# Patient Record
Sex: Male | Born: 1935
Health system: Southern US, Community
[De-identification: ages and names within clinical notes are randomized; demographics above are authoritative.]

## PROBLEM LIST (undated history)

## (undated) DIAGNOSIS — R011 Cardiac murmur, unspecified: Secondary | ICD-10-CM

## (undated) DIAGNOSIS — N189 Chronic kidney disease, unspecified: Secondary | ICD-10-CM

## (undated) DIAGNOSIS — I10 Essential (primary) hypertension: Secondary | ICD-10-CM

## (undated) DIAGNOSIS — D649 Anemia, unspecified: Secondary | ICD-10-CM

## (undated) DIAGNOSIS — I509 Heart failure, unspecified: Secondary | ICD-10-CM

## (undated) DIAGNOSIS — K648 Other hemorrhoids: Secondary | ICD-10-CM

## (undated) DIAGNOSIS — H409 Unspecified glaucoma: Secondary | ICD-10-CM

## (undated) DIAGNOSIS — R351 Nocturia: Secondary | ICD-10-CM

## (undated) DIAGNOSIS — I4819 Other persistent atrial fibrillation: Secondary | ICD-10-CM

## (undated) DIAGNOSIS — Z8719 Personal history of other diseases of the digestive system: Secondary | ICD-10-CM

## (undated) DIAGNOSIS — D539 Nutritional anemia, unspecified: Secondary | ICD-10-CM

## (undated) DIAGNOSIS — S83209A Unspecified tear of unspecified meniscus, current injury, unspecified knee, initial encounter: Secondary | ICD-10-CM

## (undated) DIAGNOSIS — E119 Type 2 diabetes mellitus without complications: Secondary | ICD-10-CM

## (undated) DIAGNOSIS — N400 Enlarged prostate without lower urinary tract symptoms: Secondary | ICD-10-CM

## (undated) DIAGNOSIS — K219 Gastro-esophageal reflux disease without esophagitis: Secondary | ICD-10-CM

## (undated) DIAGNOSIS — M25561 Pain in right knee: Secondary | ICD-10-CM

## (undated) DIAGNOSIS — R269 Unspecified abnormalities of gait and mobility: Principal | ICD-10-CM

## (undated) DIAGNOSIS — E785 Hyperlipidemia, unspecified: Secondary | ICD-10-CM

## (undated) DIAGNOSIS — D472 Monoclonal gammopathy: Secondary | ICD-10-CM

## (undated) DIAGNOSIS — M1990 Unspecified osteoarthritis, unspecified site: Secondary | ICD-10-CM

## (undated) DIAGNOSIS — K5790 Diverticulosis of intestine, part unspecified, without perforation or abscess without bleeding: Secondary | ICD-10-CM

## (undated) DIAGNOSIS — K635 Polyp of colon: Secondary | ICD-10-CM

## (undated) HISTORY — DX: Unspecified glaucoma: H40.9

## (undated) HISTORY — PX: HEMORRHOID SURGERY: SHX153

## (undated) HISTORY — DX: Unspecified abnormalities of gait and mobility: R26.9

## (undated) HISTORY — DX: Polyp of colon: K63.5

## (undated) HISTORY — DX: Cardiac murmur, unspecified: R01.1

## (undated) HISTORY — PX: HERNIA REPAIR: SHX51

## (undated) HISTORY — DX: Hyperlipidemia, unspecified: E78.5

---

## 2003-04-08 ENCOUNTER — Encounter: Admission: RE | Admit: 2003-04-08 | Discharge: 2003-04-08 | Payer: Self-pay | Admitting: Internal Medicine

## 2006-01-01 ENCOUNTER — Ambulatory Visit: Payer: Self-pay | Admitting: Internal Medicine

## 2006-01-24 ENCOUNTER — Ambulatory Visit: Payer: Self-pay | Admitting: Internal Medicine

## 2006-01-24 ENCOUNTER — Encounter (INDEPENDENT_AMBULATORY_CARE_PROVIDER_SITE_OTHER): Payer: Self-pay | Admitting: Specialist

## 2006-02-27 ENCOUNTER — Ambulatory Visit: Payer: Self-pay | Admitting: Internal Medicine

## 2006-12-27 ENCOUNTER — Ambulatory Visit (HOSPITAL_COMMUNITY): Admission: RE | Admit: 2006-12-27 | Discharge: 2006-12-27 | Payer: Self-pay | Admitting: Internal Medicine

## 2009-12-25 ENCOUNTER — Emergency Department (HOSPITAL_COMMUNITY)
Admission: EM | Admit: 2009-12-25 | Discharge: 2009-12-25 | Payer: Self-pay | Source: Home / Self Care | Admitting: Emergency Medicine

## 2010-04-02 HISTORY — PX: CATARACT EXTRACTION W/ INTRAOCULAR LENS  IMPLANT, BILATERAL: SHX1307

## 2010-08-18 NOTE — Assessment & Plan Note (Signed)
[place] OFFICE NOTE   Adam [name], Adam [name]                        MRN:          [id number]  DATE:02/27/2006                            DOB:          04-Feb-1931    HISTORY:  Adam [name] presents today for a followup.  He was evaluated in  the office January 01, 2006 for dysphagia and a personal history of colon  polyps.  See that dictation for details.  On January 24, 2006, he  underwent colonoscopy and upper endoscopy.  Colonoscopy revealed left  sided diverticulosis, small internal hemorrhoids, and a diminutive left  sided colon polyp which was removed and found to be hyperplastic.  Upper  endoscopy revealed a dense stricture of the distal esophagus secondary  to reflux disease, sliding hiatal hernia, and duodenal bulb erythema.  Biopsies for Helicobacter pylori testing were negative.  The esophageal  stricture was dilated to a maximal diameter of 15 mm.  He was prescribed  Prilosec 20 mg daily.  He took the medication for 2 weeks.  Discontinued  thereafter due to label recommendations.  He presents now for followup.  Since his procedure, he reports doing well.  Absolutely no dysphagia.  We did recommend repeat endoscopy to dilate his esophagus to a larger  diameter in order to improve his opportunity for a dysphagia-free  interval.  During today's office visit we discussed in great detail his  procedure results.  As well, treatment plans and recommendations.  He  has specific questions regarding his hemorrhoids, need for acid  suppressive medication, type of acid suppression medication and options,  and the need for repeat endoscopy.   ALLERGIES:  He has no known drug allergies.   MEDICATIONS:  Remain lisinopril, hydrochlorothiazide, Zocor, Mobic,  Atenolol, Simvastatin, vitamin C, Ocuvite, glucosamine, flax seed oil,  and fish oil.   PHYSICAL EXAMINATION:  Well-appearing male in no acute distress.   Blood  pressure is 110/62, heart rate 60 and regular, weight is 161 pounds.  His abdomen was not reexamined.   IMPRESSION:  1. Gastroesophageal reflux disease complicated by peptic stricture.      Currently asymptomatic post dilation.  2. Dimunitive hyperplastic colon polyp.  3. Incidental diverticulosis.  4. Internal hemorrhoids.   RECOMMENDATIONS:  1. Resume daily proton pump inhibitor therapy.  I have given him a      prescription for Nexium with multiple refills.  As well, a list of      multiple alternative proton pump inhibitors should he find one that      is more cost effective based on his insurance plan.  2. The patient has elected to forego esophageal dilation at this time.      Thus, repeat endoscopy with esophageal dilation as needed for      recurrent dysphagia.  3. Reflux precautions.  4. Daily fiber supplement for hemorrhoids.  5. Repeat colonoscopy for problems only, as his polyp was      hyperplastic.  6. Resume general medical care with Dr. Virgina Jock.     Docia Chuck. Henrene Pastor, MD  Electronically Signed    JNP/MedQ  DD: 02/27/2006  DT: 02/27/2006  Job #: [id number]   cc:   Adam Reel, MD

## 2010-08-18 NOTE — Assessment & Plan Note (Signed)
[place] OFFICE NOTE   Adam [name], SANCHES                        MRN:          [id number]  DATE:01/01/2006                            DOB:          06/13/1930    REASON FOR CONSULTATION:  1. Dysphagia.  2. Personal history of colon polyps, overdue for surveillance.   HISTORY OF PRESENT ILLNESS:  This is a pleasant 77 year old white male with  a history of hypertension, hyperlipidemia and diet controlled diabetes  mellitus.  He is referred through the courtesy of Dr. Virgina Jock regarding  dysphagia and a personal history of colon polyps.  First, the patient  reports a several year history of intermittent solid food dysphagia.  Occurrences are not predictable, though have recurred with stable frequency  of once every other week.  In years gone by, he did have significant  problems with heartburn and indigestion.  More recently, his symptoms are  controlled with an improved diet and occasional Zantac.  He has not had  prior upper endoscopy.  Next, her reports a personal history of colon polyps  when he underwent his initial screening exam in April of 1999 while living  in [place].  He reports to me that he had several polyps, and that it was  recommended that he undergo colonoscopy in a few years.  The patient readily  admits that he is overdue for the exam.  Outside records are not available  for review.  He had no active lower GI complaints such as abdominal pain,  change in his bowel habits or bleeding.  He does have hemorrhoids which he  notices though states they are not problematic.   PAST MEDICAL HISTORY:  1. Hypertension.  2. Hyperlipidemia.  3. Glucose intolerance.  4. Questionable remote history of sarcoidosis.  5. History of colon polyps.   PAST SURGICAL HISTORY:  1. Hernia repair.  2. Hemorrhoidectomy.   ALLERGIES:  No known drug allergies.   MEDICATIONS:  1. Lisinopril 40 mg daily.  2.  Hydrochlorothiazide 25 mg daily.  3. Zocor 40 mg daily.  4. Mobic 7.5 mg daily.  5. Atenolol 25 mg daily.  6. Simvastatin 40 mg daily.  7. Vitamin C 1 g daily.  8. Ocuvite daily.  9. Glucosamine daily.  10.Flax seed oil.  11.Fish oil.  12.Nystatin ointment p.r.n.   FAMILY HISTORY:  No family history of colon cancer or colon polyps.  Father  and brother with heart disease.  Brother with diabetes.   SOCIAL HISTORY:  The patient is married with two daughters and lives with  his wife. 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  He retired at a level of  06.  He does not smoke and has a couple of alcoholic beverages each evening.   REVIEW OF SYSTEMS:  Per diagnostic evaluation form.   PHYSICAL EXAMINATION:  GENERAL APPEARANCE:  Well-appearing male in no acute  distress.  VITAL SIGNS:  Blood pressure 120/70, heart rate 68 and regular, weight 162  pounds, height 5 feet 5 inches.  HEENT:  Sclerae anicteric.  Conjunctivae pink.  Oral  mucosa intact.  No  adenopathy.  LUNGS:  Clear.  HEART:  Regular.  ABDOMEN:  Soft without tenderness mass or hernia.  Good bowel sounds heard.  EXTREMITIES:  Without edema.   IMPRESSION:  1. Several year history of stable intermittent solid food dysphagia.  This      on a background of reflux symptoms, most likely peptic stricture.  2. Personal history of colon polyps in 1999.  Presumably overdue for      surveillance.  No active symptoms at present.   RECOMMENDATIONS:  1. Upper endoscopy with possible esophageal dilation.  Petra Kuba of the      procedure as well as risks, benefits, alternatives were reviewed.  He      understood and agreed to proceed.  2. Consider proton pump inhibitor therapy if reflux sound is a cause for      dysphagia.  3. Schedule colonoscopy with polypectomy if necessary.  Petra Kuba of the      procedure as well as      risks, benefits, alternatives were discussed in detail.  Again, he      understood and agreed to proceed.            ______________________________  Docia Chuck. Geri Seminole., MD    JNP/MedQ  DD:  01/01/2006 DT:  01/02/2006 Job #:  [id number]   cc:   Precious Reel, MD

## 2011-01-24 ENCOUNTER — Encounter: Payer: Self-pay | Admitting: Internal Medicine

## 2011-04-05 DIAGNOSIS — M109 Gout, unspecified: Secondary | ICD-10-CM | POA: Diagnosis not present

## 2011-06-06 DIAGNOSIS — I10 Essential (primary) hypertension: Secondary | ICD-10-CM | POA: Diagnosis not present

## 2011-06-06 DIAGNOSIS — E1129 Type 2 diabetes mellitus with other diabetic kidney complication: Secondary | ICD-10-CM | POA: Diagnosis not present

## 2011-06-06 DIAGNOSIS — M109 Gout, unspecified: Secondary | ICD-10-CM | POA: Diagnosis not present

## 2011-09-07 DIAGNOSIS — E785 Hyperlipidemia, unspecified: Secondary | ICD-10-CM | POA: Diagnosis not present

## 2011-09-07 DIAGNOSIS — M109 Gout, unspecified: Secondary | ICD-10-CM | POA: Diagnosis not present

## 2011-09-07 DIAGNOSIS — Z125 Encounter for screening for malignant neoplasm of prostate: Secondary | ICD-10-CM | POA: Diagnosis not present

## 2011-09-07 DIAGNOSIS — I10 Essential (primary) hypertension: Secondary | ICD-10-CM | POA: Diagnosis not present

## 2011-09-07 DIAGNOSIS — E1129 Type 2 diabetes mellitus with other diabetic kidney complication: Secondary | ICD-10-CM | POA: Diagnosis not present

## 2011-09-07 DIAGNOSIS — D649 Anemia, unspecified: Secondary | ICD-10-CM | POA: Diagnosis not present

## 2011-09-18 DIAGNOSIS — E119 Type 2 diabetes mellitus without complications: Secondary | ICD-10-CM | POA: Diagnosis not present

## 2011-09-18 DIAGNOSIS — H3530 Unspecified macular degeneration: Secondary | ICD-10-CM | POA: Diagnosis not present

## 2011-09-18 DIAGNOSIS — Z961 Presence of intraocular lens: Secondary | ICD-10-CM | POA: Diagnosis not present

## 2011-09-21 DIAGNOSIS — Z125 Encounter for screening for malignant neoplasm of prostate: Secondary | ICD-10-CM | POA: Diagnosis not present

## 2011-09-21 DIAGNOSIS — N183 Chronic kidney disease, stage 3 unspecified: Secondary | ICD-10-CM | POA: Diagnosis not present

## 2011-09-21 DIAGNOSIS — E785 Hyperlipidemia, unspecified: Secondary | ICD-10-CM | POA: Diagnosis not present

## 2011-09-21 DIAGNOSIS — E119 Type 2 diabetes mellitus without complications: Secondary | ICD-10-CM | POA: Diagnosis not present

## 2011-09-21 DIAGNOSIS — Z0000 Encounter for general adult medical examination without abnormal findings: Secondary | ICD-10-CM | POA: Diagnosis not present

## 2011-09-21 DIAGNOSIS — R7989 Other specified abnormal findings of blood chemistry: Secondary | ICD-10-CM | POA: Diagnosis not present

## 2011-09-26 DIAGNOSIS — I10 Essential (primary) hypertension: Secondary | ICD-10-CM | POA: Diagnosis not present

## 2011-09-26 DIAGNOSIS — E875 Hyperkalemia: Secondary | ICD-10-CM | POA: Diagnosis not present

## 2011-09-26 DIAGNOSIS — M549 Dorsalgia, unspecified: Secondary | ICD-10-CM | POA: Diagnosis not present

## 2011-09-26 DIAGNOSIS — N183 Chronic kidney disease, stage 3 unspecified: Secondary | ICD-10-CM | POA: Diagnosis not present

## 2011-10-02 ENCOUNTER — Telehealth: Payer: Self-pay | Admitting: Oncology

## 2011-10-02 NOTE — Telephone Encounter (Signed)
S/w pt wife re appt for 7/16 @ 1:30 pm.

## 2011-10-08 ENCOUNTER — Telehealth: Payer: Self-pay | Admitting: Oncology

## 2011-10-08 NOTE — Telephone Encounter (Signed)
Referred by Dr. Shon Baton Dx-Monoclonal Gammopathy

## 2011-10-12 ENCOUNTER — Other Ambulatory Visit: Payer: Self-pay | Admitting: Oncology

## 2011-10-12 DIAGNOSIS — D729 Disorder of white blood cells, unspecified: Secondary | ICD-10-CM

## 2011-10-12 DIAGNOSIS — D649 Anemia, unspecified: Secondary | ICD-10-CM

## 2011-10-16 ENCOUNTER — Ambulatory Visit (HOSPITAL_COMMUNITY)
Admission: RE | Admit: 2011-10-16 | Discharge: 2011-10-16 | Disposition: A | Discharge status: Home / Self Care | Payer: Medicare Other | Source: Ambulatory Visit | Attending: Oncology | Admitting: Oncology

## 2011-10-16 ENCOUNTER — Telehealth: Payer: Self-pay | Admitting: Oncology

## 2011-10-16 ENCOUNTER — Other Ambulatory Visit (HOSPITAL_BASED_OUTPATIENT_CLINIC_OR_DEPARTMENT_OTHER): Payer: Medicare Other | Admitting: Lab

## 2011-10-16 ENCOUNTER — Encounter: Payer: Self-pay | Admitting: Oncology

## 2011-10-16 ENCOUNTER — Ambulatory Visit: Payer: Medicare Other

## 2011-10-16 ENCOUNTER — Ambulatory Visit (HOSPITAL_BASED_OUTPATIENT_CLINIC_OR_DEPARTMENT_OTHER): Payer: Medicare Other | Admitting: Oncology

## 2011-10-16 VITALS — BP 143/60 | HR 69 | Temp 97.7°F | Ht 64.5 in | Wt 152.1 lb

## 2011-10-16 DIAGNOSIS — D649 Anemia, unspecified: Secondary | ICD-10-CM | POA: Diagnosis not present

## 2011-10-16 DIAGNOSIS — M47812 Spondylosis without myelopathy or radiculopathy, cervical region: Secondary | ICD-10-CM | POA: Diagnosis not present

## 2011-10-16 DIAGNOSIS — C9000 Multiple myeloma not having achieved remission: Secondary | ICD-10-CM | POA: Diagnosis not present

## 2011-10-16 DIAGNOSIS — D472 Monoclonal gammopathy: Secondary | ICD-10-CM

## 2011-10-16 DIAGNOSIS — Z8739 Personal history of other diseases of the musculoskeletal system and connective tissue: Secondary | ICD-10-CM | POA: Diagnosis not present

## 2011-10-16 DIAGNOSIS — D729 Disorder of white blood cells, unspecified: Secondary | ICD-10-CM

## 2011-10-16 LAB — CBC WITH DIFFERENTIAL/PLATELET
BASO%: 0.1 % (ref 0.0–2.0)
Basophils Absolute: 0 10*3/uL (ref 0.0–0.1)
EOS%: 3.7 % (ref 0.0–7.0)
Eosinophils Absolute: 0.1 10*3/uL (ref 0.0–0.5)
HCT: 30 % — ABNORMAL LOW (ref 38.4–49.9)
HGB: 10.1 g/dL — ABNORMAL LOW (ref 13.0–17.1)
LYMPH%: 24.7 % (ref 14.0–49.0)
MCH: 34.9 pg — ABNORMAL HIGH (ref 27.2–33.4)
MCHC: 33.6 g/dL (ref 32.0–36.0)
MCV: 103.9 fL — ABNORMAL HIGH (ref 79.3–98.0)
MONO#: 0.5 10*3/uL (ref 0.1–0.9)
MONO%: 13 % (ref 0.0–14.0)
NEUT#: 2.3 10*3/uL (ref 1.5–6.5)
NEUT%: 58.5 % (ref 39.0–75.0)
Platelets: 178 10*3/uL (ref 140–400)
RBC: 2.88 10*6/uL — ABNORMAL LOW (ref 4.20–5.82)
RDW: 13.3 % (ref 11.0–14.6)
WBC: 3.9 10*3/uL — ABNORMAL LOW (ref 4.0–10.3)
lymph#: 1 10*3/uL (ref 0.9–3.3)

## 2011-10-16 LAB — CHCC SMEAR

## 2011-10-16 NOTE — Progress Notes (Signed)
CC:   Adam Reel, MD  REASON FOR CONSULTATION:  Evaluate for plasma cell disorder.  HISTORY OF PRESENT ILLNESS:  A 78 year old gentleman, native of [place], lived in multiple locations in the past as part of his career in the [employer].  He relocated to [place] 9 years ago from [place] to live close to his daughter.  He is a gentleman with a past medical history significant for severe gout.  He has also A history of chronic renal insufficiency and mild anemia.  At 1 point, he was followed by Hematology/Oncology by Adam [name] at the [facility], phone number [phone], for what appears to be a plasma cell disorder.  At that time, he was followed by laboratory blood work.  He does not recall having a bone marrow biopsy and basically he was told that he did not have a multiple myeloma but needs to be followed up.  The patient has not really followed with them or with anybody, from that matter, since his relocation to [place].  He has been monitored closely by Adam [name], who recently repeated his protein studies among other laboratory data.  On 06/21 his hemoglobin was 9.2, his platelet count was 223, and his white cell count of 4.3.  So electrolytes are normal.  His potassium is 5.5.  His creatinine was 1.8.  His protein studies at that time showed that he did not really have an M spike detected; however, immunofixation showed an IgM kappa light chain.  His IgG, IgA and IgM quantitative immunoglobulins are all within normal range.  Iron level was normal at 112.  His [id number] and folic acid within normal range as well. For that reason, the patient was referred to me for evaluation for possible plasma cell disorder and recent worsening anemia.  Clinically Adam [name] is asymptomatic.  He does not report any chest pain.  He does not report any difficulty breathing.  He had reported some fatigue at times.  He had not reported any back pain.  Did not report any shoulder pain.   He had not had any thrombotic events.  He had not had any major changes in his performance status or activity level.  He is still quite active, performing again most activities of daily living without any hindrance or decline.  REVIEW OF SYSTEMS:  Does not report any headaches, blurry vision, double vision.  Does not report motor or sensory neuropathy.  Did not report any alteration in mental status.  Did not report any psychiatric issues, depression.  Did not report any fever, chills, sweats.  Did not report any cough, hemoptysis, hematemesis.  No nausea, vomiting.  Did not report any abdominal pain.  No hematochezia, melena, genitourinary complaints.  Rest of the review of systems unremarkable.  PAST MEDICAL HISTORY:  Significant for gout.  He has history of questionable plasma cell disorder, history of hyperlipidemia, hypertension, macular degeneration, arthritis and also status post cataract surgery.  He has also diabetes but diet control at this time.  MEDICATION:  He is on lisinopril, labetalol, Benicar, spironolactone, MiraLAX, Colcrys, simvastatin, Nexium, Avodart.  He takes also over-the- counter fish oil, vitamin C, Ocuvite and glucosamine/chondroitin.  ALLERGIES:  None.  FAMILY HISTORY:  His 1 sister had multiple myeloma and died 24 years ago.  Mother lived to the 14s.  Father had heart problems.  SOCIAL HISTORY:  He is married.  He has 2 daughters.  Quit smoking in the 1960s.  Drinks about 2 drinks a night, done  so for the last 25 years.  He had a career in [employer], also was a Network engineer.  PHYSICAL EXAMINATION:  An alert, awake gentleman, appeared in no active distress.  His blood pressure is 143/60, pulse 69, respirations 20, and temperature is 97.7.  Head:  Normocephalic, atraumatic.  Pupils equal and round, reactive to light.  Oral mucosa moist and pink.  Neck: Supple.  No lymphadenopathy.  Heart:  Regular rate and rhythm.  S1 and S2.  Lungs:  Clear  to auscultation.  No rhonchi, wheeze or dullness to percussion.  Abdomen:  Soft, nontender.  No hepatosplenomegaly. Extremities:  No clubbing, cyanosis, or edema.  Neurologic:  Intact motor sensory and deep tendon reflexes.  LABORATORY DATA:  Hemoglobin of 10.1, white cell count is 3.9, platelet count of 178, MCV is 103.9.  Peripheral smear was personally reviewed today.  His red cells appeared normal.  Could not appreciate any schistocytosis or red cell fragmentation.  White cells appeared normal. No evidence of any hypogranulation or dysplasia.  ASSESSMENT AND PLAN:  A 78 year old gentleman with the following issues: 1. Plasma cell disorder.  He does have an element of a monoclonal     protein, although his M spike is really very small until only     detected by immunofixation.  His quantitative immunoglobulin is low     normal but definitely has an IgM monoclonal protein with a kappa     light chain restriction.  Differential diagnosis was discussed     today with Adam [name].  That would include monoclonal gammopathy of     undetermined significance, IgM multiple myeloma is extremely rare     but certainly a possibility, and lastly a lymphoproliferative     disorder.  A number of conditions such as non-Hodgkin lymphoma,     like a Waldenstrom macroglobulinemia could have an isolated IgM.     Overall, I do agree with Adam [name] that I think it is less likely a     plasma cell disorder or lymphoproliferative disorder.  At this     time, I think his M spike and the fact that he has completely     normal IgM makes me think less of a neoplasm causing that IgM     monoclonal protein.  It could also be an element of a reactive     monoclonal gammopathy. 2. Anemia.  His hemoglobin is 10, lower limit of normal is 13, with     macrocytosis.  Differential diagnosis associated with that, could     have anemia of renal disease due to his chronic renal     insufficiency.  Could also have an  element of myelodysplastic     syndrome.  Also this could be a function of a lymphoproliferative     disorder that is causing his IgM monoclonal protein.  The workup at     this point:  We will obtain a skeletal survey to rule out a     multiple myeloma and we will obtain some light chains as well.  I     have also discussed with him the possible need for a bone marrow     biopsy if we cannot really determine exactly why he has microcytic     anemia.  The odds are he could have an element of myelodysplastic     syndrome that might require growth factor support in the near     future.  I will ask Mr.  Goodpasture to return back to me after we have     done these studies.    ______________________________ Wyatt Portela, M.D. FNS/MEDQ  D:  10/16/2011  T:  10/16/2011  Job:  [id number]

## 2011-10-16 NOTE — Progress Notes (Signed)
Note dictated

## 2011-10-16 NOTE — Telephone Encounter (Signed)
Gave pt appt for August 1st MD visit only, sent pt for Bone survey today

## 2011-10-16 NOTE — Progress Notes (Signed)
Patient came in today as a new patient and he has two insurance,he said that he was [place] as far as financial assistance.

## 2011-10-18 LAB — COMPREHENSIVE METABOLIC PANEL
ALT: 40 U/L (ref 0–53)
AST: 39 U/L — ABNORMAL HIGH (ref 0–37)
Albumin: 4.4 g/dL (ref 3.5–5.2)
Alkaline Phosphatase: 61 U/L (ref 39–117)
BUN: 50 mg/dL — ABNORMAL HIGH (ref 6–23)
CO2: 20 mEq/L (ref 19–32)
Calcium: 9.3 mg/dL (ref 8.4–10.5)
Chloride: 108 mEq/L (ref 96–112)
Creatinine, Ser: 1.95 mg/dL — ABNORMAL HIGH (ref 0.50–1.35)
Glucose, Bld: 116 mg/dL — ABNORMAL HIGH (ref 70–99)
Potassium: 6.2 mEq/L — ABNORMAL HIGH (ref 3.5–5.3)
Sodium: 136 mEq/L (ref 135–145)
Total Bilirubin: 0.4 mg/dL (ref 0.3–1.2)
Total Protein: 6.5 g/dL (ref 6.0–8.3)

## 2011-10-18 LAB — SPEP & IFE WITH QIG
Albumin ELP: 62.6 % (ref 55.8–66.1)
Alpha-1-Globulin: 4 % (ref 2.9–4.9)
Alpha-2-Globulin: 10.2 % (ref 7.1–11.8)
Beta 2: 4.6 % (ref 3.2–6.5)
Beta Globulin: 6.6 % (ref 4.7–7.2)
Gamma Globulin: 12 % (ref 11.1–18.8)
IgA: 272 mg/dL (ref 68–379)
IgG (Immunoglobin G), Serum: 858 mg/dL (ref 650–1600)
IgM, Serum: 184 mg/dL (ref 41–251)
Total Protein, Serum Electrophoresis: 6.5 g/dL (ref 6.0–8.3)

## 2011-10-18 LAB — KAPPA/LAMBDA LIGHT CHAINS
Kappa free light chain: 6.01 mg/dL — ABNORMAL HIGH (ref 0.33–1.94)
Kappa:Lambda Ratio: 1.88 — ABNORMAL HIGH (ref 0.26–1.65)
Lambda Free Lght Chn: 3.2 mg/dL — ABNORMAL HIGH (ref 0.57–2.63)

## 2011-10-18 LAB — ERYTHROPOIETIN: Erythropoietin: 4.1 m[IU]/mL (ref 2.6–34.0)

## 2011-11-01 ENCOUNTER — Ambulatory Visit (HOSPITAL_BASED_OUTPATIENT_CLINIC_OR_DEPARTMENT_OTHER): Payer: Medicare Other | Admitting: Oncology

## 2011-11-01 ENCOUNTER — Telehealth: Payer: Self-pay | Admitting: Oncology

## 2011-11-01 VITALS — BP 144/64 | HR 61 | Temp 97.0°F | Ht 65.4 in | Wt 152.6 lb

## 2011-11-01 DIAGNOSIS — D539 Nutritional anemia, unspecified: Secondary | ICD-10-CM | POA: Diagnosis not present

## 2011-11-01 DIAGNOSIS — D472 Monoclonal gammopathy: Secondary | ICD-10-CM

## 2011-11-01 DIAGNOSIS — D729 Disorder of white blood cells, unspecified: Secondary | ICD-10-CM

## 2011-11-01 NOTE — Progress Notes (Signed)
Hematology and Oncology Follow Up Visit  Adam [name] [id number] [birth year] 78 y.o. 11/01/2011 4:46 PM    Principle Diagnosis: 78 year old with anemia and IgM monoclonal gammopathy. Likely due to MDS vs possible lymphoproliferative disorder.   Current therapy: observation and surveillance.   Interim History:  Adam [name] presents today for a follow up visit. His a pleasant man I saw on 7/16 for anemia and IgM M spike. He is asymptomatic at this point. He does not report any chest pain. He does not report any difficulty breathing. He had reported some fatigue at times. He had not reported any back pain. Did not report any shoulder pain. He had not had any thrombotic events. He had not had any major changes in his performance status or activity level. He is still quite active, performing again most activities of daily living without any hindrance or decline.   Medications: I have reviewed the patient's current medications. Current outpatient prescriptions:Ascorbic Acid (VITAMIN C) 1000 MG tablet, Take 1,000 mg by mouth daily., Disp: , Rfl: ;  beta carotene w/minerals (OCUVITE) tablet, Take 1 tablet by mouth daily., Disp: , Rfl: ;  colchicine (COLCRYS) 0.6 MG tablet, Take 0.6 mg by mouth daily., Disp: , Rfl: ;  dutasteride (AVODART) 0.5 MG capsule, Take 0.5 mg by mouth daily., Disp: , Rfl:  esomeprazole (NEXIUM) 40 MG capsule, Take 40 mg by mouth daily before breakfast., Disp: , Rfl: ;  Febuxostat (ULORIC) 80 MG TABS, Take 80 mg by mouth daily., Disp: , Rfl: ;  Flaxseed, Linseed, (FLAXSEED OIL) 1000 MG CAPS, Take 2,000 mg by mouth daily., Disp: , Rfl: ;  glucosamine-chondroitin 500-400 MG tablet, Take 1 tablet by mouth daily., Disp: , Rfl: ;  labetalol (NORMODYNE) 100 MG tablet, Take 100 mg by mouth 2 (two) times daily., Disp: , Rfl:  lisinopril (PRINIVIL,ZESTRIL) 40 MG tablet, Take 40 mg by mouth daily., Disp: , Rfl: ;  olmesartan (BENICAR) 40 MG tablet, Take 40 mg by mouth daily., Disp: , Rfl: ;  Omega-3  Fatty Acids (FISH OIL) 1000 MG CAPS, Take 1,000 mg by mouth 3 (three) times daily., Disp: , Rfl: ;  simvastatin (ZOCOR) 40 MG tablet, Take 40 mg by mouth every evening., Disp: , Rfl: ;  spironolactone (ALDACTONE) 25 MG tablet, Take 25 mg by mouth daily., Disp: , Rfl:   Allergies: No Known Allergies  Past Medical History, Surgical history, Social history, and Family History were reviewed and updated.  Review of Systems: Constitutional:  Negative for fever, chills, night sweats, anorexia, weight loss, pain. Cardiovascular: no chest pain or dyspnea on exertion Respiratory: negative Neurological: negative Dermatological: negative ENT: negative Skin: Negative. Gastrointestinal: negative Genito-Urinary: negative Hematological and Lymphatic: negative Breast: negative Musculoskeletal: negative Remaining ROS negative. Physical Exam: Blood pressure 144/64, pulse 61, temperature 97 F (36.1 C), temperature source Oral, height 5' 5.4" (1.661 m), weight 152 lb 9.6 oz (69.219 kg). ECOG: 1 General appearance: alert Head: Normocephalic, without obvious abnormality, atraumatic Neck: no adenopathy, no carotid bruit, no JVD, supple, symmetrical, trachea midline and thyroid not enlarged, symmetric, no tenderness/mass/nodules Lymph nodes: Cervical, supraclavicular, and axillary nodes normal. Heart:regular rate and rhythm, S1, S2 normal, no murmur, click, rub or gallop Lung:chest clear, no wheezing, rales, normal symmetric air entry Abdomin: soft, non-tender, without masses or organomegaly EXT:no erythema, induration, or nodules   Lab Results: Lab Results  Component Value Date   WBC 3.9* 10/16/2011   HGB 10.1* 10/16/2011   HCT 30.0* 10/16/2011   MCV 103.9* 10/16/2011  PLT 178 10/16/2011     Chemistry      Component Value Date/Time   NA 136 10/16/2011 1336   K 6.2* 10/16/2011 1336   CL 108 10/16/2011 1336   CO2 20 10/16/2011 1336   BUN 50* 10/16/2011 1336   CREATININE 1.95* 10/16/2011 1336        Component Value Date/Time   CALCIUM 9.3 10/16/2011 1336   ALKPHOS 61 10/16/2011 1336   AST 39* 10/16/2011 1336   ALT 40 10/16/2011 1336   BILITOT 0.4 10/16/2011 1336       Radiological Studies: METASTATIC BONE SURVEY  Comparison: None.  Findings: Lateral view of the skull demonstrates no focal lytic  lesion. AP and lateral views of the cervical spine demonstrate C4-  C6 to be borderline decreased in height. Favored to be within  normal variation. No lytic lesions. Expected for age spondylosis,  including at C5-C6 and C6-C7.  Swimmer's view demonstrates no findings at the cervical thoracic  junction or upper thoracic spine. AP and lateral views of the  thoracic spine demonstrate minimal S-shaped curvature.  Approximately the bottom of T3 through the bottom of L2 are imaged  on the lateral. This demonstrates spondylosis. Borderline mid  thoracic vertebral body height, including T8-T9. Also favored to  be within normal variation. No focal lesions. Spondylosis which  is most marked at T10-T12.  AP and lateral views of the lumbar spine demonstrate five lumbar-  type vertebral bodies. Vascular calcifications. Surgical clips  projecting over the left hip. Possible right renal calculus.  Maintenance of lumbar vertebral body height, without focal lytic  lesion.  AP view pelvis demonstrates no focal lytic lesion.  Vascular calcifications in the femoral distribution bilaterally.  AP views of the bilateral femurs demonstrate no focal lytic lesion.  AP views of the bilateral shoulders demonstrate bilateral  acromioclavicular joint osteoarthritis. No focal lytic lesion.  AP views of the bilateral humeri demonstrate no focal lytic lesion.  Minimal enthesopathic change at the medial epicondyle of the right  humerus.  AP views of the distal femurs demonstrate no focal lytic lesion.  Chondrocalcinosis identified within the menisci. Minimal.  IMPRESSION:  1. No focal lesions to suggest multiple  myeloma.  2. Subtle chondrocalcinosis within the menisci. This suggests  calcium pyrophosphate deposition disease.    Impression and Plan: 78 year old gentleman with the following issues:  1. Possible Plasma cell disorder. He does have an element of a monoclonal protein, although his M spike is really very small until only  detected by immunofixation. His quantitative immunoglobulins are normal.  Differential diagnosis  monoclonal gammopathy of undetermined significance, IgM multiple myeloma is extremely rare with a negative skeletal survey and lymphoproliferative like  Waldenstrom macroglobulinemia. For the time being, given his lack of symptoms, I will continue to observe.   2. Anemia. Differential diagnosis anemia of renal disease due to his chronic renal  insufficiency. Could also have an element of myelodysplastic syndrome. Also this could be a function of a lymphoproliferative  disorder that is causing his IgM monoclonal protein.  I have also discussed with him the possible need for a bone marrow  biopsy if we cannot really determine exactly why he has macrocytic anemia. The odds are he could have an element of myelodysplastic  syndrome that might require growth factor support in the near  future.  Mr. Rodrick elected to defer that to a future date. He is agreeable to proceed if his counts drop further or he becomes symptomatic.  I will repeat  his counts in 3 months.      Zola Button, MD 8/1/20134:46 PM

## 2011-11-01 NOTE — Telephone Encounter (Signed)
Gave pt appt for November 2013 lab and MD \

## 2011-11-26 DIAGNOSIS — M25569 Pain in unspecified knee: Secondary | ICD-10-CM | POA: Diagnosis not present

## 2011-11-26 DIAGNOSIS — M1A00X0 Idiopathic chronic gout, unspecified site, without tophus (tophi): Secondary | ICD-10-CM | POA: Diagnosis not present

## 2011-11-26 DIAGNOSIS — M1710 Unilateral primary osteoarthritis, unspecified knee: Secondary | ICD-10-CM | POA: Diagnosis not present

## 2011-11-29 DIAGNOSIS — M1710 Unilateral primary osteoarthritis, unspecified knee: Secondary | ICD-10-CM | POA: Diagnosis not present

## 2011-11-29 DIAGNOSIS — M25569 Pain in unspecified knee: Secondary | ICD-10-CM | POA: Diagnosis not present

## 2011-11-29 DIAGNOSIS — M1A00X0 Idiopathic chronic gout, unspecified site, without tophus (tophi): Secondary | ICD-10-CM | POA: Diagnosis not present

## 2011-12-05 DIAGNOSIS — M1710 Unilateral primary osteoarthritis, unspecified knee: Secondary | ICD-10-CM | POA: Diagnosis not present

## 2011-12-05 DIAGNOSIS — M25569 Pain in unspecified knee: Secondary | ICD-10-CM | POA: Diagnosis not present

## 2011-12-05 DIAGNOSIS — M1A00X0 Idiopathic chronic gout, unspecified site, without tophus (tophi): Secondary | ICD-10-CM | POA: Diagnosis not present

## 2011-12-08 DIAGNOSIS — M1710 Unilateral primary osteoarthritis, unspecified knee: Secondary | ICD-10-CM | POA: Diagnosis not present

## 2011-12-11 DIAGNOSIS — Z23 Encounter for immunization: Secondary | ICD-10-CM | POA: Diagnosis not present

## 2011-12-13 DIAGNOSIS — M1710 Unilateral primary osteoarthritis, unspecified knee: Secondary | ICD-10-CM | POA: Diagnosis not present

## 2011-12-18 DIAGNOSIS — M1710 Unilateral primary osteoarthritis, unspecified knee: Secondary | ICD-10-CM | POA: Diagnosis not present

## 2011-12-20 ENCOUNTER — Encounter (HOSPITAL_BASED_OUTPATIENT_CLINIC_OR_DEPARTMENT_OTHER): Payer: Self-pay | Admitting: *Deleted

## 2011-12-20 NOTE — Progress Notes (Signed)
NPO AFTER MN. ARRIVES AT 1030. NEEDS EKG. PT TO GET LAB DONE Friday 12-21-2011 OR Monday 12-24-2011. WILL TAKE LABETOLOL AM OF SURG W/ SIP OF WATER.

## 2011-12-21 DIAGNOSIS — IMO0002 Reserved for concepts with insufficient information to code with codable children: Secondary | ICD-10-CM | POA: Diagnosis not present

## 2011-12-21 DIAGNOSIS — M1120 Other chondrocalcinosis, unspecified site: Secondary | ICD-10-CM | POA: Diagnosis not present

## 2011-12-21 DIAGNOSIS — I10 Essential (primary) hypertension: Secondary | ICD-10-CM | POA: Diagnosis not present

## 2011-12-21 DIAGNOSIS — S83289A Other tear of lateral meniscus, current injury, unspecified knee, initial encounter: Secondary | ICD-10-CM | POA: Diagnosis not present

## 2011-12-21 DIAGNOSIS — Z79899 Other long term (current) drug therapy: Secondary | ICD-10-CM | POA: Diagnosis not present

## 2011-12-21 DIAGNOSIS — E119 Type 2 diabetes mellitus without complications: Secondary | ICD-10-CM | POA: Diagnosis not present

## 2011-12-21 DIAGNOSIS — E7800 Pure hypercholesterolemia, unspecified: Secondary | ICD-10-CM | POA: Diagnosis not present

## 2011-12-21 LAB — CBC
HCT: 30 % — ABNORMAL LOW (ref 39.0–52.0)
Hemoglobin: 10.5 g/dL — ABNORMAL LOW (ref 13.0–17.0)
MCH: 33.5 pg (ref 26.0–34.0)
MCHC: 35 g/dL (ref 30.0–36.0)
MCV: 95.8 fL (ref 78.0–100.0)
Platelets: 169 10*3/uL (ref 150–400)
RBC: 3.13 MIL/uL — ABNORMAL LOW (ref 4.22–5.81)
RDW: 12.8 % (ref 11.5–15.5)
WBC: 3.7 10*3/uL — ABNORMAL LOW (ref 4.0–10.5)

## 2011-12-21 LAB — COMPREHENSIVE METABOLIC PANEL
ALT: 56 U/L — ABNORMAL HIGH (ref 0–53)
AST: 40 U/L — ABNORMAL HIGH (ref 0–37)
Albumin: 3.7 g/dL (ref 3.5–5.2)
Alkaline Phosphatase: 63 U/L (ref 39–117)
BUN: 31 mg/dL — ABNORMAL HIGH (ref 6–23)
CO2: 20 mEq/L (ref 19–32)
Calcium: 9.3 mg/dL (ref 8.4–10.5)
Chloride: 104 mEq/L (ref 96–112)
Creatinine, Ser: 1.67 mg/dL — ABNORMAL HIGH (ref 0.50–1.35)
GFR calc Af Amer: 43 mL/min — ABNORMAL LOW (ref >90)
GFR calc non Af Amer: 37 mL/min — ABNORMAL LOW (ref >90)
Glucose, Bld: 124 mg/dL — ABNORMAL HIGH (ref 70–99)
Potassium: 6.2 mEq/L — ABNORMAL HIGH (ref 3.5–5.1)
Sodium: 132 mEq/L — ABNORMAL LOW (ref 135–145)
Total Bilirubin: 0.3 mg/dL (ref 0.3–1.2)
Total Protein: 6.5 g/dL (ref 6.0–8.3)

## 2011-12-21 LAB — PROTIME-INR
INR: 1.03 (ref 0.00–1.49)
Prothrombin Time: 13.4 seconds (ref 11.6–15.2)

## 2011-12-21 LAB — APTT: aPTT: 32 seconds (ref 24–37)

## 2011-12-25 ENCOUNTER — Other Ambulatory Visit: Payer: Self-pay

## 2011-12-25 ENCOUNTER — Ambulatory Visit (HOSPITAL_BASED_OUTPATIENT_CLINIC_OR_DEPARTMENT_OTHER)
Admission: RE | Admit: 2011-12-25 | Discharge: 2011-12-25 | Disposition: A | Discharge status: Home / Self Care | Payer: Medicare Other | Source: Ambulatory Visit | Attending: Orthopedic Surgery | Admitting: Orthopedic Surgery

## 2011-12-25 ENCOUNTER — Encounter (HOSPITAL_BASED_OUTPATIENT_CLINIC_OR_DEPARTMENT_OTHER)
Admission: RE | Disposition: A | Discharge status: Home / Self Care | Payer: Self-pay | Source: Ambulatory Visit | Attending: Orthopedic Surgery

## 2011-12-25 ENCOUNTER — Encounter (HOSPITAL_BASED_OUTPATIENT_CLINIC_OR_DEPARTMENT_OTHER): Payer: Self-pay

## 2011-12-25 ENCOUNTER — Encounter (HOSPITAL_BASED_OUTPATIENT_CLINIC_OR_DEPARTMENT_OTHER): Payer: Self-pay | Admitting: Anesthesiology

## 2011-12-25 ENCOUNTER — Ambulatory Visit (HOSPITAL_BASED_OUTPATIENT_CLINIC_OR_DEPARTMENT_OTHER): Payer: Medicare Other | Admitting: Anesthesiology

## 2011-12-25 DIAGNOSIS — M2240 Chondromalacia patellae, unspecified knee: Secondary | ICD-10-CM | POA: Diagnosis not present

## 2011-12-25 DIAGNOSIS — M23349 Other meniscus derangements, anterior horn of lateral meniscus, unspecified knee: Secondary | ICD-10-CM

## 2011-12-25 DIAGNOSIS — M1120 Other chondrocalcinosis, unspecified site: Secondary | ICD-10-CM | POA: Diagnosis not present

## 2011-12-25 DIAGNOSIS — IMO0002 Reserved for concepts with insufficient information to code with codable children: Secondary | ICD-10-CM | POA: Insufficient documentation

## 2011-12-25 DIAGNOSIS — M1711 Unilateral primary osteoarthritis, right knee: Secondary | ICD-10-CM | POA: Diagnosis present

## 2011-12-25 DIAGNOSIS — I10 Essential (primary) hypertension: Secondary | ICD-10-CM | POA: Insufficient documentation

## 2011-12-25 DIAGNOSIS — S83289A Other tear of lateral meniscus, current injury, unspecified knee, initial encounter: Secondary | ICD-10-CM | POA: Insufficient documentation

## 2011-12-25 DIAGNOSIS — M11869 Other specified crystal arthropathies, unspecified knee: Secondary | ICD-10-CM | POA: Diagnosis not present

## 2011-12-25 DIAGNOSIS — Z79899 Other long term (current) drug therapy: Secondary | ICD-10-CM | POA: Insufficient documentation

## 2011-12-25 DIAGNOSIS — X58XXXA Exposure to other specified factors, initial encounter: Secondary | ICD-10-CM | POA: Insufficient documentation

## 2011-12-25 DIAGNOSIS — E119 Type 2 diabetes mellitus without complications: Secondary | ICD-10-CM | POA: Insufficient documentation

## 2011-12-25 DIAGNOSIS — E7800 Pure hypercholesterolemia, unspecified: Secondary | ICD-10-CM | POA: Insufficient documentation

## 2011-12-25 DIAGNOSIS — M23359 Other meniscus derangements, posterior horn of lateral meniscus, unspecified knee: Secondary | ICD-10-CM | POA: Diagnosis not present

## 2011-12-25 DIAGNOSIS — M23205 Derangement of unspecified medial meniscus due to old tear or injury, unspecified knee: Secondary | ICD-10-CM | POA: Diagnosis present

## 2011-12-25 DIAGNOSIS — M23329 Other meniscus derangements, posterior horn of medial meniscus, unspecified knee: Secondary | ICD-10-CM | POA: Diagnosis not present

## 2011-12-25 HISTORY — DX: Personal history of other diseases of the digestive system: Z87.19

## 2011-12-25 HISTORY — DX: Nutritional anemia, unspecified: D53.9

## 2011-12-25 HISTORY — DX: Diverticulosis of intestine, part unspecified, without perforation or abscess without bleeding: K57.90

## 2011-12-25 HISTORY — DX: Essential (primary) hypertension: I10

## 2011-12-25 HISTORY — DX: Nocturia: R35.1

## 2011-12-25 HISTORY — PX: KNEE ARTHROSCOPY: SHX127

## 2011-12-25 HISTORY — DX: Gastro-esophageal reflux disease without esophagitis: K21.9

## 2011-12-25 HISTORY — DX: Monoclonal gammopathy: D47.2

## 2011-12-25 HISTORY — DX: Other hemorrhoids: K64.8

## 2011-12-25 HISTORY — DX: Type 2 diabetes mellitus without complications: E11.9

## 2011-12-25 HISTORY — DX: Unspecified osteoarthritis, unspecified site: M19.90

## 2011-12-25 HISTORY — DX: Unspecified tear of unspecified meniscus, current injury, unspecified knee, initial encounter: S83.209A

## 2011-12-25 HISTORY — DX: Benign prostatic hyperplasia without lower urinary tract symptoms: N40.0

## 2011-12-25 HISTORY — DX: Pain in right knee: M25.561

## 2011-12-25 LAB — POCT I-STAT, CHEM 8
BUN: 38 mg/dL — ABNORMAL HIGH (ref 6–23)
Calcium, Ion: 1.28 mmol/L (ref 1.13–1.30)
Chloride: 113 mEq/L — ABNORMAL HIGH (ref 96–112)
Creatinine, Ser: 1.7 mg/dL — ABNORMAL HIGH (ref 0.50–1.35)
Glucose, Bld: 108 mg/dL — ABNORMAL HIGH (ref 70–99)
HCT: 32 % — ABNORMAL LOW (ref 39.0–52.0)
Hemoglobin: 10.9 g/dL — ABNORMAL LOW (ref 13.0–17.0)
Potassium: 5.7 mEq/L — ABNORMAL HIGH (ref 3.5–5.1)
Sodium: 139 mEq/L (ref 135–145)
TCO2: 16 mmol/L (ref 0–100)

## 2011-12-25 SURGERY — ARTHROSCOPY, KNEE
Anesthesia: General | Site: Knee | Laterality: Right | Wound class: Clean

## 2011-12-25 MED ORDER — LIDOCAINE HCL (CARDIAC) 20 MG/ML IV SOLN
INTRAVENOUS | Status: DC | PRN
  Administered 2011-12-25: 75 mg via INTRAVENOUS

## 2011-12-25 MED ORDER — ACETAMINOPHEN 10 MG/ML IV SOLN
INTRAVENOUS | Status: DC | PRN
  Administered 2011-12-25: 1000 mg via INTRAVENOUS

## 2011-12-25 MED ORDER — OXYCODONE HCL 5 MG PO TABS
5.0000 mg | ORAL_TABLET | ORAL | Status: AC | PRN
  Administered 2011-12-25: 5 mg via ORAL

## 2011-12-25 MED ORDER — CEFAZOLIN SODIUM-DEXTROSE 2-3 GM-% IV SOLR
2.0000 g | INTRAVENOUS | Status: AC
  Administered 2011-12-25: 2 g via INTRAVENOUS

## 2011-12-25 MED ORDER — KETOROLAC TROMETHAMINE 30 MG/ML IJ SOLN: 15.0000 mg | Freq: Once | INTRAMUSCULAR | Status: DC | PRN

## 2011-12-25 MED ORDER — BACITRACIN-NEOMYCIN-POLYMYXIN 400-5-5000 EX OINT
TOPICAL_OINTMENT | CUTANEOUS | Status: DC | PRN
  Administered 2011-12-25: 1 via TOPICAL

## 2011-12-25 MED ORDER — LACTATED RINGERS IV SOLN
INTRAVENOUS | Status: DC
  Administered 2011-12-25 (×2): via INTRAVENOUS

## 2011-12-25 MED ORDER — EPHEDRINE SULFATE 50 MG/ML IJ SOLN
INTRAMUSCULAR | Status: DC | PRN
  Administered 2011-12-25: 15 mg via INTRAVENOUS

## 2011-12-25 MED ORDER — FENTANYL CITRATE 0.05 MG/ML IJ SOLN
INTRAMUSCULAR | Status: DC | PRN
  Administered 2011-12-25: 50 ug via INTRAVENOUS
  Administered 2011-12-25 (×3): 25 ug via INTRAVENOUS

## 2011-12-25 MED ORDER — PROMETHAZINE HCL 25 MG/ML IJ SOLN: 6.2500 mg | INTRAMUSCULAR | Status: DC | PRN

## 2011-12-25 MED ORDER — CHLORHEXIDINE GLUCONATE 4 % EX LIQD: 60.0000 mL | Freq: Once | CUTANEOUS | Status: DC

## 2011-12-25 MED ORDER — ONDANSETRON HCL 4 MG/2ML IJ SOLN
INTRAMUSCULAR | Status: DC | PRN
  Administered 2011-12-25: 4 mg via INTRAVENOUS

## 2011-12-25 MED ORDER — LACTATED RINGERS IV SOLN: INTRAVENOUS | Status: DC

## 2011-12-25 MED ORDER — SODIUM CHLORIDE 0.9 % IR SOLN
Status: DC | PRN
  Administered 2011-12-25: 6000 mL

## 2011-12-25 MED ORDER — BUPIVACAINE-EPINEPHRINE 0.25% -1:200000 IJ SOLN
INTRAMUSCULAR | Status: DC | PRN
  Administered 2011-12-25: 30 mL

## 2011-12-25 MED ORDER — FENTANYL CITRATE 0.05 MG/ML IJ SOLN: 25.0000 ug | INTRAMUSCULAR | Status: DC | PRN

## 2011-12-25 MED ORDER — PROPOFOL 10 MG/ML IV BOLUS
INTRAVENOUS | Status: DC | PRN
  Administered 2011-12-25: 200 mg via INTRAVENOUS

## 2011-12-25 SURGICAL SUPPLY — 34 items
BANDAGE ELASTIC 4 VELCRO ST LF (GAUZE/BANDAGES/DRESSINGS) ×2 IMPLANT
BANDAGE ELASTIC 6 VELCRO ST LF (GAUZE/BANDAGES/DRESSINGS) ×2 IMPLANT
BLADE GREAT WHITE 4.2 (BLADE) ×2 IMPLANT
BNDG COHESIVE 6X5 TAN NS LF (GAUZE/BANDAGES/DRESSINGS) ×2 IMPLANT
CANISTER SUCT LVC 12 LTR MEDI- (MISCELLANEOUS) ×2 IMPLANT
CANISTER SUCTION 2500CC (MISCELLANEOUS) ×2 IMPLANT
CLOTH BEACON ORANGE TIMEOUT ST (SAFETY) ×2 IMPLANT
DRAPE ARTHROSCOPY W/POUCH 114 (DRAPES) ×2 IMPLANT
DRAPE LG THREE QUARTER DISP (DRAPES) ×2 IMPLANT
DRSG EMULSION OIL 3X3 NADH (GAUZE/BANDAGES/DRESSINGS) ×2 IMPLANT
DRSG PAD ABDOMINAL 8X10 ST (GAUZE/BANDAGES/DRESSINGS) ×5 IMPLANT
DURAPREP 26ML APPLICATOR (WOUND CARE) ×2 IMPLANT
GAUZE SPONGE 4X4 12PLY STRL LF (GAUZE/BANDAGES/DRESSINGS) ×1 IMPLANT
GLOVE BIO SURGEON STRL SZ 6.5 (GLOVE) ×1 IMPLANT
GLOVE ECLIPSE 6.0 STRL STRAW (GLOVE) ×1 IMPLANT
GLOVE ECLIPSE 8.0 STRL XLNG CF (GLOVE) ×2 IMPLANT
GLOVE INDICATOR 6.5 STRL GRN (GLOVE) ×2 IMPLANT
GLOVE INDICATOR 8.5 STRL (GLOVE) ×4 IMPLANT
GLOVE SURG ORTHO 6.0 STRL STRW (GLOVE) ×2 IMPLANT
GOWN PREVENTION PLUS LG XLONG (DISPOSABLE) ×3 IMPLANT
GOWN STRL REIN XL XLG (GOWN DISPOSABLE) ×2 IMPLANT
IV NS IRRIG 3000ML ARTHROMATIC (IV SOLUTION) ×4 IMPLANT
KNEE WRAP E Z 3 GEL PACK (MISCELLANEOUS) ×2 IMPLANT
MINI VAC (SURGICAL WAND) ×1 IMPLANT
PACK ARTHROSCOPY DSU (CUSTOM PROCEDURE TRAY) ×2 IMPLANT
PACK BASIN DAY SURGERY FS (CUSTOM PROCEDURE TRAY) ×2 IMPLANT
PADDING CAST COTTON 6X4 STRL (CAST SUPPLIES) ×2 IMPLANT
SET ARTHROSCOPY TUBING (MISCELLANEOUS) ×2
SET ARTHROSCOPY TUBING LN (MISCELLANEOUS) ×1 IMPLANT
SPONGE GAUZE 4X4 12PLY (GAUZE/BANDAGES/DRESSINGS) ×2 IMPLANT
SUT ETHILON 3 0 PS 1 (SUTURE) ×2 IMPLANT
TOWEL OR 17X24 6PK STRL BLUE (TOWEL DISPOSABLE) ×2 IMPLANT
WAND 90 DEG TURBOVAC W/CORD (SURGICAL WAND) ×2 IMPLANT
WATER STERILE IRR 500ML POUR (IV SOLUTION) ×2 IMPLANT

## 2011-12-25 NOTE — Transfer of Care (Signed)
Immediate Anesthesia Transfer of Care Note  Patient: Adam [name]  Procedure(s) Performed: Procedure(s) (LRB): ARTHROSCOPY KNEE (Right)  Patient Location: Patient transported to PACU with oxygen via face mask at 4 Liters / Min  Anesthesia Type: General  Level of Consciousness: awake and alert   Airway & Oxygen Therapy: Patient Spontanous Breathing and Patient connected to face mask oxygen  Post-op Assessment: Report given to PACU RN and Post -op Vital signs reviewed and stable  Post vital signs: Reviewed and stable  Dentition: Teeth and oropharynx remain in pre-op condition  Complications: No apparent anesthesia complications

## 2011-12-25 NOTE — Anesthesia Preprocedure Evaluation (Addendum)
Anesthesia Evaluation  Patient identified by MRN, date of birth, ID band Patient awake  General Assessment Comment:Re check K, 6.2 on 9/20  Reviewed: Allergy & Precautions, H&P , NPO status , Patient's Chart, lab work & pertinent test results  Airway Mallampati: II TM Distance: <3 FB Neck ROM: Full    Dental No notable dental hx.    Pulmonary neg pulmonary ROS,  breath sounds clear to auscultation  Pulmonary exam normal       Cardiovascular hypertension, Pt. on medications Rhythm:Regular Rate:Normal     Neuro/Psych negative neurological ROS  negative psych ROS   GI/Hepatic Neg liver ROS, GERD-  Medicated,  Endo/Other  diabetes, Type 2  Renal/GU Renal InsufficiencyRenal disease  negative genitourinary   Musculoskeletal negative musculoskeletal ROS (+)   Abdominal   Peds negative pediatric ROS (+)  Hematology Monoclonal gammopathy   Anesthesia Other Findings   Reproductive/Obstetrics negative OB ROS                         Anesthesia Physical Anesthesia Plan  ASA: III  Anesthesia Plan: General   Post-op Pain Management:    Induction: Intravenous  Airway Management Planned: LMA  Additional Equipment:   Intra-op Plan:   Post-operative Plan:   Informed Consent: I have reviewed the patients History and Physical, chart, labs and discussed the procedure including the risks, benefits and alternatives for the proposed anesthesia with the patient or authorized representative who has indicated his/her understanding and acceptance.   Dental advisory given  Plan Discussed with: CRNA and Surgeon  Anesthesia Plan Comments:         Anesthesia Quick Evaluation

## 2011-12-25 NOTE — Interval H&P Note (Signed)
History and Physical Interval Note:  12/25/2011 12:48 PM  Adam [name]  has presented today for surgery, with the diagnosis of right medial meniscal tear  The various methods of treatment have been discussed with the patient and family. After consideration of risks, benefits and other options for treatment, the patient has consented to  Procedure(s) (LRB) with comments: ARTHROSCOPY KNEE (N/A) - right knee arthroscopy with medial menisectomy   type II diabetes   as a surgical intervention .  The patient's history has been reviewed, patient examined, no change in status, stable for surgery.  I have reviewed the patient's chart and labs.  Questions were answered to the patient's satisfaction.     [name]

## 2011-12-25 NOTE — Anesthesia Postprocedure Evaluation (Signed)
  Anesthesia Post-op Note  Patient: Adam [name]  Procedure(s) Performed: Procedure(s) (LRB): ARTHROSCOPY KNEE (Right)  Patient Location: PACU  Anesthesia Type: General  Level of Consciousness: awake and alert   Airway and Oxygen Therapy: Patient Spontanous Breathing  Post-op Pain: mild  Post-op Assessment: Post-op Vital signs reviewed, Patient's Cardiovascular Status Stable, Respiratory Function Stable, Patent Airway and No signs of Nausea or vomiting  Post-op Vital Signs: stable  Complications: No apparent anesthesia complications

## 2011-12-25 NOTE — H&P (Signed)
  Adam [name] DOB: Apr 04, 1930  Chief Complaint: right knee pain  History of Present Illness The patient is a 78 year old male who is scheduled for a right knee arthroscopy with medial menisectomy by Dr. Gladstone Lighter on Tuesday December 25, 2011. Adam [name] is having quite a bit of pain in his right knee. He had an injection by Dr. Ouida Sills that didn't really help that much.MRI revealed he has a tear of the posterior horn of the medial meniscus with a small flap tear fragment as well. He also has some obvious arthritic changes within the knee.  Allergies No Known Drug Allergies.   Past Medical History Hypercholesterolemia Osteoarthritis Hypertension Diabetes Mellitus, Type II Gout   Medication History Voltaren 1% Gel,  Mobic 15MG  Tablet Lisinopril (40MG  Tablet, Oral) Active. Labetalol HCl (100MG  Tablet, Oral) Active. Benicar (40MG  Tablet, Oral) Active. Spironolactone (25MG  Tablet, Oral) Active. Uloric (80MG  Tablet, Oral) Active. Colcrys (0.6MG  Tablet, Oral) Active. Simvastatin (40MG  Tablet, Oral) Active. NexIUM (40MG  Capsule DR, Oral) Active. Avodart (0.5MG  Capsule, Oral) Active. Oxycodone-Acetaminophen (5-325MG  Tablet, Oral) Active.  Surgical History No pertinent surgical history   Family History Cancer. sister Congestive Heart Failure. father Diabetes Mellitus. brother Heart Disease. father Heart disease in male family member before age 34 Hypertension. father   Social History Alcohol use. current drinker; drinks beer, wine and hard liquor; 5-7 per week Children. 2 Current work status. retired Exercise. Exercises daily; does running / walking Illicit drug use. no Living situation. live with spouse Marital status. married Tobacco / smoke exposure. yes outdoors only Tobacco use. former smoker; smoke(d) 1 pack(s) per day; uses less than half 1/2 can(s) smokeless per week  Review of Systems General:Not Present- Chills, Fever,  Night Sweats, Appetite Loss, Fatigue, Feeling sick, Weight Gain and Weight Loss. Skin:Not Present- Itching, Rash, Skin Color Changes, Ulcer, Psoriasis and Change in Hair or Nails. HEENT:Not Present- Sensitivity to light, Hearing problems, Nose Bleed and Ringing in the Ears. Neck:Not Present- Swollen Glands and Neck Mass. Respiratory:Not Present- Snoring, Chronic Cough, Bloody sputum and Dyspnea. Cardiovascular:Not Present- Shortness of Breath, Chest Pain, Swelling of Extremities, Leg Cramps and Palpitations. Gastrointestinal:Not Present- Bloody Stool, Heartburn, Abdominal Pain, Vomiting, Nausea and Incontinence of Stool. Male Genitourinary:Not Present- Blood in Urine, Frequency, Incontinence and Nocturia. Musculoskeletal:Present- Joint Stiffness and Joint Pain. Not Present- Muscle Weakness, Muscle Pain, Joint Swelling and Back Pain. Neurological:Not Present- Tingling, Numbness, Burning, Tremor, Headaches and Dizziness. Psychiatric:Not Present- Anxiety, Depression and Memory Loss. Endocrine:Not Present- Cold Intolerance, Heat Intolerance, Excessive hunger and Excessive Thirst. Hematology:Present- Anemia. Not Present- Abnormal Bleeding, Blood Clots and Easy Bruising.   Vitals Weight: 150 lb Height: 65 in Body Surface Area: 1.77 m Body Mass Index: 24.96 kg/m Pulse: 65 (Regular) BP: 132/61 (Sitting, Left Arm, Standard)  Physical Exam His physical exam shows his knee is still painful. His motion is intact except with acute flexion he has pain. He has a minimal effusion, no signs of any infection. Calf is soft and nontender. No phlebitis. Gait: He ambulates without support.  Lungs: Clear to auscultation Heart: Normal sinus rhythm, no murmurs. Oral Cavity: Negative. Neck: Supple, no bruit.    Assessment & Plan Right knee medial meniscus tear Osteoarthritis, right knee Right knee arthroscopy with medial menisectomy     Ardeen Jourdain, PA-C

## 2011-12-25 NOTE — Brief Op Note (Signed)
12/25/2011  1:40 PM  PATIENT:  Adam [name]  78 y.o. male  PRE-OPERATIVE DIAGNOSIS:  right medial and Lateral  meniscal tear and Osteoarthritis,right Knee.  POST-OPERATIVE DIAGNOSIS:  Same as Pre-Op  PROCEDURE:  Right medial and Lateral Meniscectomy and Synovectomy and Abraision Chondroplasty of Medial Femoral Condyle.  SURGEON:  Surgeon(s) and Role:    * Tobi Bastos, MD - Primary     ASSISTANTS OR Tech   ANESTHESIA:   general  EBL:  Total I/O In: 800 [I.V.:800] Out: -   BLOOD ADMINISTERED:none  DRAINS: none   LOCAL MEDICATIONS USED:  MARCAINE   30cc of 0.25% with Epinephrine.  SPECIMEN:  No Specimen  DISPOSITION OF SPECIMEN:  N/A  COUNTS:  YES  TOURNIQUET:  * No tourniquets in log *  DICTATION: .Other Dictation: Dictation Number [id number]  PLAN OF CARE: Discharge to home after PACU  PATIENT DISPOSITION:  PACU - hemodynamically stable.   Delay start of Pharmacological VTE agent (>24hrs) due to surgical blood loss or risk of bleeding: yes

## 2011-12-25 NOTE — H&P (View-Only) (Signed)
NPO AFTER MN. ARRIVES AT 1030. NEEDS EKG. PT TO GET LAB DONE Friday 12-21-2011 OR Monday 12-24-2011. WILL TAKE LABETOLOL AM OF SURG W/ SIP OF WATER.

## 2011-12-25 NOTE — Anesthesia Procedure Notes (Signed)
Procedure Name: LMA Insertion Date/Time: 12/25/2011 12:52 PM Performed by: Christiana Fuchs Pre-anesthesia Checklist: Patient identified, Emergency Drugs available, Suction available and Patient being monitored Patient Re-evaluated:Patient Re-evaluated prior to inductionOxygen Delivery Method: Circle System Utilized Preoxygenation: Pre-oxygenation with 100% oxygen Intubation Type: IV induction Ventilation: Mask ventilation without difficulty LMA: LMA inserted LMA Size: 4.0 Number of attempts: 1 Airway Equipment and Method: bite block Placement Confirmation: positive ETCO2 Tube secured with: Tape Dental Injury: Teeth and Oropharynx as per pre-operative assessment

## 2011-12-26 ENCOUNTER — Encounter (HOSPITAL_BASED_OUTPATIENT_CLINIC_OR_DEPARTMENT_OTHER): Payer: Self-pay | Admitting: Orthopedic Surgery

## 2011-12-26 NOTE — Op Note (Signed)
NAMELEN, PRIMAS NO.:  [id number]  MEDICAL RECORD NO.:  [id number]  LOCATION:                                 FACILITY:  PHYSICIAN:  Kipp Brood. Everett Ehrler, M.D.DATE OF BIRTH:  1930/11/20  DATE OF PROCEDURE:  12/25/2011 DATE OF DISCHARGE:                              OPERATIVE REPORT   SURGEON:  Kipp Brood. Gladstone Lighter, MD  ASSISTANT:  Nurse.  PREOPERATIVE DIAGNOSES: 1. Torn medial meniscus, right knee. 2. Torn lateral meniscus, right knee. 3. Severe chondrocalcinosis, right knee.  POSTOPERATIVE DIAGNOSES: 1. Torn medial meniscus, right knee. 2. Torn lateral meniscus, right knee. 3. Severe chondrocalcinosis, right knee.  OPERATION: 1. Diagnostic arthroscopy, right knee. 2. Medial meniscectomy, right knee. 3. Lateral meniscectomy, right knee. 4. Abrasion chondroplasty of medial femoral condyle, right knee. 5. Synovectomy, suprapatellar pouch, right knee.  PROCEDURE:  Under general anesthesia with the patient's right leg in a knee holder, appropriate time-out was carried out prior to surgery, also marked the appropriate leg in the holding area.  He was given 2 g of IV Ancef.  A small punctate incision made in suprapatellar pouch after we did the prep.  The inflow cannula was inserted.  Knee was distended with saline.  Another small punctate incision was made in the anterolateral joint.  The arthroscope was entered from lateral approach and a complete diagnostic arthroscopy was carried out.  I went up in the suprapatellar pouch.  He had severe synovitis.  I did a synovectomy utilizing the ArthroCare, went down in the lateral joint.  He had irregular tears with obvious chondrocalcinosis in the lateral joint.  He had tears of the lateral meniscus.  I introduced shaver suction device and did a partial lateral meniscectomy.  Cruciates were intact.  The medial meniscus was severely torn posteriorly with evidence of chondrocalcinosis.  Also, he had severe  arthritic changes on the distal end of the medial femoral condyle.  I did abrasion chondroplasty of the medial femoral condyle as well as a partial medial meniscectomy.  I thoroughly irrigated out the knee.  There were no other abnormalities noted.  All fluid was removed.  I closed all 3 punctate incisions with 3-0 nylon suture.  I injected 30 mL of 0.25% Marcaine with epinephrine in the joint.  Sterile Neosporin dressing was applied. He will be on aspirin postop twice a day as an anticoagulant, Percocet 10/650 for pain.  All the instructions postop were given to him.          ______________________________ Kipp Brood. Gladstone Lighter, M.D.     RAG/MEDQ  D:  12/25/2011  T:  12/26/2011  Job:  [id number]

## 2011-12-28 ENCOUNTER — Encounter: Payer: Self-pay | Admitting: Internal Medicine

## 2011-12-31 ENCOUNTER — Encounter (HOSPITAL_BASED_OUTPATIENT_CLINIC_OR_DEPARTMENT_OTHER): Payer: Self-pay

## 2012-02-01 ENCOUNTER — Telehealth: Payer: Self-pay | Admitting: Oncology

## 2012-02-01 ENCOUNTER — Ambulatory Visit (HOSPITAL_BASED_OUTPATIENT_CLINIC_OR_DEPARTMENT_OTHER): Payer: Medicare Other | Admitting: Oncology

## 2012-02-01 ENCOUNTER — Other Ambulatory Visit (HOSPITAL_BASED_OUTPATIENT_CLINIC_OR_DEPARTMENT_OTHER): Payer: Medicare Other | Admitting: Lab

## 2012-02-01 VITALS — BP 140/61 | HR 69 | Temp 98.1°F | Resp 18 | Ht 65.0 in | Wt 146.2 lb

## 2012-02-01 DIAGNOSIS — D649 Anemia, unspecified: Secondary | ICD-10-CM | POA: Diagnosis not present

## 2012-02-01 DIAGNOSIS — D729 Disorder of white blood cells, unspecified: Secondary | ICD-10-CM

## 2012-02-01 DIAGNOSIS — D7289 Other specified disorders of white blood cells: Secondary | ICD-10-CM

## 2012-02-01 LAB — COMPREHENSIVE METABOLIC PANEL (CC13)
ALT: 56 U/L — ABNORMAL HIGH (ref 0–55)
AST: 39 U/L — ABNORMAL HIGH (ref 5–34)
Albumin: 3.8 g/dL (ref 3.5–5.0)
Alkaline Phosphatase: 68 U/L (ref 40–150)
BUN: 28 mg/dL — ABNORMAL HIGH (ref 7.0–26.0)
CO2: 21 mEq/L — ABNORMAL LOW (ref 22–29)
Calcium: 9.5 mg/dL (ref 8.4–10.4)
Chloride: 113 mEq/L — ABNORMAL HIGH (ref 98–107)
Creatinine: 1.4 mg/dL — ABNORMAL HIGH (ref 0.7–1.3)
Glucose: 130 mg/dl — ABNORMAL HIGH (ref 70–99)
Potassium: 4.6 mEq/L (ref 3.5–5.1)
Sodium: 138 mEq/L (ref 136–145)
Total Bilirubin: 0.61 mg/dL (ref 0.20–1.20)
Total Protein: 6.4 g/dL (ref 6.4–8.3)

## 2012-02-01 LAB — CBC WITH DIFFERENTIAL/PLATELET
BASO%: 3.4 % — ABNORMAL HIGH (ref 0.0–2.0)
Basophils Absolute: 0.1 10*3/uL (ref 0.0–0.1)
EOS%: 5.5 % (ref 0.0–7.0)
Eosinophils Absolute: 0.2 10*3/uL (ref 0.0–0.5)
HCT: 28 % — ABNORMAL LOW (ref 38.4–49.9)
HGB: 9.8 g/dL — ABNORMAL LOW (ref 13.0–17.1)
LYMPH%: 25.2 % (ref 14.0–49.0)
MCH: 35.8 pg — ABNORMAL HIGH (ref 27.2–33.4)
MCHC: 35.1 g/dL (ref 32.0–36.0)
MCV: 102 fL — ABNORMAL HIGH (ref 79.3–98.0)
MONO#: 0.4 10*3/uL (ref 0.1–0.9)
MONO%: 11.6 % (ref 0.0–14.0)
NEUT#: 1.9 10*3/uL (ref 1.5–6.5)
NEUT%: 54.3 % (ref 39.0–75.0)
Platelets: 160 10*3/uL (ref 140–400)
RBC: 2.74 10*6/uL — ABNORMAL LOW (ref 4.20–5.82)
RDW: 14.4 % (ref 11.0–14.6)
WBC: 3.5 10*3/uL — ABNORMAL LOW (ref 4.0–10.3)
lymph#: 0.9 10*3/uL (ref 0.9–3.3)

## 2012-02-01 NOTE — Telephone Encounter (Signed)
gv and printed pt appt for May 2014

## 2012-02-01 NOTE — Progress Notes (Signed)
Hematology and Oncology Follow Up Visit  Adam [name]:[id number] Mar 01, 1931 78 y.o. 02/01/2012 10:08 AM    Principle Diagnosis: 78 year old with anemia and IgM monoclonal gammopathy. Likely due to MDS vs possible lymphoproliferative disorder.   Current therapy: observation and surveillance.   Interim History:  [name] presents today for a follow up visit. His a pleasant man I saw on 7/16 for anemia and IgM M spike detected only by immune fixation. He is asymptomatic at this point. He does not report any chest pain. He does not report any difficulty breathing. He had reported some fatigue at times. He had not reported any back pain. Did not report any shoulder pain. He had not had any thrombotic events. He had not had any major changes in his performance status or activity level. He is still quite active, performing again most activities of daily living without any hindrance or decline.   Medications: I have reviewed the patient's current medications. Current outpatient prescriptions:Ascorbic Acid (VITAMIN C) 1000 MG tablet, Take 1,000 mg by mouth daily., Disp: , Rfl: ;  beta carotene w/minerals (OCUVITE) tablet, Take 1 tablet by mouth daily., Disp: , Rfl: ;  colchicine (COLCRYS) 0.6 MG tablet, Take 0.6 mg by mouth daily., Disp: , Rfl: ;  dutasteride (AVODART) 0.5 MG capsule, Take 0.5 mg by mouth daily., Disp: , Rfl:  esomeprazole (NEXIUM) 40 MG capsule, Take 40 mg by mouth every evening. , Disp: , Rfl: ;  Febuxostat (ULORIC) 80 MG TABS, Take 80 mg by mouth daily., Disp: , Rfl: ;  Flaxseed, Linseed, (FLAXSEED OIL) 1000 MG CAPS, Take 2,000 mg by mouth daily., Disp: , Rfl: ;  glucosamine-chondroitin 500-400 MG tablet, Take 1 tablet by mouth daily., Disp: , Rfl: ;  labetalol (NORMODYNE) 100 MG tablet, Take 100 mg by mouth 2 (two) times daily., Disp: , Rfl:  lisinopril (PRINIVIL,ZESTRIL) 20 MG tablet, Take 20 mg by mouth daily., Disp: , Rfl: ;  olmesartan (BENICAR) 40 MG tablet, Take 40 mg by mouth  daily., Disp: , Rfl: ;  Omega-3 Fatty Acids (FISH OIL) 1000 MG CAPS, Take 1,000 mg by mouth 3 (three) times daily., Disp: , Rfl: ;  simvastatin (ZOCOR) 40 MG tablet, Take 40 mg by mouth every evening., Disp: , Rfl: ;  spironolactone (ALDACTONE) 25 MG tablet, Take 25 mg by mouth daily., Disp: , Rfl:  traMADol (ULTRAM) 50 MG tablet, Take 50 mg by mouth every 6 (six) hours as needed., Disp: , Rfl:   Allergies: No Known Allergies  Past Medical History, Surgical history, Social history, and Family History were reviewed and updated.  Review of Systems: Constitutional:  Negative for fever, chills, night sweats, anorexia, weight loss, pain. Cardiovascular: no chest pain or dyspnea on exertion Respiratory: negative Neurological: negative Dermatological: negative ENT: negative Skin: Negative. Gastrointestinal: negative Genito-Urinary: negative Hematological and Lymphatic: negative Breast: negative Musculoskeletal: negative Remaining ROS negative. Physical Exam: Blood pressure 140/61, pulse 69, temperature 98.1 F (36.7 C), temperature source Oral, resp. rate 18, height 5\' 5"  (1.651 m), weight 146 lb 3.2 oz (66.316 kg). ECOG: 1 General appearance: alert Head: Normocephalic, without obvious abnormality, atraumatic Neck: no adenopathy, no carotid bruit, no JVD, supple, symmetrical, trachea midline and thyroid not enlarged, symmetric, no tenderness/mass/nodules Lymph nodes: Cervical, supraclavicular, and axillary nodes normal. Heart:regular rate and rhythm, S1, S2 normal, no murmur, click, rub or gallop Lung:chest clear, no wheezing, rales, normal symmetric air entry Abdomin: soft, non-tender, without masses or organomegaly EXT:no erythema, induration, or nodules   Lab  Results: Lab Results  Component Value Date   WBC 3.5* 02/01/2012   HGB 9.8* 02/01/2012   HCT 28.0* 02/01/2012   MCV 102.0* 02/01/2012   PLT 160 02/01/2012     Chemistry      Component Value Date/Time   NA 139 12/25/2011 1103    K 5.7* 12/25/2011 1103   CL 113* 12/25/2011 1103   CO2 20 12/21/2011 1011   BUN 38* 12/25/2011 1103   CREATININE 1.70* 12/25/2011 1103      Component Value Date/Time   CALCIUM 9.3 12/21/2011 1011   ALKPHOS 63 12/21/2011 1011   AST 40* 12/21/2011 1011   ALT 56* 12/21/2011 1011   BILITOT 0.3 12/21/2011 1011       Impression and Plan: 78 year old gentleman with the following issues:  1. Possible Plasma cell disorder. He does have an element of a monoclonal protein, although his M spike is really very small until only detected by immunofixation. His quantitative immunoglobulins are normal.  Differential diagnosis  Includes monoclonal gammopathy of undetermined significance, IgM multiple myeloma is extremely rare with a negative skeletal survey and lymphoproliferative like  Waldenstrom macroglobulinemia. For the time being, given his lack of symptoms, I will continue to observe.   2. Anemia. Differential diagnosis anemia of renal disease due to his chronic renal insufficiency. Could also have an element of myelodysplastic syndrome. Also this could be a function of a lymphoproliferative disorder that is causing his IgM monoclonal protein.  I have discussed with him the need for a bone marrow to determine exactly why he has macrocytic anemia. The odds are he could have an element of myelodysplastic syndrome that might require growth factor support in the near  future.  Adam [name] elected to defer that to a future date. He is agreeable to proceed if his counts drop further or he becomes symptomatic. I will repeat his counts in 6 months.  I encouraged him to contact me sooner if he develops any symptoms.      [id number], MD 11/1/201310:08 AM

## 2012-03-24 DIAGNOSIS — L600 Ingrowing nail: Secondary | ICD-10-CM | POA: Diagnosis not present

## 2012-03-24 DIAGNOSIS — L03039 Cellulitis of unspecified toe: Secondary | ICD-10-CM | POA: Diagnosis not present

## 2012-05-06 ENCOUNTER — Telehealth: Payer: Self-pay | Admitting: Oncology

## 2012-05-06 NOTE — Telephone Encounter (Signed)
pt does not want to r/s is not happy about the possible biopsy wants to wait

## 2012-08-20 ENCOUNTER — Ambulatory Visit: Payer: Medicare Other | Admitting: Oncology

## 2012-08-20 ENCOUNTER — Other Ambulatory Visit: Payer: Medicare Other | Admitting: Lab

## 2012-09-22 DIAGNOSIS — E1129 Type 2 diabetes mellitus with other diabetic kidney complication: Secondary | ICD-10-CM | POA: Diagnosis not present

## 2012-09-22 DIAGNOSIS — Z125 Encounter for screening for malignant neoplasm of prostate: Secondary | ICD-10-CM | POA: Diagnosis not present

## 2012-09-22 DIAGNOSIS — M109 Gout, unspecified: Secondary | ICD-10-CM | POA: Diagnosis not present

## 2012-09-22 DIAGNOSIS — E785 Hyperlipidemia, unspecified: Secondary | ICD-10-CM | POA: Diagnosis not present

## 2012-09-23 DIAGNOSIS — E875 Hyperkalemia: Secondary | ICD-10-CM | POA: Diagnosis not present

## 2012-09-25 DIAGNOSIS — E1129 Type 2 diabetes mellitus with other diabetic kidney complication: Secondary | ICD-10-CM | POA: Diagnosis not present

## 2012-09-29 DIAGNOSIS — Z0000 Encounter for general adult medical examination without abnormal findings: Secondary | ICD-10-CM | POA: Diagnosis not present

## 2012-09-29 DIAGNOSIS — N183 Chronic kidney disease, stage 3 unspecified: Secondary | ICD-10-CM | POA: Diagnosis not present

## 2012-09-29 DIAGNOSIS — N401 Enlarged prostate with lower urinary tract symptoms: Secondary | ICD-10-CM | POA: Diagnosis not present

## 2012-09-29 DIAGNOSIS — E875 Hyperkalemia: Secondary | ICD-10-CM | POA: Diagnosis not present

## 2012-09-29 DIAGNOSIS — Z125 Encounter for screening for malignant neoplasm of prostate: Secondary | ICD-10-CM | POA: Diagnosis not present

## 2012-09-29 DIAGNOSIS — D472 Monoclonal gammopathy: Secondary | ICD-10-CM | POA: Diagnosis not present

## 2012-09-29 DIAGNOSIS — M549 Dorsalgia, unspecified: Secondary | ICD-10-CM | POA: Diagnosis not present

## 2012-09-29 DIAGNOSIS — D649 Anemia, unspecified: Secondary | ICD-10-CM | POA: Diagnosis not present

## 2012-09-29 DIAGNOSIS — E1129 Type 2 diabetes mellitus with other diabetic kidney complication: Secondary | ICD-10-CM | POA: Diagnosis not present

## 2012-09-29 DIAGNOSIS — I10 Essential (primary) hypertension: Secondary | ICD-10-CM | POA: Diagnosis not present

## 2012-10-02 DIAGNOSIS — H43819 Vitreous degeneration, unspecified eye: Secondary | ICD-10-CM | POA: Diagnosis not present

## 2012-10-02 DIAGNOSIS — Z961 Presence of intraocular lens: Secondary | ICD-10-CM | POA: Diagnosis not present

## 2012-10-02 DIAGNOSIS — H3530 Unspecified macular degeneration: Secondary | ICD-10-CM | POA: Diagnosis not present

## 2012-10-02 DIAGNOSIS — E119 Type 2 diabetes mellitus without complications: Secondary | ICD-10-CM | POA: Diagnosis not present

## 2012-10-30 ENCOUNTER — Inpatient Hospital Stay (HOSPITAL_COMMUNITY): Admission: AD | Admit: 2012-10-30 | Payer: Medicare Other | Source: Ambulatory Visit | Admitting: Internal Medicine

## 2012-10-30 DIAGNOSIS — I10 Essential (primary) hypertension: Secondary | ICD-10-CM | POA: Diagnosis not present

## 2012-10-30 DIAGNOSIS — N183 Chronic kidney disease, stage 3 unspecified: Secondary | ICD-10-CM | POA: Diagnosis not present

## 2012-10-30 DIAGNOSIS — E1129 Type 2 diabetes mellitus with other diabetic kidney complication: Secondary | ICD-10-CM | POA: Diagnosis not present

## 2012-10-30 DIAGNOSIS — E875 Hyperkalemia: Secondary | ICD-10-CM | POA: Diagnosis not present

## 2012-11-03 DIAGNOSIS — E875 Hyperkalemia: Secondary | ICD-10-CM | POA: Diagnosis not present

## 2012-11-03 DIAGNOSIS — N183 Chronic kidney disease, stage 3 unspecified: Secondary | ICD-10-CM | POA: Diagnosis not present

## 2012-12-02 DIAGNOSIS — E1129 Type 2 diabetes mellitus with other diabetic kidney complication: Secondary | ICD-10-CM | POA: Diagnosis not present

## 2012-12-02 DIAGNOSIS — N183 Chronic kidney disease, stage 3 unspecified: Secondary | ICD-10-CM | POA: Diagnosis not present

## 2012-12-02 DIAGNOSIS — Z6827 Body mass index (BMI) 27.0-27.9, adult: Secondary | ICD-10-CM | POA: Diagnosis not present

## 2012-12-02 DIAGNOSIS — I10 Essential (primary) hypertension: Secondary | ICD-10-CM | POA: Diagnosis not present

## 2012-12-02 DIAGNOSIS — E875 Hyperkalemia: Secondary | ICD-10-CM | POA: Diagnosis not present

## 2012-12-23 DIAGNOSIS — N183 Chronic kidney disease, stage 3 unspecified: Secondary | ICD-10-CM | POA: Diagnosis not present

## 2012-12-23 DIAGNOSIS — Z6827 Body mass index (BMI) 27.0-27.9, adult: Secondary | ICD-10-CM | POA: Diagnosis not present

## 2012-12-23 DIAGNOSIS — I10 Essential (primary) hypertension: Secondary | ICD-10-CM | POA: Diagnosis not present

## 2012-12-23 DIAGNOSIS — E1129 Type 2 diabetes mellitus with other diabetic kidney complication: Secondary | ICD-10-CM | POA: Diagnosis not present

## 2012-12-23 DIAGNOSIS — Z23 Encounter for immunization: Secondary | ICD-10-CM | POA: Diagnosis not present

## 2013-02-03 DIAGNOSIS — E1129 Type 2 diabetes mellitus with other diabetic kidney complication: Secondary | ICD-10-CM | POA: Diagnosis not present

## 2013-02-03 DIAGNOSIS — N183 Chronic kidney disease, stage 3 unspecified: Secondary | ICD-10-CM | POA: Diagnosis not present

## 2013-02-03 DIAGNOSIS — L299 Pruritus, unspecified: Secondary | ICD-10-CM | POA: Diagnosis not present

## 2013-02-03 DIAGNOSIS — I10 Essential (primary) hypertension: Secondary | ICD-10-CM | POA: Diagnosis not present

## 2013-02-03 DIAGNOSIS — R51 Headache: Secondary | ICD-10-CM | POA: Diagnosis not present

## 2013-02-12 DIAGNOSIS — L723 Sebaceous cyst: Secondary | ICD-10-CM | POA: Diagnosis not present

## 2013-02-12 DIAGNOSIS — D485 Neoplasm of uncertain behavior of skin: Secondary | ICD-10-CM | POA: Diagnosis not present

## 2013-02-12 DIAGNOSIS — C4441 Basal cell carcinoma of skin of scalp and neck: Secondary | ICD-10-CM | POA: Diagnosis not present

## 2013-02-12 DIAGNOSIS — L570 Actinic keratosis: Secondary | ICD-10-CM | POA: Diagnosis not present

## 2013-02-12 DIAGNOSIS — C44319 Basal cell carcinoma of skin of other parts of face: Secondary | ICD-10-CM | POA: Diagnosis not present

## 2013-02-12 DIAGNOSIS — D044 Carcinoma in situ of skin of scalp and neck: Secondary | ICD-10-CM | POA: Diagnosis not present

## 2013-02-24 DIAGNOSIS — E1129 Type 2 diabetes mellitus with other diabetic kidney complication: Secondary | ICD-10-CM | POA: Diagnosis not present

## 2013-02-24 DIAGNOSIS — Z6826 Body mass index (BMI) 26.0-26.9, adult: Secondary | ICD-10-CM | POA: Diagnosis not present

## 2013-02-24 DIAGNOSIS — I10 Essential (primary) hypertension: Secondary | ICD-10-CM | POA: Diagnosis not present

## 2013-02-24 DIAGNOSIS — N183 Chronic kidney disease, stage 3 unspecified: Secondary | ICD-10-CM | POA: Diagnosis not present

## 2013-02-24 DIAGNOSIS — M1990 Unspecified osteoarthritis, unspecified site: Secondary | ICD-10-CM | POA: Diagnosis not present

## 2013-02-24 DIAGNOSIS — R51 Headache: Secondary | ICD-10-CM | POA: Diagnosis not present

## 2013-03-03 DIAGNOSIS — I10 Essential (primary) hypertension: Secondary | ICD-10-CM | POA: Diagnosis not present

## 2013-03-03 DIAGNOSIS — E1129 Type 2 diabetes mellitus with other diabetic kidney complication: Secondary | ICD-10-CM | POA: Diagnosis not present

## 2013-03-03 DIAGNOSIS — Z6826 Body mass index (BMI) 26.0-26.9, adult: Secondary | ICD-10-CM | POA: Diagnosis not present

## 2013-03-03 DIAGNOSIS — N183 Chronic kidney disease, stage 3 unspecified: Secondary | ICD-10-CM | POA: Diagnosis not present

## 2013-03-03 DIAGNOSIS — R51 Headache: Secondary | ICD-10-CM | POA: Diagnosis not present

## 2013-03-30 DIAGNOSIS — C4492 Squamous cell carcinoma of skin, unspecified: Secondary | ICD-10-CM | POA: Diagnosis not present

## 2013-03-30 DIAGNOSIS — Z85828 Personal history of other malignant neoplasm of skin: Secondary | ICD-10-CM | POA: Diagnosis not present

## 2013-04-06 ENCOUNTER — Other Ambulatory Visit: Payer: Self-pay | Admitting: Internal Medicine

## 2013-04-06 ENCOUNTER — Ambulatory Visit
Admission: RE | Admit: 2013-04-06 | Discharge: 2013-04-06 | Disposition: A | Discharge status: Home / Self Care | Payer: Medicare Other | Source: Ambulatory Visit | Attending: Internal Medicine | Admitting: Internal Medicine

## 2013-04-06 DIAGNOSIS — R519 Headache, unspecified: Secondary | ICD-10-CM

## 2013-04-06 DIAGNOSIS — E875 Hyperkalemia: Secondary | ICD-10-CM | POA: Diagnosis not present

## 2013-04-06 DIAGNOSIS — D472 Monoclonal gammopathy: Secondary | ICD-10-CM | POA: Diagnosis not present

## 2013-04-06 DIAGNOSIS — E1129 Type 2 diabetes mellitus with other diabetic kidney complication: Secondary | ICD-10-CM | POA: Diagnosis not present

## 2013-04-06 DIAGNOSIS — E785 Hyperlipidemia, unspecified: Secondary | ICD-10-CM | POA: Diagnosis not present

## 2013-04-06 DIAGNOSIS — I10 Essential (primary) hypertension: Secondary | ICD-10-CM | POA: Diagnosis not present

## 2013-04-06 DIAGNOSIS — N183 Chronic kidney disease, stage 3 unspecified: Secondary | ICD-10-CM | POA: Diagnosis not present

## 2013-04-06 DIAGNOSIS — R51 Headache: Principal | ICD-10-CM

## 2013-04-06 DIAGNOSIS — C439 Malignant melanoma of skin, unspecified: Secondary | ICD-10-CM | POA: Diagnosis not present

## 2013-04-07 ENCOUNTER — Other Ambulatory Visit (HOSPITAL_COMMUNITY): Payer: Self-pay | Admitting: Internal Medicine

## 2013-04-07 DIAGNOSIS — N183 Chronic kidney disease, stage 3 unspecified: Secondary | ICD-10-CM | POA: Diagnosis not present

## 2013-04-07 DIAGNOSIS — G939 Disorder of brain, unspecified: Secondary | ICD-10-CM

## 2013-04-15 ENCOUNTER — Encounter (HOSPITAL_COMMUNITY): Payer: Medicare Other

## 2013-04-24 ENCOUNTER — Ambulatory Visit (HOSPITAL_COMMUNITY)
Admission: RE | Admit: 2013-04-24 | Discharge: 2013-04-24 | Disposition: A | Discharge status: Home / Self Care | Payer: Medicare Other | Source: Ambulatory Visit | Attending: Internal Medicine | Admitting: Internal Medicine

## 2013-04-24 ENCOUNTER — Encounter (HOSPITAL_COMMUNITY)
Admission: RE | Admit: 2013-04-24 | Discharge: 2013-04-24 | Disposition: A | Discharge status: Home / Self Care | Payer: Medicare Other | Source: Ambulatory Visit | Attending: Internal Medicine | Admitting: Internal Medicine

## 2013-04-24 DIAGNOSIS — R948 Abnormal results of function studies of other organs and systems: Secondary | ICD-10-CM | POA: Diagnosis not present

## 2013-04-24 DIAGNOSIS — M259 Joint disorder, unspecified: Secondary | ICD-10-CM | POA: Insufficient documentation

## 2013-04-24 DIAGNOSIS — M76899 Other specified enthesopathies of unspecified lower limb, excluding foot: Secondary | ICD-10-CM | POA: Diagnosis not present

## 2013-04-24 DIAGNOSIS — G939 Disorder of brain, unspecified: Secondary | ICD-10-CM

## 2013-04-24 DIAGNOSIS — M949 Disorder of cartilage, unspecified: Secondary | ICD-10-CM | POA: Diagnosis not present

## 2013-04-24 DIAGNOSIS — M899 Disorder of bone, unspecified: Secondary | ICD-10-CM | POA: Diagnosis not present

## 2013-04-24 MED ORDER — TECHNETIUM TC 99M MEDRONATE IV KIT
26.9000 | PACK | Freq: Once | INTRAVENOUS | Status: AC | PRN
  Administered 2013-04-24: 26.9 via INTRAVENOUS

## 2013-05-14 ENCOUNTER — Ambulatory Visit
Admission: RE | Admit: 2013-05-14 | Discharge: 2013-05-14 | Disposition: A | Discharge status: Home / Self Care | Payer: Medicare Other | Source: Ambulatory Visit | Attending: Internal Medicine | Admitting: Internal Medicine

## 2013-05-14 ENCOUNTER — Other Ambulatory Visit: Payer: Self-pay | Admitting: Internal Medicine

## 2013-05-14 DIAGNOSIS — M1710 Unilateral primary osteoarthritis, unspecified knee: Secondary | ICD-10-CM | POA: Diagnosis not present

## 2013-05-14 DIAGNOSIS — M169 Osteoarthritis of hip, unspecified: Secondary | ICD-10-CM | POA: Diagnosis not present

## 2013-05-14 DIAGNOSIS — M1610 Unilateral primary osteoarthritis, unspecified hip: Secondary | ICD-10-CM | POA: Diagnosis not present

## 2013-05-14 DIAGNOSIS — IMO0002 Reserved for concepts with insufficient information to code with codable children: Secondary | ICD-10-CM | POA: Diagnosis not present

## 2013-05-14 DIAGNOSIS — D472 Monoclonal gammopathy: Secondary | ICD-10-CM

## 2013-09-24 DIAGNOSIS — Z125 Encounter for screening for malignant neoplasm of prostate: Secondary | ICD-10-CM | POA: Diagnosis not present

## 2013-09-24 DIAGNOSIS — E1129 Type 2 diabetes mellitus with other diabetic kidney complication: Secondary | ICD-10-CM | POA: Diagnosis not present

## 2013-09-24 DIAGNOSIS — M109 Gout, unspecified: Secondary | ICD-10-CM | POA: Diagnosis not present

## 2013-10-01 DIAGNOSIS — Z125 Encounter for screening for malignant neoplasm of prostate: Secondary | ICD-10-CM | POA: Diagnosis not present

## 2013-10-01 DIAGNOSIS — Z0000 Encounter for general adult medical examination without abnormal findings: Secondary | ICD-10-CM | POA: Diagnosis not present

## 2013-10-01 DIAGNOSIS — N183 Chronic kidney disease, stage 3 unspecified: Secondary | ICD-10-CM | POA: Diagnosis not present

## 2013-10-01 DIAGNOSIS — E1129 Type 2 diabetes mellitus with other diabetic kidney complication: Secondary | ICD-10-CM | POA: Diagnosis not present

## 2013-10-01 DIAGNOSIS — Z1331 Encounter for screening for depression: Secondary | ICD-10-CM | POA: Diagnosis not present

## 2013-10-01 DIAGNOSIS — R0609 Other forms of dyspnea: Secondary | ICD-10-CM | POA: Diagnosis not present

## 2013-10-01 DIAGNOSIS — N401 Enlarged prostate with lower urinary tract symptoms: Secondary | ICD-10-CM | POA: Diagnosis not present

## 2013-10-01 DIAGNOSIS — D649 Anemia, unspecified: Secondary | ICD-10-CM | POA: Diagnosis not present

## 2013-10-01 DIAGNOSIS — R0989 Other specified symptoms and signs involving the circulatory and respiratory systems: Secondary | ICD-10-CM | POA: Diagnosis not present

## 2013-10-01 DIAGNOSIS — R5381 Other malaise: Secondary | ICD-10-CM | POA: Diagnosis not present

## 2013-10-01 DIAGNOSIS — M109 Gout, unspecified: Secondary | ICD-10-CM | POA: Diagnosis not present

## 2013-10-01 DIAGNOSIS — R5383 Other fatigue: Secondary | ICD-10-CM | POA: Diagnosis not present

## 2013-10-27 DIAGNOSIS — E119 Type 2 diabetes mellitus without complications: Secondary | ICD-10-CM | POA: Diagnosis not present

## 2013-10-27 DIAGNOSIS — H43819 Vitreous degeneration, unspecified eye: Secondary | ICD-10-CM | POA: Diagnosis not present

## 2013-10-27 DIAGNOSIS — H353190 Nonexudative age-related macular degeneration, unspecified eye, stage unspecified: Secondary | ICD-10-CM | POA: Diagnosis not present

## 2013-10-27 DIAGNOSIS — Z961 Presence of intraocular lens: Secondary | ICD-10-CM | POA: Diagnosis not present

## 2013-10-27 DIAGNOSIS — H02839 Dermatochalasis of unspecified eye, unspecified eyelid: Secondary | ICD-10-CM | POA: Diagnosis not present

## 2013-10-27 DIAGNOSIS — H35379 Puckering of macula, unspecified eye: Secondary | ICD-10-CM | POA: Diagnosis not present

## 2013-11-27 DIAGNOSIS — I10 Essential (primary) hypertension: Secondary | ICD-10-CM | POA: Diagnosis not present

## 2013-11-27 DIAGNOSIS — Z6826 Body mass index (BMI) 26.0-26.9, adult: Secondary | ICD-10-CM | POA: Diagnosis not present

## 2013-11-27 DIAGNOSIS — L299 Pruritus, unspecified: Secondary | ICD-10-CM | POA: Diagnosis not present

## 2014-02-16 ENCOUNTER — Other Ambulatory Visit: Payer: Self-pay | Admitting: Dermatology

## 2014-02-16 DIAGNOSIS — Z85828 Personal history of other malignant neoplasm of skin: Secondary | ICD-10-CM | POA: Diagnosis not present

## 2014-02-16 DIAGNOSIS — L570 Actinic keratosis: Secondary | ICD-10-CM | POA: Diagnosis not present

## 2014-02-16 DIAGNOSIS — C4442 Squamous cell carcinoma of skin of scalp and neck: Secondary | ICD-10-CM | POA: Diagnosis not present

## 2014-02-16 DIAGNOSIS — L308 Other specified dermatitis: Secondary | ICD-10-CM | POA: Diagnosis not present

## 2014-02-16 DIAGNOSIS — D485 Neoplasm of uncertain behavior of skin: Secondary | ICD-10-CM | POA: Diagnosis not present

## 2014-04-06 DIAGNOSIS — C4442 Squamous cell carcinoma of skin of scalp and neck: Secondary | ICD-10-CM | POA: Diagnosis not present

## 2014-04-06 DIAGNOSIS — Z85828 Personal history of other malignant neoplasm of skin: Secondary | ICD-10-CM | POA: Diagnosis not present

## 2014-04-19 DIAGNOSIS — R809 Proteinuria, unspecified: Secondary | ICD-10-CM | POA: Diagnosis not present

## 2014-04-19 DIAGNOSIS — D649 Anemia, unspecified: Secondary | ICD-10-CM | POA: Diagnosis not present

## 2014-04-19 DIAGNOSIS — M109 Gout, unspecified: Secondary | ICD-10-CM | POA: Diagnosis not present

## 2014-04-19 DIAGNOSIS — N401 Enlarged prostate with lower urinary tract symptoms: Secondary | ICD-10-CM | POA: Diagnosis not present

## 2014-04-19 DIAGNOSIS — N183 Chronic kidney disease, stage 3 (moderate): Secondary | ICD-10-CM | POA: Diagnosis not present

## 2014-04-19 DIAGNOSIS — E1129 Type 2 diabetes mellitus with other diabetic kidney complication: Secondary | ICD-10-CM | POA: Diagnosis not present

## 2014-04-19 DIAGNOSIS — I10 Essential (primary) hypertension: Secondary | ICD-10-CM | POA: Diagnosis not present

## 2014-04-19 DIAGNOSIS — M1990 Unspecified osteoarthritis, unspecified site: Secondary | ICD-10-CM | POA: Diagnosis not present

## 2014-04-19 DIAGNOSIS — K219 Gastro-esophageal reflux disease without esophagitis: Secondary | ICD-10-CM | POA: Diagnosis not present

## 2014-04-19 DIAGNOSIS — E785 Hyperlipidemia, unspecified: Secondary | ICD-10-CM | POA: Diagnosis not present

## 2014-04-19 DIAGNOSIS — Z6825 Body mass index (BMI) 25.0-25.9, adult: Secondary | ICD-10-CM | POA: Diagnosis not present

## 2014-04-20 ENCOUNTER — Other Ambulatory Visit: Payer: Self-pay | Admitting: Dermatology

## 2014-04-20 DIAGNOSIS — C4442 Squamous cell carcinoma of skin of scalp and neck: Secondary | ICD-10-CM | POA: Diagnosis not present

## 2014-10-05 DIAGNOSIS — Z125 Encounter for screening for malignant neoplasm of prostate: Secondary | ICD-10-CM | POA: Diagnosis not present

## 2014-10-05 DIAGNOSIS — M109 Gout, unspecified: Secondary | ICD-10-CM | POA: Diagnosis not present

## 2014-10-05 DIAGNOSIS — E785 Hyperlipidemia, unspecified: Secondary | ICD-10-CM | POA: Diagnosis not present

## 2014-10-05 DIAGNOSIS — I10 Essential (primary) hypertension: Secondary | ICD-10-CM | POA: Diagnosis not present

## 2014-10-05 DIAGNOSIS — E1129 Type 2 diabetes mellitus with other diabetic kidney complication: Secondary | ICD-10-CM | POA: Diagnosis not present

## 2014-10-07 ENCOUNTER — Encounter: Payer: Self-pay | Admitting: Internal Medicine

## 2014-10-11 DIAGNOSIS — Z6825 Body mass index (BMI) 25.0-25.9, adult: Secondary | ICD-10-CM | POA: Diagnosis not present

## 2014-10-11 DIAGNOSIS — Z1389 Encounter for screening for other disorder: Secondary | ICD-10-CM | POA: Diagnosis not present

## 2014-10-11 DIAGNOSIS — R0609 Other forms of dyspnea: Secondary | ICD-10-CM | POA: Diagnosis not present

## 2014-10-11 DIAGNOSIS — I129 Hypertensive chronic kidney disease with stage 1 through stage 4 chronic kidney disease, or unspecified chronic kidney disease: Secondary | ICD-10-CM | POA: Diagnosis not present

## 2014-10-11 DIAGNOSIS — Z0000 Encounter for general adult medical examination without abnormal findings: Secondary | ICD-10-CM | POA: Diagnosis not present

## 2014-10-11 DIAGNOSIS — N401 Enlarged prostate with lower urinary tract symptoms: Secondary | ICD-10-CM | POA: Diagnosis not present

## 2014-10-11 DIAGNOSIS — E1129 Type 2 diabetes mellitus with other diabetic kidney complication: Secondary | ICD-10-CM | POA: Diagnosis not present

## 2014-10-11 DIAGNOSIS — R351 Nocturia: Secondary | ICD-10-CM | POA: Diagnosis not present

## 2014-10-11 DIAGNOSIS — C439 Malignant melanoma of skin, unspecified: Secondary | ICD-10-CM | POA: Diagnosis not present

## 2014-10-11 DIAGNOSIS — R809 Proteinuria, unspecified: Secondary | ICD-10-CM | POA: Diagnosis not present

## 2014-10-11 DIAGNOSIS — N183 Chronic kidney disease, stage 3 (moderate): Secondary | ICD-10-CM | POA: Diagnosis not present

## 2014-10-11 DIAGNOSIS — Z23 Encounter for immunization: Secondary | ICD-10-CM | POA: Diagnosis not present

## 2014-11-02 DIAGNOSIS — E119 Type 2 diabetes mellitus without complications: Secondary | ICD-10-CM | POA: Diagnosis not present

## 2014-11-02 DIAGNOSIS — H02834 Dermatochalasis of left upper eyelid: Secondary | ICD-10-CM | POA: Diagnosis not present

## 2014-11-02 DIAGNOSIS — Z961 Presence of intraocular lens: Secondary | ICD-10-CM | POA: Diagnosis not present

## 2014-11-02 DIAGNOSIS — H02831 Dermatochalasis of right upper eyelid: Secondary | ICD-10-CM | POA: Diagnosis not present

## 2014-11-02 DIAGNOSIS — H3532 Exudative age-related macular degeneration: Secondary | ICD-10-CM | POA: Diagnosis not present

## 2014-11-02 DIAGNOSIS — H35373 Puckering of macula, bilateral: Secondary | ICD-10-CM | POA: Diagnosis not present

## 2014-11-16 DIAGNOSIS — H3531 Nonexudative age-related macular degeneration: Secondary | ICD-10-CM | POA: Diagnosis not present

## 2014-11-16 DIAGNOSIS — H3532 Exudative age-related macular degeneration: Secondary | ICD-10-CM | POA: Diagnosis not present

## 2014-11-16 DIAGNOSIS — H35371 Puckering of macula, right eye: Secondary | ICD-10-CM | POA: Diagnosis not present

## 2014-11-19 DIAGNOSIS — H3532 Exudative age-related macular degeneration: Secondary | ICD-10-CM | POA: Diagnosis not present

## 2014-12-07 DIAGNOSIS — M9901 Segmental and somatic dysfunction of cervical region: Secondary | ICD-10-CM | POA: Diagnosis not present

## 2014-12-07 DIAGNOSIS — M9903 Segmental and somatic dysfunction of lumbar region: Secondary | ICD-10-CM | POA: Diagnosis not present

## 2014-12-07 DIAGNOSIS — M545 Low back pain: Secondary | ICD-10-CM | POA: Diagnosis not present

## 2014-12-07 DIAGNOSIS — M9904 Segmental and somatic dysfunction of sacral region: Secondary | ICD-10-CM | POA: Diagnosis not present

## 2014-12-10 DIAGNOSIS — M545 Low back pain: Secondary | ICD-10-CM | POA: Diagnosis not present

## 2014-12-10 DIAGNOSIS — M9904 Segmental and somatic dysfunction of sacral region: Secondary | ICD-10-CM | POA: Diagnosis not present

## 2014-12-10 DIAGNOSIS — M9901 Segmental and somatic dysfunction of cervical region: Secondary | ICD-10-CM | POA: Diagnosis not present

## 2014-12-10 DIAGNOSIS — M9903 Segmental and somatic dysfunction of lumbar region: Secondary | ICD-10-CM | POA: Diagnosis not present

## 2014-12-15 DIAGNOSIS — M545 Low back pain: Secondary | ICD-10-CM | POA: Diagnosis not present

## 2014-12-15 DIAGNOSIS — M9904 Segmental and somatic dysfunction of sacral region: Secondary | ICD-10-CM | POA: Diagnosis not present

## 2014-12-15 DIAGNOSIS — M9903 Segmental and somatic dysfunction of lumbar region: Secondary | ICD-10-CM | POA: Diagnosis not present

## 2014-12-15 DIAGNOSIS — M9901 Segmental and somatic dysfunction of cervical region: Secondary | ICD-10-CM | POA: Diagnosis not present

## 2014-12-17 DIAGNOSIS — M9903 Segmental and somatic dysfunction of lumbar region: Secondary | ICD-10-CM | POA: Diagnosis not present

## 2014-12-17 DIAGNOSIS — M9901 Segmental and somatic dysfunction of cervical region: Secondary | ICD-10-CM | POA: Diagnosis not present

## 2014-12-17 DIAGNOSIS — M545 Low back pain: Secondary | ICD-10-CM | POA: Diagnosis not present

## 2014-12-17 DIAGNOSIS — M9904 Segmental and somatic dysfunction of sacral region: Secondary | ICD-10-CM | POA: Diagnosis not present

## 2014-12-20 DIAGNOSIS — M9903 Segmental and somatic dysfunction of lumbar region: Secondary | ICD-10-CM | POA: Diagnosis not present

## 2014-12-20 DIAGNOSIS — M9901 Segmental and somatic dysfunction of cervical region: Secondary | ICD-10-CM | POA: Diagnosis not present

## 2014-12-20 DIAGNOSIS — M545 Low back pain: Secondary | ICD-10-CM | POA: Diagnosis not present

## 2014-12-20 DIAGNOSIS — M9904 Segmental and somatic dysfunction of sacral region: Secondary | ICD-10-CM | POA: Diagnosis not present

## 2014-12-21 DIAGNOSIS — H3532 Exudative age-related macular degeneration: Secondary | ICD-10-CM | POA: Diagnosis not present

## 2014-12-22 DIAGNOSIS — M9901 Segmental and somatic dysfunction of cervical region: Secondary | ICD-10-CM | POA: Diagnosis not present

## 2014-12-22 DIAGNOSIS — M9904 Segmental and somatic dysfunction of sacral region: Secondary | ICD-10-CM | POA: Diagnosis not present

## 2014-12-22 DIAGNOSIS — M9903 Segmental and somatic dysfunction of lumbar region: Secondary | ICD-10-CM | POA: Diagnosis not present

## 2014-12-22 DIAGNOSIS — M545 Low back pain: Secondary | ICD-10-CM | POA: Diagnosis not present

## 2014-12-24 DIAGNOSIS — H3532 Exudative age-related macular degeneration: Secondary | ICD-10-CM | POA: Diagnosis not present

## 2014-12-27 ENCOUNTER — Ambulatory Visit (INDEPENDENT_AMBULATORY_CARE_PROVIDER_SITE_OTHER): Payer: Medicare Other | Admitting: Internal Medicine

## 2014-12-27 ENCOUNTER — Encounter: Payer: Self-pay | Admitting: Internal Medicine

## 2014-12-27 VITALS — BP 108/48 | HR 60 | Ht 63.78 in | Wt 156.5 lb

## 2014-12-27 DIAGNOSIS — R1310 Dysphagia, unspecified: Secondary | ICD-10-CM

## 2014-12-27 DIAGNOSIS — K2100 Gastro-esophageal reflux disease with esophagitis, without bleeding: Secondary | ICD-10-CM

## 2014-12-27 DIAGNOSIS — K222 Esophageal obstruction: Secondary | ICD-10-CM

## 2014-12-27 NOTE — Patient Instructions (Signed)
You have been scheduled for an endoscopy. Please follow written instructions given to you at your visit today. If you use inhalers (even only as needed), please bring them with you on the day of your procedure.   

## 2014-12-27 NOTE — Progress Notes (Signed)
HISTORY OF PRESENT ILLNESS:  Adam [name] is a 81 y.o. male who is sent today by his primary care provider, Dr. Virgina Jock, regarding progressive intermittent solid food dysphagia. The patient was last evaluated in October 2007 when he underwent colonoscopy and upper endoscopy. Colonoscopy revealed diverticulosis, internal hemorrhoids, and hyperplastic polyp. Follow-up as needed recommended. Upper endoscopy revealed a 13 mm peptic stricture with 4 cm hiatal hernia. The stricture was dilated to a maximal diameter of 15 mm. He was placed on PPI. He was to undergo repeat esophageal dilation, but not clear that that occurred. He has been on PPI in the form of Nexium. Generally takes 40 mg every other day. He does have intermittent reflux symptoms. His dysphagia is severe at times requiring self-induced emesis for relief. He shows me a large pill which he cannot swallow without symptoms. GI review of systems is otherwise negative. From July 2016 shows unremarkable comprehensive metabolic panel and CBC. Hemoglobin 11.5.  REVIEW OF SYSTEMS:  All non-GI ROS negative except for arthritis, visual change, hearing problems   Past Medical History  Diagnosis Date  . IgM monoclonal gammopathy of uncertain significance ASYMPTOMATIC     FOLLOWED BY DR Alen Blew  . Macrocytic anemia DUE TO CHRONIC RENAL INSUFF.  Marland Kitchen Acute meniscal tear of knee RIGHT KNEE  . Hypertension   . BPH (benign prostatic hypertrophy)   . Nocturia   . Diverticulosis   . Internal hemorrhoids   . History of esophageal stricture S/P DILATION IN 2007  . Right knee pain   . Diabetes mellitus type 2, diet-controlled AND EXERCISE-- LAST A1C  5.6  . GERD (gastroesophageal reflux disease)   . H/O hiatal hernia   . Arthritis   . Colon polyps     hyperplastic    Past Surgical History  Procedure Laterality Date  . Cataract extraction w/ intraocular lens  implant, bilateral  2012  . Knee arthroscopy  12/25/2011    Procedure: ARTHROSCOPY KNEE;  Surgeon:  Tobi Bastos, MD;  Location: [facility];  Service: Orthopedics;  Laterality: Right;  right knee arthroscopy with medial menisectomy    . Hemorrhoid surgery    . Hernia repair      Social History Adam [name]  reports that he has quit smoking. His smoking use included Cigarettes. He has a 10 pack-year smoking history. He has never used smokeless tobacco. He reports that he drinks about 7.0 oz of alcohol per week. He reports that he does not use illicit drugs.  family history includes Diabetes in his brother.  No Known Allergies     PHYSICAL EXAMINATION: Vital signs: BP 108/48 mmHg  Pulse 60  Ht 5' 3.78" (1.62 m)  Wt 156 lb 8 oz (70.988 kg)  BMI 27.05 kg/m2  Constitutional:  pleasant, generally well-appearing, no acute distress Psychiatric: alert and oriented x3, cooperative Eyes: extraocular movements intact, anicteric, conjunctiva pink Mouth: oral pharynx moist, no lesions Neck: supple without thyromegaly Lymph:  no lymphadenopathy Cardiovascular: heart regular rate and rhythm, no murmur Lungs: clear to auscultation bilaterally Abdomen: soft, nontender, nondistended, no obvious ascites, no peritoneal signs, normal bowel sounds, no organomegaly Rectal: ommitted  Extremities: no clubbing, cyanosis or  lower extremity edema bilaterally Skin: no lesions on visible extremities Neuro: No focal deficits. No asterixis.   ASSESSMENT:  #1. Progressive intermittent solid food dysphagia likely secondary to recurrent peptic stricture #2. GERD with history of peptic stricture and hiatal hernia. Currently with intermittent breakthrough symptoms on every other day Nexium  PLAN:  #1. Reflux precautions. Advised  #2. Advised to take Nexium 40 mg daily as opposed every other day   #3. Schedule upper endoscopy with esophageal dilation. The nature of the procedure, as well as the risks, benefits, and alternatives were carefully and thoroughly reviewed with the patient.  Ample time for discussion and questions allowed. The patient understood, was satisfied, and agreed to proceed.  A copy of this consultation note has been sent to Dr. Virgina Jock

## 2014-12-29 DIAGNOSIS — M9903 Segmental and somatic dysfunction of lumbar region: Secondary | ICD-10-CM | POA: Diagnosis not present

## 2014-12-29 DIAGNOSIS — M545 Low back pain: Secondary | ICD-10-CM | POA: Diagnosis not present

## 2014-12-29 DIAGNOSIS — M9904 Segmental and somatic dysfunction of sacral region: Secondary | ICD-10-CM | POA: Diagnosis not present

## 2014-12-29 DIAGNOSIS — M9901 Segmental and somatic dysfunction of cervical region: Secondary | ICD-10-CM | POA: Diagnosis not present

## 2015-01-04 DIAGNOSIS — M545 Low back pain: Secondary | ICD-10-CM | POA: Diagnosis not present

## 2015-01-04 DIAGNOSIS — M9901 Segmental and somatic dysfunction of cervical region: Secondary | ICD-10-CM | POA: Diagnosis not present

## 2015-01-04 DIAGNOSIS — M9904 Segmental and somatic dysfunction of sacral region: Secondary | ICD-10-CM | POA: Diagnosis not present

## 2015-01-04 DIAGNOSIS — M9903 Segmental and somatic dysfunction of lumbar region: Secondary | ICD-10-CM | POA: Diagnosis not present

## 2015-01-06 DIAGNOSIS — L570 Actinic keratosis: Secondary | ICD-10-CM | POA: Diagnosis not present

## 2015-01-06 DIAGNOSIS — Z85828 Personal history of other malignant neoplasm of skin: Secondary | ICD-10-CM | POA: Diagnosis not present

## 2015-01-06 DIAGNOSIS — L821 Other seborrheic keratosis: Secondary | ICD-10-CM | POA: Diagnosis not present

## 2015-01-07 DIAGNOSIS — M9903 Segmental and somatic dysfunction of lumbar region: Secondary | ICD-10-CM | POA: Diagnosis not present

## 2015-01-07 DIAGNOSIS — M9904 Segmental and somatic dysfunction of sacral region: Secondary | ICD-10-CM | POA: Diagnosis not present

## 2015-01-07 DIAGNOSIS — M9901 Segmental and somatic dysfunction of cervical region: Secondary | ICD-10-CM | POA: Diagnosis not present

## 2015-01-07 DIAGNOSIS — M545 Low back pain: Secondary | ICD-10-CM | POA: Diagnosis not present

## 2015-01-11 DIAGNOSIS — M545 Low back pain: Secondary | ICD-10-CM | POA: Diagnosis not present

## 2015-01-11 DIAGNOSIS — M9903 Segmental and somatic dysfunction of lumbar region: Secondary | ICD-10-CM | POA: Diagnosis not present

## 2015-01-11 DIAGNOSIS — M9901 Segmental and somatic dysfunction of cervical region: Secondary | ICD-10-CM | POA: Diagnosis not present

## 2015-01-11 DIAGNOSIS — M9904 Segmental and somatic dysfunction of sacral region: Secondary | ICD-10-CM | POA: Diagnosis not present

## 2015-01-14 DIAGNOSIS — M545 Low back pain: Secondary | ICD-10-CM | POA: Diagnosis not present

## 2015-01-14 DIAGNOSIS — M9904 Segmental and somatic dysfunction of sacral region: Secondary | ICD-10-CM | POA: Diagnosis not present

## 2015-01-14 DIAGNOSIS — M9901 Segmental and somatic dysfunction of cervical region: Secondary | ICD-10-CM | POA: Diagnosis not present

## 2015-01-14 DIAGNOSIS — M9903 Segmental and somatic dysfunction of lumbar region: Secondary | ICD-10-CM | POA: Diagnosis not present

## 2015-01-17 DIAGNOSIS — M545 Low back pain: Secondary | ICD-10-CM | POA: Diagnosis not present

## 2015-01-17 DIAGNOSIS — M9904 Segmental and somatic dysfunction of sacral region: Secondary | ICD-10-CM | POA: Diagnosis not present

## 2015-01-17 DIAGNOSIS — M9903 Segmental and somatic dysfunction of lumbar region: Secondary | ICD-10-CM | POA: Diagnosis not present

## 2015-01-17 DIAGNOSIS — M9901 Segmental and somatic dysfunction of cervical region: Secondary | ICD-10-CM | POA: Diagnosis not present

## 2015-01-24 DIAGNOSIS — M9901 Segmental and somatic dysfunction of cervical region: Secondary | ICD-10-CM | POA: Diagnosis not present

## 2015-01-24 DIAGNOSIS — M9903 Segmental and somatic dysfunction of lumbar region: Secondary | ICD-10-CM | POA: Diagnosis not present

## 2015-01-24 DIAGNOSIS — M9904 Segmental and somatic dysfunction of sacral region: Secondary | ICD-10-CM | POA: Diagnosis not present

## 2015-01-24 DIAGNOSIS — M545 Low back pain: Secondary | ICD-10-CM | POA: Diagnosis not present

## 2015-01-25 DIAGNOSIS — H353211 Exudative age-related macular degeneration, right eye, with active choroidal neovascularization: Secondary | ICD-10-CM | POA: Diagnosis not present

## 2015-01-25 DIAGNOSIS — H353131 Nonexudative age-related macular degeneration, bilateral, early dry stage: Secondary | ICD-10-CM | POA: Diagnosis not present

## 2015-01-25 DIAGNOSIS — H35371 Puckering of macula, right eye: Secondary | ICD-10-CM | POA: Diagnosis not present

## 2015-01-31 DIAGNOSIS — H353221 Exudative age-related macular degeneration, left eye, with active choroidal neovascularization: Secondary | ICD-10-CM | POA: Diagnosis not present

## 2015-01-31 DIAGNOSIS — H353131 Nonexudative age-related macular degeneration, bilateral, early dry stage: Secondary | ICD-10-CM | POA: Diagnosis not present

## 2015-02-01 DIAGNOSIS — M9904 Segmental and somatic dysfunction of sacral region: Secondary | ICD-10-CM | POA: Diagnosis not present

## 2015-02-01 DIAGNOSIS — M9903 Segmental and somatic dysfunction of lumbar region: Secondary | ICD-10-CM | POA: Diagnosis not present

## 2015-02-01 DIAGNOSIS — M9901 Segmental and somatic dysfunction of cervical region: Secondary | ICD-10-CM | POA: Diagnosis not present

## 2015-02-01 DIAGNOSIS — M545 Low back pain: Secondary | ICD-10-CM | POA: Diagnosis not present

## 2015-02-07 DIAGNOSIS — M9903 Segmental and somatic dysfunction of lumbar region: Secondary | ICD-10-CM | POA: Diagnosis not present

## 2015-02-07 DIAGNOSIS — M9904 Segmental and somatic dysfunction of sacral region: Secondary | ICD-10-CM | POA: Diagnosis not present

## 2015-02-07 DIAGNOSIS — M9901 Segmental and somatic dysfunction of cervical region: Secondary | ICD-10-CM | POA: Diagnosis not present

## 2015-02-07 DIAGNOSIS — M545 Low back pain: Secondary | ICD-10-CM | POA: Diagnosis not present

## 2015-02-10 ENCOUNTER — Ambulatory Visit (AMBULATORY_SURGERY_CENTER): Payer: Medicare Other | Admitting: Internal Medicine

## 2015-02-10 ENCOUNTER — Encounter: Payer: Self-pay | Admitting: Internal Medicine

## 2015-02-10 VITALS — BP 174/76 | HR 56 | Temp 96.4°F | Resp 65 | Ht 63.0 in | Wt 156.0 lb

## 2015-02-10 DIAGNOSIS — I10 Essential (primary) hypertension: Secondary | ICD-10-CM | POA: Diagnosis not present

## 2015-02-10 DIAGNOSIS — K2100 Gastro-esophageal reflux disease with esophagitis, without bleeding: Secondary | ICD-10-CM

## 2015-02-10 DIAGNOSIS — K222 Esophageal obstruction: Secondary | ICD-10-CM

## 2015-02-10 DIAGNOSIS — R1310 Dysphagia, unspecified: Secondary | ICD-10-CM | POA: Diagnosis not present

## 2015-02-10 DIAGNOSIS — E119 Type 2 diabetes mellitus without complications: Secondary | ICD-10-CM | POA: Diagnosis not present

## 2015-02-10 MED ORDER — SODIUM CHLORIDE 0.9 % IV SOLN: 500.0000 mL | INTRAVENOUS | Status: DC

## 2015-02-10 NOTE — Op Note (Signed)
[place]  [address]. [place], [address]   ENDOSCOPY PROCEDURE REPORT  PATIENT: Adam [name], Adam [name]  MR#: [id number] BIRTHDATE: Feb 20, 1931 , 84  yrs. old GENDER: male ENDOSCOPIST: Eustace Quail, MD REFERRED BY:  .  Self / Office PROCEDURE DATE:  02/10/2015 PROCEDURE:  EGD w/ balloon dilation   - 16,17,25mm ASA CLASS:     Class II INDICATIONS:  dysphagia and therapeutic procedure. MEDICATIONS: Monitored anesthesia care and Propofol 100 mg IV TOPICAL ANESTHETIC: none  DESCRIPTION OF PROCEDURE: After the risks benefits and alternatives of the procedure were thoroughly explained, informed consent was obtained.  The [id number] endoscope was introduced through the mouth and advanced to the second portion of the duodenum , Without limitations.  The instrument was slowly withdrawn as the mucosa was fully examined.  EXAM:Esophagus revealed a dense ringlike stricture at the gastroesophageal junction (34 cm) measuring approximately 14 mm. Stomach and duodenum were normal.  Retroflexed views revealed a hiatal hernia. THERAPY: A sequential TTS balloon was used to dilate the stricture at diameters of 16, 17, and 18 mm. The stricture was disrupted with heme present. Tolerated well.   The scope was then withdrawn from the patient and the procedure completed.  COMPLICATIONS: There were no immediate complications.  ENDOSCOPIC IMPRESSION: 1. GERD complicated by peptic stricture status post dilation to 18 mm  RECOMMENDATIONS: 1.  Clear liquids until 12 noon today, then soft foods rest of day. Resume prior diet tomorrow. 2.  Continue Nexium daily (or every other day if no reflux symptoms) 3. Please contact Dr. Henrene Pastor if you have persistent or recurrent swallowing difficulties  REPEAT EXAM:  eSigned:  Eustace Quail, MD 02/10/2015 10:09 AM    [id number] Virgina Jock, MD and The Patient

## 2015-02-10 NOTE — Patient Instructions (Signed)
YOU HAD AN ENDOSCOPIC PROCEDURE TODAY AT [facility]:   Refer to the procedure report that was given to you for any specific questions about what was found during the examination.  If the procedure report does not answer your questions, please call your gastroenterologist to clarify.  If you requested that your care partner not be given the details of your procedure findings, then the procedure report has been included in a sealed envelope for you to review at your convenience later.  YOU SHOULD EXPECT: Some feelings of bloating in the abdomen. Passage of more gas than usual.  Walking can help get rid of the air that was put into your GI tract during the procedure and reduce the bloating. If you had a lower endoscopy (such as a colonoscopy or flexible sigmoidoscopy) you may notice spotting of blood in your stool or on the toilet paper. If you underwent a bowel prep for your procedure, you may not have a normal bowel movement for a few days.  Please Note:  You might notice some irritation and congestion in your nose or some drainage.  This is from the oxygen used during your procedure.  There is no need for concern and it should clear up in a day or so.  SYMPTOMS TO REPORT IMMEDIATELY:   Following upper endoscopy (EGD)  Vomiting of blood or coffee ground material  New chest pain or pain under the shoulder blades  Painful or persistently difficult swallowing  New shortness of breath  Fever of 100F or higher  Black, tarry-looking stools  For urgent or emergent issues, a gastroenterologist can be reached at any hour by calling [phone].   DIET: Follow Dilation diet  ACTIVITY:  You should plan to take it easy for the rest of today and you should NOT DRIVE or use heavy machinery until tomorrow (because of the sedation medicines used during the test).    FOLLOW UP: Our staff will call the number listed on your records the next business day following your procedure to check  on you and address any questions or concerns that you may have regarding the information given to you following your procedure. If we do not reach you, we will leave a message.  However, if you are feeling well and you are not experiencing any problems, there is no need to return our call.  We will assume that you have returned to your regular daily activities without incident.  If any biopsies were taken you will be contacted by phone or by letter within the next 1-3 weeks.  Please call us at [phone] if you have not heard about the biopsies in 3 weeks.    SIGNATURES/CONFIDENTIALITY: You and/or your care partner have signed paperwork which will be entered into your electronic medical record.  These signatures attest to the fact that that the information above on your After Visit Summary has been reviewed and is understood.  Full responsibility of the confidentiality of this discharge information lies with you and/or your care-partner.  Follow Dilation diet handout Continue Nexium daily

## 2015-02-10 NOTE — Progress Notes (Signed)
Patient awakening,vss,report to rn 

## 2015-02-11 ENCOUNTER — Telehealth: Payer: Self-pay | Admitting: Emergency Medicine

## 2015-02-11 NOTE — Telephone Encounter (Signed)
  Follow up Call-  Call back number 02/10/2015  Post procedure Call Back phone  # [phone]  Permission to leave phone message Yes     Patient questions:  Do you have a fever, pain , or abdominal swelling? No. Pain Score  0 *  Have you tolerated food without any problems? Yes.    Have you been able to return to your normal activities? Yes.    Do you have any questions about your discharge instructions: Diet   No. Medications  No. Follow up visit  No.  Do you have questions or concerns about your Care? No.  Actions: * If pain score is 4 or above: No action needed, pain <4.

## 2015-02-16 DIAGNOSIS — M545 Low back pain: Secondary | ICD-10-CM | POA: Diagnosis not present

## 2015-02-16 DIAGNOSIS — M9901 Segmental and somatic dysfunction of cervical region: Secondary | ICD-10-CM | POA: Diagnosis not present

## 2015-02-16 DIAGNOSIS — M9904 Segmental and somatic dysfunction of sacral region: Secondary | ICD-10-CM | POA: Diagnosis not present

## 2015-02-16 DIAGNOSIS — M9903 Segmental and somatic dysfunction of lumbar region: Secondary | ICD-10-CM | POA: Diagnosis not present

## 2015-03-01 DIAGNOSIS — H353211 Exudative age-related macular degeneration, right eye, with active choroidal neovascularization: Secondary | ICD-10-CM | POA: Diagnosis not present

## 2015-03-14 DIAGNOSIS — H353221 Exudative age-related macular degeneration, left eye, with active choroidal neovascularization: Secondary | ICD-10-CM | POA: Diagnosis not present

## 2015-04-05 DIAGNOSIS — H353221 Exudative age-related macular degeneration, left eye, with active choroidal neovascularization: Secondary | ICD-10-CM | POA: Diagnosis not present

## 2015-04-05 DIAGNOSIS — H353211 Exudative age-related macular degeneration, right eye, with active choroidal neovascularization: Secondary | ICD-10-CM | POA: Diagnosis not present

## 2015-04-05 DIAGNOSIS — H43821 Vitreomacular adhesion, right eye: Secondary | ICD-10-CM | POA: Diagnosis not present

## 2015-04-18 DIAGNOSIS — M109 Gout, unspecified: Secondary | ICD-10-CM | POA: Diagnosis not present

## 2015-04-18 DIAGNOSIS — K219 Gastro-esophageal reflux disease without esophagitis: Secondary | ICD-10-CM | POA: Diagnosis not present

## 2015-04-18 DIAGNOSIS — I10 Essential (primary) hypertension: Secondary | ICD-10-CM | POA: Diagnosis not present

## 2015-04-18 DIAGNOSIS — N183 Chronic kidney disease, stage 3 (moderate): Secondary | ICD-10-CM | POA: Diagnosis not present

## 2015-04-18 DIAGNOSIS — Z1389 Encounter for screening for other disorder: Secondary | ICD-10-CM | POA: Diagnosis not present

## 2015-04-18 DIAGNOSIS — E1129 Type 2 diabetes mellitus with other diabetic kidney complication: Secondary | ICD-10-CM | POA: Diagnosis not present

## 2015-04-18 DIAGNOSIS — E784 Other hyperlipidemia: Secondary | ICD-10-CM | POA: Diagnosis not present

## 2015-04-18 DIAGNOSIS — Z6826 Body mass index (BMI) 26.0-26.9, adult: Secondary | ICD-10-CM | POA: Diagnosis not present

## 2015-04-18 DIAGNOSIS — H3530 Unspecified macular degeneration: Secondary | ICD-10-CM | POA: Diagnosis not present

## 2015-04-18 DIAGNOSIS — I129 Hypertensive chronic kidney disease with stage 1 through stage 4 chronic kidney disease, or unspecified chronic kidney disease: Secondary | ICD-10-CM | POA: Diagnosis not present

## 2015-04-18 DIAGNOSIS — D692 Other nonthrombocytopenic purpura: Secondary | ICD-10-CM | POA: Diagnosis not present

## 2015-04-18 DIAGNOSIS — D472 Monoclonal gammopathy: Secondary | ICD-10-CM | POA: Diagnosis not present

## 2015-05-18 DIAGNOSIS — H353211 Exudative age-related macular degeneration, right eye, with active choroidal neovascularization: Secondary | ICD-10-CM | POA: Diagnosis not present

## 2015-06-22 DIAGNOSIS — H353211 Exudative age-related macular degeneration, right eye, with active choroidal neovascularization: Secondary | ICD-10-CM | POA: Diagnosis not present

## 2015-06-22 DIAGNOSIS — H353131 Nonexudative age-related macular degeneration, bilateral, early dry stage: Secondary | ICD-10-CM | POA: Diagnosis not present

## 2015-07-28 DIAGNOSIS — H353131 Nonexudative age-related macular degeneration, bilateral, early dry stage: Secondary | ICD-10-CM | POA: Diagnosis not present

## 2015-07-28 DIAGNOSIS — H353211 Exudative age-related macular degeneration, right eye, with active choroidal neovascularization: Secondary | ICD-10-CM | POA: Diagnosis not present

## 2015-09-19 DIAGNOSIS — H353211 Exudative age-related macular degeneration, right eye, with active choroidal neovascularization: Secondary | ICD-10-CM | POA: Diagnosis not present

## 2015-10-17 DIAGNOSIS — Z125 Encounter for screening for malignant neoplasm of prostate: Secondary | ICD-10-CM | POA: Diagnosis not present

## 2015-10-17 DIAGNOSIS — E784 Other hyperlipidemia: Secondary | ICD-10-CM | POA: Diagnosis not present

## 2015-10-17 DIAGNOSIS — E1129 Type 2 diabetes mellitus with other diabetic kidney complication: Secondary | ICD-10-CM | POA: Diagnosis not present

## 2015-10-17 DIAGNOSIS — M109 Gout, unspecified: Secondary | ICD-10-CM | POA: Diagnosis not present

## 2015-10-17 DIAGNOSIS — N183 Chronic kidney disease, stage 3 (moderate): Secondary | ICD-10-CM | POA: Diagnosis not present

## 2015-10-20 DIAGNOSIS — Z85828 Personal history of other malignant neoplasm of skin: Secondary | ICD-10-CM | POA: Diagnosis not present

## 2015-10-20 DIAGNOSIS — C44722 Squamous cell carcinoma of skin of right lower limb, including hip: Secondary | ICD-10-CM | POA: Diagnosis not present

## 2015-10-20 DIAGNOSIS — L570 Actinic keratosis: Secondary | ICD-10-CM | POA: Diagnosis not present

## 2015-10-20 DIAGNOSIS — D485 Neoplasm of uncertain behavior of skin: Secondary | ICD-10-CM | POA: Diagnosis not present

## 2015-10-24 DIAGNOSIS — I129 Hypertensive chronic kidney disease with stage 1 through stage 4 chronic kidney disease, or unspecified chronic kidney disease: Secondary | ICD-10-CM | POA: Diagnosis not present

## 2015-10-24 DIAGNOSIS — E1129 Type 2 diabetes mellitus with other diabetic kidney complication: Secondary | ICD-10-CM | POA: Diagnosis not present

## 2015-10-24 DIAGNOSIS — N183 Chronic kidney disease, stage 3 (moderate): Secondary | ICD-10-CM | POA: Diagnosis not present

## 2015-10-24 DIAGNOSIS — K222 Esophageal obstruction: Secondary | ICD-10-CM | POA: Diagnosis not present

## 2015-10-24 DIAGNOSIS — R351 Nocturia: Secondary | ICD-10-CM | POA: Diagnosis not present

## 2015-10-24 DIAGNOSIS — R809 Proteinuria, unspecified: Secondary | ICD-10-CM | POA: Diagnosis not present

## 2015-10-24 DIAGNOSIS — D472 Monoclonal gammopathy: Secondary | ICD-10-CM | POA: Diagnosis not present

## 2015-10-24 DIAGNOSIS — R2689 Other abnormalities of gait and mobility: Secondary | ICD-10-CM | POA: Diagnosis not present

## 2015-10-24 DIAGNOSIS — Z0000 Encounter for general adult medical examination without abnormal findings: Secondary | ICD-10-CM | POA: Diagnosis not present

## 2015-10-24 DIAGNOSIS — D692 Other nonthrombocytopenic purpura: Secondary | ICD-10-CM | POA: Diagnosis not present

## 2015-10-24 DIAGNOSIS — C439 Malignant melanoma of skin, unspecified: Secondary | ICD-10-CM | POA: Diagnosis not present

## 2015-10-24 DIAGNOSIS — H3530 Unspecified macular degeneration: Secondary | ICD-10-CM | POA: Diagnosis not present

## 2015-10-31 DIAGNOSIS — H353211 Exudative age-related macular degeneration, right eye, with active choroidal neovascularization: Secondary | ICD-10-CM | POA: Diagnosis not present

## 2015-12-15 ENCOUNTER — Ambulatory Visit (INDEPENDENT_AMBULATORY_CARE_PROVIDER_SITE_OTHER): Payer: Medicare Other | Admitting: Internal Medicine

## 2015-12-15 ENCOUNTER — Encounter (INDEPENDENT_AMBULATORY_CARE_PROVIDER_SITE_OTHER): Payer: Self-pay

## 2015-12-15 VITALS — Resp 16 | Wt 154.0 lb

## 2015-12-15 DIAGNOSIS — R0602 Shortness of breath: Secondary | ICD-10-CM | POA: Diagnosis not present

## 2015-12-15 DIAGNOSIS — R42 Dizziness and giddiness: Secondary | ICD-10-CM

## 2015-12-15 LAB — CBC
HCT: 35.5 % — ABNORMAL LOW (ref 38.5–50.0)
Hemoglobin: 12.1 g/dL — ABNORMAL LOW (ref 13.2–17.1)
MCH: 32.3 pg (ref 27.0–33.0)
MCHC: 34.1 g/dL (ref 32.0–36.0)
MCV: 94.7 fL (ref 80.0–100.0)
MPV: 8.4 fL (ref 7.5–12.5)
Platelets: 150 10*3/uL (ref 140–400)
RBC: 3.75 MIL/uL — ABNORMAL LOW (ref 4.20–5.80)
RDW: 13.3 % (ref 11.0–15.0)
WBC: 5.8 10*3/uL (ref 3.8–10.8)

## 2015-12-15 LAB — BASIC METABOLIC PANEL
BUN: 22 mg/dL (ref 7–25)
CO2: 26 mmol/L (ref 20–31)
Calcium: 8.8 mg/dL (ref 8.6–10.3)
Chloride: 106 mmol/L (ref 98–110)
Creat: 1.64 mg/dL — ABNORMAL HIGH (ref 0.70–1.11)
Glucose, Bld: 97 mg/dL (ref 65–99)
Potassium: 4.3 mmol/L (ref 3.5–5.3)
Sodium: 140 mmol/L (ref 135–146)

## 2015-12-15 NOTE — Patient Instructions (Addendum)
Your physician has recommended you make the following change in your medication:  1.) STOP LASIX 2.) DECREASE CLONIDINE TO 1/2 TABLET TWO TIMES A DAY  Your physician recommends that you have lab work today (CBC, BMET, [place], UA)  Your physician has requested that you have an echocardiogram. Echocardiography is a painless test that uses sound waves to create images of your heart. It provides your doctor with information about the size and shape of your heart and how well your heart's chambers and valves are working. This procedure takes approximately one hour. There are no restrictions for this procedure. Marland Kitchen

## 2015-12-15 NOTE — Progress Notes (Signed)
Cardiology Office Note   Date:  12/17/2015   ID:  Adam [name], DOB 17-Oct-1930, MRN [id number]  PCP:  Precious Reel, MD  Cardiologist:   Dorris Carnes, MD   Patient presents for eval of dizziness   History of Present Illness: Adam [name] is a 82 y.o. male with a history of unsteadiness and fatigue  Last time about 1 year ago   No palpitations   Some SOB   Exercises 3x per week  (Bends/bones and balance, some dizziness  NO SOB)  t Not with doing exercise  More with walking to car  Has to take breathg    Dizzy, tired for 6 to 12 months One month ago winding clock  Turned and fell  Cut arm (July) Fell against door jam  Did not pass out  In [place] spings graduation unsteady  Not dizzy    Went to [place]   Bicarbonate in elderly to be Part of study  (eval)  Didn't eat   Didn't take meds  BP was 220/  Ddnt take am meds  Only One other time happened.  Tading sodium bicarb and not nexium  BP for 30 t o40 years      Outpatient Medications Prior to Visit  Medication Sig Dispense Refill  . Ascorbic Acid (VITAMIN C) 1000 MG tablet Take 500 mg by mouth daily.     . cloNIDine (CATAPRES) 0.1 MG tablet Take 0.5 tablets by mouth 2 (two) times daily.    . finasteride (PROSCAR) 5 MG tablet     . labetalol (NORMODYNE) 100 MG tablet Take 100 mg by mouth 2 (two) times daily.    . Multiple Vitamins-Minerals (PRESERVISION AREDS PO) Take by mouth 1 day or 1 dose.    . nystatin ointment (MYCOSTATIN) Apply 1 application topically 2 (two) times daily.    . Omega-3 Fatty Acids (FISH OIL) 1000 MG CAPS Take 1,000 mg by mouth 3 (three) times daily.    . PSYLLIUM HUSK PO Take by mouth.    . Febuxostat (ULORIC) 80 MG TABS Take 80 mg by mouth daily.    . furosemide (LASIX) 20 MG tablet Take 10 mg by mouth.     . esomeprazole (NEXIUM) 40 MG capsule Take 40 mg by mouth every evening.     . simvastatin (ZOCOR) 40 MG tablet Take 20 mg by mouth every evening.      No facility-administered medications prior to  visit.      Allergies:   Review of patient's allergies indicates no known allergies.   Past Medical History:  Diagnosis Date  . Acute meniscal tear of knee RIGHT KNEE  . Arthritis   . BPH (benign prostatic hypertrophy)   . Colon polyps    hyperplastic  . Diabetes mellitus type 2, diet-controlled (HCC) AND EXERCISE-- LAST A1C  5.6  . Diverticulosis   . GERD (gastroesophageal reflux disease)   . H/O hiatal hernia   . History of esophageal stricture S/P DILATION IN 2007  . Hypertension   . IgM monoclonal gammopathy of uncertain significance ASYMPTOMATIC    FOLLOWED BY DR Alen Blew  . Internal hemorrhoids   . Macrocytic anemia DUE TO CHRONIC RENAL INSUFF.  . Nocturia   . Right knee pain     Past Surgical History:  Procedure Laterality Date  . CATARACT EXTRACTION W/ INTRAOCULAR LENS  IMPLANT, BILATERAL  2012  . HEMORRHOID SURGERY    . HERNIA REPAIR    . KNEE ARTHROSCOPY  12/25/2011   Procedure: ARTHROSCOPY KNEE;  Surgeon: Tobi Bastos, MD;  Location: [facility];  Service: Orthopedics;  Laterality: Right;  right knee arthroscopy with medial menisectomy       Social History:  The patient  reports that he has quit smoking. His smoking use included Cigarettes. He has a 10.00 pack-year smoking history. He has never used smokeless tobacco. He reports that he drinks about 7.0 oz of alcohol per week . He reports that he does not use drugs.   Family History:  The patient's family history includes Diabetes in his brother.    ROS:  Please see the history of present illness. All other systems are reviewed and  Negative to the above problem except as noted.    PHYSICAL EXAM: VS:  Resp 16   Wt 154 lb (69.9 kg)   BMI 27.28 kg/m    BP 148/80   P 56   GEN: Well nourished, well developed, in no acute distress  HEENT: normal  Neck: no JVD, carotid bruits, or masses Cardiac: RRR; no murmurs, rubs, or gallops,no edema  Respiratory:  clear to auscultation bilaterally,  normal work of breathing GI: soft, nontender, nondistended, + BS  No hepatomegaly  MS: no deformity Moving all extremities   Skin: warm and dry, no rash Neuro:  Strength and sensation are intact Psych: euthymic mood, full affect   EKG:  EKG is ordered today.  SB 58 bpm     Lipid Panel No results found for: CHOL, TRIG, HDL, CHOLHDL, VLDL, LDLCALC, LDLDIRECT    Wt Readings from Last 3 Encounters:  12/15/15 154 lb (69.9 kg)  02/10/15 156 lb (70.8 kg)  12/27/14 156 lb 8 oz (71 kg)      ASSESSMENT AND PLAN:  1  Dizziness  Pt does show drop in BP today with standing  Initially BP 148/70  P 56  With sitting BP 128/70  P 65  Standing 108/70  P 65  Pt symptomatic  On walking in hal  HR increased to 71  Pt was unsteady  I am concerned about 1.  Orthostatic intolerance 2 some degree of vestibular problems,  3 some orthopedic problem with hip problems  In regards to orthostatic issue I recomm stop furosemide  Cut back on clonidine to 0.05 bid  Continue labetalol for now.  Will check CBC, BMET and UA  Encouraged pt to take change in position slower,  Recomm stay active   Support socks Increase fluid and salt intake  Discussed vestibular rehab or neuro eval  Pt has PT at [place]  Will attempt to contact therapist  He has not sought ortho eval for hip  2.  SOB   May reflect symptom from problem 1  Will schedule echo   3  CKD  Will get UA and BMET   Cut back on furosemide  Get records from Dr Virgina Jock  4  HTN  BP appears labile  Cut back on meds some and follow  Encouraged pt to take BP at home esp if symptomatic  5.  BPH  Currently on finesteride which has 10% incidence of dizziness  Proscar per his report lead to incontinence   Has not been seen by urology.    F/U based on test results     Labs/ tests ordered today include:  Orders Placed This Encounter  Procedures  . Basic metabolic panel  . CBC  . Urinalysis  . Cortisol  . ECHOCARDIOGRAM COMPLETE       Signed, Dorris Carnes, MD  12/17/2015 10:06 AM    [address], [place], [place]  [address] Phone: [phone]; Fax: [phone]

## 2015-12-16 LAB — URINALYSIS
Bilirubin Urine: NEGATIVE
Glucose, UA: NEGATIVE
Hgb urine dipstick: NEGATIVE
Ketones, ur: NEGATIVE
Leukocytes, UA: NEGATIVE
Nitrite: NEGATIVE
Specific Gravity, Urine: 1.014 (ref 1.001–1.035)
pH: 6.5 (ref 5.0–8.0)

## 2015-12-16 LAB — CORTISOL: Cortisol, Plasma: 12.2 ug/dL

## 2015-12-19 DIAGNOSIS — H353211 Exudative age-related macular degeneration, right eye, with active choroidal neovascularization: Secondary | ICD-10-CM | POA: Diagnosis not present

## 2015-12-21 DIAGNOSIS — H353221 Exudative age-related macular degeneration, left eye, with active choroidal neovascularization: Secondary | ICD-10-CM | POA: Diagnosis not present

## 2015-12-28 ENCOUNTER — Other Ambulatory Visit: Payer: Self-pay

## 2015-12-28 ENCOUNTER — Ambulatory Visit (HOSPITAL_COMMUNITY): Payer: Medicare Other | Attending: Cardiovascular Disease

## 2015-12-28 DIAGNOSIS — I352 Nonrheumatic aortic (valve) stenosis with insufficiency: Secondary | ICD-10-CM | POA: Insufficient documentation

## 2015-12-28 DIAGNOSIS — E119 Type 2 diabetes mellitus without complications: Secondary | ICD-10-CM | POA: Insufficient documentation

## 2015-12-28 DIAGNOSIS — I119 Hypertensive heart disease without heart failure: Secondary | ICD-10-CM | POA: Insufficient documentation

## 2015-12-28 DIAGNOSIS — Z87891 Personal history of nicotine dependence: Secondary | ICD-10-CM | POA: Diagnosis not present

## 2015-12-28 DIAGNOSIS — R42 Dizziness and giddiness: Secondary | ICD-10-CM

## 2015-12-28 DIAGNOSIS — R0602 Shortness of breath: Secondary | ICD-10-CM | POA: Diagnosis not present

## 2015-12-28 NOTE — Addendum Note (Signed)
Addended by: Rodman Key on: 12/28/2015 02:11 PM   Modules accepted: Orders

## 2016-01-01 ENCOUNTER — Telehealth: Payer: Self-pay | Admitting: Internal Medicine

## 2016-01-01 NOTE — Telephone Encounter (Signed)
Spoke to patient  BP Laying :   200/60   P 56 Sitting  BP 200/60  P 60 Standing BP 200/65  P 60  Pt asymptomatiic Not dizzy since off of lasix  I would reocmm starting amlodipine 2.5 mg   Needs to get new BP cuff  His a a little erratic  Instructedhim to email back in a couple weeks with BP and how he is feeling

## 2016-01-02 ENCOUNTER — Other Ambulatory Visit: Payer: Self-pay | Admitting: *Deleted

## 2016-01-02 MED ORDER — AMLODIPINE BESYLATE 2.5 MG PO TABS: 2.5000 mg | ORAL_TABLET | Freq: Every day | ORAL | 3 refills | Status: DC

## 2016-01-02 NOTE — Telephone Encounter (Signed)
Sent prescription for amlodipine 2.5 mg once a day to patient's pharmacy.

## 2016-01-02 NOTE — Telephone Encounter (Signed)
Called patient and left message that medication has been sent to pharmacy.

## 2016-01-12 ENCOUNTER — Other Ambulatory Visit: Payer: Self-pay | Admitting: Internal Medicine

## 2016-01-12 MED ORDER — AMLODIPINE BESYLATE 2.5 MG PO TABS: 2.5000 mg | ORAL_TABLET | Freq: Every day | ORAL | 3 refills | Status: DC

## 2016-01-12 NOTE — Telephone Encounter (Signed)
Rx resent to pt's requested pharmacy, [facility]. Confirmation received.

## 2016-01-26 DIAGNOSIS — H353131 Nonexudative age-related macular degeneration, bilateral, early dry stage: Secondary | ICD-10-CM | POA: Diagnosis not present

## 2016-01-26 DIAGNOSIS — H353211 Exudative age-related macular degeneration, right eye, with active choroidal neovascularization: Secondary | ICD-10-CM | POA: Diagnosis not present

## 2016-01-26 DIAGNOSIS — H35371 Puckering of macula, right eye: Secondary | ICD-10-CM | POA: Diagnosis not present

## 2016-01-26 DIAGNOSIS — H353221 Exudative age-related macular degeneration, left eye, with active choroidal neovascularization: Secondary | ICD-10-CM | POA: Diagnosis not present

## 2016-02-01 DIAGNOSIS — H35371 Puckering of macula, right eye: Secondary | ICD-10-CM | POA: Diagnosis not present

## 2016-02-01 DIAGNOSIS — H353131 Nonexudative age-related macular degeneration, bilateral, early dry stage: Secondary | ICD-10-CM | POA: Diagnosis not present

## 2016-02-01 DIAGNOSIS — H353221 Exudative age-related macular degeneration, left eye, with active choroidal neovascularization: Secondary | ICD-10-CM | POA: Diagnosis not present

## 2016-02-01 DIAGNOSIS — H353211 Exudative age-related macular degeneration, right eye, with active choroidal neovascularization: Secondary | ICD-10-CM | POA: Diagnosis not present

## 2016-02-29 DIAGNOSIS — H353221 Exudative age-related macular degeneration, left eye, with active choroidal neovascularization: Secondary | ICD-10-CM | POA: Diagnosis not present

## 2016-03-08 DIAGNOSIS — H353211 Exudative age-related macular degeneration, right eye, with active choroidal neovascularization: Secondary | ICD-10-CM | POA: Diagnosis not present

## 2016-03-08 DIAGNOSIS — H353131 Nonexudative age-related macular degeneration, bilateral, early dry stage: Secondary | ICD-10-CM | POA: Diagnosis not present

## 2016-03-08 DIAGNOSIS — H353221 Exudative age-related macular degeneration, left eye, with active choroidal neovascularization: Secondary | ICD-10-CM | POA: Diagnosis not present

## 2016-04-11 DIAGNOSIS — H353221 Exudative age-related macular degeneration, left eye, with active choroidal neovascularization: Secondary | ICD-10-CM | POA: Diagnosis not present

## 2016-04-23 DIAGNOSIS — C439 Malignant melanoma of skin, unspecified: Secondary | ICD-10-CM | POA: Diagnosis not present

## 2016-04-23 DIAGNOSIS — E784 Other hyperlipidemia: Secondary | ICD-10-CM | POA: Diagnosis not present

## 2016-04-23 DIAGNOSIS — L298 Other pruritus: Secondary | ICD-10-CM | POA: Diagnosis not present

## 2016-04-23 DIAGNOSIS — D698 Other specified hemorrhagic conditions: Secondary | ICD-10-CM | POA: Diagnosis not present

## 2016-04-23 DIAGNOSIS — I10 Essential (primary) hypertension: Secondary | ICD-10-CM | POA: Diagnosis not present

## 2016-04-23 DIAGNOSIS — R2689 Other abnormalities of gait and mobility: Secondary | ICD-10-CM | POA: Diagnosis not present

## 2016-04-23 DIAGNOSIS — I129 Hypertensive chronic kidney disease with stage 1 through stage 4 chronic kidney disease, or unspecified chronic kidney disease: Secondary | ICD-10-CM | POA: Diagnosis not present

## 2016-04-23 DIAGNOSIS — N183 Chronic kidney disease, stage 3 (moderate): Secondary | ICD-10-CM | POA: Diagnosis not present

## 2016-04-23 DIAGNOSIS — E1129 Type 2 diabetes mellitus with other diabetic kidney complication: Secondary | ICD-10-CM | POA: Diagnosis not present

## 2016-04-23 DIAGNOSIS — D472 Monoclonal gammopathy: Secondary | ICD-10-CM | POA: Diagnosis not present

## 2016-04-23 DIAGNOSIS — M109 Gout, unspecified: Secondary | ICD-10-CM | POA: Diagnosis not present

## 2016-04-23 DIAGNOSIS — Z6827 Body mass index (BMI) 27.0-27.9, adult: Secondary | ICD-10-CM | POA: Diagnosis not present

## 2016-04-24 DIAGNOSIS — D472 Monoclonal gammopathy: Secondary | ICD-10-CM | POA: Diagnosis not present

## 2016-04-26 DIAGNOSIS — H353211 Exudative age-related macular degeneration, right eye, with active choroidal neovascularization: Secondary | ICD-10-CM | POA: Diagnosis not present

## 2016-05-03 DIAGNOSIS — Z85828 Personal history of other malignant neoplasm of skin: Secondary | ICD-10-CM | POA: Diagnosis not present

## 2016-05-03 DIAGNOSIS — L821 Other seborrheic keratosis: Secondary | ICD-10-CM | POA: Diagnosis not present

## 2016-05-03 DIAGNOSIS — L308 Other specified dermatitis: Secondary | ICD-10-CM | POA: Diagnosis not present

## 2016-05-03 DIAGNOSIS — L570 Actinic keratosis: Secondary | ICD-10-CM | POA: Diagnosis not present

## 2016-05-07 DIAGNOSIS — I10 Essential (primary) hypertension: Secondary | ICD-10-CM | POA: Diagnosis not present

## 2016-05-07 DIAGNOSIS — Z6826 Body mass index (BMI) 26.0-26.9, adult: Secondary | ICD-10-CM | POA: Diagnosis not present

## 2016-05-07 DIAGNOSIS — R2689 Other abnormalities of gait and mobility: Secondary | ICD-10-CM | POA: Diagnosis not present

## 2016-05-07 DIAGNOSIS — R42 Dizziness and giddiness: Secondary | ICD-10-CM | POA: Diagnosis not present

## 2016-05-08 ENCOUNTER — Ambulatory Visit (HOSPITAL_BASED_OUTPATIENT_CLINIC_OR_DEPARTMENT_OTHER)
Admission: RE | Admit: 2016-05-08 | Discharge: 2016-05-08 | Disposition: A | Discharge status: Home / Self Care | Payer: Medicare Other | Source: Ambulatory Visit | Attending: Internal Medicine | Admitting: Internal Medicine

## 2016-05-08 ENCOUNTER — Other Ambulatory Visit (HOSPITAL_BASED_OUTPATIENT_CLINIC_OR_DEPARTMENT_OTHER): Payer: Self-pay | Admitting: Registered Nurse

## 2016-05-08 ENCOUNTER — Other Ambulatory Visit (HOSPITAL_BASED_OUTPATIENT_CLINIC_OR_DEPARTMENT_OTHER): Payer: Self-pay | Admitting: Internal Medicine

## 2016-05-08 DIAGNOSIS — G319 Degenerative disease of nervous system, unspecified: Secondary | ICD-10-CM | POA: Diagnosis not present

## 2016-05-08 DIAGNOSIS — R42 Dizziness and giddiness: Secondary | ICD-10-CM

## 2016-05-08 DIAGNOSIS — G9389 Other specified disorders of brain: Secondary | ICD-10-CM | POA: Diagnosis not present

## 2016-05-08 DIAGNOSIS — R269 Unspecified abnormalities of gait and mobility: Secondary | ICD-10-CM | POA: Diagnosis not present

## 2016-05-23 DIAGNOSIS — H353221 Exudative age-related macular degeneration, left eye, with active choroidal neovascularization: Secondary | ICD-10-CM | POA: Diagnosis not present

## 2016-06-18 DIAGNOSIS — H353211 Exudative age-related macular degeneration, right eye, with active choroidal neovascularization: Secondary | ICD-10-CM | POA: Diagnosis not present

## 2016-06-21 ENCOUNTER — Encounter: Payer: Self-pay | Admitting: Neurology

## 2016-06-21 ENCOUNTER — Encounter (INDEPENDENT_AMBULATORY_CARE_PROVIDER_SITE_OTHER): Payer: Self-pay

## 2016-06-21 ENCOUNTER — Ambulatory Visit (INDEPENDENT_AMBULATORY_CARE_PROVIDER_SITE_OTHER): Payer: Medicare Other | Admitting: Neurology

## 2016-06-21 VITALS — BP 144/60 | HR 55 | Ht 63.0 in | Wt 155.0 lb

## 2016-06-21 DIAGNOSIS — R42 Dizziness and giddiness: Secondary | ICD-10-CM

## 2016-06-21 DIAGNOSIS — E538 Deficiency of other specified B group vitamins: Secondary | ICD-10-CM

## 2016-06-21 DIAGNOSIS — R269 Unspecified abnormalities of gait and mobility: Secondary | ICD-10-CM

## 2016-06-21 NOTE — Patient Instructions (Signed)
   We will get blood work today and get MRI of the brain. 

## 2016-06-21 NOTE — Progress Notes (Signed)
Reason for visit: Gait disorder  Referring physician: Dr. Shon Baton [name] is a 83 y.o. male  History of present illness:  Adam [name] is an 83 year old right-handed white male with a history of onset of a gait disorder that occurred on 05/05/2016. The patient indicates that the onset was sudden in nature. The patient indicated that he walked down to the lunch room to have lunch, and when he got up to walk back to his room, he had problems with gait instability, and he was quite staggery. He has not had any falls, but the gait instability has been persistent since that time. He denied any headache, visual disturbances, loss of vision, or any numbness or weakness of the face, arms, or legs around the time of onset. The patient has not had any neck pain or low back pain or pain down arms or legs. He denies any dizziness or true vertigo. The patient is not using a cane or walker for ambulation, but he still feels that his balance has not completely improved. He denies any blackout episodes. He denies any change in control the bowels or the bladder. He has undergone a CT scan of the brain that was reviewed online. This shows generalized cortical atrophy with hydrocephalus ex vacuo. The radiologist questioned normal pressure hydrocephalus, I believe that the study is not fully consistent with this. The patient did have one episode of nausea and vomiting around the time of a period of exercise in March 2018.  Past Medical History:  Diagnosis Date  . Acute meniscal tear of knee RIGHT KNEE  . Arthritis   . BPH (benign prostatic hypertrophy)   . Colon polyps    hyperplastic  . Diabetes mellitus type 2, diet-controlled (HCC) AND EXERCISE-- LAST A1C  5.6  . Diverticulosis   . GERD (gastroesophageal reflux disease)   . H/O hiatal hernia   . History of esophageal stricture S/P DILATION IN 2007  . Hypertension   . IgM monoclonal gammopathy of uncertain significance ASYMPTOMATIC    FOLLOWED BY DR  Adam [name]  . Internal hemorrhoids   . Macrocytic anemia DUE TO CHRONIC RENAL INSUFF.  . Nocturia   . Right knee pain     Past Surgical History:  Procedure Laterality Date  . CATARACT EXTRACTION W/ INTRAOCULAR LENS  IMPLANT, BILATERAL  2012  . HEMORRHOID SURGERY    . HERNIA REPAIR    . KNEE ARTHROSCOPY  12/25/2011   Procedure: ARTHROSCOPY KNEE;  Surgeon: Adam Bastos, MD;  Location: [facility];  Service: Orthopedics;  Laterality: Right;  right knee arthroscopy with medial menisectomy      Family History  Problem Relation Age of Onset  . Diabetes Brother   . Heart attack Father     Social history:  reports that he has quit smoking. His smoking use included Cigarettes. He has a 10.00 pack-year smoking history. He has never used smokeless tobacco. He reports that he drinks about 7.0 oz of alcohol per week . He reports that he does not use drugs.  Medications:  Prior to Admission medications   Medication Sig Start Date End Date Taking? Authorizing Provider  Ascorbic Acid (VITAMIN C) 1000 MG tablet Take 500 mg by mouth daily.    Yes Historical Provider, MD  atorvastatin (LIPITOR) 40 MG tablet Take 40 mg by mouth daily. 11/09/15  Yes Historical Provider, MD  clobetasol cream (TEMOVATE) 0.30 % Apply 1 application topically every evening. 05/03/16  Yes Historical Provider, MD  cloNIDine (  CATAPRES) 0.1 MG tablet Take 0.5 tablets by mouth 2 (two) times daily. 10/19/14  Yes Historical Provider, MD  finasteride (PROSCAR) 5 MG tablet  10/11/14  Yes Historical Provider, MD  labetalol (NORMODYNE) 100 MG tablet Take 100 mg by mouth 2 (two) times daily.   Yes Historical Provider, MD  Multiple Vitamins-Minerals (PRESERVISION AREDS PO) Take by mouth 1 day or 1 dose.   Yes Historical Provider, MD  nystatin ointment (MYCOSTATIN) Apply 1 application topically 2 (two) times daily.   Yes Historical Provider, MD  Omega-3 Fatty Acids (FISH OIL) 1000 MG CAPS Take 1,000 mg by mouth 3 (three) times  daily.   Yes Historical Provider, MD  PSYLLIUM HUSK PO Take by mouth.   Yes Historical Provider, MD  sodium bicarbonate 325 MG tablet Take 325 mg by mouth 2 (two) times daily.   Yes Historical Provider, MD  ULORIC 40 MG tablet Take 40 mg by mouth 3 (three) times a week. Adam [name], sun 11/17/15  Yes Historical Provider, MD  amLODipine (NORVASC) 2.5 MG tablet Take 1 tablet (2.5 mg total) by mouth daily. 01/12/16 04/11/16  Fay Records, MD     No Known Allergies  ROS:  Out of a complete 14 system review of symptoms, the patient complains only of the following symptoms, and all other reviewed systems are negative.  Dizziness, gait instability  Blood pressure (!) 144/60, pulse (!) 55, height 5\' 3"  (1.6 m), weight 155 lb (70.3 kg).  Physical Exam  General: The patient is alert and cooperative at the time of the examination.  Eyes: Pupils are equal, round, and reactive to light. Discs are flat bilaterally.  Neck: The neck is supple, no carotid bruits are noted.  Respiratory: The respiratory examination is clear.  Cardiovascular: The cardiovascular examination reveals a regular rate and rhythm, no obvious murmurs or rubs are noted.  Neuromuscular: There is atrophy of the left lower leg as compared to the right.  Skin: Extremities are without significant edema.  Neurologic Exam  Mental status: The patient is alert and oriented x 3 at the time of the examination. The patient has apparent normal recent and remote memory, with an apparently normal attention span and concentration ability.  Cranial nerves: Facial symmetry is present. There is good sensation of the face to pinprick and soft touch bilaterally. The strength of the facial muscles and the muscles to head turning and shoulder shrug are normal bilaterally. Speech is well enunciated, no aphasia or dysarthria is noted. Extraocular movements are full. Visual fields are full. The tongue is midline, and the patient has symmetric elevation  of the soft palate. No obvious hearing deficits are noted.  Motor: The motor testing reveals 5 over 5 strength of all 4 extremities. Good symmetric motor tone is noted throughout.  Sensory: Sensory testing is intact to pinprick, soft touch, vibration sensation, and position sense on the upper extremities. With the lower extremities, there is decreased pinprick sensation on the left leg below the knee, there is a stocking pattern pinprick sensation deficit on the left to the knee. His is not present on the right. Vibration sensation is decreased on the left greater than right foot, position sensation is decreased on the right greater than left foot. No evidence of extinction is noted.  Coordination: Cerebellar testing reveals good finger-nose-finger and heel-to-shin bilaterally.  Gait and station: Gait is slightly wide-based, unsteady. Tandem gait is unsteady. Romberg is negative. No drift is seen.  Reflexes: Deep tendon reflexes are symmetric, but are  depressed absent bilaterally. Toes are downgoing bilaterally.   Assessment/Plan:  1. Gait instability, sudden onset  The patient reports a sudden onset event that is most consistent with a stroke event. The patient will be sent back for MRI of the brain. Clinical examination shows areflexia, the patient has a history of IgM monoclonal antibody and diabetes, the patient may have an underlying peripheral neuropathy that may be contributing to the gait disorder. We may consider EMG and nerve conduction study evaluation in the future. The patient does have impairment of position sensation in both feet, left greater than right. He will follow-up in 4 months.  Jill Alexanders MD 06/21/2016 11:10 AM  [facility] [address] [place], [place] [address]  Phone [phone] Fax [phone]

## 2016-06-23 LAB — SYPHILIS: RPR W/REFLEX TO RPR TITER AND TREPONEMAL ANTIBODIES, TRADITIONAL SCREENING AND DIAGNOSIS ALGORITHM: RPR Ser Ql: NONREACTIVE

## 2016-06-23 LAB — COPPER, SERUM: Copper: 91 ug/dL (ref 72–166)

## 2016-06-23 LAB — VITAMIN B12: Vitamin B-12: 631 pg/mL (ref 232–1245)

## 2016-06-25 ENCOUNTER — Telehealth: Payer: Self-pay | Admitting: *Deleted

## 2016-06-25 NOTE — Telephone Encounter (Signed)
Adam [name] did he say where he wanted to go?

## 2016-06-25 NOTE — Telephone Encounter (Signed)
[facility] stated [facility] [place]. He did not know specific name

## 2016-06-25 NOTE — Telephone Encounter (Signed)
-----   Message from Kathrynn Ducking, MD sent at 06/24/2016  8:33 AM EDT -----  The blood work results are unremarkable. Please call the patient.  ----- Message ----- From: Lavone Neri Lab Results In Sent: 06/22/2016   7:42 AM To: Kathrynn Ducking, MD

## 2016-06-25 NOTE — Telephone Encounter (Signed)
I called [facility] in [place] to see if I have to make the appointment or do they call the patient and scheduled I had to leave a voicemail if I do not hear anything back by tomorrow I will call again.Marland Kitchen

## 2016-06-25 NOTE — Telephone Encounter (Signed)
Called and spoke to pt about lab results per CW,MD note. Pt verbalized understanding.   Advised MRI order sent to [facility]. He prefers to go to [facility] in [place]. Advised I will send message to MRI coordinator to get this switched for him. May have to be approved via insurance again.

## 2016-06-26 NOTE — Telephone Encounter (Signed)
Patient is scheduled at [facility] in [place] for 06/30/16.Marland Kitchen

## 2016-06-28 ENCOUNTER — Emergency Department (HOSPITAL_BASED_OUTPATIENT_CLINIC_OR_DEPARTMENT_OTHER)
Admission: EM | Admit: 2016-06-28 | Discharge: 2016-06-28 | Disposition: A | Discharge status: Home / Self Care | Payer: Medicare Other | Attending: Emergency Medicine | Admitting: Emergency Medicine

## 2016-06-28 ENCOUNTER — Emergency Department (HOSPITAL_BASED_OUTPATIENT_CLINIC_OR_DEPARTMENT_OTHER): Payer: Medicare Other

## 2016-06-28 ENCOUNTER — Encounter (HOSPITAL_BASED_OUTPATIENT_CLINIC_OR_DEPARTMENT_OTHER): Payer: Self-pay | Admitting: *Deleted

## 2016-06-28 DIAGNOSIS — I499 Cardiac arrhythmia, unspecified: Secondary | ICD-10-CM | POA: Diagnosis not present

## 2016-06-28 DIAGNOSIS — E119 Type 2 diabetes mellitus without complications: Secondary | ICD-10-CM | POA: Insufficient documentation

## 2016-06-28 DIAGNOSIS — R001 Bradycardia, unspecified: Secondary | ICD-10-CM | POA: Diagnosis not present

## 2016-06-28 DIAGNOSIS — R531 Weakness: Secondary | ICD-10-CM | POA: Diagnosis not present

## 2016-06-28 DIAGNOSIS — I10 Essential (primary) hypertension: Secondary | ICD-10-CM | POA: Diagnosis not present

## 2016-06-28 DIAGNOSIS — Z87891 Personal history of nicotine dependence: Secondary | ICD-10-CM | POA: Diagnosis not present

## 2016-06-28 DIAGNOSIS — R55 Syncope and collapse: Secondary | ICD-10-CM | POA: Diagnosis not present

## 2016-06-28 DIAGNOSIS — R42 Dizziness and giddiness: Secondary | ICD-10-CM | POA: Diagnosis not present

## 2016-06-28 LAB — COMPREHENSIVE METABOLIC PANEL
ALT: 19 U/L (ref 17–63)
AST: 21 U/L (ref 15–41)
Albumin: 3.8 g/dL (ref 3.5–5.0)
Alkaline Phosphatase: 44 U/L (ref 38–126)
Anion gap: 8 (ref 5–15)
BUN: 33 mg/dL — ABNORMAL HIGH (ref 6–20)
CO2: 27 mmol/L (ref 22–32)
Calcium: 8.6 mg/dL — ABNORMAL LOW (ref 8.9–10.3)
Chloride: 105 mmol/L (ref 101–111)
Creatinine, Ser: 2.02 mg/dL — ABNORMAL HIGH (ref 0.61–1.24)
GFR calc Af Amer: 33 mL/min — ABNORMAL LOW (ref >60)
GFR calc non Af Amer: 29 mL/min — ABNORMAL LOW (ref >60)
Glucose, Bld: 124 mg/dL — ABNORMAL HIGH (ref 65–99)
Potassium: 4.1 mmol/L (ref 3.5–5.1)
Sodium: 140 mmol/L (ref 135–145)
Total Bilirubin: 0.9 mg/dL (ref 0.3–1.2)
Total Protein: 6.2 g/dL — ABNORMAL LOW (ref 6.5–8.1)

## 2016-06-28 LAB — URINALYSIS, ROUTINE W REFLEX MICROSCOPIC
Bilirubin Urine: NEGATIVE
Glucose, UA: NEGATIVE mg/dL
Hgb urine dipstick: NEGATIVE
Ketones, ur: NEGATIVE mg/dL
Leukocytes, UA: NEGATIVE
Nitrite: NEGATIVE
Protein, ur: 30 mg/dL — AB
Specific Gravity, Urine: 1.01 (ref 1.005–1.030)
pH: 7.5 (ref 5.0–8.0)

## 2016-06-28 LAB — CBC WITH DIFFERENTIAL/PLATELET
Basophils Absolute: 0.1 10*3/uL (ref 0.0–0.1)
Basophils Relative: 2 %
Eosinophils Absolute: 0.2 10*3/uL (ref 0.0–0.7)
Eosinophils Relative: 4 %
HCT: 31.6 % — ABNORMAL LOW (ref 39.0–52.0)
Hemoglobin: 11 g/dL — ABNORMAL LOW (ref 13.0–17.0)
Lymphocytes Relative: 25 %
Lymphs Abs: 1.3 10*3/uL (ref 0.7–4.0)
MCH: 33 pg (ref 26.0–34.0)
MCHC: 34.8 g/dL (ref 30.0–36.0)
MCV: 94.9 fL (ref 78.0–100.0)
Monocytes Absolute: 0.8 10*3/uL (ref 0.1–1.0)
Monocytes Relative: 15 %
Neutro Abs: 2.8 10*3/uL (ref 1.7–7.7)
Neutrophils Relative %: 54 %
Platelets: 166 10*3/uL (ref 150–400)
RBC: 3.33 MIL/uL — ABNORMAL LOW (ref 4.22–5.81)
RDW: 13.2 % (ref 11.5–15.5)
WBC: 5.2 10*3/uL (ref 4.0–10.5)

## 2016-06-28 LAB — URINALYSIS, MICROSCOPIC (REFLEX): WBC, UA: NONE SEEN WBC/hpf (ref 0–5)

## 2016-06-28 LAB — TROPONIN I
Troponin I: <0.03 ng/mL (ref <0.03)
Troponin I: <0.03 ng/mL (ref <0.03)

## 2016-06-28 MED ORDER — SODIUM CHLORIDE 0.9 % IV SOLN: INTRAVENOUS | Status: DC

## 2016-06-28 MED ORDER — SODIUM CHLORIDE 0.9 % IV BOLUS (SEPSIS)
1000.0000 mL | Freq: Once | INTRAVENOUS | Status: AC
  Administered 2016-06-28: 1000 mL via INTRAVENOUS

## 2016-06-28 NOTE — ED Provider Notes (Signed)
[facility] DEPT [facility] Provider Note   CSN: [id number] Arrival date & time: 06/28/16  1038     History   Chief Complaint Chief Complaint  Patient presents with  . Near Syncope    HPI Adam [name] is a 83 y.o. male.  Pt presents to the ED today with a near syncopal event.  The pt said that he was participating in an exercise class at his assisted living facility.  He said he got sweaty and nauseous and pale.  He had to sit down.  EMS was called to facility.  When they arrived, his bp was in the low 100s.  They gave him 250 cc of fluid and his bp is now 178.  He said he feels normal now.  He saw Dr. Jannifer Franklin (neurology) for a new gait disorder and is scheduled for an MRI here on 3/31.        Past Medical History:  Diagnosis Date  . Acute meniscal tear of knee RIGHT KNEE  . Arthritis   . BPH (benign prostatic hypertrophy)   . Colon polyps    hyperplastic  . Diabetes mellitus type 2, diet-controlled (HCC) AND EXERCISE-- LAST A1C  5.6  . Diverticulosis   . GERD (gastroesophageal reflux disease)   . H/O hiatal hernia   . History of esophageal stricture S/P DILATION IN 2007  . Hypertension   . IgM monoclonal gammopathy of uncertain significance ASYMPTOMATIC    FOLLOWED BY DR Alen Blew  . Internal hemorrhoids   . Macrocytic anemia DUE TO CHRONIC RENAL INSUFF.  . Nocturia   . Right knee pain     Patient Active Problem List   Diagnosis Date Noted  . Dizziness and giddiness 06/21/2016  . Meniscus, lateral, anterior horn derangement 12/25/2011  . Meniscus, medial, bucket handle tear, old 12/25/2011  . Osteoarthritis of right knee 12/25/2011    Past Surgical History:  Procedure Laterality Date  . CATARACT EXTRACTION W/ INTRAOCULAR LENS  IMPLANT, BILATERAL  2012  . HEMORRHOID SURGERY    . HERNIA REPAIR    . KNEE ARTHROSCOPY  12/25/2011   Procedure: ARTHROSCOPY KNEE;  Surgeon: Tobi Bastos, MD;  Location: [facility];  Service: Orthopedics;  Laterality:  Right;  right knee arthroscopy with medial menisectomy         Home Medications    Prior to Admission medications   Medication Sig Start Date End Date Taking? Authorizing Provider  amLODipine (NORVASC) 2.5 MG tablet Take 1 tablet (2.5 mg total) by mouth daily. 01/12/16 04/11/16  Fay Records, MD  Ascorbic Acid (VITAMIN C) 1000 MG tablet Take 500 mg by mouth daily.     Historical Provider, MD  atorvastatin (LIPITOR) 40 MG tablet Take 40 mg by mouth daily. 11/09/15   Historical Provider, MD  clobetasol cream (TEMOVATE) 2.09 % Apply 1 application topically every evening. 05/03/16   Historical Provider, MD  cloNIDine (CATAPRES) 0.1 MG tablet Take 0.5 tablets by mouth 2 (two) times daily. 10/19/14   Historical Provider, MD  finasteride (PROSCAR) 5 MG tablet  10/11/14   Historical Provider, MD  labetalol (NORMODYNE) 100 MG tablet Take 100 mg by mouth 2 (two) times daily.    Historical Provider, MD  Multiple Vitamins-Minerals (PRESERVISION AREDS PO) Take by mouth 1 day or 1 dose.    Historical Provider, MD  nystatin ointment (MYCOSTATIN) Apply 1 application topically 2 (two) times daily.    Historical Provider, MD  Omega-3 Fatty Acids (FISH OIL) 1000 MG CAPS Take 1,000 mg  by mouth 3 (three) times daily.    Historical Provider, MD  PSYLLIUM HUSK PO Take by mouth.    Historical Provider, MD  sodium bicarbonate 325 MG tablet Take 325 mg by mouth 2 (two) times daily.    Historical Provider, MD  ULORIC 40 MG tablet Take 40 mg by mouth 3 (three) times a week. Theora Master, sun 11/17/15   Historical Provider, MD    Family History Family History  Problem Relation Age of Onset  . Diabetes Brother   . Heart attack Father     Social History Social History  Substance Use Topics  . Smoking status: Former Smoker    Packs/day: 1.00    Years: 10.00    Types: Cigarettes  . Smokeless tobacco: Never Used  . Alcohol use 7.0 oz/week    14 Standard drinks or equivalent per week     Allergies   Patient has no  known allergies.   Review of Systems Review of Systems  Neurological: Positive for syncope and light-headedness.  All other systems reviewed and are negative.    Physical Exam Updated Vital Signs BP (!) 168/71   Pulse 70   Temp 97.8 F (36.6 C) (Oral)   Resp 18   SpO2 99%   Physical Exam  Constitutional: He is oriented to person, place, and time. He appears well-developed and well-nourished.  HENT:  Head: Normocephalic and atraumatic.  Right Ear: External ear normal.  Left Ear: External ear normal.  Nose: Nose normal.  Mouth/Throat: Oropharynx is clear and moist.  Eyes: Conjunctivae and EOM are normal. Pupils are equal, round, and reactive to light.  Neck: Normal range of motion. Neck supple.  Cardiovascular: Normal rate, regular rhythm, normal heart sounds and intact distal pulses.   Pulmonary/Chest: Effort normal and breath sounds normal.  Abdominal: Soft. Bowel sounds are normal.  Musculoskeletal: Normal range of motion.  Neurological: He is alert and oriented to person, place, and time.  Skin: Skin is warm.  Psychiatric: He has a normal mood and affect. His behavior is normal. Judgment and thought content normal.  Nursing note and vitals reviewed.    ED Treatments / Results  Labs (all labs ordered are listed, but only abnormal results are displayed) Labs Reviewed  CBC WITH DIFFERENTIAL/PLATELET - Abnormal; Notable for the following:       Result Value   RBC 3.33 (*)    Hemoglobin 11.0 (*)    HCT 31.6 (*)    All other components within normal limits  COMPREHENSIVE METABOLIC PANEL - Abnormal; Notable for the following:    Glucose, Bld 124 (*)    BUN 33 (*)    Creatinine, Ser 2.02 (*)    Calcium 8.6 (*)    Total Protein 6.2 (*)    GFR calc non Af Amer 29 (*)    GFR calc Af Amer 33 (*)    All other components within normal limits  URINALYSIS, ROUTINE W REFLEX MICROSCOPIC - Abnormal; Notable for the following:    Protein, ur 30 (*)    All other components  within normal limits  URINALYSIS, MICROSCOPIC (REFLEX) - Abnormal; Notable for the following:    Bacteria, UA RARE (*)    Squamous Epithelial / LPF 0-5 (*)    All other components within normal limits  TROPONIN I  TROPONIN I    EKG  EKG Interpretation  Date/Time:  Thursday June 28 2016 10:42:39 EDT Ventricular Rate:  60 PR Interval:    QRS Duration: 109 QT Interval:  483 QTC Calculation: 483 R Axis:   83 Text Interpretation:  Sinus rhythm Atrial premature complex Borderline right axis deviation Abnormal R-wave progression, early transition Borderline prolonged QT interval No significant change was found Confirmed by [facility] MD, Lunna Vogelgesang ([id number]) on 06/28/2016 10:54:33 AM       Radiology Dg Chest 2 View  Result Date: 06/28/2016 CLINICAL DATA:  Dizziness and lightheadedness today while working out. No syncope. Former smoker. EXAM: CHEST  2 VIEW COMPARISON:  None in PACs FINDINGS: The lungs are adequately inflated. The interstitial markings are coarse. There is no focal infiltrate. There is no pleural effusion. The heart and pulmonary vascularity are normal. The mediastinum is normal in width. There is mild loss of height of a mid thoracic vertebral body. This is unchanged since a metastatic bone survey of May 14, 2013. IMPRESSION: There is no acute cardiopulmonary abnormality. Mild prominence of the interstitial markings of both lungs likely reflects the patient's smoking history. Electronically Signed   By: David  Martinique M.D.   On: 06/28/2016 11:19   Ct Head Wo Contrast  Result Date: 06/28/2016 CLINICAL DATA:  Pt was exercising this morning and began to feel weak and nauseous, near syncope EXAM: CT HEAD WITHOUT CONTRAST TECHNIQUE: Contiguous axial images were obtained from the base of the skull through the vertex without intravenous contrast. COMPARISON:  Head CT 05/08/2016 FINDINGS: Brain: No acute intracranial hemorrhage. No focal mass lesion. No CT evidence of acute infarction. No  midline shift or mass effect. No hydrocephalus. Basilar cisterns are patent. Generalized cortical atrophy and mildly disproportional ventricular dilatation. Mild periventricular and subcortical white matter hypodensities. Vascular: No hyperdense vessel or unexpected calcification. Skull: Normal. Negative for fracture or focal lesion. Sinuses/Orbits: Paranasal sinuses and mastoid air cells are clear. Orbits are clear. Other: None. IMPRESSION: 1. No acute intracranial findings. 2. Atrophy and ventriculomegaly unchanged. Electronically Signed   By: Suzy Bouchard M.D.   On: 06/28/2016 11:25    Procedures Procedures (including critical care time)  Medications Ordered in ED Medications  sodium chloride 0.9 % bolus 1,000 mL (0 mLs Intravenous Stopped 06/28/16 1212)    And  0.9 %  sodium chloride infusion (not administered)     Initial Impression / Assessment and Plan / ED Course  I have reviewed the triage vital signs and the nursing notes.  Pertinent labs & imaging results that were available during my care of the patient were reviewed by me and considered in my medical decision making (see chart for details).    Pt is feeling better after IVFs.  He knows to return for worsening of sx.  He knows to f/u with his pcp.  He is encouraged to make sure he is well hydrated before he exercises.  Final Clinical Impressions(s) / ED Diagnoses   Final diagnoses:  Near syncope    New Prescriptions New Prescriptions   No medications on file     Isla Pence, MD 06/28/16 1413

## 2016-06-28 NOTE — ED Triage Notes (Signed)
Per EMS report: pt had near syncopal episode during exercise class. Pt initially hypotensive and HR 50s. 18g piv in place in right hand with 500cc NS infusing.  CAO x 4 at arrival to ED

## 2016-06-28 NOTE — ED Notes (Signed)
Pt ambulatory at bedside without need for assist. D/c home with family. Report called to Hilda Blades, RN at [facility] with permission of pt

## 2016-06-28 NOTE — ED Notes (Signed)
Patient transported to X-ray 

## 2016-06-30 ENCOUNTER — Ambulatory Visit (HOSPITAL_BASED_OUTPATIENT_CLINIC_OR_DEPARTMENT_OTHER)
Admission: RE | Admit: 2016-06-30 | Discharge: 2016-06-30 | Disposition: A | Discharge status: Home / Self Care | Payer: Medicare Other | Source: Ambulatory Visit | Attending: Neurology | Admitting: Neurology

## 2016-06-30 DIAGNOSIS — G9389 Other specified disorders of brain: Secondary | ICD-10-CM | POA: Diagnosis not present

## 2016-06-30 DIAGNOSIS — R269 Unspecified abnormalities of gait and mobility: Secondary | ICD-10-CM | POA: Insufficient documentation

## 2016-07-01 ENCOUNTER — Telehealth: Payer: Self-pay | Admitting: Neurology

## 2016-07-01 DIAGNOSIS — R269 Unspecified abnormalities of gait and mobility: Secondary | ICD-10-CM

## 2016-07-01 NOTE — Telephone Encounter (Signed)
I called the patient. The MRI of the brain show ventriculomegally, but there is evidence of global cortical atrophy, likely hydrocephalus ex-vacuo.  Cause of the sudden onset gait disturbance is not clear. The patient may have an underlying PN, will get EMG and NCV set up.   MRI brain 06/29/16:  IMPRESSION: Moderate ventricular enlargement. This is stable over prior studies. This could be due to normal pressure hydrocephalus versus central atrophy  Negative for acute infarct. Mild chronic microvascular ischemic change in the white matter.

## 2016-07-11 ENCOUNTER — Ambulatory Visit: Payer: TRICARE For Life (TFL) | Admitting: Neurology

## 2016-07-11 DIAGNOSIS — H353221 Exudative age-related macular degeneration, left eye, with active choroidal neovascularization: Secondary | ICD-10-CM | POA: Diagnosis not present

## 2016-07-30 ENCOUNTER — Encounter: Payer: Self-pay | Admitting: Neurology

## 2016-07-30 ENCOUNTER — Ambulatory Visit (INDEPENDENT_AMBULATORY_CARE_PROVIDER_SITE_OTHER): Payer: Self-pay | Admitting: Neurology

## 2016-07-30 ENCOUNTER — Ambulatory Visit (INDEPENDENT_AMBULATORY_CARE_PROVIDER_SITE_OTHER): Payer: Medicare Other | Admitting: Neurology

## 2016-07-30 DIAGNOSIS — R2689 Other abnormalities of gait and mobility: Secondary | ICD-10-CM | POA: Diagnosis not present

## 2016-07-30 DIAGNOSIS — R269 Unspecified abnormalities of gait and mobility: Secondary | ICD-10-CM

## 2016-07-30 DIAGNOSIS — R42 Dizziness and giddiness: Secondary | ICD-10-CM

## 2016-07-30 NOTE — Procedures (Signed)
     HISTORY:  Adam [name] is an 83 year old gentleman with onset of a gait disturbance within the last 6 months. The patient has a history of a monoclonal antibody, he is being evaluated for a possible peripheral neuropathy as a source of the gait disturbance.  NERVE CONDUCTION STUDIES:  Nerve conduction studies were performed on both lower extremities. The distal motor latencies for the peroneal nerves were within normal limits bilaterally with normal motor amplitudes for these nerves. The distal motor latencies for the posterior tibial nerves were normal on the left, prolonged on the right, with a low motor amplitude on the left, normal on the right. The nerve conduction velocities for the peroneal and posterior tibial nerves were normal bilaterally with exception of slight slowing for the left posterior tibial nerve. The peroneal sensory latencies were within normal limits bilaterally. The H reflex latencies were unobtainable bilaterally.  EMG STUDIES:  EMG study was performed on the left lower extremity:  The tibialis anterior muscle reveals 2 to 4K motor units with decreased recruitment. No fibrillations or positive waves were seen. The peroneus tertius muscle reveals 2 to 4K motor units with decreased recruitment. No fibrillations or positive waves were seen. The medial gastrocnemius muscle reveals 1 to 3K motor units with full recruitment. No fibrillations or positive waves were seen. The vastus lateralis muscle reveals 2 to 4K motor units with full recruitment. No fibrillations or positive waves were seen. The iliopsoas muscle reveals 2 to 4K motor units with full recruitment. No fibrillations or positive waves were seen. The biceps femoris muscle (long head) reveals 2 to 4K motor units with full recruitment. No fibrillations or positive waves were seen. The lumbosacral paraspinal muscles were tested at 3 levels, and revealed no abnormalities of insertional activity at all 3 levels  tested. There was good relaxation.   IMPRESSION:  Nerve conduction studies done on both lower extremities do not show clear evidence of a peripheral neuropathy. Appears to be some distal dysfunction of the report right posterior tibial nerve. EMG evaluation of the left lower extremity shows chronic stable signs of denervation in the peroneal nerve distribution, otherwise there is no clear evidence of an overlying lumbosacral radiculopathy.  Jill Alexanders MD 07/30/2016 1:52 PM  [facility] [address] [place], [place] [address]  Phone [phone] Fax [phone]

## 2016-07-30 NOTE — Progress Notes (Signed)
The EMG nerve conduction study does not show an etiology of the gait disturbance.  MRI does show ventriculomegally, I believe that this is related to diffuse cortical atrophy, but the radiologist read out as possible normal pressure hydrocephalus.  The patient has a timed walk test with 6 laps of 20 feet able to do this in 25 seconds.  Will follow-up in 4 months, we will follow the timed walk test, consider lumbar puncture if the walking worsens.

## 2016-07-30 NOTE — Progress Notes (Signed)
Please refer to EMG and nerve conduction study procedure note. 

## 2016-08-16 DIAGNOSIS — H353211 Exudative age-related macular degeneration, right eye, with active choroidal neovascularization: Secondary | ICD-10-CM | POA: Diagnosis not present

## 2016-08-16 DIAGNOSIS — H43821 Vitreomacular adhesion, right eye: Secondary | ICD-10-CM | POA: Diagnosis not present

## 2016-08-16 DIAGNOSIS — H353221 Exudative age-related macular degeneration, left eye, with active choroidal neovascularization: Secondary | ICD-10-CM | POA: Diagnosis not present

## 2016-08-16 DIAGNOSIS — H35371 Puckering of macula, right eye: Secondary | ICD-10-CM | POA: Diagnosis not present

## 2016-08-16 DIAGNOSIS — E113211 Type 2 diabetes mellitus with mild nonproliferative diabetic retinopathy with macular edema, right eye: Secondary | ICD-10-CM | POA: Diagnosis not present

## 2016-08-16 DIAGNOSIS — H353131 Nonexudative age-related macular degeneration, bilateral, early dry stage: Secondary | ICD-10-CM | POA: Diagnosis not present

## 2016-08-22 ENCOUNTER — Encounter: Payer: Self-pay | Admitting: Neurology

## 2016-08-30 DIAGNOSIS — H353131 Nonexudative age-related macular degeneration, bilateral, early dry stage: Secondary | ICD-10-CM | POA: Diagnosis not present

## 2016-08-30 DIAGNOSIS — H353211 Exudative age-related macular degeneration, right eye, with active choroidal neovascularization: Secondary | ICD-10-CM | POA: Diagnosis not present

## 2016-08-30 DIAGNOSIS — H35372 Puckering of macula, left eye: Secondary | ICD-10-CM | POA: Diagnosis not present

## 2016-08-30 DIAGNOSIS — H353221 Exudative age-related macular degeneration, left eye, with active choroidal neovascularization: Secondary | ICD-10-CM | POA: Diagnosis not present

## 2016-09-27 DIAGNOSIS — H353131 Nonexudative age-related macular degeneration, bilateral, early dry stage: Secondary | ICD-10-CM | POA: Diagnosis not present

## 2016-09-27 DIAGNOSIS — H353212 Exudative age-related macular degeneration, right eye, with inactive choroidal neovascularization: Secondary | ICD-10-CM | POA: Diagnosis not present

## 2016-09-27 DIAGNOSIS — H353221 Exudative age-related macular degeneration, left eye, with active choroidal neovascularization: Secondary | ICD-10-CM | POA: Diagnosis not present

## 2016-09-27 DIAGNOSIS — H35371 Puckering of macula, right eye: Secondary | ICD-10-CM | POA: Diagnosis not present

## 2016-09-27 DIAGNOSIS — H43821 Vitreomacular adhesion, right eye: Secondary | ICD-10-CM | POA: Diagnosis not present

## 2016-10-10 ENCOUNTER — Telehealth: Payer: Self-pay | Admitting: Neurology

## 2016-10-10 DIAGNOSIS — R269 Unspecified abnormalities of gait and mobility: Secondary | ICD-10-CM

## 2016-10-10 NOTE — Addendum Note (Signed)
Addended by: Kathrynn Ducking on: 10/10/2016 04:40 PM   Modules accepted: Orders

## 2016-10-10 NOTE — Telephone Encounter (Signed)
Called and spoke with patient. Advised him to call our office back to let me know once he schedules spinal tap so we can add him to Dr Jannifer Franklin' schedule for the day after spinal tap for him to evaluate his walking. He verbalized understanding.

## 2016-10-10 NOTE — Telephone Encounter (Signed)
Patient called the office in reference to wanting to set up a spinal tap.  Please call

## 2016-10-11 NOTE — Telephone Encounter (Signed)
[facility] could not do LP. Rance Muir sent order to [facility] imaging instead.

## 2016-10-16 NOTE — Telephone Encounter (Signed)
Order has been sent to [facility] [phone] . Because [facility] does not perform LP's . I called Patient and relayed to him.  Order faxed to [facility] - telephone [phone]. Order faxed 10/10/2016.

## 2016-10-16 NOTE — Telephone Encounter (Signed)
Hinton Dyer- can you check to make sure they received his order? I do not see anything in EPIC to where they have tried to schedule. Thank you!

## 2016-10-17 NOTE — Telephone Encounter (Signed)
Dr Jannifer Franklin- can pt cx appt on 10/31/16?

## 2016-10-17 NOTE — Telephone Encounter (Signed)
Called and spoke with pt. Verified he is scheduled on 11/07/16 for LP. Scheduled f/u per CW,MD for next day at end of day.   Pt wants to cx appt on 10/31/16. Advised I will check with CW,MD to make sure ok first and will cx if ok. If not, I will call and let him know. He verbalized understanding.

## 2016-10-18 DIAGNOSIS — H353113 Nonexudative age-related macular degeneration, right eye, advanced atrophic without subfoveal involvement: Secondary | ICD-10-CM | POA: Diagnosis not present

## 2016-10-18 DIAGNOSIS — H353221 Exudative age-related macular degeneration, left eye, with active choroidal neovascularization: Secondary | ICD-10-CM | POA: Diagnosis not present

## 2016-10-18 DIAGNOSIS — H43821 Vitreomacular adhesion, right eye: Secondary | ICD-10-CM | POA: Diagnosis not present

## 2016-10-18 DIAGNOSIS — H353211 Exudative age-related macular degeneration, right eye, with active choroidal neovascularization: Secondary | ICD-10-CM | POA: Diagnosis not present

## 2016-10-18 NOTE — Telephone Encounter (Signed)
Cancelled appt on 10/31/16.

## 2016-10-24 ENCOUNTER — Other Ambulatory Visit: Payer: Self-pay | Admitting: Internal Medicine

## 2016-10-26 DIAGNOSIS — E784 Other hyperlipidemia: Secondary | ICD-10-CM | POA: Diagnosis not present

## 2016-10-26 DIAGNOSIS — I10 Essential (primary) hypertension: Secondary | ICD-10-CM | POA: Diagnosis not present

## 2016-10-26 DIAGNOSIS — Z125 Encounter for screening for malignant neoplasm of prostate: Secondary | ICD-10-CM | POA: Diagnosis not present

## 2016-10-26 DIAGNOSIS — E1129 Type 2 diabetes mellitus with other diabetic kidney complication: Secondary | ICD-10-CM | POA: Diagnosis not present

## 2016-10-26 DIAGNOSIS — M109 Gout, unspecified: Secondary | ICD-10-CM | POA: Diagnosis not present

## 2016-10-31 ENCOUNTER — Ambulatory Visit: Payer: Medicare Other | Admitting: Neurology

## 2016-11-02 DIAGNOSIS — C439 Malignant melanoma of skin, unspecified: Secondary | ICD-10-CM | POA: Diagnosis not present

## 2016-11-02 DIAGNOSIS — Z1389 Encounter for screening for other disorder: Secondary | ICD-10-CM | POA: Diagnosis not present

## 2016-11-02 DIAGNOSIS — Z0000 Encounter for general adult medical examination without abnormal findings: Secondary | ICD-10-CM | POA: Diagnosis not present

## 2016-11-02 DIAGNOSIS — I129 Hypertensive chronic kidney disease with stage 1 through stage 4 chronic kidney disease, or unspecified chronic kidney disease: Secondary | ICD-10-CM | POA: Diagnosis not present

## 2016-11-02 DIAGNOSIS — R2689 Other abnormalities of gait and mobility: Secondary | ICD-10-CM | POA: Diagnosis not present

## 2016-11-02 DIAGNOSIS — N183 Chronic kidney disease, stage 3 (moderate): Secondary | ICD-10-CM | POA: Diagnosis not present

## 2016-11-02 DIAGNOSIS — R42 Dizziness and giddiness: Secondary | ICD-10-CM | POA: Diagnosis not present

## 2016-11-02 DIAGNOSIS — Z6826 Body mass index (BMI) 26.0-26.9, adult: Secondary | ICD-10-CM | POA: Diagnosis not present

## 2016-11-02 DIAGNOSIS — I517 Cardiomegaly: Secondary | ICD-10-CM | POA: Diagnosis not present

## 2016-11-02 DIAGNOSIS — H3530 Unspecified macular degeneration: Secondary | ICD-10-CM | POA: Diagnosis not present

## 2016-11-02 DIAGNOSIS — D472 Monoclonal gammopathy: Secondary | ICD-10-CM | POA: Diagnosis not present

## 2016-11-02 DIAGNOSIS — E1129 Type 2 diabetes mellitus with other diabetic kidney complication: Secondary | ICD-10-CM | POA: Diagnosis not present

## 2016-11-06 NOTE — Discharge Instructions (Signed)

## 2016-11-07 ENCOUNTER — Telehealth: Payer: Self-pay | Admitting: Neurology

## 2016-11-07 ENCOUNTER — Ambulatory Visit
Admission: RE | Admit: 2016-11-07 | Discharge: 2016-11-07 | Disposition: A | Discharge status: Home / Self Care | Payer: Medicare Other | Source: Ambulatory Visit | Attending: Neurology | Admitting: Neurology

## 2016-11-07 VITALS — BP 177/74 | HR 58

## 2016-11-07 DIAGNOSIS — R42 Dizziness and giddiness: Secondary | ICD-10-CM | POA: Diagnosis not present

## 2016-11-07 DIAGNOSIS — R269 Unspecified abnormalities of gait and mobility: Secondary | ICD-10-CM

## 2016-11-07 LAB — CSF CELL COUNT WITH DIFFERENTIAL
RBC Count, CSF: 1 cells/uL (ref 0–10)
WBC, CSF: 0 cells/uL (ref 0–5)

## 2016-11-07 LAB — PROTEIN, CSF: Total Protein, CSF: 74 mg/dL — ABNORMAL HIGH (ref 15–60)

## 2016-11-07 LAB — GLUCOSE, CSF: Glucose, CSF: 61 mg/dL (ref 43–76)

## 2016-11-07 NOTE — Telephone Encounter (Signed)
Patients CSF labels are completed and ready to review in Epic.  FYI

## 2016-11-08 ENCOUNTER — Ambulatory Visit (INDEPENDENT_AMBULATORY_CARE_PROVIDER_SITE_OTHER): Payer: Medicare Other | Admitting: Neurology

## 2016-11-08 ENCOUNTER — Encounter: Payer: Self-pay | Admitting: Neurology

## 2016-11-08 DIAGNOSIS — R269 Unspecified abnormalities of gait and mobility: Secondary | ICD-10-CM | POA: Diagnosis not present

## 2016-11-08 HISTORY — DX: Unspecified abnormalities of gait and mobility: R26.9

## 2016-11-08 NOTE — Progress Notes (Signed)
Reason for visit: Gait disorder  Adam [name] is an 83 y.o. male  History of present illness:  Adam [name] is an 83 year old right-handed white male with a history of a gait disorder that began suddenly on 05/05/2016. The patient has improved slightly since the onset, but he is not back to baseline. MRI of the brain did not identify a stroke event, but did show some cortical atrophy with ventriculomegaly. The possibility of normal pressure hydrocephalus was entertained. The patient was checked for a peripheral neuropathy which was not present. He has undergone lumbar puncture yesterday, 35 mL of spinal fluid was removed. He returns today for an evaluation of his gait.  Past Medical History:  Diagnosis Date  . Acute meniscal tear of knee RIGHT KNEE  . Arthritis   . BPH (benign prostatic hypertrophy)   . Colon polyps    hyperplastic  . Diabetes mellitus type 2, diet-controlled (HCC) AND EXERCISE-- LAST A1C  5.6  . Diverticulosis   . GERD (gastroesophageal reflux disease)   . H/O hiatal hernia   . History of esophageal stricture S/P DILATION IN 2007  . Hypertension   . IgM monoclonal gammopathy of uncertain significance ASYMPTOMATIC    FOLLOWED BY DR Alen Blew  . Internal hemorrhoids   . Macrocytic anemia DUE TO CHRONIC RENAL INSUFF.  . Nocturia   . Right knee pain     Past Surgical History:  Procedure Laterality Date  . CATARACT EXTRACTION W/ INTRAOCULAR LENS  IMPLANT, BILATERAL  2012  . HEMORRHOID SURGERY    . HERNIA REPAIR    . KNEE ARTHROSCOPY  12/25/2011   Procedure: ARTHROSCOPY KNEE;  Surgeon: Tobi Bastos, MD;  Location: [facility];  Service: Orthopedics;  Laterality: Right;  right knee arthroscopy with medial menisectomy      Family History  Problem Relation Age of Onset  . Diabetes Brother   . Heart attack Father     Social history:  reports that he has quit smoking. His smoking use included Cigarettes. He has a 10.00 pack-year smoking history.  He has never used smokeless tobacco. He reports that he drinks about 7.0 oz of alcohol per week . He reports that he does not use drugs.   No Known Allergies  Medications:  Prior to Admission medications   Medication Sig Start Date End Date Taking? Authorizing Provider  amLODipine (NORVASC) 2.5 MG tablet TAKE 1 TABLET DAILY 10/24/16  Yes Fay Records, MD  Ascorbic Acid (VITAMIN C) 1000 MG tablet Take 500 mg by mouth daily.    Yes [provider]  atorvastatin (LIPITOR) 40 MG tablet Take 40 mg by mouth daily. 11/09/15  Yes [provider]  clobetasol cream (TEMOVATE) 7.61 % Apply 1 application topically every evening. 05/03/16  Yes [provider]  cloNIDine (CATAPRES) 0.1 MG tablet Take 0.5 tablets by mouth 2 (two) times daily. 10/19/14  Yes [provider]  finasteride (PROSCAR) 5 MG tablet  10/11/14  Yes [provider]  labetalol (NORMODYNE) 100 MG tablet Take 100 mg by mouth 2 (two) times daily.   Yes [provider]  Multiple Vitamins-Minerals (PRESERVISION AREDS PO) Take by mouth 1 day or 1 dose.   Yes [provider]  nystatin ointment (MYCOSTATIN) Apply 1 application topically 2 (two) times daily.   Yes [provider]  sodium bicarbonate 325 MG tablet Take 325 mg by mouth 2 (two) times daily.   Yes [provider]  ULORIC 40 MG tablet Take 40  mg by mouth 3 (three) times a week. Theora Master, sun 11/17/15  Yes [provider]    ROS:  Out of a complete 14 system review of symptoms, the patient complains only of the following symptoms, and all other reviewed systems are negative.  Gait disturbance  Blood pressure (!) 152/73, pulse (!) 57, height 5\' 3"  (1.6 m), weight 154 lb (69.9 kg).  Physical Exam  General: The patient is alert and cooperative at the time of the examination.  Skin: No significant peripheral edema is noted.   Neurologic Exam  Mental status: The patient is alert and oriented x 3  at the time of the examination. The patient has apparent normal recent and remote memory, with an apparently normal attention span and concentration ability.   Cranial nerves: Facial symmetry is present. Speech is normal, no aphasia or dysarthria is noted. Extraocular movements are full. Visual fields are full.  Motor: The patient has good strength in all 4 extremities.  Gait and station: The patient has a slightly wide-based gait. The patient is able to perform tandem gait, minimal instability is noted. Romberg is negative, no drift is seen.  A timed walk test of 6 laps of 20 feet was done, the patient was able to perform this in 25 seconds.  Reflexes: Deep tendon reflexes are symmetric.   Assessment/Plan:  1. Gait disturbance  The patient believes that there has been good stability with his ability to ambulate. He was able to ambulate 6 laps of 20 feet in 25 seconds today, he also did the same thing prior to the spinal tap in April 2018. The lumbar puncture did not result in any benefit with walking. We will follow the gait disorder over time, he will check back in about 6 months. He will call if he believes that there has been a decline in balance.  Jill Alexanders MD 11/08/2016 4:48 PM  [facility] [address] [place],  [address]  Phone [phone] Fax [phone]

## 2016-11-14 DIAGNOSIS — H353221 Exudative age-related macular degeneration, left eye, with active choroidal neovascularization: Secondary | ICD-10-CM | POA: Diagnosis not present

## 2016-11-14 DIAGNOSIS — H353211 Exudative age-related macular degeneration, right eye, with active choroidal neovascularization: Secondary | ICD-10-CM | POA: Diagnosis not present

## 2016-11-14 DIAGNOSIS — H353113 Nonexudative age-related macular degeneration, right eye, advanced atrophic without subfoveal involvement: Secondary | ICD-10-CM | POA: Diagnosis not present

## 2016-11-14 DIAGNOSIS — H43821 Vitreomacular adhesion, right eye: Secondary | ICD-10-CM | POA: Diagnosis not present

## 2016-11-16 DIAGNOSIS — H353221 Exudative age-related macular degeneration, left eye, with active choroidal neovascularization: Secondary | ICD-10-CM | POA: Diagnosis not present

## 2016-11-26 DIAGNOSIS — H353211 Exudative age-related macular degeneration, right eye, with active choroidal neovascularization: Secondary | ICD-10-CM | POA: Diagnosis not present

## 2016-12-07 DIAGNOSIS — C4442 Squamous cell carcinoma of skin of scalp and neck: Secondary | ICD-10-CM | POA: Diagnosis not present

## 2016-12-07 DIAGNOSIS — Z85828 Personal history of other malignant neoplasm of skin: Secondary | ICD-10-CM | POA: Diagnosis not present

## 2016-12-07 DIAGNOSIS — L570 Actinic keratosis: Secondary | ICD-10-CM | POA: Diagnosis not present

## 2016-12-07 DIAGNOSIS — D485 Neoplasm of uncertain behavior of skin: Secondary | ICD-10-CM | POA: Diagnosis not present

## 2016-12-24 DIAGNOSIS — H35371 Puckering of macula, right eye: Secondary | ICD-10-CM | POA: Diagnosis not present

## 2016-12-24 DIAGNOSIS — H353221 Exudative age-related macular degeneration, left eye, with active choroidal neovascularization: Secondary | ICD-10-CM | POA: Diagnosis not present

## 2016-12-24 DIAGNOSIS — H353211 Exudative age-related macular degeneration, right eye, with active choroidal neovascularization: Secondary | ICD-10-CM | POA: Diagnosis not present

## 2016-12-24 DIAGNOSIS — H43821 Vitreomacular adhesion, right eye: Secondary | ICD-10-CM | POA: Diagnosis not present

## 2016-12-24 DIAGNOSIS — H353113 Nonexudative age-related macular degeneration, right eye, advanced atrophic without subfoveal involvement: Secondary | ICD-10-CM | POA: Diagnosis not present

## 2017-01-02 DIAGNOSIS — H353211 Exudative age-related macular degeneration, right eye, with active choroidal neovascularization: Secondary | ICD-10-CM | POA: Diagnosis not present

## 2017-01-31 DIAGNOSIS — H353211 Exudative age-related macular degeneration, right eye, with active choroidal neovascularization: Secondary | ICD-10-CM | POA: Diagnosis not present

## 2017-01-31 DIAGNOSIS — H353113 Nonexudative age-related macular degeneration, right eye, advanced atrophic without subfoveal involvement: Secondary | ICD-10-CM | POA: Diagnosis not present

## 2017-01-31 DIAGNOSIS — H43821 Vitreomacular adhesion, right eye: Secondary | ICD-10-CM | POA: Diagnosis not present

## 2017-01-31 DIAGNOSIS — H353221 Exudative age-related macular degeneration, left eye, with active choroidal neovascularization: Secondary | ICD-10-CM | POA: Diagnosis not present

## 2017-01-31 DIAGNOSIS — H35371 Puckering of macula, right eye: Secondary | ICD-10-CM | POA: Diagnosis not present

## 2017-02-06 DIAGNOSIS — H353221 Exudative age-related macular degeneration, left eye, with active choroidal neovascularization: Secondary | ICD-10-CM | POA: Diagnosis not present

## 2017-02-06 DIAGNOSIS — H353113 Nonexudative age-related macular degeneration, right eye, advanced atrophic without subfoveal involvement: Secondary | ICD-10-CM | POA: Diagnosis not present

## 2017-02-06 DIAGNOSIS — H43821 Vitreomacular adhesion, right eye: Secondary | ICD-10-CM | POA: Diagnosis not present

## 2017-02-06 DIAGNOSIS — H353211 Exudative age-related macular degeneration, right eye, with active choroidal neovascularization: Secondary | ICD-10-CM | POA: Diagnosis not present

## 2017-03-18 DIAGNOSIS — H43821 Vitreomacular adhesion, right eye: Secondary | ICD-10-CM | POA: Diagnosis not present

## 2017-03-18 DIAGNOSIS — H353113 Nonexudative age-related macular degeneration, right eye, advanced atrophic without subfoveal involvement: Secondary | ICD-10-CM | POA: Diagnosis not present

## 2017-03-18 DIAGNOSIS — H353211 Exudative age-related macular degeneration, right eye, with active choroidal neovascularization: Secondary | ICD-10-CM | POA: Diagnosis not present

## 2017-03-18 DIAGNOSIS — H353123 Nonexudative age-related macular degeneration, left eye, advanced atrophic without subfoveal involvement: Secondary | ICD-10-CM | POA: Diagnosis not present

## 2017-03-21 DIAGNOSIS — H35372 Puckering of macula, left eye: Secondary | ICD-10-CM | POA: Diagnosis not present

## 2017-03-21 DIAGNOSIS — H353123 Nonexudative age-related macular degeneration, left eye, advanced atrophic without subfoveal involvement: Secondary | ICD-10-CM | POA: Diagnosis not present

## 2017-03-21 DIAGNOSIS — H353221 Exudative age-related macular degeneration, left eye, with active choroidal neovascularization: Secondary | ICD-10-CM | POA: Diagnosis not present

## 2017-04-24 DIAGNOSIS — H353211 Exudative age-related macular degeneration, right eye, with active choroidal neovascularization: Secondary | ICD-10-CM | POA: Diagnosis not present

## 2017-04-24 DIAGNOSIS — H353113 Nonexudative age-related macular degeneration, right eye, advanced atrophic without subfoveal involvement: Secondary | ICD-10-CM | POA: Diagnosis not present

## 2017-04-24 DIAGNOSIS — H43821 Vitreomacular adhesion, right eye: Secondary | ICD-10-CM | POA: Diagnosis not present

## 2017-04-24 DIAGNOSIS — H353221 Exudative age-related macular degeneration, left eye, with active choroidal neovascularization: Secondary | ICD-10-CM | POA: Diagnosis not present

## 2017-05-03 DIAGNOSIS — I517 Cardiomegaly: Secondary | ICD-10-CM | POA: Diagnosis not present

## 2017-05-03 DIAGNOSIS — E1129 Type 2 diabetes mellitus with other diabetic kidney complication: Secondary | ICD-10-CM | POA: Diagnosis not present

## 2017-05-03 DIAGNOSIS — C439 Malignant melanoma of skin, unspecified: Secondary | ICD-10-CM | POA: Diagnosis not present

## 2017-05-03 DIAGNOSIS — D472 Monoclonal gammopathy: Secondary | ICD-10-CM | POA: Diagnosis not present

## 2017-05-03 DIAGNOSIS — D692 Other nonthrombocytopenic purpura: Secondary | ICD-10-CM | POA: Diagnosis not present

## 2017-05-03 DIAGNOSIS — Z6826 Body mass index (BMI) 26.0-26.9, adult: Secondary | ICD-10-CM | POA: Diagnosis not present

## 2017-05-03 DIAGNOSIS — M109 Gout, unspecified: Secondary | ICD-10-CM | POA: Diagnosis not present

## 2017-05-03 DIAGNOSIS — I129 Hypertensive chronic kidney disease with stage 1 through stage 4 chronic kidney disease, or unspecified chronic kidney disease: Secondary | ICD-10-CM | POA: Diagnosis not present

## 2017-05-03 DIAGNOSIS — I350 Nonrheumatic aortic (valve) stenosis: Secondary | ICD-10-CM | POA: Diagnosis not present

## 2017-05-03 DIAGNOSIS — N183 Chronic kidney disease, stage 3 (moderate): Secondary | ICD-10-CM | POA: Diagnosis not present

## 2017-05-03 DIAGNOSIS — R2689 Other abnormalities of gait and mobility: Secondary | ICD-10-CM | POA: Diagnosis not present

## 2017-05-06 DIAGNOSIS — H353221 Exudative age-related macular degeneration, left eye, with active choroidal neovascularization: Secondary | ICD-10-CM | POA: Diagnosis not present

## 2017-05-06 DIAGNOSIS — H35372 Puckering of macula, left eye: Secondary | ICD-10-CM | POA: Diagnosis not present

## 2017-05-06 DIAGNOSIS — H43821 Vitreomacular adhesion, right eye: Secondary | ICD-10-CM | POA: Diagnosis not present

## 2017-05-06 DIAGNOSIS — H353113 Nonexudative age-related macular degeneration, right eye, advanced atrophic without subfoveal involvement: Secondary | ICD-10-CM | POA: Diagnosis not present

## 2017-05-06 DIAGNOSIS — H353211 Exudative age-related macular degeneration, right eye, with active choroidal neovascularization: Secondary | ICD-10-CM | POA: Diagnosis not present

## 2017-05-10 ENCOUNTER — Ambulatory Visit: Payer: Medicare Other | Admitting: Neurology

## 2017-06-05 DIAGNOSIS — H353211 Exudative age-related macular degeneration, right eye, with active choroidal neovascularization: Secondary | ICD-10-CM | POA: Diagnosis not present

## 2017-06-05 DIAGNOSIS — H353123 Nonexudative age-related macular degeneration, left eye, advanced atrophic without subfoveal involvement: Secondary | ICD-10-CM | POA: Diagnosis not present

## 2017-06-05 DIAGNOSIS — H43821 Vitreomacular adhesion, right eye: Secondary | ICD-10-CM | POA: Diagnosis not present

## 2017-06-05 DIAGNOSIS — H353113 Nonexudative age-related macular degeneration, right eye, advanced atrophic without subfoveal involvement: Secondary | ICD-10-CM | POA: Diagnosis not present

## 2017-06-05 DIAGNOSIS — H353221 Exudative age-related macular degeneration, left eye, with active choroidal neovascularization: Secondary | ICD-10-CM | POA: Diagnosis not present

## 2017-06-17 DIAGNOSIS — H43821 Vitreomacular adhesion, right eye: Secondary | ICD-10-CM | POA: Diagnosis not present

## 2017-06-17 DIAGNOSIS — H353113 Nonexudative age-related macular degeneration, right eye, advanced atrophic without subfoveal involvement: Secondary | ICD-10-CM | POA: Diagnosis not present

## 2017-06-17 DIAGNOSIS — H35352 Cystoid macular degeneration, left eye: Secondary | ICD-10-CM | POA: Diagnosis not present

## 2017-06-17 DIAGNOSIS — H353123 Nonexudative age-related macular degeneration, left eye, advanced atrophic without subfoveal involvement: Secondary | ICD-10-CM | POA: Diagnosis not present

## 2017-06-17 DIAGNOSIS — H35372 Puckering of macula, left eye: Secondary | ICD-10-CM | POA: Diagnosis not present

## 2017-06-17 DIAGNOSIS — H353211 Exudative age-related macular degeneration, right eye, with active choroidal neovascularization: Secondary | ICD-10-CM | POA: Diagnosis not present

## 2017-06-17 DIAGNOSIS — H353221 Exudative age-related macular degeneration, left eye, with active choroidal neovascularization: Secondary | ICD-10-CM | POA: Diagnosis not present

## 2017-07-11 DIAGNOSIS — H353211 Exudative age-related macular degeneration, right eye, with active choroidal neovascularization: Secondary | ICD-10-CM | POA: Diagnosis not present

## 2017-07-11 DIAGNOSIS — H43821 Vitreomacular adhesion, right eye: Secondary | ICD-10-CM | POA: Diagnosis not present

## 2017-07-11 DIAGNOSIS — H353221 Exudative age-related macular degeneration, left eye, with active choroidal neovascularization: Secondary | ICD-10-CM | POA: Diagnosis not present

## 2017-07-11 DIAGNOSIS — H353113 Nonexudative age-related macular degeneration, right eye, advanced atrophic without subfoveal involvement: Secondary | ICD-10-CM | POA: Diagnosis not present

## 2017-08-05 DIAGNOSIS — H353211 Exudative age-related macular degeneration, right eye, with active choroidal neovascularization: Secondary | ICD-10-CM | POA: Diagnosis not present

## 2017-08-05 DIAGNOSIS — H43821 Vitreomacular adhesion, right eye: Secondary | ICD-10-CM | POA: Diagnosis not present

## 2017-08-05 DIAGNOSIS — H353221 Exudative age-related macular degeneration, left eye, with active choroidal neovascularization: Secondary | ICD-10-CM | POA: Diagnosis not present

## 2017-08-05 DIAGNOSIS — H353113 Nonexudative age-related macular degeneration, right eye, advanced atrophic without subfoveal involvement: Secondary | ICD-10-CM | POA: Diagnosis not present

## 2017-08-05 DIAGNOSIS — H43811 Vitreous degeneration, right eye: Secondary | ICD-10-CM | POA: Diagnosis not present

## 2017-08-15 DIAGNOSIS — H353123 Nonexudative age-related macular degeneration, left eye, advanced atrophic without subfoveal involvement: Secondary | ICD-10-CM | POA: Diagnosis not present

## 2017-08-15 DIAGNOSIS — H353221 Exudative age-related macular degeneration, left eye, with active choroidal neovascularization: Secondary | ICD-10-CM | POA: Diagnosis not present

## 2017-08-15 DIAGNOSIS — H43821 Vitreomacular adhesion, right eye: Secondary | ICD-10-CM | POA: Diagnosis not present

## 2017-08-15 DIAGNOSIS — H35371 Puckering of macula, right eye: Secondary | ICD-10-CM | POA: Diagnosis not present

## 2017-08-15 DIAGNOSIS — H353211 Exudative age-related macular degeneration, right eye, with active choroidal neovascularization: Secondary | ICD-10-CM | POA: Diagnosis not present

## 2017-08-15 DIAGNOSIS — H353113 Nonexudative age-related macular degeneration, right eye, advanced atrophic without subfoveal involvement: Secondary | ICD-10-CM | POA: Diagnosis not present

## 2017-09-26 DIAGNOSIS — H43812 Vitreous degeneration, left eye: Secondary | ICD-10-CM | POA: Diagnosis not present

## 2017-09-26 DIAGNOSIS — H35371 Puckering of macula, right eye: Secondary | ICD-10-CM | POA: Diagnosis not present

## 2017-09-26 DIAGNOSIS — H353123 Nonexudative age-related macular degeneration, left eye, advanced atrophic without subfoveal involvement: Secondary | ICD-10-CM | POA: Diagnosis not present

## 2017-09-26 DIAGNOSIS — H353221 Exudative age-related macular degeneration, left eye, with active choroidal neovascularization: Secondary | ICD-10-CM | POA: Diagnosis not present

## 2017-09-26 DIAGNOSIS — H35372 Puckering of macula, left eye: Secondary | ICD-10-CM | POA: Diagnosis not present

## 2017-10-09 DIAGNOSIS — H353113 Nonexudative age-related macular degeneration, right eye, advanced atrophic without subfoveal involvement: Secondary | ICD-10-CM | POA: Diagnosis not present

## 2017-10-09 DIAGNOSIS — H353211 Exudative age-related macular degeneration, right eye, with active choroidal neovascularization: Secondary | ICD-10-CM | POA: Diagnosis not present

## 2017-10-09 DIAGNOSIS — H35371 Puckering of macula, right eye: Secondary | ICD-10-CM | POA: Diagnosis not present

## 2017-10-09 DIAGNOSIS — H43811 Vitreous degeneration, right eye: Secondary | ICD-10-CM | POA: Diagnosis not present

## 2017-10-09 DIAGNOSIS — H353221 Exudative age-related macular degeneration, left eye, with active choroidal neovascularization: Secondary | ICD-10-CM | POA: Diagnosis not present

## 2017-10-29 DIAGNOSIS — Z125 Encounter for screening for malignant neoplasm of prostate: Secondary | ICD-10-CM | POA: Diagnosis not present

## 2017-10-29 DIAGNOSIS — E7849 Other hyperlipidemia: Secondary | ICD-10-CM | POA: Diagnosis not present

## 2017-10-29 DIAGNOSIS — M109 Gout, unspecified: Secondary | ICD-10-CM | POA: Diagnosis not present

## 2017-10-29 DIAGNOSIS — N183 Chronic kidney disease, stage 3 (moderate): Secondary | ICD-10-CM | POA: Diagnosis not present

## 2017-10-29 DIAGNOSIS — E1129 Type 2 diabetes mellitus with other diabetic kidney complication: Secondary | ICD-10-CM | POA: Diagnosis not present

## 2017-10-31 DIAGNOSIS — R82998 Other abnormal findings in urine: Secondary | ICD-10-CM | POA: Diagnosis not present

## 2017-10-31 DIAGNOSIS — I10 Essential (primary) hypertension: Secondary | ICD-10-CM | POA: Diagnosis not present

## 2017-11-05 DIAGNOSIS — H409 Unspecified glaucoma: Secondary | ICD-10-CM | POA: Diagnosis not present

## 2017-11-05 DIAGNOSIS — C439 Malignant melanoma of skin, unspecified: Secondary | ICD-10-CM | POA: Diagnosis not present

## 2017-11-05 DIAGNOSIS — I517 Cardiomegaly: Secondary | ICD-10-CM | POA: Diagnosis not present

## 2017-11-05 DIAGNOSIS — Z23 Encounter for immunization: Secondary | ICD-10-CM | POA: Diagnosis not present

## 2017-11-05 DIAGNOSIS — I350 Nonrheumatic aortic (valve) stenosis: Secondary | ICD-10-CM | POA: Diagnosis not present

## 2017-11-05 DIAGNOSIS — N183 Chronic kidney disease, stage 3 (moderate): Secondary | ICD-10-CM | POA: Diagnosis not present

## 2017-11-05 DIAGNOSIS — Z1389 Encounter for screening for other disorder: Secondary | ICD-10-CM | POA: Diagnosis not present

## 2017-11-05 DIAGNOSIS — R2689 Other abnormalities of gait and mobility: Secondary | ICD-10-CM | POA: Diagnosis not present

## 2017-11-05 DIAGNOSIS — D472 Monoclonal gammopathy: Secondary | ICD-10-CM | POA: Diagnosis not present

## 2017-11-05 DIAGNOSIS — Z0000 Encounter for general adult medical examination without abnormal findings: Secondary | ICD-10-CM | POA: Diagnosis not present

## 2017-11-05 DIAGNOSIS — Z6827 Body mass index (BMI) 27.0-27.9, adult: Secondary | ICD-10-CM | POA: Diagnosis not present

## 2017-11-05 DIAGNOSIS — E1129 Type 2 diabetes mellitus with other diabetic kidney complication: Secondary | ICD-10-CM | POA: Diagnosis not present

## 2017-11-05 DIAGNOSIS — I129 Hypertensive chronic kidney disease with stage 1 through stage 4 chronic kidney disease, or unspecified chronic kidney disease: Secondary | ICD-10-CM | POA: Diagnosis not present

## 2017-11-20 DIAGNOSIS — H353221 Exudative age-related macular degeneration, left eye, with active choroidal neovascularization: Secondary | ICD-10-CM | POA: Diagnosis not present

## 2017-11-20 DIAGNOSIS — H40052 Ocular hypertension, left eye: Secondary | ICD-10-CM | POA: Diagnosis not present

## 2017-11-20 DIAGNOSIS — H353211 Exudative age-related macular degeneration, right eye, with active choroidal neovascularization: Secondary | ICD-10-CM | POA: Diagnosis not present

## 2017-11-20 DIAGNOSIS — H401121 Primary open-angle glaucoma, left eye, mild stage: Secondary | ICD-10-CM | POA: Diagnosis not present

## 2017-11-20 DIAGNOSIS — H43821 Vitreomacular adhesion, right eye: Secondary | ICD-10-CM | POA: Diagnosis not present

## 2017-11-20 DIAGNOSIS — H353113 Nonexudative age-related macular degeneration, right eye, advanced atrophic without subfoveal involvement: Secondary | ICD-10-CM | POA: Diagnosis not present

## 2017-11-21 DIAGNOSIS — H35373 Puckering of macula, bilateral: Secondary | ICD-10-CM | POA: Diagnosis not present

## 2017-11-21 DIAGNOSIS — H353230 Exudative age-related macular degeneration, bilateral, stage unspecified: Secondary | ICD-10-CM | POA: Diagnosis not present

## 2017-11-21 DIAGNOSIS — H02831 Dermatochalasis of right upper eyelid: Secondary | ICD-10-CM | POA: Diagnosis not present

## 2017-11-21 DIAGNOSIS — H02834 Dermatochalasis of left upper eyelid: Secondary | ICD-10-CM | POA: Diagnosis not present

## 2017-11-21 DIAGNOSIS — Z961 Presence of intraocular lens: Secondary | ICD-10-CM | POA: Diagnosis not present

## 2017-11-21 DIAGNOSIS — H353231 Exudative age-related macular degeneration, bilateral, with active choroidal neovascularization: Secondary | ICD-10-CM | POA: Diagnosis not present

## 2017-11-21 DIAGNOSIS — H40052 Ocular hypertension, left eye: Secondary | ICD-10-CM | POA: Diagnosis not present

## 2017-11-21 DIAGNOSIS — E119 Type 2 diabetes mellitus without complications: Secondary | ICD-10-CM | POA: Diagnosis not present

## 2017-11-21 DIAGNOSIS — H40013 Open angle with borderline findings, low risk, bilateral: Secondary | ICD-10-CM | POA: Diagnosis not present

## 2017-11-25 DIAGNOSIS — H40052 Ocular hypertension, left eye: Secondary | ICD-10-CM | POA: Diagnosis not present

## 2017-11-25 DIAGNOSIS — H02831 Dermatochalasis of right upper eyelid: Secondary | ICD-10-CM | POA: Diagnosis not present

## 2017-11-25 DIAGNOSIS — H02834 Dermatochalasis of left upper eyelid: Secondary | ICD-10-CM | POA: Diagnosis not present

## 2017-11-25 DIAGNOSIS — Z961 Presence of intraocular lens: Secondary | ICD-10-CM | POA: Diagnosis not present

## 2017-11-25 DIAGNOSIS — E119 Type 2 diabetes mellitus without complications: Secondary | ICD-10-CM | POA: Diagnosis not present

## 2017-11-25 DIAGNOSIS — H35373 Puckering of macula, bilateral: Secondary | ICD-10-CM | POA: Diagnosis not present

## 2017-11-25 DIAGNOSIS — H353230 Exudative age-related macular degeneration, bilateral, stage unspecified: Secondary | ICD-10-CM | POA: Diagnosis not present

## 2017-11-27 DIAGNOSIS — H353123 Nonexudative age-related macular degeneration, left eye, advanced atrophic without subfoveal involvement: Secondary | ICD-10-CM | POA: Diagnosis not present

## 2017-11-27 DIAGNOSIS — H401121 Primary open-angle glaucoma, left eye, mild stage: Secondary | ICD-10-CM | POA: Diagnosis not present

## 2017-11-27 DIAGNOSIS — H35372 Puckering of macula, left eye: Secondary | ICD-10-CM | POA: Diagnosis not present

## 2017-11-27 DIAGNOSIS — H35352 Cystoid macular degeneration, left eye: Secondary | ICD-10-CM | POA: Diagnosis not present

## 2017-11-27 DIAGNOSIS — H353221 Exudative age-related macular degeneration, left eye, with active choroidal neovascularization: Secondary | ICD-10-CM | POA: Diagnosis not present

## 2017-12-04 DIAGNOSIS — H353221 Exudative age-related macular degeneration, left eye, with active choroidal neovascularization: Secondary | ICD-10-CM | POA: Diagnosis not present

## 2017-12-04 DIAGNOSIS — H43821 Vitreomacular adhesion, right eye: Secondary | ICD-10-CM | POA: Diagnosis not present

## 2017-12-04 DIAGNOSIS — H353211 Exudative age-related macular degeneration, right eye, with active choroidal neovascularization: Secondary | ICD-10-CM | POA: Diagnosis not present

## 2017-12-04 DIAGNOSIS — H35371 Puckering of macula, right eye: Secondary | ICD-10-CM | POA: Diagnosis not present

## 2017-12-06 DIAGNOSIS — D485 Neoplasm of uncertain behavior of skin: Secondary | ICD-10-CM | POA: Diagnosis not present

## 2017-12-06 DIAGNOSIS — Z85828 Personal history of other malignant neoplasm of skin: Secondary | ICD-10-CM | POA: Diagnosis not present

## 2017-12-06 DIAGNOSIS — L309 Dermatitis, unspecified: Secondary | ICD-10-CM | POA: Diagnosis not present

## 2017-12-06 DIAGNOSIS — L570 Actinic keratosis: Secondary | ICD-10-CM | POA: Diagnosis not present

## 2017-12-06 DIAGNOSIS — L308 Other specified dermatitis: Secondary | ICD-10-CM | POA: Diagnosis not present

## 2017-12-06 DIAGNOSIS — C44622 Squamous cell carcinoma of skin of right upper limb, including shoulder: Secondary | ICD-10-CM | POA: Diagnosis not present

## 2017-12-18 DIAGNOSIS — R351 Nocturia: Secondary | ICD-10-CM | POA: Diagnosis not present

## 2017-12-18 DIAGNOSIS — N401 Enlarged prostate with lower urinary tract symptoms: Secondary | ICD-10-CM | POA: Diagnosis not present

## 2017-12-19 DIAGNOSIS — Z961 Presence of intraocular lens: Secondary | ICD-10-CM | POA: Diagnosis not present

## 2017-12-19 DIAGNOSIS — H02834 Dermatochalasis of left upper eyelid: Secondary | ICD-10-CM | POA: Diagnosis not present

## 2017-12-19 DIAGNOSIS — H02831 Dermatochalasis of right upper eyelid: Secondary | ICD-10-CM | POA: Diagnosis not present

## 2017-12-19 DIAGNOSIS — H40052 Ocular hypertension, left eye: Secondary | ICD-10-CM | POA: Diagnosis not present

## 2017-12-23 DIAGNOSIS — H40013 Open angle with borderline findings, low risk, bilateral: Secondary | ICD-10-CM | POA: Diagnosis not present

## 2017-12-23 DIAGNOSIS — H40052 Ocular hypertension, left eye: Secondary | ICD-10-CM | POA: Diagnosis not present

## 2017-12-23 DIAGNOSIS — Z961 Presence of intraocular lens: Secondary | ICD-10-CM | POA: Diagnosis not present

## 2017-12-26 DIAGNOSIS — M1711 Unilateral primary osteoarthritis, right knee: Secondary | ICD-10-CM | POA: Diagnosis not present

## 2017-12-26 DIAGNOSIS — M170 Bilateral primary osteoarthritis of knee: Secondary | ICD-10-CM | POA: Diagnosis not present

## 2017-12-26 DIAGNOSIS — M25561 Pain in right knee: Secondary | ICD-10-CM | POA: Diagnosis not present

## 2018-01-07 IMAGING — MR MR HEAD W/O CM
9 of 10 series · 42 of 48 positions shown · non-contrast
Comparison: CT head 06/28/2016, 04/06/2013

CLINICAL DATA: Gait disturbance

EXAM:
MRI HEAD WITHOUT CONTRAST
TECHNIQUE: Multiplanar, multiecho pulse sequences of the brain and surrounding
structures were obtained without intravenous contrast.

[Series 2: T1 · sagittal · 5.0mm · 0.45mm/px · 3 of 24 slices shown]
[im 1/24]
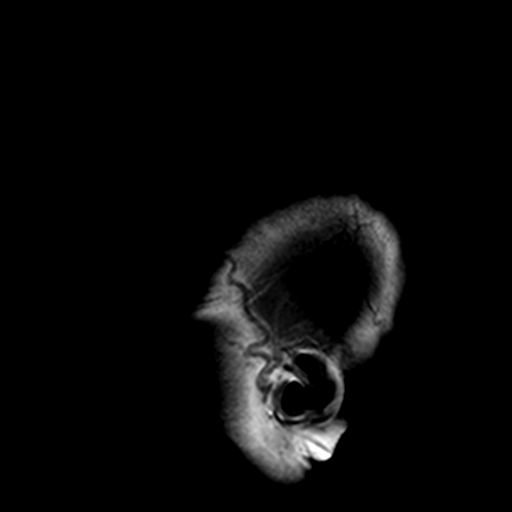
[im 12/24]
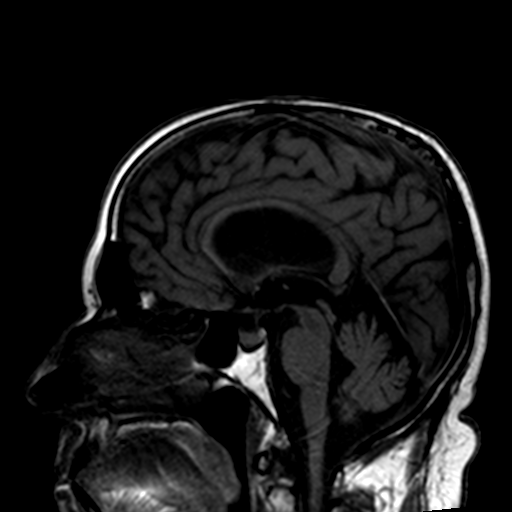
[im 24/24]
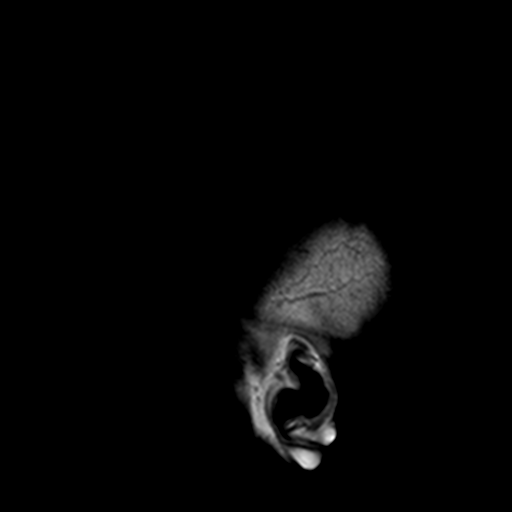

[Series 3: DWI · axial · 3.0mm · 2.19mm/px · z∈[-38,+116]mm · 10 of 97 slices shown (1 of 4)]
[im 1/97]
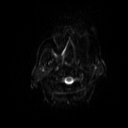
[im 11/97]
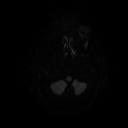
[im 22/97]
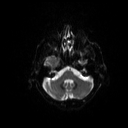
[im 33/97]
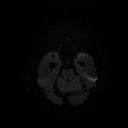
[im 43/97]
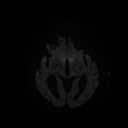
[im 54/97]
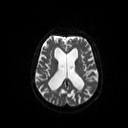
[im 65/97]
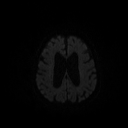
[im 75/97]
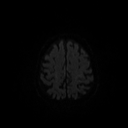
[im 86/97]
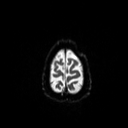
[im 97/97]
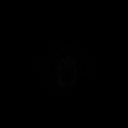

[Series 4: DWI · axial · 3.0mm · 2.19mm/px · z∈[-38,+116]mm · 5 of 49 slices shown (2 of 4)]
[im 1/49]
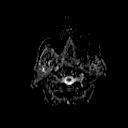
[im 13/49]
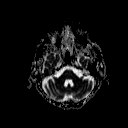
[im 25/49]
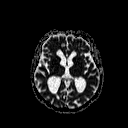
[im 37/49]
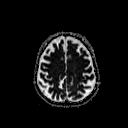
[im 49/49]
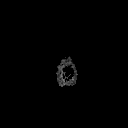

[Series 5: DWI · coronal · 3.0mm · 1.46mm/px · 9 of 90 slices shown (3 of 4)]
[im 1/90]
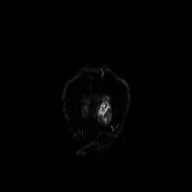
[im 12/90]
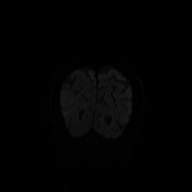
[im 23/90]
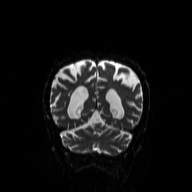
[im 34/90]
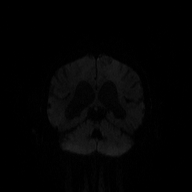
[im 45/90]
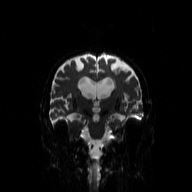
[im 56/90]
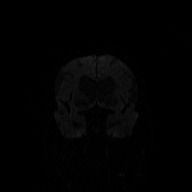
[im 67/90]
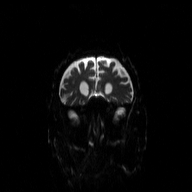
[im 78/90]
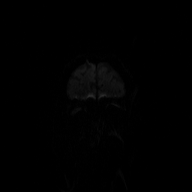
[im 90/90]
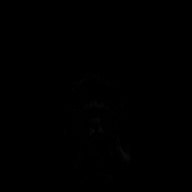

[Series 6: DWI · coronal · 3.0mm · 1.46mm/px · 5 of 45 slices shown (4 of 4)]
[im 1/45]
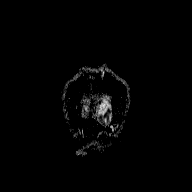
[im 12/45]
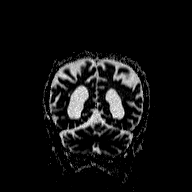
[im 23/45]
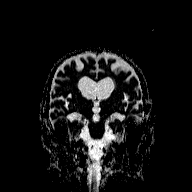
[im 34/45]
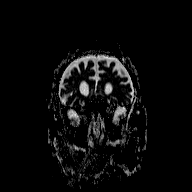
[im 45/45]
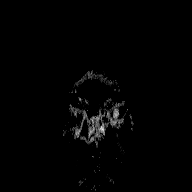

[Series 7: T2 · axial · 5.0mm · 0.45mm/px · z∈[-58,+101]mm · 2 of 24 slices shown (1 of 2)]
[im 1/24]
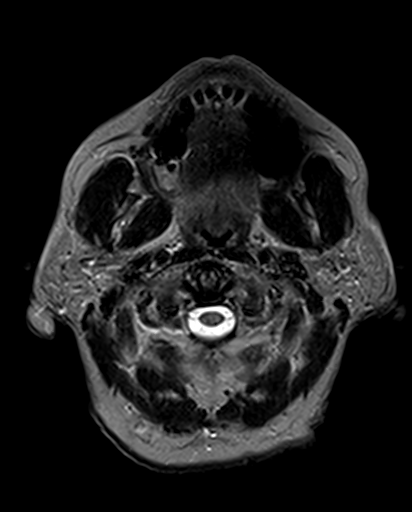
[im 24/24]
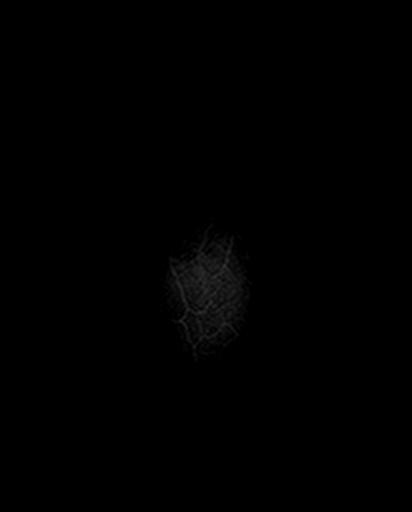

[Series 8: T2 · axial · 5.0mm · 0.45mm/px · z∈[-58,+101]mm · 2 of 24 slices shown (2 of 2)]
[im 1/24]
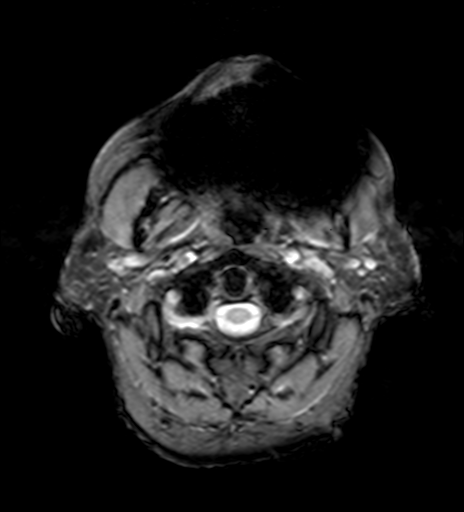
[im 24/24]
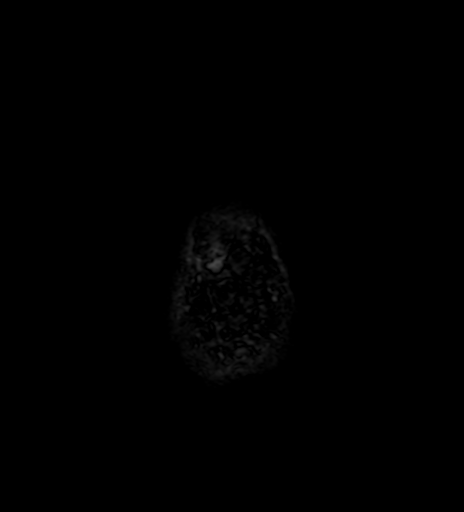

[Series 9: FLAIR · axial · 3.0mm · 0.45mm/px · z∈[-58,+101]mm · 3 of 28 slices shown]
[im 1/28]
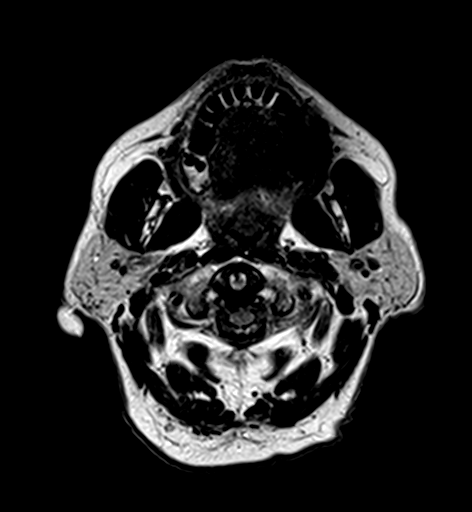
[im 14/28]
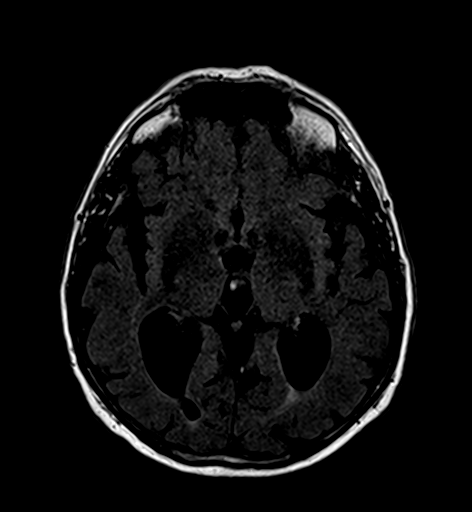
[im 28/28]
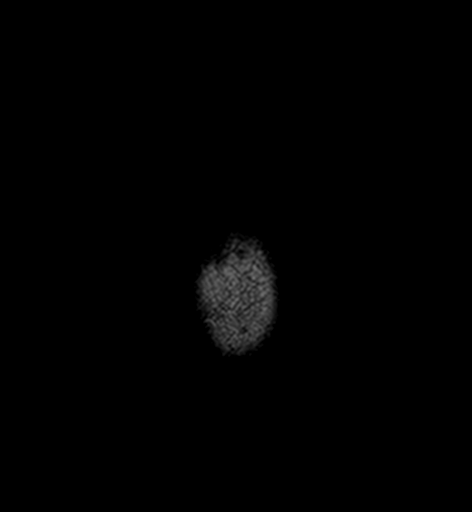

[Series 11: T2 post-contrast · coronal · 5.0mm · 0.45mm/px · 3 of 28 slices shown]
[im 1/28]
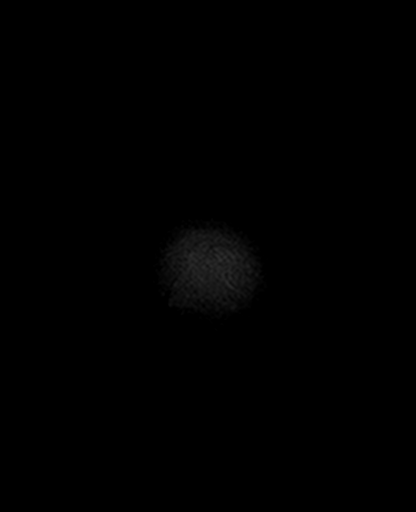
[im 14/28]
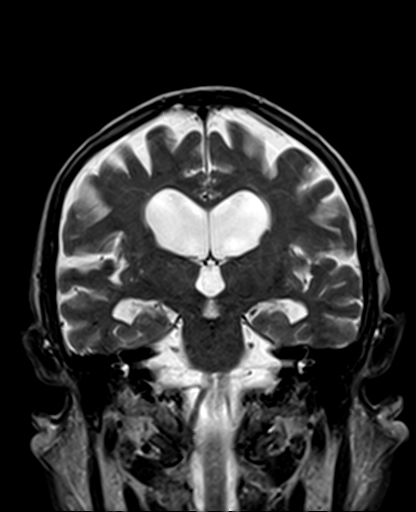
[im 28/28]
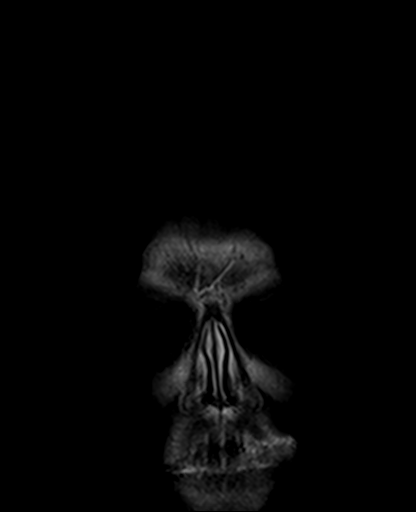

[42 of 48 positions shown; findings below may reference images not displayed]

FINDINGS: Brain: Moderate diffuse ventricular enlargement is unchanged from
prior studies including 6485. Prominent temporal horns. There is
diffuse atrophy. Ventricular enlargement is greater than expected
for atrophy and communicating hydrocephalus is possible.

Negative for acute infarct. Mild chronic ischemic changes in the
white matter. Brainstem intact. Negative for hemorrhage or fluid
collection. Negative for mass or edema.

Vascular: Normal arterial flow voids.

Skull and upper cervical spine: Negative

Sinuses/Orbits: Mild mucosal edema paranasal sinuses. Bilateral lens
replacement.

Other: None
IMPRESSION: Moderate ventricular enlargement. This is stable over prior studies.
This could be due to normal pressure hydrocephalus versus central
atrophy

Negative for acute infarct. Mild chronic microvascular ischemic
change in the white matter.

## 2018-01-10 ENCOUNTER — Other Ambulatory Visit: Payer: Self-pay | Admitting: Internal Medicine

## 2018-01-16 DIAGNOSIS — Z961 Presence of intraocular lens: Secondary | ICD-10-CM | POA: Diagnosis not present

## 2018-01-16 DIAGNOSIS — H401123 Primary open-angle glaucoma, left eye, severe stage: Secondary | ICD-10-CM | POA: Diagnosis not present

## 2018-01-16 DIAGNOSIS — H401111 Primary open-angle glaucoma, right eye, mild stage: Secondary | ICD-10-CM | POA: Diagnosis not present

## 2018-01-24 DIAGNOSIS — H401111 Primary open-angle glaucoma, right eye, mild stage: Secondary | ICD-10-CM | POA: Diagnosis not present

## 2018-01-24 DIAGNOSIS — Z961 Presence of intraocular lens: Secondary | ICD-10-CM | POA: Diagnosis not present

## 2018-01-24 DIAGNOSIS — H401123 Primary open-angle glaucoma, left eye, severe stage: Secondary | ICD-10-CM | POA: Diagnosis not present

## 2018-02-05 DIAGNOSIS — H353211 Exudative age-related macular degeneration, right eye, with active choroidal neovascularization: Secondary | ICD-10-CM | POA: Diagnosis not present

## 2018-02-05 DIAGNOSIS — H43821 Vitreomacular adhesion, right eye: Secondary | ICD-10-CM | POA: Diagnosis not present

## 2018-02-05 DIAGNOSIS — H353113 Nonexudative age-related macular degeneration, right eye, advanced atrophic without subfoveal involvement: Secondary | ICD-10-CM | POA: Diagnosis not present

## 2018-02-05 DIAGNOSIS — H353221 Exudative age-related macular degeneration, left eye, with active choroidal neovascularization: Secondary | ICD-10-CM | POA: Diagnosis not present

## 2018-02-12 DIAGNOSIS — H353221 Exudative age-related macular degeneration, left eye, with active choroidal neovascularization: Secondary | ICD-10-CM | POA: Diagnosis not present

## 2018-02-12 DIAGNOSIS — H35372 Puckering of macula, left eye: Secondary | ICD-10-CM | POA: Diagnosis not present

## 2018-02-12 DIAGNOSIS — H353123 Nonexudative age-related macular degeneration, left eye, advanced atrophic without subfoveal involvement: Secondary | ICD-10-CM | POA: Diagnosis not present

## 2018-03-14 DIAGNOSIS — Z961 Presence of intraocular lens: Secondary | ICD-10-CM | POA: Diagnosis not present

## 2018-03-14 DIAGNOSIS — H401123 Primary open-angle glaucoma, left eye, severe stage: Secondary | ICD-10-CM | POA: Diagnosis not present

## 2018-03-14 DIAGNOSIS — H401111 Primary open-angle glaucoma, right eye, mild stage: Secondary | ICD-10-CM | POA: Diagnosis not present

## 2018-03-18 DIAGNOSIS — H353113 Nonexudative age-related macular degeneration, right eye, advanced atrophic without subfoveal involvement: Secondary | ICD-10-CM | POA: Diagnosis not present

## 2018-03-18 DIAGNOSIS — H353221 Exudative age-related macular degeneration, left eye, with active choroidal neovascularization: Secondary | ICD-10-CM | POA: Diagnosis not present

## 2018-03-18 DIAGNOSIS — H353123 Nonexudative age-related macular degeneration, left eye, advanced atrophic without subfoveal involvement: Secondary | ICD-10-CM | POA: Diagnosis not present

## 2018-03-18 DIAGNOSIS — H353211 Exudative age-related macular degeneration, right eye, with active choroidal neovascularization: Secondary | ICD-10-CM | POA: Diagnosis not present

## 2018-03-18 DIAGNOSIS — H43821 Vitreomacular adhesion, right eye: Secondary | ICD-10-CM | POA: Diagnosis not present

## 2018-04-01 DIAGNOSIS — H353211 Exudative age-related macular degeneration, right eye, with active choroidal neovascularization: Secondary | ICD-10-CM | POA: Diagnosis not present

## 2018-04-01 DIAGNOSIS — H353113 Nonexudative age-related macular degeneration, right eye, advanced atrophic without subfoveal involvement: Secondary | ICD-10-CM | POA: Diagnosis not present

## 2018-04-01 DIAGNOSIS — H43821 Vitreomacular adhesion, right eye: Secondary | ICD-10-CM | POA: Diagnosis not present

## 2018-04-01 DIAGNOSIS — H353221 Exudative age-related macular degeneration, left eye, with active choroidal neovascularization: Secondary | ICD-10-CM | POA: Diagnosis not present

## 2018-04-01 DIAGNOSIS — H353123 Nonexudative age-related macular degeneration, left eye, advanced atrophic without subfoveal involvement: Secondary | ICD-10-CM | POA: Diagnosis not present

## 2018-04-21 ENCOUNTER — Ambulatory Visit (INDEPENDENT_AMBULATORY_CARE_PROVIDER_SITE_OTHER): Payer: Medicare Other | Admitting: Family Medicine

## 2018-04-21 ENCOUNTER — Encounter: Payer: Self-pay | Admitting: Family Medicine

## 2018-04-21 ENCOUNTER — Ambulatory Visit (INDEPENDENT_AMBULATORY_CARE_PROVIDER_SITE_OTHER)
Admission: RE | Admit: 2018-04-21 | Discharge: 2018-04-21 | Disposition: A | Discharge status: Home / Self Care | Payer: Medicare Other | Source: Ambulatory Visit | Attending: Family Medicine | Admitting: Family Medicine

## 2018-04-21 VITALS — BP 138/84 | HR 61 | Ht 63.0 in | Wt 151.0 lb

## 2018-04-21 DIAGNOSIS — M545 Low back pain, unspecified: Secondary | ICD-10-CM

## 2018-04-21 DIAGNOSIS — M216X9 Other acquired deformities of unspecified foot: Secondary | ICD-10-CM | POA: Diagnosis not present

## 2018-04-21 DIAGNOSIS — M5136 Other intervertebral disc degeneration, lumbar region: Secondary | ICD-10-CM | POA: Diagnosis not present

## 2018-04-21 MED ORDER — VITAMIN D (ERGOCALCIFEROL) 1.25 MG (50000 UNIT) PO CAPS: 50000.0000 [IU] | ORAL_CAPSULE | ORAL | 0 refills | Status: DC

## 2018-04-21 NOTE — Patient Instructions (Addendum)
Good to see you  Ice 20 minutes 2 times daily. Usually after activity and before bed. Exercises 3 times a week. Alternate the back and the feet Spenco orthotics "total support" online would be great  Good shoes with rigid bottom.  Adam [name], [place] or New balance greater then 700 or look at [place] as well. Finn comfort are wonderful but very expensive Once weekly vitamin D for 8 weeks Add tart cherry extract any dose at night Xray of back downstairs Stay active See me again in 4-6 weeks to make sure we are making progress

## 2018-04-21 NOTE — Assessment & Plan Note (Signed)
Breakdown of the longitudinal arch noted.  Discussed icing regimen and home exercise.  Discussed which activities to do which wants to avoid.  Patient is to increase activity slowly over the course of next several weeks.  Follow-up again in 4 to 8 weeks may need custom orthotics

## 2018-04-21 NOTE — Progress Notes (Signed)
[name] [place], [name] [address] Phone: [phone] Subjective:    Adam [name], am serving as a scribe for Dr. Hulan Saas.    CC: Bilateral hip pain  VHQ:[id number]  Adam [name] is a 85 y.o. male coming in with complaint of bilateral hip and foot pain. Chronic pain in glutes right greater than left. Walking increase his pain. Dull intermittent pain. Has not tried any modalities.   Pain over metatarsal heads in both feet. Pain is dull in character. Being on his feet for prolonged periods increase his pain. Pain increases in dress shoes but feels better in supportive shoes.       Past Medical History:  Diagnosis Date  . Acute meniscal tear of knee RIGHT KNEE  . Arthritis   . BPH (benign prostatic hypertrophy)   . Colon polyps    hyperplastic  . Diabetes mellitus type 2, diet-controlled (HCC) AND EXERCISE-- LAST A1C  5.6  . Diverticulosis   . Gait abnormality 11/08/2016  . GERD (gastroesophageal reflux disease)   . Glaucoma   . H/O hiatal hernia   . Heart murmur   . History of esophageal stricture S/P DILATION IN 2007  . Hyperlipidemia   . Hypertension   . IgM monoclonal gammopathy of uncertain significance ASYMPTOMATIC    FOLLOWED BY DR Alen Blew  . Internal hemorrhoids   . Macrocytic anemia DUE TO CHRONIC RENAL INSUFF.  . Nocturia   . Right knee pain    Past Surgical History:  Procedure Laterality Date  . CATARACT EXTRACTION W/ INTRAOCULAR LENS  IMPLANT, BILATERAL  2012  . HEMORRHOID SURGERY    . HERNIA REPAIR    . KNEE ARTHROSCOPY  12/25/2011   Procedure: ARTHROSCOPY KNEE;  Surgeon: Tobi Bastos, MD;  Location: [facility];  Service: Orthopedics;  Laterality: Right;  right knee arthroscopy with medial menisectomy     Social History   Socioeconomic History  . Marital status: Married    Spouse name: Mechele Claude  . Number of children: 2  . Years of education: 33  . Highest education level: Not on  file  Occupational History  . Not on file  Social Needs  . Financial resource strain: Not on file  . Food insecurity:    Worry: Not on file    Inability: Not on file  . Transportation needs:    Medical: Not on file    Non-medical: Not on file  Tobacco Use  . Smoking status: Former Smoker    Packs/day: 1.00    Years: 10.00    Pack years: 10.00    Types: Cigarettes  . Smokeless tobacco: Never Used  Substance and Sexual Activity  . Alcohol use: Yes    Alcohol/week: 14.0 standard drinks    Types: 14 Standard drinks or equivalent per week  . Drug use: [name]  . Sexual activity: Not on file  Lifestyle  . Physical activity:    Days per week: Not on file    Minutes per session: Not on file  . Stress: Not on file  Relationships  . Social connections:    Talks on phone: Not on file    Gets together: Not on file    Attends religious service: Not on file    Active member of club or organization: Not on file    Attends meetings of clubs or organizations: Not on file    Relationship status: Not on file  Other Topics Concern  . Not  on file  Social History Narrative   Lives with wife, Mechele Claude   Caffeine use: Diet coke daily   Right-handed   [name] Known Allergies Family History  Problem Relation Age of Onset  . Diabetes Brother   . Heart attack Father   . Early death Father   . Hearing loss Father   . Heart disease Father   . Alcohol abuse Sister   . Cancer Sister      Current Outpatient Medications (Cardiovascular):  .  amLODipine (NORVASC) 2.5 MG tablet, Take 1 tablet (2.5 mg total) by mouth daily. Please call and schedule an office visit for further refills. 1st attempt .  atorvastatin (LIPITOR) 40 MG tablet, Take 40 mg by mouth daily. .  cloNIDine (CATAPRES) 0.1 MG tablet, Take 0.5 tablets by mouth 2 (two) times daily. Marland Kitchen  labetalol (NORMODYNE) 100 MG tablet, Take 100 mg by mouth 2 (two) times daily.   Current Outpatient Medications (Analgesics):  .  acetaminophen (TYLENOL)  500 MG tablet, Take 500 mg by mouth every 6 (six) hours as needed. Marland Kitchen  ULORIC 40 MG tablet, Take 40 mg by mouth 3 (three) times a week. Tue, fri, sun   Current Outpatient Medications (Other):  .  brimonidine-timolol (COMBIGAN) 0.2-0.5 % ophthalmic solution, Place 1 drop into both eyes every 12 (twelve) hours. .  clobetasol cream (TEMOVATE) 5.62 %, Apply 1 application topically every evening. .  DORZOLAMIDE HCL-TIMOLOL MAL OP, Apply to eye. .  finasteride (PROSCAR) 5 MG tablet,  .  Multiple Vitamins-Minerals (PRESERVISION AREDS PO), Take by mouth 1 day or 1 dose. .  Netarsudil-Latanoprost (ROCKLATAN) 0.02-0.005 % SOLN, Apply to eye. .  nystatin ointment (MYCOSTATIN), Apply 1 application topically 2 (two) times daily. .  sodium bicarbonate 325 MG tablet, Take 325 mg by mouth 4 (four) times daily. .  vitamin C (ASCORBIC ACID) 500 MG tablet, Take 500 mg by mouth daily. .  Vitamin D, Ergocalciferol, (DRISDOL) 1.25 MG (50000 UT) CAPS capsule, Take 1 capsule (50,000 Units total) by mouth every 7 (seven) days.    Past medical history, social, surgical and family history all reviewed in electronic medical record.  [name] pertanent information unless stated regarding to the chief complaint.   Review of Systems:  [name] headache, visual changes, nausea, vomiting, diarrhea, constipation, dizziness, abdominal pain, skin rash, fevers, chills, night sweats, weight loss, swollen lymph nodes, body aches, joint swelling, chest pain, shortness of breath, mood changes. Positive muscle aches   Objective  Blood pressure 138/84, pulse 61, height 5\' 3"  (1.6 m), weight 151 lb (68.5 kg), SpO2 99 %.   General: [name] apparent distress alert and oriented x3 mood and affect normal, dressed appropriately.  HEENT: Pupils equal, extraocular movements intact  Respiratory: Patient's speak in full sentences and does not appear short of breath  Cardiovascular: Trace lower extremity edema, non tender, [name] erythema  Skin: Warm dry intact  with [name] signs of infection or rash on extremities or on axial skeleton.  Abdomen: Soft nontender  Neuro: Cranial nerves II through XII are intact, neurovascularly intact in all extremities with 2+ DTRs and 2+ pulses.  Lymph: [name] lymphadenopathy of posterior or anterior cervical chain or axillae bilaterally.  Gait antalgic   MSK:  tender with limited range of motion and good stability and symmetric strength and tone of shoulders, elbows, wrist, knee and ankles bilaterally.   Significant arthritic changes of multiple joints.  Back exam has loss of lordosis Patient does have some tenderness to palpation laterally in the back.  Mild atrophy of the lower extremities bilaterally.  Mild arthritic changes of the knees noted.  [name] significant instability.  Foot exam shows the patient does have significant breakdown of the longitudinal and transverse arches.  Patient does have callus formation on the ball of his foot left greater than right.  Neurovascular most seems to be intact.     Impression and Recommendations:     This case required medical decision making of moderate complexity. The above documentation has been reviewed and is accurate and complete Lyndal Pulley, DO       Note: This dictation was prepared with Dragon dictation along with smaller phrase technology. Any transcriptional errors that result from this process are unintentional.

## 2018-04-21 NOTE — Assessment & Plan Note (Signed)
Mild back pain likely causing some radicular symptoms in the legs.  Patient does have some atrophy.  I do believe that some of the numbness is secondary to this as well as the breakdown of the transverse and longitudinal arches of the feet.  With patient having some difficulty with confusion I would like to hold on anything such as gabapentin at the moment.  Try over-the-counter medications, home exercises and core strengthening.  Discussed icing regimen.  Patient will follow-up with me again 4 to 8 weeks

## 2018-04-28 DIAGNOSIS — H43821 Vitreomacular adhesion, right eye: Secondary | ICD-10-CM | POA: Diagnosis not present

## 2018-04-28 DIAGNOSIS — H353113 Nonexudative age-related macular degeneration, right eye, advanced atrophic without subfoveal involvement: Secondary | ICD-10-CM | POA: Diagnosis not present

## 2018-04-28 DIAGNOSIS — H353211 Exudative age-related macular degeneration, right eye, with active choroidal neovascularization: Secondary | ICD-10-CM | POA: Diagnosis not present

## 2018-04-28 DIAGNOSIS — H353221 Exudative age-related macular degeneration, left eye, with active choroidal neovascularization: Secondary | ICD-10-CM | POA: Diagnosis not present

## 2018-04-30 ENCOUNTER — Observation Stay (HOSPITAL_COMMUNITY): Payer: Medicare Other

## 2018-04-30 ENCOUNTER — Other Ambulatory Visit: Payer: Self-pay

## 2018-04-30 ENCOUNTER — Encounter (HOSPITAL_COMMUNITY): Payer: Self-pay

## 2018-04-30 ENCOUNTER — Observation Stay (HOSPITAL_COMMUNITY)
Admission: EM | Admit: 2018-04-30 | Discharge: 2018-05-02 | Disposition: A | Discharge status: Home / Self Care | Payer: Medicare Other | Attending: Internal Medicine | Admitting: Internal Medicine

## 2018-04-30 ENCOUNTER — Emergency Department (HOSPITAL_COMMUNITY): Payer: Medicare Other

## 2018-04-30 DIAGNOSIS — G459 Transient cerebral ischemic attack, unspecified: Secondary | ICD-10-CM | POA: Diagnosis not present

## 2018-04-30 DIAGNOSIS — I10 Essential (primary) hypertension: Secondary | ICD-10-CM | POA: Diagnosis not present

## 2018-04-30 DIAGNOSIS — N400 Enlarged prostate without lower urinary tract symptoms: Secondary | ICD-10-CM | POA: Insufficient documentation

## 2018-04-30 DIAGNOSIS — R2689 Other abnormalities of gait and mobility: Secondary | ICD-10-CM | POA: Diagnosis not present

## 2018-04-30 DIAGNOSIS — R41841 Cognitive communication deficit: Secondary | ICD-10-CM | POA: Insufficient documentation

## 2018-04-30 DIAGNOSIS — R410 Disorientation, unspecified: Secondary | ICD-10-CM

## 2018-04-30 DIAGNOSIS — I6622 Occlusion and stenosis of left posterior cerebral artery: Secondary | ICD-10-CM | POA: Diagnosis not present

## 2018-04-30 DIAGNOSIS — Z87891 Personal history of nicotine dependence: Secondary | ICD-10-CM | POA: Diagnosis not present

## 2018-04-30 DIAGNOSIS — E119 Type 2 diabetes mellitus without complications: Secondary | ICD-10-CM | POA: Diagnosis not present

## 2018-04-30 DIAGNOSIS — R531 Weakness: Secondary | ICD-10-CM | POA: Diagnosis not present

## 2018-04-30 DIAGNOSIS — E785 Hyperlipidemia, unspecified: Secondary | ICD-10-CM | POA: Diagnosis not present

## 2018-04-30 DIAGNOSIS — R482 Apraxia: Secondary | ICD-10-CM | POA: Diagnosis not present

## 2018-04-30 DIAGNOSIS — Z79899 Other long term (current) drug therapy: Secondary | ICD-10-CM | POA: Diagnosis not present

## 2018-04-30 DIAGNOSIS — R51 Headache: Secondary | ICD-10-CM | POA: Diagnosis not present

## 2018-04-30 DIAGNOSIS — I6602 Occlusion and stenosis of left middle cerebral artery: Secondary | ICD-10-CM | POA: Diagnosis not present

## 2018-04-30 LAB — CBC WITH DIFFERENTIAL/PLATELET
Abs Immature Granulocytes: 0.02 10*3/uL (ref 0.00–0.07)
Basophils Absolute: 0.1 10*3/uL (ref 0.0–0.1)
Basophils Relative: 2 %
Eosinophils Absolute: 0.3 10*3/uL (ref 0.0–0.5)
Eosinophils Relative: 5 %
HCT: 33 % — ABNORMAL LOW (ref 39.0–52.0)
Hemoglobin: 10.7 g/dL — ABNORMAL LOW (ref 13.0–17.0)
Immature Granulocytes: 0 %
Lymphocytes Relative: 26 %
Lymphs Abs: 1.8 10*3/uL (ref 0.7–4.0)
MCH: 30.8 pg (ref 26.0–34.0)
MCHC: 32.4 g/dL (ref 30.0–36.0)
MCV: 95.1 fL (ref 80.0–100.0)
Monocytes Absolute: 0.9 10*3/uL (ref 0.1–1.0)
Monocytes Relative: 12 %
Neutro Abs: 3.8 10*3/uL (ref 1.7–7.7)
Neutrophils Relative %: 55 %
Platelets: 156 10*3/uL (ref 150–400)
RBC: 3.47 MIL/uL — ABNORMAL LOW (ref 4.22–5.81)
RDW: 13.2 % (ref 11.5–15.5)
WBC: 6.9 10*3/uL (ref 4.0–10.5)
nRBC: 0 % (ref 0.0–0.2)

## 2018-04-30 LAB — URINALYSIS, ROUTINE W REFLEX MICROSCOPIC
Bacteria, UA: NONE SEEN
Bilirubin Urine: NEGATIVE
Glucose, UA: NEGATIVE mg/dL
Hgb urine dipstick: NEGATIVE
Ketones, ur: NEGATIVE mg/dL
Leukocytes, UA: NEGATIVE
Nitrite: NEGATIVE
Protein, ur: 30 mg/dL — AB
Specific Gravity, Urine: 1.006 (ref 1.005–1.030)
pH: 7 (ref 5.0–8.0)

## 2018-04-30 LAB — GLUCOSE, CAPILLARY: Glucose-Capillary: 113 mg/dL — ABNORMAL HIGH (ref 70–99)

## 2018-04-30 LAB — COMPREHENSIVE METABOLIC PANEL
ALT: 20 U/L (ref 0–44)
AST: 18 U/L (ref 15–41)
Albumin: 3.9 g/dL (ref 3.5–5.0)
Alkaline Phosphatase: 53 U/L (ref 38–126)
Anion gap: 10 (ref 5–15)
BUN: 35 mg/dL — ABNORMAL HIGH (ref 8–23)
CO2: 21 mmol/L — ABNORMAL LOW (ref 22–32)
Calcium: 8.9 mg/dL (ref 8.9–10.3)
Chloride: 104 mmol/L (ref 98–111)
Creatinine, Ser: 2.12 mg/dL — ABNORMAL HIGH (ref 0.61–1.24)
GFR calc Af Amer: 32 mL/min — ABNORMAL LOW (ref >60)
GFR calc non Af Amer: 28 mL/min — ABNORMAL LOW (ref >60)
Glucose, Bld: 107 mg/dL — ABNORMAL HIGH (ref 70–99)
Potassium: 4.6 mmol/L (ref 3.5–5.1)
Sodium: 135 mmol/L (ref 135–145)
Total Bilirubin: 0.5 mg/dL (ref 0.3–1.2)
Total Protein: 6.1 g/dL — ABNORMAL LOW (ref 6.5–8.1)

## 2018-04-30 LAB — PROTIME-INR
INR: 0.99
Prothrombin Time: 13 seconds (ref 11.4–15.2)

## 2018-04-30 LAB — ETHANOL: Alcohol, Ethyl (B): <10 mg/dL (ref <10)

## 2018-04-30 LAB — APTT: aPTT: 20 seconds — ABNORMAL LOW (ref 24–36)

## 2018-04-30 MED ORDER — BRIMONIDINE TARTRATE-TIMOLOL 0.2-0.5 % OP SOLN: 1.0000 [drp] | Freq: Two times a day (BID) | OPHTHALMIC | Status: DC

## 2018-04-30 MED ORDER — FINASTERIDE 5 MG PO TABS
5.0000 mg | ORAL_TABLET | Freq: Every day | ORAL | Status: DC
  Administered 2018-04-30 – 2018-05-02 (×3): 5 mg via ORAL
  Filled 2018-04-30 (×3): qty 1

## 2018-04-30 MED ORDER — STROKE: EARLY STAGES OF RECOVERY BOOK
Freq: Once | Status: AC
  Administered 2018-04-30: 21:00:00
  Filled 2018-04-30: qty 1

## 2018-04-30 MED ORDER — SENNOSIDES-DOCUSATE SODIUM 8.6-50 MG PO TABS: 1.0000 | ORAL_TABLET | Freq: Every evening | ORAL | Status: DC | PRN

## 2018-04-30 MED ORDER — DORZOLAMIDE HCL 2 % OP SOLN
1.0000 [drp] | Freq: Two times a day (BID) | OPHTHALMIC | Status: DC
  Administered 2018-05-01 – 2018-05-02 (×3): 1 [drp] via OPHTHALMIC
  Filled 2018-04-30: qty 10

## 2018-04-30 MED ORDER — ENOXAPARIN SODIUM 30 MG/0.3ML ~~LOC~~ SOLN
30.0000 mg | SUBCUTANEOUS | Status: DC
  Administered 2018-04-30 – 2018-05-01 (×2): 30 mg via SUBCUTANEOUS
  Filled 2018-04-30 (×2): qty 0.3

## 2018-04-30 MED ORDER — ASPIRIN 325 MG PO TABS
325.0000 mg | ORAL_TABLET | Freq: Every day | ORAL | Status: DC
  Administered 2018-04-30: 325 mg via ORAL

## 2018-04-30 MED ORDER — ACETAMINOPHEN 650 MG RE SUPP: 650.0000 mg | RECTAL | Status: DC | PRN

## 2018-04-30 MED ORDER — ATORVASTATIN CALCIUM 80 MG PO TABS: 80.0000 mg | ORAL_TABLET | Freq: Every day | ORAL | Status: DC

## 2018-04-30 MED ORDER — ACETAMINOPHEN 325 MG PO TABS
650.0000 mg | ORAL_TABLET | ORAL | Status: DC | PRN
  Administered 2018-04-30 – 2018-05-02 (×5): 650 mg via ORAL
  Filled 2018-04-30 (×4): qty 2

## 2018-04-30 MED ORDER — BRIMONIDINE TARTRATE 0.2 % OP SOLN
1.0000 [drp] | Freq: Two times a day (BID) | OPHTHALMIC | Status: DC
  Administered 2018-05-01 – 2018-05-02 (×4): 1 [drp] via OPHTHALMIC
  Filled 2018-04-30: qty 5

## 2018-04-30 MED ORDER — NETARSUDIL-LATANOPROST 0.02-0.005 % OP SOLN
1.0000 [drp] | Freq: Every day | OPHTHALMIC | Status: DC
  Administered 2018-05-01: 1 [drp] via OPHTHALMIC

## 2018-04-30 MED ORDER — TIMOLOL MALEATE 0.5 % OP SOLN
1.0000 [drp] | Freq: Two times a day (BID) | OPHTHALMIC | Status: DC
  Administered 2018-05-01 – 2018-05-02 (×4): 1 [drp] via OPHTHALMIC
  Filled 2018-04-30: qty 5

## 2018-04-30 MED ORDER — HYDRALAZINE HCL 20 MG/ML IJ SOLN
10.0000 mg | Freq: Once | INTRAMUSCULAR | Status: AC
  Administered 2018-04-30: 10 mg via INTRAVENOUS
  Filled 2018-04-30: qty 1

## 2018-04-30 MED ORDER — ASPIRIN 325 MG PO TABS
325.0000 mg | ORAL_TABLET | Freq: Every day | ORAL | Status: DC
  Administered 2018-05-01: 325 mg via ORAL
  Filled 2018-04-30 (×2): qty 1

## 2018-04-30 MED ORDER — DORZOLAMIDE HCL-TIMOLOL MAL 2-0.5 % OP SOLN
1.0000 [drp] | Freq: Two times a day (BID) | OPHTHALMIC | Status: DC
  Administered 2018-04-30: 1 [drp] via OPHTHALMIC
  Filled 2018-04-30: qty 10

## 2018-04-30 MED ORDER — ACETAMINOPHEN 160 MG/5ML PO SOLN: 650.0000 mg | ORAL | Status: DC | PRN

## 2018-04-30 MED ORDER — HYDRALAZINE HCL 20 MG/ML IJ SOLN: 10.0000 mg | Freq: Four times a day (QID) | INTRAMUSCULAR | Status: DC | PRN

## 2018-04-30 MED ORDER — CLEVIDIPINE BUTYRATE 0.5 MG/ML IV EMUL: 0.0000 mg/h | INTRAVENOUS | Status: DC

## 2018-04-30 NOTE — H&P (Signed)
History and Physical    Adam [name] JJK:[id number] DOB: 09-06-1930 DOA: 04/30/2018  PCP: Adam Baton, MD Patient coming from: Home  Chief Complaint: Difficulty walking  HPI: Adam [name] is a 85 y.o. male with medical history significant of, hyperlipidemia, GERD, type 2 diabetes, BPH presenting to the hospital for evaluation of difficulty walking.  Patient's daughter states that yesterday around noon patient forgot about a meeting.  At lunchtime, he grabbed a can opener but did not know what to do with it.  States by dinnertime he was back to baseline but was complaining of a headache.  Patient states this morning he went to exercise at 9 AM and after returning back to his apartment he noticed difficulty walking which caused him to fall against the wall even while using a walker.  States he caught himself and did not actually fall on the floor.  No injury to his head or any other injuries sustained from this incident.  States he took 2 regular aspirins at home today. Reports compliance with his home blood pressure medications.  Review of Systems: As per HPI otherwise 10 point review of systems negative.  Past Medical History:  Diagnosis Date  . Acute meniscal tear of knee RIGHT KNEE  . Arthritis   . BPH (benign prostatic hypertrophy)   . Colon polyps    hyperplastic  . Diabetes mellitus type 2, diet-controlled (HCC) AND EXERCISE-- LAST A1C  5.6  . Diverticulosis   . Gait abnormality 11/08/2016  . GERD (gastroesophageal reflux disease)   . Glaucoma   . H/O hiatal hernia   . Heart murmur   . History of esophageal stricture S/P DILATION IN 2007  . Hyperlipidemia   . Hypertension   . IgM monoclonal gammopathy of uncertain significance ASYMPTOMATIC    FOLLOWED BY DR Adam [name]  . Internal hemorrhoids   . Macrocytic anemia DUE TO CHRONIC RENAL INSUFF.  . Nocturia   . Right knee pain     Past Surgical History:  Procedure Laterality Date  . CATARACT EXTRACTION W/ INTRAOCULAR LENS  IMPLANT,  BILATERAL  2012  . HEMORRHOID SURGERY    . HERNIA REPAIR    . KNEE ARTHROSCOPY  12/25/2011   Procedure: ARTHROSCOPY KNEE;  Surgeon: Adam Bastos, MD;  Location: [facility];  Service: Orthopedics;  Laterality: Right;  right knee arthroscopy with medial menisectomy       reports that he has quit smoking. His smoking use included cigarettes. He has a 10.00 pack-year smoking history. He has never used smokeless tobacco. He reports current alcohol use of about 14.0 standard drinks of alcohol per week. He reports that he does not use drugs.  No Known Allergies  Family History  Problem Relation Age of Onset  . Diabetes Brother   . Heart attack Father   . Early death Father   . Hearing loss Father   . Heart disease Father   . Alcohol abuse Sister   . Cancer Sister     Prior to Admission medications   Medication Sig Start Date End Date Taking? Authorizing Provider  acetaminophen (TYLENOL) 500 MG tablet Take 500 mg by mouth every 6 (six) hours as needed for mild pain.    Yes [provider]  amLODipine (NORVASC) 2.5 MG tablet Take 1 tablet (2.5 mg total) by mouth daily. Please call and schedule an office visit for further refills. 1st attempt Patient taking differently: Take 2.5 mg by mouth daily.  01/10/18  Yes Adam Records, MD  atorvastatin (LIPITOR) 40 MG tablet Take 40 mg by mouth daily. 11/09/15  Yes [provider]  brimonidine-timolol (COMBIGAN) 0.2-0.5 % ophthalmic solution Place 1 drop into the left eye 2 (two) times daily.    Yes [provider]  clobetasol cream (TEMOVATE) 6.96 % Apply 1 application topically every evening. 05/03/16  Yes [provider]  cloNIDine (CATAPRES) 0.1 MG tablet Take 0.5 tablets by mouth 2 (two) times daily. 10/19/14  Yes [provider]  DORZOLAMIDE HCL-TIMOLOL MAL OP Place 1 drop into the left eye 2 (two) times daily.    Yes [provider]  finasteride (PROSCAR) 5 MG tablet Take 5 mg  by mouth daily.    Yes [provider]  labetalol (NORMODYNE) 100 MG tablet Take 100 mg by mouth 2 (two) times daily.   Yes [provider]  Multiple Vitamins-Minerals (PRESERVISION AREDS PO) Take 1 tablet by mouth 2 (two) times daily.    Yes [provider]  Netarsudil-Latanoprost (ROCKLATAN) 0.02-0.005 % SOLN Place 1 drop into the left eye at bedtime.    Yes [provider]  nystatin-triamcinolone (MYCOLOG II) cream Apply 1 application topically as needed (skin raah).   Yes [provider]  SODIUM BICARBONATE PO Take 15 g by mouth 2 (two) times daily.    Yes [provider]  ULORIC 40 MG tablet Take 40 mg by mouth 3 (three) times a week. Take 1 tablet by mouth on Tuesday, Friday, and Sunday   Yes [provider]  vitamin C (ASCORBIC ACID) 500 MG tablet Take 500 mg by mouth daily.   Yes [provider]  Vitamin D, Ergocalciferol, (DRISDOL) 1.25 MG (50000 UT) CAPS capsule Take 1 capsule (50,000 Units total) by mouth every 7 (seven) days. Patient taking differently: Take 50,000 Units by mouth every Tuesday.  04/21/18  Yes Adam Pulley, DO    Physical Exam: Vitals:   04/30/18 1800 04/30/18 1809 04/30/18 1830 04/30/18 1900  BP: (!) 232/76  (!) 180/76 (!) 200/76  Pulse: (!) 58  67 68  Resp: 14  13 11   Temp:      TempSrc:      SpO2: 98%  97% 96%  Weight:  68.5 kg    Height:  5\' 3"  (1.6 m)      Physical Exam  Constitutional: He is oriented to person, place, and time. He appears well-developed and well-nourished. No distress.  Eating a sandwich  HENT:  Head: Normocephalic.  Mouth/Throat: Oropharynx is clear and moist.  Eyes: Right eye exhibits no discharge. Left eye exhibits no discharge.  Neck: Neck supple.  Cardiovascular: Normal rate, regular rhythm and intact distal pulses.  Murmur heard. Pulmonary/Chest: Breath sounds normal. No respiratory distress. He has no wheezes. He has no rales.  Abdominal: Soft. Bowel  sounds are normal. He exhibits no distension. There is no abdominal tenderness. There is no guarding.  Musculoskeletal:        General: No edema.  Neurological: He is alert and oriented to person, place, and time. No cranial nerve deficit.  Speech fluent, tongue midline, no facial droop. Strength 5 out of 5 in bilateral upper and lower extremities.  Sensation to light touch intact throughout.  Skin: Skin is warm and dry. He is not diaphoretic.  Psychiatric: He has a normal mood and affect. His behavior is normal.     Labs on Admission: I have personally reviewed following labs and imaging studies  CBC: Recent Labs  Lab 04/30/18 1558  WBC 6.9  NEUTROABS 3.8  HGB 10.7*  HCT 33.0*  MCV 95.1  PLT 932   Basic Metabolic Panel: Recent Labs  Lab 04/30/18 1445  NA 135  K 4.6  CL 104  CO2 21*  GLUCOSE 107*  BUN 35*  CREATININE 2.12*  CALCIUM 8.9   GFR: Estimated Creatinine Clearance: 22.2 mL/min (A) (by C-G formula based on SCr of 2.12 mg/dL (H)). Liver Function Tests: Recent Labs  Lab 04/30/18 1445  AST 18  ALT 20  ALKPHOS 53  BILITOT 0.5  PROT 6.1*  ALBUMIN 3.9   No results for input(s): LIPASE, AMYLASE in the last 168 hours. No results for input(s): AMMONIA in the last 168 hours. Coagulation Profile: Recent Labs  Lab 04/30/18 1445  INR 0.99   Cardiac Enzymes: No results for input(s): CKTOTAL, CKMB, CKMBINDEX, TROPONINI in the last 168 hours. BNP (last 3 results) No results for input(s): PROBNP in the last 8760 hours. HbA1C: No results for input(s): HGBA1C in the last 72 hours. CBG: No results for input(s): GLUCAP in the last 168 hours. Lipid Profile: No results for input(s): CHOL, HDL, LDLCALC, TRIG, CHOLHDL, LDLDIRECT in the last 72 hours. Thyroid Function Tests: No results for input(s): TSH, T4TOTAL, FREET4, T3FREE, THYROIDAB in the last 72 hours. Anemia Panel: No results for input(s): VITAMINB12, FOLATE, FERRITIN, TIBC, IRON, RETICCTPCT in the last  72 hours. Urine analysis:    Component Value Date/Time   COLORURINE STRAW (A) 04/30/2018 1435   APPEARANCEUR CLEAR 04/30/2018 1435   LABSPEC 1.006 04/30/2018 1435   PHURINE 7.0 04/30/2018 1435   GLUCOSEU NEGATIVE 04/30/2018 1435   HGBUR NEGATIVE 04/30/2018 [place] 04/30/2018 [place] 04/30/2018 1435   PROTEINUR 30 (A) 04/30/2018 1435   NITRITE NEGATIVE 04/30/2018 1435   LEUKOCYTESUR NEGATIVE 04/30/2018 1435    Radiological Exams on Admission: Ct Head Wo Contrast  Result Date: 04/30/2018 CLINICAL DATA:  TIA. Transient speech disturbance and left leg weakness. EXAM: CT HEAD WITHOUT CONTRAST TECHNIQUE: Contiguous axial images were obtained from the base of the skull through the vertex without intravenous contrast. COMPARISON:  06/30/2016 brain MRI and 06/28/2016 CT FINDINGS: Brain: There is no evidence of acute infarct, intracranial hemorrhage, mass, midline shift, or extra-axial fluid collection. Moderate ventriculomegaly is unchanged and may represent central predominant cerebral atrophy or possibly normal pressure hydrocephalus in the appropriate clinical setting. A small chronic right cerebellar infarct is unchanged. Periventricular white matter hypodensities are nonspecific but compatible with mild chronic small vessel ischemic disease. Vascular: Calcified atherosclerosis at the skull base. No hyperdense vessel. Skull: No fracture or focal osseous lesion. Sinuses/Orbits: Paranasal sinuses and mastoid air cells are clear. Bilateral cataract extraction. Other: None. IMPRESSION: 1. No evidence of acute intracranial abnormality. 2. Mild chronic small vessel ischemic disease and unchanged ventriculomegaly. Electronically Signed   By: Logan Bores M.D.   On: 04/30/2018 17:45    EKG: Independently reviewed.  Sinus rhythm, RBBB, LAFB.  Assessment/Plan Principal Problem:   Apraxia Active Problems:   Confusion   HTN (hypertension)   HLD (hyperlipidemia)    Type 2 diabetes mellitus (HCC)   BPH (benign prostatic hyperplasia)   Confusion/apraxia -Blood pressure significantly elevated. CT head negative for acute finding.  No focal neuro deficit appreciated on exam.  Patient was seen by neurology and his presentation is thought to be secondary to hypertensive encephalopathy versus stroke versus TIA. -MRI of the brain without contrast -MRA Head and neck  -Transthoracic Echo  -ASA 325mg  daily -Atorvastatin 80 mg daily -  BP goal: permissive HTN upto220/120 mmHg -HBAIC and Lipid profile -Telemetry monitoring -Frequent neuro checks -PT/OT/SLP -Neurology following, appreciate recs  Hypertension Blood pressure significantly elevated in the ED, up to 238/84.  Patient received a dose of IV hydralazine 10 mg.  Most recent blood pressure 200/76. -Allow permissive hypertension up to 220/120 in the setting of suspected stroke -IV hydralazine PRN for SBP >220 or DBP >120  Type 2 diabetes Currently diet controlled and not on any medications.  Random blood glucose 107. -Check A1c -CBG monitoring  Hyperlipidemia -Increase Lipitor dose to 80 mg daily per neurology recommendation  BPH -Continue home finasteride  DVT prophylaxis: Lovenox (renally dosed) Code Status: Patient wishes to be DNR. Family Communication: Daughter and son-in-law at bedside. Disposition Plan: Anticipate discharge in 1 to 2 days. Consults called: Neurology Admission status: Observation, telemetry   Shela Leff MD [facility] Pager [phone]  If 7PM-7AM, please contact night-coverage [email or web address] Password [facility]  04/30/2018, 8:03 PM

## 2018-04-30 NOTE — ED Provider Notes (Signed)
[facility] EMERGENCY DEPARTMENT Provider Note   CSN: [id number] Arrival date & time: 04/30/18  1418     History   Chief Complaint Chief Complaint  Patient presents with  . Stroke Symptoms    HPI Nathanial Arrighi is a 85 y.o. male.  Patient with history of diabetes, hypertension -- presents the emergency department with possible TIA.  Patient had symptoms today around 1:30pm which lasted for a few minutes.  He states that he had speech difficulty and difficulty walking stating his left leg leg felt like it was weak.  He states that he was leaning to the side.  No arm symptoms.  No vertigo.  Symptoms then improved.  In addition, yesterday patient had an episode where he went to use a can opener and cannot remember how it worked.  Typically would have no difficulty with this activity.  Again, symptoms resolved after a brief time.  He has had an ongoing headache for several days.  No fever or neck pain.  Daughter notes that her mother, patient's wife, passed away last month.  No history of similar symptoms.  The onset of this condition was acute. The course is resolved. Aggravating factors: none. Alleviating factors: none.      Past Medical History:  Diagnosis Date  . Acute meniscal tear of knee RIGHT KNEE  . Arthritis   . BPH (benign prostatic hypertrophy)   . Colon polyps    hyperplastic  . Diabetes mellitus type 2, diet-controlled (HCC) AND EXERCISE-- LAST A1C  5.6  . Diverticulosis   . Gait abnormality 11/08/2016  . GERD (gastroesophageal reflux disease)   . Glaucoma   . H/O hiatal hernia   . Heart murmur   . History of esophageal stricture S/P DILATION IN 2007  . Hyperlipidemia   . Hypertension   . IgM monoclonal gammopathy of uncertain significance ASYMPTOMATIC    FOLLOWED BY DR Alen Blew  . Internal hemorrhoids   . Macrocytic anemia DUE TO CHRONIC RENAL INSUFF.  . Nocturia   . Right knee pain     Patient Active Problem List   Diagnosis Date Noted  .  Degenerative disc disease, lumbar 04/21/2018  . Pronation deformity of ankle, acquired 04/21/2018  . Gait abnormality 11/08/2016  . Dizziness and giddiness 06/21/2016  . Meniscus, lateral, anterior horn derangement 12/25/2011  . Meniscus, medial, bucket handle tear, old 12/25/2011  . Osteoarthritis of right knee 12/25/2011    Past Surgical History:  Procedure Laterality Date  . CATARACT EXTRACTION W/ INTRAOCULAR LENS  IMPLANT, BILATERAL  2012  . HEMORRHOID SURGERY    . HERNIA REPAIR    . KNEE ARTHROSCOPY  12/25/2011   Procedure: ARTHROSCOPY KNEE;  Surgeon: Tobi Bastos, MD;  Location: [facility];  Service: Orthopedics;  Laterality: Right;  right knee arthroscopy with medial menisectomy          Home Medications    Prior to Admission medications   Medication Sig Start Date End Date Taking? Authorizing Provider  acetaminophen (TYLENOL) 500 MG tablet Take 500 mg by mouth every 6 (six) hours as needed.    [provider]  amLODipine (NORVASC) 2.5 MG tablet Take 1 tablet (2.5 mg total) by mouth daily. Please call and schedule an office visit for further refills. 1st attempt 01/10/18   Fay Records, MD  atorvastatin (LIPITOR) 40 MG tablet Take 40 mg by mouth daily. 11/09/15   [provider]  brimonidine-timolol (COMBIGAN) 0.2-0.5 % ophthalmic solution Place 1 drop into  both eyes every 12 (twelve) hours.    [provider]  clobetasol cream (TEMOVATE) 2.59 % Apply 1 application topically every evening. 05/03/16   [provider]  cloNIDine (CATAPRES) 0.1 MG tablet Take 0.5 tablets by mouth 2 (two) times daily. 10/19/14   [provider]  DORZOLAMIDE HCL-TIMOLOL MAL OP Apply to eye.    [provider]  finasteride (PROSCAR) 5 MG tablet  10/11/14   [provider]  labetalol (NORMODYNE) 100 MG tablet Take 100 mg by mouth 2 (two) times daily.    [provider]  Multiple Vitamins-Minerals (PRESERVISION  AREDS PO) Take by mouth 1 day or 1 dose.    [provider]  Netarsudil-Latanoprost (ROCKLATAN) 0.02-0.005 % SOLN Apply to eye.    [provider]  nystatin ointment (MYCOSTATIN) Apply 1 application topically 2 (two) times daily.    [provider]  sodium bicarbonate 325 MG tablet Take 325 mg by mouth 4 (four) times daily.    [provider]  ULORIC 40 MG tablet Take 40 mg by mouth 3 (three) times a week. Theora Master, sun 11/17/15   [provider]  vitamin C (ASCORBIC ACID) 500 MG tablet Take 500 mg by mouth daily.    [provider]  Vitamin D, Ergocalciferol, (DRISDOL) 1.25 MG (50000 UT) CAPS capsule Take 1 capsule (50,000 Units total) by mouth every 7 (seven) days. 04/21/18   Lyndal Pulley, DO    Family History Family History  Problem Relation Age of Onset  . Diabetes Brother   . Heart attack Father   . Early death Father   . Hearing loss Father   . Heart disease Father   . Alcohol abuse Sister   . Cancer Sister     Social History Social History   Tobacco Use  . Smoking status: Former Smoker    Packs/day: 1.00    Years: 10.00    Pack years: 10.00    Types: Cigarettes  . Smokeless tobacco: Never Used  Substance Use Topics  . Alcohol use: Yes    Alcohol/week: 14.0 standard drinks    Types: 14 Standard drinks or equivalent per week  . Drug use: No     Allergies   Patient has no known allergies.   Review of Systems Review of Systems  Constitutional: Negative for fever.  HENT: Negative for congestion, dental problem, rhinorrhea and sinus pressure.   Eyes: Negative for photophobia, discharge, redness and visual disturbance.  Respiratory: Negative for shortness of breath.   Cardiovascular: Negative for chest pain.  Gastrointestinal: Negative for nausea and vomiting.  Musculoskeletal: Positive for gait problem. Negative for neck pain and neck stiffness.  Skin: Negative for rash.  Neurological: Positive for speech  difficulty, weakness and headaches. Negative for syncope, facial asymmetry, light-headedness and numbness.  Psychiatric/Behavioral: Positive for confusion.     Physical Exam Updated Vital Signs BP (!) 193/172 (BP Location: Right Arm)   Pulse 62   Temp 97.7 F (36.5 C) (Oral)   Resp 16   SpO2 99%   Physical Exam Vitals signs and nursing note reviewed.  Constitutional:      Appearance: He is well-developed.  HENT:     Head: Normocephalic and atraumatic.  Eyes:     General:        Right eye: No discharge.        Left eye: No discharge.     Conjunctiva/sclera: Conjunctivae normal.  Neck:     Musculoskeletal: Normal range of motion  and neck supple.  Cardiovascular:     Rate and Rhythm: Normal rate and regular rhythm.     Heart sounds: Normal heart sounds.  Pulmonary:     Effort: Pulmonary effort is normal.     Breath sounds: Normal breath sounds.  Abdominal:     Palpations: Abdomen is soft.     Tenderness: There is no abdominal tenderness.  Skin:    General: Skin is warm and dry.  Neurological:     Mental Status: He is alert.     GCS: GCS eye subscore is 4. GCS verbal subscore is 5. GCS motor subscore is 6.     Cranial Nerves: No dysarthria.     Sensory: No sensory deficit.     Motor: Motor function is intact. No weakness.     Coordination: Coordination is intact. Romberg sign negative. Coordination normal.      ED Treatments / Results  Labs (all labs ordered are listed, but only abnormal results are displayed) Labs Reviewed  APTT - Abnormal; Notable for the following components:      Result Value   aPTT 20 (*)    All other components within normal limits  COMPREHENSIVE METABOLIC PANEL - Abnormal; Notable for the following components:   CO2 21 (*)    Glucose, Bld 107 (*)    BUN 35 (*)    Creatinine, Ser 2.12 (*)    Total Protein 6.1 (*)    GFR calc non Af Amer 28 (*)    GFR calc Af Amer 32 (*)    All other components within normal limits  URINALYSIS,  ROUTINE W REFLEX MICROSCOPIC - Abnormal; Notable for the following components:   Color, Urine STRAW (*)    Protein, ur 30 (*)    All other components within normal limits  PROTIME-INR  ETHANOL  CBC WITH DIFFERENTIAL/PLATELET  I-STAT TROPONIN, ED    EKG None  Radiology No results found.  Procedures Procedures (including critical care time)  Medications Ordered in ED Medications - No data to display   Initial Impression / Assessment and Plan / ED Course  I have reviewed the triage vital signs and the nursing notes.  Pertinent labs & imaging results that were available during my care of the patient were reviewed by me and considered in my medical decision making (see chart for details).     Patient seen and examined. Work-up initiated. Medications ordered.   Vital signs reviewed and are as follows: BP (!) 193/172 (BP Location: Right Arm)   Pulse 62   Temp 97.7 F (36.5 C) (Oral)   Resp 16   SpO2 99%   4:24 PM patient discussed with and seen by Dr. Ellender Hose.  Elevated BP noted.  Dr. Rory Percy aware of patient -- will follow.   Handoff to [place] PA-C at shift change. Need CT imaging to ensure no bleed. Will need admit for TIA work-up.   Final Clinical Impressions(s) / ED Diagnoses   Final diagnoses:  TIA (transient ischemic attack)   Pending completion of work-up -- will need admit for TIA.   ED Discharge Orders    None       Carlisle Cater, Hershal Coria 04/30/18 1626    Duffy Bruce, MD 05/01/18 [phone]

## 2018-04-30 NOTE — Progress Notes (Signed)
Called daughter Sula Soda and asked her to bring eye drop from home (Netarsudil-Latanoprost) that we do not carry in our pharmacy, she stated she would bring it in the morning.

## 2018-04-30 NOTE — Consult Note (Addendum)
Neurology Consultation  Reason for Consult: TIA Referring Physician: Dr. Ellender Hose  CC: 2 episodes of confusion  History is obtained from: Daughter  HPI: Adam [name] is a 85 y.o. male with history of hypertension, hyperlipidemia, heart murmur, glaucoma, hiatal hernia, diverticulitis, diabetes.  Patient's daughter ran into her father yesterday proximally around 1330 p.m. and he had told her that he was having difficulty using a can opener that lasted for an unknown short period of time.  And then dissipated.  When she had seen him she did note that he was having difficulty walking and he seems slower in thought process.  He was also noted to be leaning to the left side.  No other symptoms were noted.  The symptoms improved.  Today he also noted again he was having the same symptoms of difficulty thinking and difficulty doing activities.  She is unsure of how long this happened however he states that it was only a brief time.  Per notes he has had an ongoing headache for several days.  Of note currently his blood pressure significantly high at 255/84.  He does take labetalol and clonidine however patient's daughter states that he has taken both of his medications this morning and has not missed clonidine.  LKW: No clear last seen normal however patient still feels as though he is slow in thought process tpa given?: no, no clear localizing or lateralizing symptoms Premorbid modified Rankin scale (mRS): 0 NIH stroke scale of 0   ROS: A 14 point ROS was performed and is negative except as noted in the HPI.  Past Medical History:  Diagnosis Date  . Acute meniscal tear of knee RIGHT KNEE  . Arthritis   . BPH (benign prostatic hypertrophy)   . Colon polyps    hyperplastic  . Diabetes mellitus type 2, diet-controlled (HCC) AND EXERCISE-- LAST A1C  5.6  . Diverticulosis   . Gait abnormality 11/08/2016  . GERD (gastroesophageal reflux disease)   . Glaucoma   . H/O hiatal hernia   . Heart murmur    . History of esophageal stricture S/P DILATION IN 2007  . Hyperlipidemia   . Hypertension   . IgM monoclonal gammopathy of uncertain significance ASYMPTOMATIC    FOLLOWED BY DR Alen Blew  . Internal hemorrhoids   . Macrocytic anemia DUE TO CHRONIC RENAL INSUFF.  . Nocturia   . Right knee pain      Family History  Problem Relation Age of Onset  . Diabetes Brother   . Heart attack Father   . Early death Father   . Hearing loss Father   . Heart disease Father   . Alcohol abuse Sister   . Cancer Sister    Social History:   reports that he has quit smoking. His smoking use included cigarettes. He has a 10.00 pack-year smoking history. He has never used smokeless tobacco. He reports current alcohol use of about 14.0 standard drinks of alcohol per week. He reports that he does not use drugs.  Medications No current facility-administered medications for this encounter.   Current Outpatient Medications:  .  acetaminophen (TYLENOL) 500 MG tablet, Take 500 mg by mouth every 6 (six) hours as needed for mild pain. , Disp: , Rfl:  .  amLODipine (NORVASC) 2.5 MG tablet, Take 1 tablet (2.5 mg total) by mouth daily. Please call and schedule an office visit for further refills. 1st attempt (Patient taking differently: Take 2.5 mg by mouth daily. ), Disp: 30 tablet, Rfl: 1 .  atorvastatin (LIPITOR) 40 MG tablet, Take 40 mg by mouth daily., Disp: , Rfl:  .  brimonidine-timolol (COMBIGAN) 0.2-0.5 % ophthalmic solution, Place 1 drop into the left eye 2 (two) times daily. , Disp: , Rfl:  .  clobetasol cream (TEMOVATE) 0.26 %, Apply 1 application topically every evening., Disp: , Rfl:  .  cloNIDine (CATAPRES) 0.1 MG tablet, Take 0.5 tablets by mouth 2 (two) times daily., Disp: , Rfl:  .  DORZOLAMIDE HCL-TIMOLOL MAL OP, Place 1 drop into the left eye 2 (two) times daily. , Disp: , Rfl:  .  finasteride (PROSCAR) 5 MG tablet, Take 5 mg by mouth daily. , Disp: , Rfl:  .  labetalol (NORMODYNE) 100 MG tablet,  Take 100 mg by mouth 2 (two) times daily., Disp: , Rfl:  .  Multiple Vitamins-Minerals (PRESERVISION AREDS PO), Take 1 tablet by mouth 2 (two) times daily. , Disp: , Rfl:  .  Netarsudil-Latanoprost (ROCKLATAN) 0.02-0.005 % SOLN, Place 1 drop into the left eye at bedtime. , Disp: , Rfl:  .  nystatin-triamcinolone (MYCOLOG II) cream, Apply 1 application topically as needed (skin raah)., Disp: , Rfl:  .  SODIUM BICARBONATE PO, Take 15 g by mouth 2 (two) times daily. , Disp: , Rfl:  .  ULORIC 40 MG tablet, Take 40 mg by mouth 3 (three) times a week. Take 1 tablet by mouth on Tuesday, Friday, and Sunday, Disp: , Rfl:  .  vitamin C (ASCORBIC ACID) 500 MG tablet, Take 500 mg by mouth daily., Disp: , Rfl:  .  Vitamin D, Ergocalciferol, (DRISDOL) 1.25 MG (50000 UT) CAPS capsule, Take 1 capsule (50,000 Units total) by mouth every 7 (seven) days. (Patient taking differently: Take 50,000 Units by mouth every Tuesday. ), Disp: 8 capsule, Rfl: 0   Exam: Current vital signs: BP (!) 238/84   Pulse 60   Temp 97.7 F (36.5 C) (Oral)   Resp 14   SpO2 99%  Vital signs in last 24 hours: Temp:  [97.7 F (36.5 C)] 97.7 F (36.5 C) (01/29 1427) Pulse Rate:  [58-62] 60 (01/29 1647) Resp:  [9-16] 14 (01/29 1647) BP: (193-238)/(79-172) 238/84 (01/29 1647) SpO2:  [99 %-100 %] 99 % (01/29 1647)  Physical Exam  Constitutional: Appears well-developed and well-nourished.  Psych: Affect appropriate to situation Eyes: No scleral injection HENT: No OP obstrucion Head: Normocephalic.  Cardiovascular: Normal rate and regular rhythm.  Respiratory: Effort normal, non-labored breathing GI: Soft.  No distension. There is no tenderness.  Skin: WDI  Neuro: Mental Status: Patient is awake, alert, oriented to person, place, month, year, and situation with bradyphrenia Patient is able to give a clear and coherent history. No signs of aphasia or neglect Cranial Nerves: II: Visual Fields are full. III,IV, VI: EOMI  without ptosis or diploplia.  Pupils are equal, round, and reactive to light.   V: Facial sensation is symmetric to temperature VII: Facial movement is symmetric.  VIII: hearing is intact to voice X: Uvula elevates symmetrically XI: Shoulder shrug is symmetric. XII: tongue is midline without atrophy or fasciculations.  Motor: Tone is normal. Bulk is normal. 5/5 strength was present in all four extremities.  Sensory: Sensation is symmetric to light touch and temperature in the arms and legs. Deep Tendon Reflexes: 2+ and symmetric in the biceps and patellae.  Plantars: Toes are downgoing bilaterally.  Cerebellar: FNF and HKS are intact bilaterally  Labs I have reviewed labs in epic and the results pertinent to this consultation are:  CBC    Component Value Date/Time   WBC 6.9 04/30/2018 1558   RBC 3.47 (L) 04/30/2018 1558   HGB 10.7 (L) 04/30/2018 1558   HGB 9.8 (L) 02/01/2012 0920   HCT 33.0 (L) 04/30/2018 1558   HCT 28.0 (L) 02/01/2012 0920   PLT 156 04/30/2018 1558   PLT 160 02/01/2012 0920   MCV 95.1 04/30/2018 1558   MCV 102.0 (H) 02/01/2012 0920   MCH 30.8 04/30/2018 1558   MCHC 32.4 04/30/2018 1558   RDW 13.2 04/30/2018 1558   RDW 14.4 02/01/2012 0920   LYMPHSABS 1.8 04/30/2018 1558   LYMPHSABS 0.9 02/01/2012 0920   MONOABS 0.9 04/30/2018 1558   MONOABS 0.4 02/01/2012 0920   EOSABS 0.3 04/30/2018 1558   EOSABS 0.2 02/01/2012 0920   BASOSABS 0.1 04/30/2018 1558   BASOSABS 0.1 02/01/2012 0920    CMP     Component Value Date/Time   NA 135 04/30/2018 1445   NA 138 02/01/2012 0920   K 4.6 04/30/2018 1445   K 4.6 02/01/2012 0920   CL 104 04/30/2018 1445   CL 113 (H) 02/01/2012 0920   CO2 21 (L) 04/30/2018 1445   CO2 21 (L) 02/01/2012 0920   GLUCOSE 107 (H) 04/30/2018 1445   GLUCOSE 130 (H) 02/01/2012 0920   BUN 35 (H) 04/30/2018 1445   BUN 28.0 (H) 02/01/2012 0920   CREATININE 2.12 (H) 04/30/2018 1445   CREATININE 1.64 (H) 12/15/2015 1025   CREATININE  1.4 (H) 02/01/2012 0920   CALCIUM 8.9 04/30/2018 1445   CALCIUM 9.5 02/01/2012 0920   PROT 6.1 (L) 04/30/2018 1445   PROT 6.4 02/01/2012 0920   ALBUMIN 3.9 04/30/2018 1445   ALBUMIN 3.8 02/01/2012 0920   AST 18 04/30/2018 1445   AST 39 (H) 02/01/2012 0920   ALT 20 04/30/2018 1445   ALT 56 (H) 02/01/2012 0920   ALKPHOS 53 04/30/2018 1445   ALKPHOS 68 02/01/2012 0920   BILITOT 0.5 04/30/2018 1445   BILITOT 0.61 02/01/2012 0920   GFRNONAA 28 (L) 04/30/2018 1445   GFRAA 32 (L) 04/30/2018 1445    Lipid Panel  No results found for: CHOL, TRIG, HDL, CHOLHDL, VLDL, LDLCALC, LDLDIRECT   Imaging I have reviewed the images obtained:  CT-scan of the brain--pending  MRI examination of the brain-pending  Etta Quill PA-C [facility] [phone]  M-F  (9:00 am- 5:00 PM)  04/30/2018, 5:11 PM   ATTENDING ADDENDUM Seen and examined. Agree with possible HTN urgency vs. Stroke/TIA work up Reviewed [facility] personally - no acute changes. +atrophy and +chronic WM disease  Assessment:  85 year old male with 2 episodes of either confusion/apraxia in the setting of hypertensive urgency.  At this point is unclear if it is hypertensive encephalopathy versus stroke versus TIA.   Impression: -HTN Urgency v. Stroke/TIA  Recommend # MRI of the brain without contrast #MRA Head and neck  #Transthoracic Echo,  # Start patient on ASA 325mg  daily, #Start or continue Atorvastatin 80 mg # BP goal: permissive HTN upto 220/120 mmHg # HBAIC and Lipid profile # Telemetry monitoring # Frequent neuro checks # NPO until passes stroke swallow screen # please page stroke NP  Or  PA  Or MD from 8am -4 pm  as this patient from this time will be  followed by the stroke.   You can look them up on [email or web address]  Password [email or web address]   -- Amie Portland, MD [facility] Pager: [phone] If 7pm to 7am, please call on call as listed on  AMION.

## 2018-04-30 NOTE — ED Triage Notes (Signed)
Pt reports around 130pm today he had an acute episode of aphasia and difficulty walking, lasting a couple minutes. Pt states he fell into the wall to catch himself, no injuries or pain reported. Pt ambulatory at this time with steady gait, alert and oriented. EDP at bedside

## 2018-04-30 NOTE — ED Notes (Signed)
ED TO INPATIENT HANDOFF REPORT  Name/Age/Gender Adam [name] 85 y.o. male  Code Status   Home/SNF/Other Home  Chief Complaint Possible TIA  Level of Care/Admitting Diagnosis ED Disposition    ED Disposition Condition [facility]: [place] [100100]  Level of Care: Medical Telemetry [104]  I expect the patient will be discharged within 24 hours: No (not a candidate for 5C-Observation unit)  Diagnosis: Stroke-like symptoms [280034]  Admitting Physician: Shela Leff [[id number]]  Attending Physician: Shela Leff [[id number]]  PT Class (Do Not Modify): Observation [104]  PT Acc Code (Do Not Modify): Observation [10022]       Medical History Past Medical History:  Diagnosis Date  . Acute meniscal tear of knee RIGHT KNEE  . Arthritis   . BPH (benign prostatic hypertrophy)   . Colon polyps    hyperplastic  . Diabetes mellitus type 2, diet-controlled (HCC) AND EXERCISE-- LAST A1C  5.6  . Diverticulosis   . Gait abnormality 11/08/2016  . GERD (gastroesophageal reflux disease)   . Glaucoma   . H/O hiatal hernia   . Heart murmur   . History of esophageal stricture S/P DILATION IN 2007  . Hyperlipidemia   . Hypertension   . IgM monoclonal gammopathy of uncertain significance ASYMPTOMATIC    FOLLOWED BY DR Alen Blew  . Internal hemorrhoids   . Macrocytic anemia DUE TO CHRONIC RENAL INSUFF.  . Nocturia   . Right knee pain     Allergies No Known Allergies  IV Location/Drains/Wounds Patient Lines/Drains/Airways Status   Active Line/Drains/Airways    Name:   Placement date:   Placement time:   Site:   Days:   Peripheral IV 04/30/18 Left Wrist   04/30/18    1757    Wrist   less than 1          Labs/Imaging Results for orders placed or performed during the hospital encounter of 04/30/18 (from the past 48 hour(s))  Urinalysis, Routine w reflex microscopic     Status: Abnormal   Collection Time: 04/30/18  2:35 PM  Result  Value Ref Range   Color, Urine STRAW (A) YELLOW   APPearance CLEAR CLEAR   Specific Gravity, Urine 1.006 1.005 - 1.030   pH 7.0 5.0 - 8.0   Glucose, UA NEGATIVE NEGATIVE mg/dL   Hgb urine dipstick NEGATIVE NEGATIVE   Bilirubin Urine NEGATIVE NEGATIVE   Ketones, ur NEGATIVE NEGATIVE mg/dL   Protein, ur 30 (A) NEGATIVE mg/dL   Nitrite NEGATIVE NEGATIVE   Leukocytes, UA NEGATIVE NEGATIVE   RBC / HPF 0-5 0 - 5 RBC/hpf   Bacteria, UA NONE SEEN NONE SEEN    Comment: Performed at [facility], [address], [place], [place] [address]  Protime-INR     Status: None   Collection Time: 04/30/18  2:45 PM  Result Value Ref Range   Prothrombin Time 13.0 11.4 - 15.2 seconds   INR 0.99     Comment: Performed at [facility] [address] [place], [place] [address]  APTT     Status: Abnormal   Collection Time: 04/30/18  2:45 PM  Result Value Ref Range   aPTT 20 (L) 24 - 36 seconds    Comment: Performed at [facility] [address], [place],  [address]  Comprehensive metabolic panel     Status: Abnormal   Collection Time: 04/30/18  2:45 PM  Result Value Ref Range   Sodium 135 135 -  145 mmol/L   Potassium 4.6 3.5 - 5.1 mmol/L   Chloride 104 98 - 111 mmol/L   CO2 21 (L) 22 - 32 mmol/L   Glucose, Bld 107 (H) 70 - 99 mg/dL   BUN 35 (H) 8 - 23 mg/dL   Creatinine, Ser 2.12 (H) 0.61 - 1.24 mg/dL   Calcium 8.9 8.9 - 10.3 mg/dL   Total Protein 6.1 (L) 6.5 - 8.1 g/dL   Albumin 3.9 3.5 - 5.0 g/dL   AST 18 15 - 41 U/L   ALT 20 0 - 44 U/L   Alkaline Phosphatase 53 38 - 126 U/L   Total Bilirubin 0.5 0.3 - 1.2 mg/dL   GFR calc non Af Amer 28 (L) >60 mL/min   GFR calc Af Amer 32 (L) >60 mL/min   Anion gap 10 5 - 15    Comment: Performed at [facility] [address] [place], [place] [address]  Ethanol     Status: None   Collection Time: 04/30/18  2:45 PM  Result Value Ref Range   Alcohol, Ethyl (B) <10 <10 mg/dL    Comment: (NOTE) Lowest detectable limit  for serum alcohol is 10 mg/dL. For medical purposes only. Performed at [facility], [place] [address] [place], [place] [address]   CBC with Differential/Platelet     Status: Abnormal   Collection Time: 04/30/18  3:58 PM  Result Value Ref Range   WBC 6.9 4.0 - 10.5 K/uL   RBC 3.47 (L) 4.22 - 5.81 MIL/uL   Hemoglobin 10.7 (L) 13.0 - 17.0 g/dL   HCT 33.0 (L) 39.0 - 52.0 %   MCV 95.1 80.0 - 100.0 fL   MCH 30.8 26.0 - 34.0 pg   MCHC 32.4 30.0 - 36.0 g/dL   RDW 13.2 11.5 - 15.5 %   Platelets 156 150 - 400 K/uL   nRBC 0.0 0.0 - 0.2 %   Neutrophils Relative % 55 %   Neutro Abs 3.8 1.7 - 7.7 K/uL   Lymphocytes Relative 26 %   Lymphs Abs 1.8 0.7 - 4.0 K/uL   Monocytes Relative 12 %   Monocytes Absolute 0.9 0.1 - 1.0 K/uL   Eosinophils Relative 5 %   Eosinophils Absolute 0.3 0.0 - 0.5 K/uL   Basophils Relative 2 %   Basophils Absolute 0.1 0.0 - 0.1 K/uL   Immature Granulocytes 0 %   Abs Immature Granulocytes 0.02 0.00 - 0.07 K/uL    Comment: Performed at [facility] [address] [place], [place] [address]   Ct Head Wo Contrast  Result Date: 04/30/2018 CLINICAL DATA:  TIA. Transient speech disturbance and left leg weakness. EXAM: CT HEAD WITHOUT CONTRAST TECHNIQUE: Contiguous axial images were obtained from the base of the skull through the vertex without intravenous contrast. COMPARISON:  06/30/2016 brain MRI and 06/28/2016 CT FINDINGS: Brain: There is no evidence of acute infarct, intracranial hemorrhage, mass, midline shift, or extra-axial fluid collection. Moderate ventriculomegaly is unchanged and may represent central predominant cerebral atrophy or possibly normal pressure hydrocephalus in the appropriate clinical setting. A small chronic right cerebellar infarct is unchanged. Periventricular white matter hypodensities are nonspecific but compatible with mild chronic small vessel ischemic disease. Vascular: Calcified atherosclerosis at the skull base. No hyperdense  vessel. Skull: No fracture or focal osseous lesion. Sinuses/Orbits: Paranasal sinuses and mastoid air cells are clear. Bilateral cataract extraction. Other: None. IMPRESSION: 1. No evidence of acute intracranial abnormality. 2. Mild chronic small vessel ischemic disease  and unchanged ventriculomegaly. Electronically Signed   By: Logan Bores M.D.   On: 04/30/2018 17:45   None  Pending Labs Unresulted Labs (From admission, onward)   None      Vitals/Pain Today's Vitals   04/30/18 1647 04/30/18 1800 04/30/18 1809 04/30/18 1830  BP: (!) 238/84 (!) 232/76  (!) 180/76  Pulse: 60 (!) 58  67  Resp: 14 14  13   Temp:      TempSrc:      SpO2: 99% 98%  97%  Weight:   68.5 kg   Height:   5\' 3"  (1.6 m)     Isolation Precautions No active isolations  Medications Medications  hydrALAZINE (APRESOLINE) injection 10 mg (10 mg Intravenous Given 04/30/18 1803)    Mobility walks

## 2018-04-30 NOTE — ED Notes (Signed)
Patient ambulated to restroom with steady gait. Neuro at bedside and aware of patients blood pressure.

## 2018-05-01 ENCOUNTER — Observation Stay (HOSPITAL_BASED_OUTPATIENT_CLINIC_OR_DEPARTMENT_OTHER): Payer: Medicare Other

## 2018-05-01 DIAGNOSIS — I351 Nonrheumatic aortic (valve) insufficiency: Secondary | ICD-10-CM | POA: Diagnosis not present

## 2018-05-01 DIAGNOSIS — G459 Transient cerebral ischemic attack, unspecified: Secondary | ICD-10-CM

## 2018-05-01 DIAGNOSIS — N400 Enlarged prostate without lower urinary tract symptoms: Secondary | ICD-10-CM

## 2018-05-01 DIAGNOSIS — I160 Hypertensive urgency: Secondary | ICD-10-CM

## 2018-05-01 DIAGNOSIS — R482 Apraxia: Secondary | ICD-10-CM | POA: Diagnosis not present

## 2018-05-01 DIAGNOSIS — E785 Hyperlipidemia, unspecified: Secondary | ICD-10-CM

## 2018-05-01 LAB — GLUCOSE, CAPILLARY
Glucose-Capillary: 114 mg/dL — ABNORMAL HIGH (ref 70–99)
Glucose-Capillary: 120 mg/dL — ABNORMAL HIGH (ref 70–99)
Glucose-Capillary: 104 mg/dL — ABNORMAL HIGH (ref 70–99)
Glucose-Capillary: 108 mg/dL — ABNORMAL HIGH (ref 70–99)

## 2018-05-01 LAB — LIPID PANEL
Cholesterol: 122 mg/dL (ref 0–200)
HDL: 24 mg/dL — ABNORMAL LOW (ref >40)
LDL Cholesterol: 30 mg/dL (ref 0–99)
Total CHOL/HDL Ratio: 5.1 RATIO
Triglycerides: 342 mg/dL — ABNORMAL HIGH (ref <150)
VLDL: 68 mg/dL — ABNORMAL HIGH (ref 0–40)

## 2018-05-01 LAB — HEMOGLOBIN A1C
Hgb A1c MFr Bld: 6 % — ABNORMAL HIGH (ref 4.8–5.6)
Mean Plasma Glucose: 125.5 mg/dL

## 2018-05-01 LAB — POCT I-STAT TROPONIN I: Troponin i, poc: 0.01 ng/mL (ref 0.00–0.08)

## 2018-05-01 LAB — ECHOCARDIOGRAM COMPLETE
Height: 63 in
Weight: 2384.5 [oz_av]

## 2018-05-01 MED ORDER — ASPIRIN EC 81 MG PO TBEC
81.0000 mg | DELAYED_RELEASE_TABLET | Freq: Every day | ORAL | Status: DC
  Administered 2018-05-02: 81 mg via ORAL
  Filled 2018-05-01: qty 1

## 2018-05-01 MED ORDER — ATORVASTATIN CALCIUM 10 MG PO TABS
20.0000 mg | ORAL_TABLET | ORAL | Status: DC
  Filled 2018-05-01: qty 2

## 2018-05-01 MED ORDER — AMLODIPINE BESYLATE 2.5 MG PO TABS
2.5000 mg | ORAL_TABLET | Freq: Every day | ORAL | Status: DC
  Administered 2018-05-01: 2.5 mg via ORAL
  Filled 2018-05-01: qty 1

## 2018-05-01 MED ORDER — HYDRALAZINE HCL 20 MG/ML IJ SOLN
10.0000 mg | INTRAMUSCULAR | Status: AC
  Administered 2018-05-01: 10 mg via INTRAVENOUS
  Filled 2018-05-01: qty 1

## 2018-05-01 MED ORDER — CLONIDINE HCL 0.1 MG PO TABS
0.0500 mg | ORAL_TABLET | Freq: Two times a day (BID) | ORAL | Status: DC
  Administered 2018-05-01 (×2): 0.05 mg via ORAL
  Filled 2018-05-01 (×2): qty 1

## 2018-05-01 MED ORDER — ATORVASTATIN CALCIUM 10 MG PO TABS
20.0000 mg | ORAL_TABLET | Freq: Every day | ORAL | Status: DC
  Administered 2018-05-02: 20 mg via ORAL
  Filled 2018-05-01: qty 2

## 2018-05-01 MED ORDER — ATORVASTATIN CALCIUM 10 MG PO TABS
20.0000 mg | ORAL_TABLET | Freq: Every day | ORAL | Status: DC
  Administered 2018-05-01: 20 mg via ORAL
  Filled 2018-05-01: qty 2

## 2018-05-01 MED ORDER — ATORVASTATIN CALCIUM 40 MG PO TABS: 40.0000 mg | ORAL_TABLET | Freq: Every day | ORAL | Status: DC

## 2018-05-01 MED ORDER — LABETALOL HCL 100 MG PO TABS
100.0000 mg | ORAL_TABLET | Freq: Two times a day (BID) | ORAL | Status: DC
  Administered 2018-05-01 – 2018-05-02 (×3): 100 mg via ORAL
  Filled 2018-05-01 (×3): qty 1

## 2018-05-01 NOTE — Progress Notes (Addendum)
STROKE TEAM PROGRESS NOTE   INTERVAL HISTORY His daughter Adam [name] is at the bedside.  He feels he is back to his baseline. They repeated his HPI for me.   Vitals:   04/30/18 2223 05/01/18 0020 05/01/18 0210 05/01/18 0356  BP:  (!) 135/59 (!) 167/63 (!) 132/56  Pulse: 73 72 73 71  Resp: 18 15 18 19   Temp: 98 F (36.7 C) 97.9 F (36.6 C) 97.9 F (36.6 C) 98 F (36.7 C)  TempSrc: Oral Oral Oral Oral  SpO2: 98% 97% 96% 98%  Weight:      Height:        CBC:  Recent Labs  Lab 04/30/18 1558  WBC 6.9  NEUTROABS 3.8  HGB 10.7*  HCT 33.0*  MCV 95.1  PLT 701    Basic Metabolic Panel:  Recent Labs  Lab 04/30/18 1445  NA 135  K 4.6  CL 104  CO2 21*  GLUCOSE 107*  BUN 35*  CREATININE 2.12*  CALCIUM 8.9   Lipid Panel:     Component Value Date/Time   CHOL 122 05/01/2018 0507   TRIG 342 (H) 05/01/2018 0507   HDL 24 (L) 05/01/2018 0507   CHOLHDL 5.1 05/01/2018 0507   VLDL 68 (H) 05/01/2018 0507   LDLCALC 30 05/01/2018 0507   HgbA1c:  Lab Results  Component Value Date   HGBA1C 6.0 (H) 05/01/2018   Urine Drug Screen: No results found for: LABOPIA, COCAINSCRNUR, LABBENZ, AMPHETMU, THCU, LABBARB  Alcohol Level     Component Value Date/Time   ETH <10 04/30/2018 1445    IMAGING Ct Head Wo Contrast  Result Date: 04/30/2018 CLINICAL DATA:  TIA. Transient speech disturbance and left leg weakness. EXAM: CT HEAD WITHOUT CONTRAST TECHNIQUE: Contiguous axial images were obtained from the base of the skull through the vertex without intravenous contrast. COMPARISON:  06/30/2016 brain MRI and 06/28/2016 CT FINDINGS: Brain: There is no evidence of acute infarct, intracranial hemorrhage, mass, midline shift, or extra-axial fluid collection. Moderate ventriculomegaly is unchanged and may represent central predominant cerebral atrophy or possibly normal pressure hydrocephalus in the appropriate clinical setting. A small chronic right cerebellar infarct is unchanged. Periventricular  white matter hypodensities are nonspecific but compatible with mild chronic small vessel ischemic disease. Vascular: Calcified atherosclerosis at the skull base. No hyperdense vessel. Skull: No fracture or focal osseous lesion. Sinuses/Orbits: Paranasal sinuses and mastoid air cells are clear. Bilateral cataract extraction. Other: None. IMPRESSION: 1. No evidence of acute intracranial abnormality. 2. Mild chronic small vessel ischemic disease and unchanged ventriculomegaly. Electronically Signed   By: Logan Bores M.D.   On: 04/30/2018 17:45   Mr Brain Wo Contrast  Result Date: 04/30/2018 CLINICAL DATA:  Initial evaluation for acute altered mental status, difficulty walking. EXAM: MRI HEAD WITHOUT CONTRAST MRA HEAD WITHOUT CONTRAST TECHNIQUE: Multiplanar, multiecho pulse sequences of the brain and surrounding structures were obtained without intravenous contrast. Angiographic images of the head were obtained using MRA technique without contrast. COMPARISON:  Prior CT from earlier the same day as well as previous MRI from 06/30/2016. FINDINGS: MRI HEAD FINDINGS Brain: Generalized age-related cerebral atrophy with mild chronic small vessel ischemic disease. Single small remote right cerebellar infarct noted. No abnormal foci of restricted diffusion to suggest acute or subacute ischemia. Gray-white matter differentiation otherwise maintained. No other is of chronic infarction. No evidence for acute or chronic intracranial hemorrhage. No mass lesion, midline shift or mass effect. Diffuse ventricular prominence related global parenchymal volume loss without hydrocephalus. No extra-axial fluid collection.  Pituitary gland within normal limits. Vascular: Major intracranial vascular flow voids are maintained. Skull and upper cervical spine: Craniocervical junction normal. Bone marrow signal intensity within normal limits. No scalp soft tissue abnormality. Sinuses/Orbits: Patient status post bilateral ocular lens  replacement. Paranasal sinuses are largely clear. Trace left mastoid effusion, of doubtful significance. Inner ear structures grossly normal. Other: None. MRA HEAD FINDINGS ANTERIOR CIRCULATION: Visualized distal cervical segments of the internal carotid arteries are patent with symmetric antegrade flow. Petrous segments widely patent bilaterally. Mild atheromatous irregularity throughout the carotid siphons without hemodynamically significant stenosis. A1 segments widely patent. Normal anterior communicating artery. Anterior cerebral arteries mildly irregular but patent to their distal aspects without high-grade stenosis. Mild atheromatous irregularity within the right M1 segment without significant stenosis. Normal right MCA bifurcation. Distal right MCA branches well perfused and patent. Focal moderate stenosis at the origin of the left M1 segment (series 7, image 94). Left M1 otherwise patent. Normal left MCA bifurcation. Focal moderate proximal left M2 stenosis, superior division noted (series 1034, image 10). Left MCA branches otherwise well perfused distally. POSTERIOR CIRCULATION: Visualized vertebral arteries patent to the vertebrobasilar junction without stenosis. Right vertebral artery dominant. Patent right PICA. Left PICA not seen. Dominant left AICA. Basilar patent to its distal aspect without stenosis. Superior cerebral arteries patent bilaterally. Predominant fetal type origin of the right PCA supplied via a robust right posterior communicating artery. Hypoplastic left P1 segment with prominent left posterior communicating artery. Focal moderate somewhat long segment proximal right P2 stenosis. Scattered atheromatous irregularity elsewhere within the PCAs without high-grade stenosis. No intracranial aneurysm. IMPRESSION: MRI HEAD IMPRESSION: 1. No acute intracranial abnormality. 2. Single small remote right cerebellar infarct. 3. Underlying age-related cerebral atrophy with mild chronic small vessel  ischemic disease. MRA HEAD IMPRESSION: 1. Patent intracranial arterial vasculature without large vessel occlusion. 2. Focal moderate proximal left M1 stenosis. 3. Somewhat long segment moderate proximal right P2 stenosis. 4. Mild atherosclerotic change elsewhere within the intracranial circulation. No other high-grade or correctable stenosis. Electronically Signed   By: Jeannine Boga M.D.   On: 04/30/2018 22:19   Mr Virgel Paling WG Contrast  Result Date: 04/30/2018 CLINICAL DATA:  Initial evaluation for acute altered mental status, difficulty walking. EXAM: MRI HEAD WITHOUT CONTRAST MRA HEAD WITHOUT CONTRAST TECHNIQUE: Multiplanar, multiecho pulse sequences of the brain and surrounding structures were obtained without intravenous contrast. Angiographic images of the head were obtained using MRA technique without contrast. COMPARISON:  Prior CT from earlier the same day as well as previous MRI from 06/30/2016. FINDINGS: MRI HEAD FINDINGS Brain: Generalized age-related cerebral atrophy with mild chronic small vessel ischemic disease. Single small remote right cerebellar infarct noted. No abnormal foci of restricted diffusion to suggest acute or subacute ischemia. Gray-white matter differentiation otherwise maintained. No other is of chronic infarction. No evidence for acute or chronic intracranial hemorrhage. No mass lesion, midline shift or mass effect. Diffuse ventricular prominence related global parenchymal volume loss without hydrocephalus. No extra-axial fluid collection. Pituitary gland within normal limits. Vascular: Major intracranial vascular flow voids are maintained. Skull and upper cervical spine: Craniocervical junction normal. Bone marrow signal intensity within normal limits. No scalp soft tissue abnormality. Sinuses/Orbits: Patient status post bilateral ocular lens replacement. Paranasal sinuses are largely clear. Trace left mastoid effusion, of doubtful significance. Inner ear structures  grossly normal. Other: None. MRA HEAD FINDINGS ANTERIOR CIRCULATION: Visualized distal cervical segments of the internal carotid arteries are patent with symmetric antegrade flow. Petrous segments widely patent bilaterally. Mild atheromatous irregularity throughout  the carotid siphons without hemodynamically significant stenosis. A1 segments widely patent. Normal anterior communicating artery. Anterior cerebral arteries mildly irregular but patent to their distal aspects without high-grade stenosis. Mild atheromatous irregularity within the right M1 segment without significant stenosis. Normal right MCA bifurcation. Distal right MCA branches well perfused and patent. Focal moderate stenosis at the origin of the left M1 segment (series 7, image 94). Left M1 otherwise patent. Normal left MCA bifurcation. Focal moderate proximal left M2 stenosis, superior division noted (series 1034, image 10). Left MCA branches otherwise well perfused distally. POSTERIOR CIRCULATION: Visualized vertebral arteries patent to the vertebrobasilar junction without stenosis. Right vertebral artery dominant. Patent right PICA. Left PICA not seen. Dominant left AICA. Basilar patent to its distal aspect without stenosis. Superior cerebral arteries patent bilaterally. Predominant fetal type origin of the right PCA supplied via a robust right posterior communicating artery. Hypoplastic left P1 segment with prominent left posterior communicating artery. Focal moderate somewhat long segment proximal right P2 stenosis. Scattered atheromatous irregularity elsewhere within the PCAs without high-grade stenosis. No intracranial aneurysm. IMPRESSION: MRI HEAD IMPRESSION: 1. No acute intracranial abnormality. 2. Single small remote right cerebellar infarct. 3. Underlying age-related cerebral atrophy with mild chronic small vessel ischemic disease. MRA HEAD IMPRESSION: 1. Patent intracranial arterial vasculature without large vessel occlusion. 2. Focal  moderate proximal left M1 stenosis. 3. Somewhat long segment moderate proximal right P2 stenosis. 4. Mild atherosclerotic change elsewhere within the intracranial circulation. No other high-grade or correctable stenosis. Electronically Signed   By: Jeannine Boga M.D.   On: 04/30/2018 22:19   Vas US Carotid  Result Date: 05/01/2018 Carotid Arterial Duplex Study Indications:       TIA. Comparison Study:  No prior study available Performing Technologist: Rudell Cobb  Examination Guidelines: A complete evaluation includes B-mode imaging, spectral Doppler, color Doppler, and power Doppler as needed of all accessible portions of each vessel. Bilateral testing is considered an integral part of a complete examination. Limited examinations for reoccurring indications may be performed as noted.  Right Carotid Findings: +----------+--------+--------+--------+--------------------+--------+           PSV cm/sEDV cm/sStenosisDescribe            Comments +----------+--------+--------+--------+--------------------+--------+ CCA Prox  66      9                                   tortuous +----------+--------+--------+--------+--------------------+--------+ CCA Mid   92      14              diffuse and calcific         +----------+--------+--------+--------+--------------------+--------+ CCA Distal118     22              diffuse and calcific         +----------+--------+--------+--------+--------------------+--------+ ICA Prox  101     26      1-39%   focal and calcific           +----------+--------+--------+--------+--------------------+--------+ ICA Mid   96      28                                           +----------+--------+--------+--------+--------------------+--------+ ICA Distal94      26                                           +----------+--------+--------+--------+--------------------+--------+  ECA       157     16                                            +----------+--------+--------+--------+--------------------+--------+ +----------+--------+-------+-------------+-------------------+           PSV cm/sEDV cmsDescribe     Arm Pressure (mmHG) +----------+--------+-------+-------------+-------------------+ Subclavian140            calcification                    +----------+--------+-------+-------------+-------------------+ +---------+--------+--+--------+--+---------+ VertebralPSV cm/s42EDV cm/s11Antegrade +---------+--------+--+--------+--+---------+  Left Carotid Findings: +----------+--------+--------+--------+--------------------+--------+           PSV cm/sEDV cm/sStenosisDescribe            Comments +----------+--------+--------+--------+--------------------+--------+ CCA Prox  90      19                                           +----------+--------+--------+--------+--------------------+--------+ CCA Mid   91      19              focal and calcific           +----------+--------+--------+--------+--------------------+--------+ CCA Distal93      14              diffuse and calcific         +----------+--------+--------+--------+--------------------+--------+ ICA Prox  120     24      1-39%   focal and calcific           +----------+--------+--------+--------+--------------------+--------+ ICA Mid   97      21                                           +----------+--------+--------+--------+--------------------+--------+ ICA Distal72      14                                           +----------+--------+--------+--------+--------------------+--------+ ECA       88                                                   +----------+--------+--------+--------+--------------------+--------+ +----------+--------+--------+--------+-------------------+ SubclavianPSV cm/sEDV cm/sDescribeArm Pressure (mmHG) +----------+--------+--------+--------+-------------------+           139                                          +----------+--------+--------+--------+-------------------+ +---------+--------+--+--------+----------------------------+ VertebralPSV cm/s26EDV cm/sHigh resistant and Antegrade +---------+--------+--+--------+----------------------------+  Summary: Right Carotid: Velocities in the right ICA are consistent with a 1-39% stenosis. Left Carotid: Velocities in the left ICA are consistent with a 1-39% stenosis. Vertebrals: Bilateral vertebral arteries demonstrate antegrade flow. Left             vertebral artery demonstrates high resistant flow. *See table(s) above for measurements and observations.  Electronically signed by  Servando Snare MD on 05/01/2018 at 2:41:59 PM.    Final    2D Echocardiogram  1. The left ventricle has normal systolic function of 57-32%. The cavity size is severely increased. There is no left ventricular wall thickness. Echo evidence of pseudonormal diastolic filling patterns. Elevated left ventricular end-diastolic pressure.  2. Grade 2 diastolic dysfunction.  3. Severely dilated left atrial size.  4. Normal right atrial size.  5. Normal tricuspid valve.  6. No atrial level shunt detected by color flow Doppler.   PHYSICAL EXAM Constitutional: Appears well-developed and well-nourished.  Psych: Affect appropriate to situation Eyes: No scleral injection HENT: No OP obstrucion Head: Normocephalic.  Cardiovascular: Normal rate and regular rhythm.  Respiratory: Effort normal, non-labored breathing Skin: WDI  Neuro: Mental Status: Patient is awake, alert, oriented to person, place, month, year, and situation Patient is able to give a clear and coherent history. No signs of aphasia or neglect Cranial Nerves: II: Visual Fields are full. III,IV, VI: EOMI without ptosis or diploplia.  Pupils are equal, round, and reactive to light.   V: Facial sensation is symmetric to temperature VII: Facial movement is symmetric.  VIII: hearing is  intact to voice X: Uvula elevates symmetrically XI: Shoulder shrug is symmetric. XII: tongue is midline without atrophy or fasciculations.  Motor: Tone is normal. Bulk is normal. 5/5 strength was present in all four extremities.  Sensory: Sensation is symmetric to light touch and temperature in the arms and legs. Plantars: Toes are downgoing bilaterally.  Cerebellar: FNF intact bilaterally   ASSESSMENT/PLAN Mr. Sajid Ruppert is a 85 y.o. male with history of hypertension, hyperlipidemia, heart murmur, glaucoma, hiatal hernia, diverticulitis, diabetes presenting with 2 episodes of confusion.   Hypertensive Encephalopathy, less likely TIA  CT head No acute stroke. Small vessel disease. Stable ventriculomegaly.   MRI  No stroke. Old R cerebellar infarct. Small vessel disease. Atrophy.  MRA  Mod L M1 stenosis. Mod prox R P2 stenosis. Global mild atherosclerosis   Carotid Doppler  B ICA 1-39% stenosis, VAs antegrade   2D Echo  EF 60-65%. No source of embolus   LDL 30  HgbA1c 6.0  Lovenox 30 mg sq daily for VTE prophylaxis  No antithrombotic prior to admission, now on aspirin 325 mg daily. Recommend aspirin 81 mg daily at d/c  Therapy recommendations:  HH OT, PT pending  - no anticipated needs  Disposition:  pending (lives at [place])  Follow up neuro in 4-6 weeks. Order placed  Hypertensive Urgency  BP > 250 on arrival, now 180s . Ok for BP goal normotensive as no stroke dx  Hyperlipidemia  Home meds:  lipitor 40  Ordered lipitor 20 for today, then 40 starting tomorrow  LDL 30, goal < 70  Recommend decreasing statin dose to 20 mg daily. Recheck in 6-8 weeks  Continue statin at discharge  Diabetes type II  HgbA1c 6.0, goal < 7.0  Controlled  Other Stroke Risk Factors  Advanced age  Former Cigarette smoker  ETOH use, advised to drink no more than 2 drink(s) a day  Other Active Problems  BPH on finasteride  Hospital day # 0  Burnetta Sabin, MSN,  APRN, ANVP-BC, AGPCNP-BC Advanced Practice Stroke Nurse [place] for Schedule & Pager information 05/01/2018 10:54 AM   ATTENDING NOTE: I reviewed above note and agree with the assessment and plan. Pt was seen and examined.   85 year old male with history of diabetes, hypertension, hyperlipidemia, BPH admitted for episode of apraxia  and resolved, headache with elevated blood pressure, episode of walking imbalance and resolved.  On admission BP 255/84, currently BP gradually under control, and rounding BP 160s.  CT no acute finding.  MRI no acute infarct, but old small right cerebellar infarct.  MRA showed left M1 and right P2 stenosis.  LDL 30 and A1c 6.0.  EF 60 to 65%.  Carotid Doppler unremarkable.  On examination, patient awake alert, AAO x3, neurologically intact.  Patient episode concerning for hypertensive encephalopathy with episodic apraxia, walking difficulty and headache.  Recommend gradually normalize BP in 2 to 3 days.  Continue aspirin 325 and decrease Lipitor from 40 to 20 for stroke prevention.  Stroke risk factor modification.  PT/OT recommend home health PT/OT.  Given severely dilated left atrial size on 2D echo, recommend 30-day cardio event monitor as outpatient to rule out A. Fib. Neurology will sign off. Please call with questions. Pt will follow up with stroke clinic NP at [facility] in about 4 weeks. Thanks for the consult.   Rosalin Hawking, MD PhD Stroke Neurology 05/01/2018 10:34 PM     To contact Stroke Continuity provider, please refer to [email or web address] After hours, contact General Neurology

## 2018-05-01 NOTE — Care Management Obs Status (Signed)
[place] NOTIFICATION   Patient Details  Name: Adam [name] MRN: [id number] Date of Birth: 08/07/1930   Medicare Observation Status Notification Given:  Yes    Pollie Friar, RN 05/01/2018, 4:27 PM

## 2018-05-01 NOTE — Progress Notes (Signed)
  Echocardiogram 2D Echocardiogram has been performed.  Jordany Russett G Glennis Borger 05/01/2018, 11:21 AM

## 2018-05-01 NOTE — Evaluation (Signed)
Speech Language Pathology Evaluation Patient Details Name: Adam [name] MRN: [id number] DOB: Aug 11, 1930 Today's Date: 05/01/2018 Time: 1533-1610 SLP Time Calculation (min) (ACUTE ONLY): 37 min  Problem List:  Patient Active Problem List   Diagnosis Date Noted  . Confusion 04/30/2018  . Apraxia 04/30/2018  . HTN (hypertension) 04/30/2018  . HLD (hyperlipidemia) 04/30/2018  . Type 2 diabetes mellitus ([place]) 04/30/2018  . BPH (benign prostatic hyperplasia) 04/30/2018  . Degenerative disc disease, lumbar 04/21/2018  . Pronation deformity of ankle, acquired 04/21/2018  . Gait abnormality 11/08/2016  . Dizziness and giddiness 06/21/2016  . Meniscus, lateral, anterior horn derangement 12/25/2011  . Meniscus, medial, bucket handle tear, old 12/25/2011  . Osteoarthritis of right knee 12/25/2011   Past Medical History:  Past Medical History:  Diagnosis Date  . Acute meniscal tear of knee RIGHT KNEE  . Arthritis   . BPH (benign prostatic hypertrophy)   . Colon polyps    hyperplastic  . Diabetes mellitus type 2, diet-controlled (HCC) AND EXERCISE-- LAST A1C  5.6  . Diverticulosis   . Gait abnormality 11/08/2016  . GERD (gastroesophageal reflux disease)   . Glaucoma   . H/O hiatal hernia   . Heart murmur   . History of esophageal stricture S/P DILATION IN 2007  . Hyperlipidemia   . Hypertension   . IgM monoclonal gammopathy of uncertain significance ASYMPTOMATIC    FOLLOWED BY DR Alen Blew  . Internal hemorrhoids   . Macrocytic anemia DUE TO CHRONIC RENAL INSUFF.  . Nocturia   . Right knee pain    Past Surgical History:  Past Surgical History:  Procedure Laterality Date  . CATARACT EXTRACTION W/ INTRAOCULAR LENS  IMPLANT, BILATERAL  2012  . HEMORRHOID SURGERY    . HERNIA REPAIR    . KNEE ARTHROSCOPY  12/25/2011   Procedure: ARTHROSCOPY KNEE;  Surgeon: Tobi Bastos, MD;  [facility];  Service: Orthopedics;  Laterality: Right;  right knee arthroscopy with  medial menisectomy     HPI:  t is an 85 y.o. male with medical history significant of, hyperlipidemia, GERD, and type 2 diabetes who presented to the hospital for evaluation of difficulty walking. The patient's daughter stated that on 04/29/18 the patient forgot about a meeting and at lunchtime he grabbed a can opener but did not know what to do with it. Per the daughter, by dinnertime he was back to baseline but was complaining of a headache. The MRI of 04/30/18 was negative for and acute infarct but revealed an old R cerebellar infarct, small vessel disease, and atrophy.   Assessment / Plan / Recommendation Clinical Impression  Pt was seen for evaluation with his daughter present. The pt's daughter reported confusion and difficulty with memory immediately prior to admission but stated that he has returned to baseline. [facility] Cognitive Assessment 8.1 was completed to evaluate the pt's cognitive-linguistic skills. He achieved 19/30 on this assessment which is below the normal limits of 26 or more out of 30 and is suggestive of a mild cognitive impairment. However, based on informal assessment and with consideration of his needs in his home environment, his cognition appears to be within functional limits at this time. Further skilled SLP services are not clinically indicated at this time.     SLP Assessment  SLP Recommendation/Assessment: Patient does not need any further Speech [place] [facility] SLP Visit Diagnosis: Cognitive communication deficit (R41.841)    Follow Up Recommendations  None    Frequency and Duration  SLP Evaluation Cognition  Overall Cognitive Status: Impaired/Different from baseline Orientation Level: Oriented X4 Attention: Focused;Sustained Focused Attention: Appears intact Sustained Attention: Impaired Sustained Attention Impairment: Verbal complex(Serial 7s: 1/3) Memory: Impaired Memory Impairment: Retrieval deficit(Immediate 5/5; Delayed  0/5; 3/5 with cues) Awareness: Appears intact Problem Solving: Appears intact Executive Function: Sequencing;Reasoning Reasoning: Appears intact(Abstraction: 2/2) Sequencing: Impaired(Clock drawing 2/3) Sequencing Impairment: Verbal complex       Comprehension  Auditory Comprehension Overall Auditory Comprehension: Appears within functional limits for tasks assessed Yes/No Questions: Within Functional Limits Commands: Impaired Complex Commands: (Difficulty following complex commands- trail: 0/1) Reading Comprehension Reading Status: Not tested    Expression Expression Primary Mode of Expression: Verbal Verbal Expression Overall Verbal Expression: Appears within functional limits for tasks assessed Initiation: No impairment Automatic Speech: Name Level of Generative/Spontaneous Verbalization: Phrase;Sentence Naming: Impairment Confrontation: Within functional limits Divergent: (9 items per category) Pragmatics: No impairment Interfering Components: Attention Written Expression Dominant Hand: Right   Oral / Motor  Oral Motor/Sensory Function Overall Oral Motor/Sensory Function: Within functional limits Motor Speech Overall Motor Speech: Appears within functional limits for tasks assessed Respiration: Within functional limits Phonation: Normal Resonance: Within functional limits Articulation: Within functional limitis Intelligibility: Intelligible Motor Planning: Witnin functional limits Motor Speech Errors: Not applicable   Ashanna Heinsohn I. Hardin Negus, [place], [place] Office number [phone] Pager [place] 05/01/2018, 4:27 PM

## 2018-05-01 NOTE — Evaluation (Addendum)
Physical Therapy Evaluation Patient Details Name: Adam [name] MRN: [id number] DOB: 1930-05-26 Today's Date: 05/01/2018   History of Present Illness  Pt is an 85 y.o. male with medical history significant of, hyperlipidemia, GERD, type 2 diabetes, BPH presenting to the hospital for evaluation of difficulty walking and confusion.    Clinical Impression  Pt presented supine in bed with HOB elevated, awake and willing to participate in therapy session. [name] present throughout session as well. Prior to admission, pt reported that he was independent with all functional mobility and ADLs. Pt lives at [place]. He is active and goes to exercise classes 3-5x/week. He currently requires supervision to min guard level with functional mobility. Limited secondary to increased BP; however, RN aware and approving of evaluation. Pt would continue to benefit from skilled physical therapy services at this time while admitted and after d/c to address the below listed limitations in order to improve overall safety and independence with functional mobility.  BP in supine at beginning of session = 206/67 mmHg BP in sitting prior to activity = 220/81 mmHg BP in sitting after ambulation = 208/77 mmHg    Follow Up Recommendations Home health PT    Equipment Recommendations  None recommended by PT    Recommendations for Other Services       Precautions / Restrictions Precautions Precautions: Fall Restrictions Weight Bearing Restrictions: No      Mobility  Bed Mobility Overal bed mobility: Needs Assistance Bed Mobility: Supine to Sit     Supine to sit: Supervision     General bed mobility comments: supervision for safety  Transfers Overall transfer level: Needs assistance Equipment used: None Transfers: Sit to/from Stand Sit to Stand: Min guard         General transfer comment: min guard for safety, mild instability but no physical assistance  needed  Ambulation/Gait Ambulation/Gait assistance: Min guard Gait Distance (Feet): 30 Feet Assistive device: None Gait Pattern/deviations: Step-through pattern;Decreased step length - right;Decreased step length - left;Decreased stride length;Shuffle Gait velocity: decreased   General Gait Details: pt with modest instability but no overt LOB or need for physical assistance, close min guard for safety - limited secondary to elevated BP  Stairs            Wheelchair Mobility    Modified Rankin (Stroke Patients Only)       Balance Overall balance assessment: Needs assistance Sitting-balance support: Feet supported Sitting balance-Leahy Scale: Good     Standing balance support: No upper extremity supported Standing balance-Leahy Scale: Fair                               Pertinent Vitals/Pain Pain Assessment: No/denies pain    Home Living Family/patient expects to be discharged to:: Private residence Living Arrangements: Alone Available Help at Discharge: Family(daughter lives in [place]) Type of Home: Independent living facility([place]) Home Access: Level entry     Home Layout: One [place]: Environmental consultant - 4 wheels;Walker - 2 wheels;Shower seat;Grab bars - tub/shower      Prior Function Level of Independence: Independent               Hand Dominance        Extremity/Trunk Assessment   Upper Extremity Assessment Upper Extremity Assessment: Defer to OT evaluation    Lower Extremity Assessment Lower Extremity Assessment: Generalized weakness       Communication   Communication: HOH  Cognition Arousal/Alertness: Awake/alert  Behavior During Therapy: WFL for tasks assessed/performed Overall Cognitive Status: Impaired/Different from baseline Area of Impairment: Memory;Problem solving                     Memory: Decreased short-term memory       Problem Solving: Slow processing        General Comments       Exercises     Assessment/Plan    PT Assessment Patient needs continued PT services  PT Problem List Decreased strength;Decreased activity tolerance;Decreased balance;Decreased mobility;Decreased coordination;Decreased safety awareness       PT Treatment Interventions DME instruction;Gait training;Stair training;Functional mobility training;Therapeutic activities;Therapeutic exercise;Balance training;Neuromuscular re-education;Patient/family education    PT Goals (Current goals can be found in the Care Plan section)  Acute Rehab PT Goals Patient Stated Goal: "to go home" PT Goal Formulation: With patient/family Time For Goal Achievement: 05/15/18 Potential to Achieve Goals: Good    Frequency Min 3X/week   Barriers to discharge        Co-evaluation               AM-PAC PT "6 Clicks" Mobility  Outcome Measure Help needed turning from your back to your side while in a flat bed without using bedrails?: None Help needed moving from lying on your back to sitting on the side of a flat bed without using bedrails?: None Help needed moving to and from a bed to a chair (including a wheelchair)?: None Help needed standing up from a chair using your arms (e.g., wheelchair or bedside chair)?: None Help needed to walk in hospital room?: A Little Help needed climbing 3-5 steps with a railing? : A Lot 6 Click Score: 21    End of Session Equipment Utilized During Treatment: Gait belt Activity Tolerance: Patient tolerated treatment well Patient left: in bed;with call bell/phone within reach;with family/visitor present;Other (comment)(sitting EOB, RN present) Nurse Communication: Mobility status PT Visit Diagnosis: Other abnormalities of gait and mobility (R26.89)    Time: 4696-2952 PT Time Calculation (min) (ACUTE ONLY): 25 min   Charges:   PT Evaluation $PT Eval Moderate Complexity: 1 Mod PT Treatments $Gait Training: 8-22 mins        Sherie Don, PT, DPT  Acute  Rehabilitation Services Pager [phone] Office [place] 05/01/2018, 4:19 PM

## 2018-05-01 NOTE — Progress Notes (Addendum)
Progress Note    Adam [name]  GGE:[id number] DOB: 1930/06/17  DOA: 04/30/2018 PCP: Shon Baton, MD    Brief Narrative:   Chief complaint: Difficulty walking  Medical records reviewed and are as summarized below:  Adam [name] is an 85 y.o. male pmh of HTN, HLD, diabetes mellitus type 2, and BPH; who presented after having difficulty doing things regular activities.  Assessment/Plan:   Principal Problem:   Apraxia Active Problems:   Confusion   HTN (hypertension)   HLD (hyperlipidemia)   Type 2 diabetes mellitus (HCC)   BPH (benign prostatic hyperplasia)  Apraxia/AMS: Resolved.  Patient was evaluated by neurology.  Initial CT scan of the brain showed no acute abnormality mild chronic small vessel disease.  Neurology recommended completion of stroke work-up.  He reports taking blood pressure medications as prescribed.  MRI/MRA of the brain showed no acute signs of a stroke, but did show some focal moderate proximal left M1 stenosis with moderate proximal right P2 stenosis.  Blood pressures noted to be significantly elevated, but no signs of PRES. Neurology concern for hypertensive encephalopathy and less likely TIA. -Neurochecks -Continue aspirin and statin -Echocardiogram impression:  1. The left ventricle has normal systolic function of 46-50%. The cavity size is severely increased. There is no left ventricular wall thickness. Echo evidence of pseudonormal diastolic filling patterns. Elevated left ventricular end-diastolic pressure.   2. Grade 2 diastolic dysfunction.   3. Severely dilated left atrial size.   4. Normal right atrial size.   5. Normal tricuspid valve.   6. No atrial level shunt detected by color flow Doppler. -Physical therapy recommending home PT -Appreciate neurology consultative services, will follow-up for further recommendations  -Will need to have set up for 30-day event monitor as there may be concern for atrial fibrillation given left atrial enlargement  per neurology  Essential hypertension/hypertensive urgency: On admission patient was allowed to have permissive hypertension up to 220/120 in the setting of suspected stroke.  Patient missed 2 doses of his regular home medications of labetalol, clonidine, and amlodipine.  Suspecting rebound hypertension. -Will restart blood pressure medications -Hydralazine IV as needed for systolic blood pressure greater than 180  Diabetes mellitus type 2: Patient not on any diabetic medications at this time.  Hemoglobin A1c noted to be 6.  Dyslipidemia: Total cholesterol 122, HDL 24, LDL 30, triglycerides 342, VLDL 68.  Patient taking atorvastatin 40 mg at home.  -Decreased dose of atorvastatin to 20 mg  BPH -Continue finasteride  Body mass index is 26.4 kg/m.   Family Communication/Anticipated D/C date and plan/Code Status   DVT prophylaxis: Lovenox ordered. Code Status: Full Code.  Family Communication: Discussed plan of care with the patient and daughter present at bedside Disposition Plan: Possible discharge home tomorrow   Medical Consultants:    Neurology   Anti-Infectives:    None  Subjective:   Patient reports no recurrence of symptoms.  Patient's daughter is concerned regarding blood pressure being in the 200s and wonders what they should do at home.  Objective:    Vitals:   05/01/18 2000 05/01/18 2100 05/01/18 2200 05/01/18 2351  BP:    (!) 185/59  Pulse:    63  Resp: 15 15 14 17   Temp:    98 F (36.7 C)  TempSrc:    Oral  SpO2:    96%  Weight:      Height:       No intake or output data in the 24 hours ending  05/02/18 0323 Filed Weights   04/30/18 1809 04/30/18 2026  Weight: 68.5 kg 67.6 kg    Exam: Constitutional: NAD, calm, comfortable Eyes: PERRL, lids and conjunctivae normal ENMT: Mucous membranes are moist. Posterior pharynx clear of any exudate or lesions.   Neck: normal, supple, no masses, no thyromegaly Respiratory: clear to auscultation  bilaterally, no wheezing, no crackles. Normal respiratory effort. No accessory muscle use.  Cardiovascular: Regular rate and rhythm, no murmurs / rubs / gallops. No extremity edema. 2+ pedal pulses. No carotid bruits.  Abdomen: no tenderness, no masses palpated. No hepatosplenomegaly. Bowel sounds positive.  Musculoskeletal: no clubbing / cyanosis. No joint deformity upper and lower extremities. Good ROM, no contractures. Normal muscle tone.  Skin: no rashes, lesions, ulcers. No induration Neurologic: CN 2-12 grossly intact. Sensation intact, DTR normal. Strength 5/5 in all 4.  Psychiatric: Normal judgment and insight. Alert and oriented x 3. Normal mood.    Data Reviewed:   I have personally reviewed following labs and imaging studies:  Labs: Labs show the following:   Basic Metabolic Panel: Recent Labs  Lab 04/30/18 1445  NA 135  K 4.6  CL 104  CO2 21*  GLUCOSE 107*  BUN 35*  CREATININE 2.12*  CALCIUM 8.9   GFR Estimated Creatinine Clearance: 20.5 mL/min (A) (by C-G formula based on SCr of 2.12 mg/dL (H)). Liver Function Tests: Recent Labs  Lab 04/30/18 1445  AST 18  ALT 20  ALKPHOS 53  BILITOT 0.5  PROT 6.1*  ALBUMIN 3.9   No results for input(s): LIPASE, AMYLASE in the last 168 hours. No results for input(s): AMMONIA in the last 168 hours. Coagulation profile Recent Labs  Lab 04/30/18 1445  INR 0.99    CBC: Recent Labs  Lab 04/30/18 1558  WBC 6.9  NEUTROABS 3.8  HGB 10.7*  HCT 33.0*  MCV 95.1  PLT 156   Cardiac Enzymes: No results for input(s): CKTOTAL, CKMB, CKMBINDEX, TROPONINI in the last 168 hours. BNP (last 3 results) No results for input(s): PROBNP in the last 8760 hours. CBG: Recent Labs  Lab 04/30/18 2220 05/01/18 0605 05/01/18 1117 05/01/18 1645 05/01/18 2120  GLUCAP 113* 114* 120* 104* 108*   D-Dimer: No results for input(s): DDIMER in the last 72 hours. Hgb A1c: Recent Labs    05/01/18 0507  HGBA1C 6.0*   Lipid  Profile: Recent Labs    05/01/18 0507  CHOL 122  HDL 24*  LDLCALC 30  TRIG 342*  CHOLHDL 5.1   Thyroid function studies: No results for input(s): TSH, T4TOTAL, T3FREE, THYROIDAB in the last 72 hours.  Invalid input(s): FREET3 Anemia work up: No results for input(s): VITAMINB12, FOLATE, FERRITIN, TIBC, IRON, RETICCTPCT in the last 72 hours. Sepsis Labs: Recent Labs  Lab 04/30/18 1558  WBC 6.9    Microbiology No results found for this or any previous visit (from the past 240 hour(s)).  Procedures and diagnostic studies:  Ct Head Wo Contrast  Result Date: 04/30/2018 CLINICAL DATA:  TIA. Transient speech disturbance and left leg weakness. EXAM: CT HEAD WITHOUT CONTRAST TECHNIQUE: Contiguous axial images were obtained from the base of the skull through the vertex without intravenous contrast. COMPARISON:  06/30/2016 brain MRI and 06/28/2016 CT FINDINGS: Brain: There is no evidence of acute infarct, intracranial hemorrhage, mass, midline shift, or extra-axial fluid collection. Moderate ventriculomegaly is unchanged and may represent central predominant cerebral atrophy or possibly normal pressure hydrocephalus in the appropriate clinical setting. A small chronic right cerebellar infarct is unchanged. Periventricular  white matter hypodensities are nonspecific but compatible with mild chronic small vessel ischemic disease. Vascular: Calcified atherosclerosis at the skull base. No hyperdense vessel. Skull: No fracture or focal osseous lesion. Sinuses/Orbits: Paranasal sinuses and mastoid air cells are clear. Bilateral cataract extraction. Other: None. IMPRESSION: 1. No evidence of acute intracranial abnormality. 2. Mild chronic small vessel ischemic disease and unchanged ventriculomegaly. Electronically Signed   By: Logan Bores M.D.   On: 04/30/2018 17:45   Mr Brain Wo Contrast  Result Date: 04/30/2018 CLINICAL DATA:  Initial evaluation for acute altered mental status, difficulty walking.  EXAM: MRI HEAD WITHOUT CONTRAST MRA HEAD WITHOUT CONTRAST TECHNIQUE: Multiplanar, multiecho pulse sequences of the brain and surrounding structures were obtained without intravenous contrast. Angiographic images of the head were obtained using MRA technique without contrast. COMPARISON:  Prior CT from earlier the same day as well as previous MRI from 06/30/2016. FINDINGS: MRI HEAD FINDINGS Brain: Generalized age-related cerebral atrophy with mild chronic small vessel ischemic disease. Single small remote right cerebellar infarct noted. No abnormal foci of restricted diffusion to suggest acute or subacute ischemia. Gray-white matter differentiation otherwise maintained. No other is of chronic infarction. No evidence for acute or chronic intracranial hemorrhage. No mass lesion, midline shift or mass effect. Diffuse ventricular prominence related global parenchymal volume loss without hydrocephalus. No extra-axial fluid collection. Pituitary gland within normal limits. Vascular: Major intracranial vascular flow voids are maintained. Skull and upper cervical spine: Craniocervical junction normal. Bone marrow signal intensity within normal limits. No scalp soft tissue abnormality. Sinuses/Orbits: Patient status post bilateral ocular lens replacement. Paranasal sinuses are largely clear. Trace left mastoid effusion, of doubtful significance. Inner ear structures grossly normal. Other: None. MRA HEAD FINDINGS ANTERIOR CIRCULATION: Visualized distal cervical segments of the internal carotid arteries are patent with symmetric antegrade flow. Petrous segments widely patent bilaterally. Mild atheromatous irregularity throughout the carotid siphons without hemodynamically significant stenosis. A1 segments widely patent. Normal anterior communicating artery. Anterior cerebral arteries mildly irregular but patent to their distal aspects without high-grade stenosis. Mild atheromatous irregularity within the right M1 segment  without significant stenosis. Normal right MCA bifurcation. Distal right MCA branches well perfused and patent. Focal moderate stenosis at the origin of the left M1 segment (series 7, image 94). Left M1 otherwise patent. Normal left MCA bifurcation. Focal moderate proximal left M2 stenosis, superior division noted (series 1034, image 10). Left MCA branches otherwise well perfused distally. POSTERIOR CIRCULATION: Visualized vertebral arteries patent to the vertebrobasilar junction without stenosis. Right vertebral artery dominant. Patent right PICA. Left PICA not seen. Dominant left AICA. Basilar patent to its distal aspect without stenosis. Superior cerebral arteries patent bilaterally. Predominant fetal type origin of the right PCA supplied via a robust right posterior communicating artery. Hypoplastic left P1 segment with prominent left posterior communicating artery. Focal moderate somewhat long segment proximal right P2 stenosis. Scattered atheromatous irregularity elsewhere within the PCAs without high-grade stenosis. No intracranial aneurysm. IMPRESSION: MRI HEAD IMPRESSION: 1. No acute intracranial abnormality. 2. Single small remote right cerebellar infarct. 3. Underlying age-related cerebral atrophy with mild chronic small vessel ischemic disease. MRA HEAD IMPRESSION: 1. Patent intracranial arterial vasculature without large vessel occlusion. 2. Focal moderate proximal left M1 stenosis. 3. Somewhat long segment moderate proximal right P2 stenosis. 4. Mild atherosclerotic change elsewhere within the intracranial circulation. No other high-grade or correctable stenosis. Electronically Signed   By: Jeannine Boga M.D.   On: 04/30/2018 22:19   Mr Jodene Nam Head Wo Contrast  Result Date: 04/30/2018 CLINICAL DATA:  Initial evaluation for acute altered mental status, difficulty walking. EXAM: MRI HEAD WITHOUT CONTRAST MRA HEAD WITHOUT CONTRAST TECHNIQUE: Multiplanar, multiecho pulse sequences of the brain and  surrounding structures were obtained without intravenous contrast. Angiographic images of the head were obtained using MRA technique without contrast. COMPARISON:  Prior CT from earlier the same day as well as previous MRI from 06/30/2016. FINDINGS: MRI HEAD FINDINGS Brain: Generalized age-related cerebral atrophy with mild chronic small vessel ischemic disease. Single small remote right cerebellar infarct noted. No abnormal foci of restricted diffusion to suggest acute or subacute ischemia. Gray-white matter differentiation otherwise maintained. No other is of chronic infarction. No evidence for acute or chronic intracranial hemorrhage. No mass lesion, midline shift or mass effect. Diffuse ventricular prominence related global parenchymal volume loss without hydrocephalus. No extra-axial fluid collection. Pituitary gland within normal limits. Vascular: Major intracranial vascular flow voids are maintained. Skull and upper cervical spine: Craniocervical junction normal. Bone marrow signal intensity within normal limits. No scalp soft tissue abnormality. Sinuses/Orbits: Patient status post bilateral ocular lens replacement. Paranasal sinuses are largely clear. Trace left mastoid effusion, of doubtful significance. Inner ear structures grossly normal. Other: None. MRA HEAD FINDINGS ANTERIOR CIRCULATION: Visualized distal cervical segments of the internal carotid arteries are patent with symmetric antegrade flow. Petrous segments widely patent bilaterally. Mild atheromatous irregularity throughout the carotid siphons without hemodynamically significant stenosis. A1 segments widely patent. Normal anterior communicating artery. Anterior cerebral arteries mildly irregular but patent to their distal aspects without high-grade stenosis. Mild atheromatous irregularity within the right M1 segment without significant stenosis. Normal right MCA bifurcation. Distal right MCA branches well perfused and patent. Focal moderate  stenosis at the origin of the left M1 segment (series 7, image 94). Left M1 otherwise patent. Normal left MCA bifurcation. Focal moderate proximal left M2 stenosis, superior division noted (series 1034, image 10). Left MCA branches otherwise well perfused distally. POSTERIOR CIRCULATION: Visualized vertebral arteries patent to the vertebrobasilar junction without stenosis. Right vertebral artery dominant. Patent right PICA. Left PICA not seen. Dominant left AICA. Basilar patent to its distal aspect without stenosis. Superior cerebral arteries patent bilaterally. Predominant fetal type origin of the right PCA supplied via a robust right posterior communicating artery. Hypoplastic left P1 segment with prominent left posterior communicating artery. Focal moderate somewhat long segment proximal right P2 stenosis. Scattered atheromatous irregularity elsewhere within the PCAs without high-grade stenosis. No intracranial aneurysm. IMPRESSION: MRI HEAD IMPRESSION: 1. No acute intracranial abnormality. 2. Single small remote right cerebellar infarct. 3. Underlying age-related cerebral atrophy with mild chronic small vessel ischemic disease. MRA HEAD IMPRESSION: 1. Patent intracranial arterial vasculature without large vessel occlusion. 2. Focal moderate proximal left M1 stenosis. 3. Somewhat long segment moderate proximal right P2 stenosis. 4. Mild atherosclerotic change elsewhere within the intracranial circulation. No other high-grade or correctable stenosis. Electronically Signed   By: Jeannine Boga M.D.   On: 04/30/2018 22:19   Vas US Carotid  Result Date: 05/01/2018 Carotid Arterial Duplex Study Indications:       TIA. Comparison Study:  No prior study available Performing Technologist: Rudell Cobb  Examination Guidelines: A complete evaluation includes B-mode imaging, spectral Doppler, color Doppler, and power Doppler as needed of all accessible portions of each vessel. Bilateral testing is considered an  integral part of a complete examination. Limited examinations for reoccurring indications may be performed as noted.  Right Carotid Findings: +----------+--------+--------+--------+--------------------+--------+           PSV cm/sEDV cm/sStenosisDescribe  Comments +----------+--------+--------+--------+--------------------+--------+ CCA Prox  66      9                                   tortuous +----------+--------+--------+--------+--------------------+--------+ CCA Mid   92      14              diffuse and calcific         +----------+--------+--------+--------+--------------------+--------+ CCA Distal118     22              diffuse and calcific         +----------+--------+--------+--------+--------------------+--------+ ICA Prox  101     26      1-39%   focal and calcific           +----------+--------+--------+--------+--------------------+--------+ ICA Mid   96      28                                           +----------+--------+--------+--------+--------------------+--------+ ICA Distal94      26                                           +----------+--------+--------+--------+--------------------+--------+ ECA       157     16                                           +----------+--------+--------+--------+--------------------+--------+ +----------+--------+-------+-------------+-------------------+           PSV cm/sEDV cmsDescribe     Arm Pressure (mmHG) +----------+--------+-------+-------------+-------------------+ Subclavian140            calcification                    +----------+--------+-------+-------------+-------------------+ +---------+--------+--+--------+--+---------+ VertebralPSV cm/s42EDV cm/s11Antegrade +---------+--------+--+--------+--+---------+  Left Carotid Findings: +----------+--------+--------+--------+--------------------+--------+           PSV cm/sEDV cm/sStenosisDescribe             Comments +----------+--------+--------+--------+--------------------+--------+ CCA Prox  90      19                                           +----------+--------+--------+--------+--------------------+--------+ CCA Mid   91      19              focal and calcific           +----------+--------+--------+--------+--------------------+--------+ CCA Distal93      14              diffuse and calcific         +----------+--------+--------+--------+--------------------+--------+ ICA Prox  120     24      1-39%   focal and calcific           +----------+--------+--------+--------+--------------------+--------+ ICA Mid   97      21                                           +----------+--------+--------+--------+--------------------+--------+  ICA Distal72      14                                           +----------+--------+--------+--------+--------------------+--------+ ECA       88                                                   +----------+--------+--------+--------+--------------------+--------+ +----------+--------+--------+--------+-------------------+ SubclavianPSV cm/sEDV cm/sDescribeArm Pressure (mmHG) +----------+--------+--------+--------+-------------------+           139                                         +----------+--------+--------+--------+-------------------+ +---------+--------+--+--------+----------------------------+ VertebralPSV cm/s26EDV cm/sHigh resistant and Antegrade +---------+--------+--+--------+----------------------------+  Summary: Right Carotid: Velocities in the right ICA are consistent with a 1-39% stenosis. Left Carotid: Velocities in the left ICA are consistent with a 1-39% stenosis. Vertebrals: Bilateral vertebral arteries demonstrate antegrade flow. Left             vertebral artery demonstrates high resistant flow. *See table(s) above for measurements and observations.  Electronically signed by Servando Snare  MD on 05/01/2018 at 2:41:59 PM.    Final     Medications:   . amLODipine  2.5 mg Oral Daily  . aspirin EC  81 mg Oral Daily  . atorvastatin  20 mg Oral Daily  . brimonidine  1 drop Left Eye BID   And  . timolol  1 drop Left Eye BID  . cloNIDine  0.05 mg Oral BID  . dorzolamide  1 drop Left Eye BID  . enoxaparin (LOVENOX) injection  30 mg Subcutaneous Q24H  . finasteride  5 mg Oral Daily  . labetalol  100 mg Oral BID  . Netarsudil-Latanoprost  1 drop Left Eye QHS   Continuous Infusions: None   LOS: 0 days   Symphoni Helbling A Oluwanifemi Petitti  [facility]   *Please refer to [email or web address], password [email or web address] to get updated schedule on who will round on this patient, as hospitalists switch teams weekly. If 7PM-7AM, please contact night-coverage at [email or web address], password [email or web address] for any overnight needs.

## 2018-05-01 NOTE — Evaluation (Signed)
Occupational Therapy Evaluation Patient Details Name: Adam [name] MRN: [id number] DOB: 1931-03-26 Today's Date: 05/01/2018    History of Present Illness 85 y.o. male with medical history significant of, hyperlipidemia, GERD, type 2 diabetes, BPH presenting to the hospital for evaluation of difficulty walking and confusion.   Clinical Impression   Pt admitted with the above diagnoses and presents with below problem list. Pt will benefit from continued acute OT to address the below listed deficits and maximize independence with basic ADLs prior to d/c to venue below. PTA pt was independent with ADLs. Pt presents with acute decrease in dynamic standing balance. Pt is currently min guard with LB ADLs and toilet/shower transfers. Daughter present throughout session. Of note, pt reporting and appearing fatigued at end of OT session. BP assessed and was 203/69, HR 63. Pt also reporting headache which he states has been going on for 2 days. Nursing notified.      Follow Up Recommendations  Home health OT    Equipment Recommendations  None recommended by OT    Recommendations for Other Services PT consult     Precautions / Restrictions Precautions Precautions: Fall Restrictions Weight Bearing Restrictions: No      Mobility Bed Mobility Overal bed mobility: Needs Assistance Bed Mobility: Supine to Sit;Sit to Supine     Supine to sit: Supervision Sit to supine: Supervision      Transfers Overall transfer level: Needs assistance Equipment used: Rolling walker (2 wheeled) Transfers: Sit to/from Stand Sit to Stand: Min guard         General transfer comment: steadying assist. using grab bar to powerup from toilet.    Balance Overall balance assessment: Needs assistance Sitting-balance support: No upper extremity supported;Feet supported Sitting balance-Leahy Scale: Fair     Standing balance support: During functional activity;Single extremity supported Standing  balance-Leahy Scale: Poor Standing balance comment: fair static, poor dynamic. seeks external support during dynamic tasks                           ADL either performed or assessed with clinical judgement   ADL Overall ADL's : Needs assistance/impaired Eating/Feeding: Set up;Sitting   Grooming: Min guard;Oral care;Standing;Supervision/safety   Upper Body Bathing: Set up;Sitting   Lower Body Bathing: Min guard;Sit to/from stand   Upper Body Dressing : Set up;Sitting   Lower Body Dressing: Min guard;Sit to/from stand   Toilet Transfer: Min guard;Ambulation;Regular Toilet;Grab bars   Toileting- Clothing Manipulation and Hygiene: Min guard;Sit to/from stand   Tub/ Shower Transfer: Walk-in shower;Min guard;Ambulation;Grab bars Tub/Shower Transfer Details (indicate cue type and reason): hesitant movements possibly due to baseline visual deficits? Functional mobility during ADLs: Min guard General ADL Comments: Pt completed in-room functional mobility, toilet transfer, walk-in shower transfer, and standing at sink for grooming tasks. Pt fatigued at end of session, bp 203/69, reporting headache. Pt observed self-adminstering eye drops with nurse providing setup and supervision.     Vision Baseline Vision/History: Glaucoma(left eye)       Perception     Praxis      Pertinent Vitals/Pain Pain Assessment: Faces Faces Pain Scale: Hurts little more Pain Location: reporting headache at end of session. stated this has been going on for 2 days Pain Descriptors / Indicators: Aching Pain Intervention(s): Monitored during session     Hand Dominance     Extremity/Trunk Assessment Upper Extremity Assessment Upper Extremity Assessment: Generalized weakness;Overall [facility] for tasks assessed   Lower Extremity Assessment Lower Extremity Assessment:  Defer to PT evaluation       Communication Communication Communication: HOH(possible word finding? called this male therapist  "Elta Guadeloupe" )   Cognition Arousal/Alertness: Awake/alert Behavior During Therapy: WFL for tasks assessed/performed Overall Cognitive Status: Within Functional Limits for tasks assessed                                 General Comments: some slow processing noted at times   General Comments  Daughter present throughout session.    Exercises     Shoulder Instructions      Home Living Family/patient expects to be discharged to:: Private residence Living Arrangements: Alone Available Help at Discharge: Family(daughter lives in [place]) Type of Home: Independent living facility Home Access: Level entry     [place]: One level     Bathroom Shower/Tub: Walk-in Corporate treasurer Toilet: Handicapped height     Home Equipment: Environmental consultant - 4 wheels;Walker - 2 wheels;Shower seat;Grab bars - tub/shower          Prior Functioning/Environment Level of Independence: Independent        Comments: baseline balance deficits, no h/o reported falls. active.        OT Problem List: Decreased activity tolerance;Impaired balance (sitting and/or standing);Decreased safety awareness;Decreased knowledge of use of DME or AE;Decreased knowledge of precautions;Cardiopulmonary status limiting activity;Pain      OT Treatment/Interventions: Self-care/ADL training;Energy conservation;DME and/or AE instruction;Therapeutic activities;Patient/family education;Balance training    OT Goals(Current goals can be found in the care plan section) Acute Rehab OT Goals Patient Stated Goal: walk better OT Goal Formulation: With patient/family Time For Goal Achievement: 05/15/18 Potential to Achieve Goals: Good ADL Goals Pt Will Perform Grooming: with modified independence;standing Pt Will Perform Lower Body Bathing: with modified independence;sit to/from stand Pt Will Perform Lower Body Dressing: with modified independence;sit to/from stand Pt Will Transfer to Toilet: with modified  independence;ambulating Pt Will Perform Toileting - Clothing Manipulation and hygiene: with modified independence;sit to/from stand Pt Will Perform Tub/Shower Transfer: Shower transfer;with modified independence;ambulating;shower seat  OT Frequency: Min 2X/week   Barriers to D/C:            Co-evaluation              AM-PAC OT "6 Clicks" Daily Activity     Outcome Measure Help from another person eating meals?: None Help from another person taking care of personal grooming?: None Help from another person toileting, which includes using toliet, bedpan, or urinal?: A Little Help from another person bathing (including washing, rinsing, drying)?: A Little Help from another person to put on and taking off regular upper body clothing?: None Help from another person to put on and taking off regular lower body clothing?: A Little 6 Click Score: 21   End of Session Nurse Communication: Other (comment)(BP 203/69 at end of session, reporting ongoing headache)  Activity Tolerance: Patient limited by fatigue(fatigued at end of session) Patient left: in bed;with call bell/phone within reach;with bed alarm set;with family/visitor present  OT Visit Diagnosis: Unsteadiness on feet (R26.81);Muscle weakness (generalized) (M62.81);Pain;Cognitive communication deficit (G29.528)                Time: 4132-4401 OT Time Calculation (min): 25 min Charges:  OT General Charges $OT Visit: 1 Visit OT Evaluation $OT Eval Low Complexity: 1 Low OT Treatments $Self Care/Home Management : 8-22 mins  Tyrone Schimke, OT Acute Rehabilitation Services Pager: [phone] Office: [phone]  Tyrone Schimke  H 05/01/2018, 10:04 AM

## 2018-05-01 NOTE — Progress Notes (Signed)
Bilateral carotid duplex exam completed. Please see preliminary notes on CV PROC under chart review. Joana Nolton H Ralyn Stlaurent(RDMS RVT) 05/01/18 2:09 PM

## 2018-05-02 ENCOUNTER — Other Ambulatory Visit: Payer: Self-pay | Admitting: Physician Assistant

## 2018-05-02 ENCOUNTER — Other Ambulatory Visit: Payer: Self-pay | Admitting: *Deleted

## 2018-05-02 DIAGNOSIS — E785 Hyperlipidemia, unspecified: Secondary | ICD-10-CM | POA: Diagnosis not present

## 2018-05-02 DIAGNOSIS — E119 Type 2 diabetes mellitus without complications: Secondary | ICD-10-CM

## 2018-05-02 DIAGNOSIS — N400 Enlarged prostate without lower urinary tract symptoms: Secondary | ICD-10-CM | POA: Diagnosis not present

## 2018-05-02 DIAGNOSIS — R482 Apraxia: Secondary | ICD-10-CM | POA: Diagnosis not present

## 2018-05-02 DIAGNOSIS — G459 Transient cerebral ischemic attack, unspecified: Secondary | ICD-10-CM | POA: Diagnosis not present

## 2018-05-02 DIAGNOSIS — I674 Hypertensive encephalopathy: Secondary | ICD-10-CM

## 2018-05-02 DIAGNOSIS — R55 Syncope and collapse: Secondary | ICD-10-CM

## 2018-05-02 DIAGNOSIS — I10 Essential (primary) hypertension: Secondary | ICD-10-CM

## 2018-05-02 LAB — BASIC METABOLIC PANEL
Anion gap: 10 (ref 5–15)
BUN: 30 mg/dL — ABNORMAL HIGH (ref 8–23)
CO2: 20 mmol/L — ABNORMAL LOW (ref 22–32)
Calcium: 9 mg/dL (ref 8.9–10.3)
Chloride: 109 mmol/L (ref 98–111)
Creatinine, Ser: 1.69 mg/dL — ABNORMAL HIGH (ref 0.61–1.24)
GFR calc Af Amer: 42 mL/min — ABNORMAL LOW (ref >60)
GFR calc non Af Amer: 36 mL/min — ABNORMAL LOW (ref >60)
Glucose, Bld: 113 mg/dL — ABNORMAL HIGH (ref 70–99)
Potassium: 4.1 mmol/L (ref 3.5–5.1)
Sodium: 139 mmol/L (ref 135–145)

## 2018-05-02 LAB — GLUCOSE, CAPILLARY
Glucose-Capillary: 107 mg/dL — ABNORMAL HIGH (ref 70–99)
Glucose-Capillary: 127 mg/dL — ABNORMAL HIGH (ref 70–99)

## 2018-05-02 MED ORDER — CLONIDINE HCL 0.1 MG PO TABS: 0.1000 mg | ORAL_TABLET | Freq: Two times a day (BID) | ORAL | 0 refills | Status: DC

## 2018-05-02 MED ORDER — ASPIRIN EC 325 MG PO TBEC: 325.0000 mg | DELAYED_RELEASE_TABLET | Freq: Every day | ORAL | Status: DC

## 2018-05-02 MED ORDER — AMLODIPINE BESYLATE 5 MG PO TABS: 5.0000 mg | ORAL_TABLET | Freq: Every day | ORAL | 0 refills | Status: DC

## 2018-05-02 MED ORDER — ATORVASTATIN CALCIUM 20 MG PO TABS: 20.0000 mg | ORAL_TABLET | Freq: Every day | ORAL | 0 refills | Status: DC

## 2018-05-02 MED ORDER — ASPIRIN 325 MG PO TBEC: 325.0000 mg | DELAYED_RELEASE_TABLET | Freq: Every day | ORAL | 0 refills | Status: DC

## 2018-05-02 MED ORDER — AMLODIPINE BESYLATE 5 MG PO TABS
5.0000 mg | ORAL_TABLET | Freq: Every day | ORAL | Status: DC
  Administered 2018-05-02: 5 mg via ORAL
  Filled 2018-05-02: qty 1

## 2018-05-02 MED ORDER — CLONIDINE HCL 0.1 MG PO TABS
0.1000 mg | ORAL_TABLET | Freq: Two times a day (BID) | ORAL | Status: DC
  Administered 2018-05-02: 0.1 mg via ORAL
  Filled 2018-05-02: qty 1

## 2018-05-02 MED ORDER — ATORVASTATIN CALCIUM 20 MG PO TABS: 20.0000 mg | ORAL_TABLET | Freq: Every day | ORAL | 0 refills | Status: AC

## 2018-05-02 NOTE — Discharge Summary (Signed)
Adam [name], is a 85 y.o. male  DOB Apr 07, 1930  MRN [id number].  Admission date:  04/30/2018  Admitting Physician  Shela Leff, MD  Discharge Date:  05/02/2018   Primary MD  Javier Glazier, MD  Recommendations for primary care physician for things to follow:    Discharge Diagnosis  TIA (transient ischemic attack) [G45.9] Gait apraxia [R48.2]    Principal Problem:   Apraxia Active Problems:   Confusion   HTN (hypertension)   HLD (hyperlipidemia)   Type 2 diabetes mellitus ([place])   BPH (benign prostatic hyperplasia)      Past Medical History:  Diagnosis Date  . Acute meniscal tear of knee RIGHT KNEE  . Arthritis   . BPH (benign prostatic hypertrophy)   . Colon polyps    hyperplastic  . Diabetes mellitus type 2, diet-controlled (HCC) AND EXERCISE-- LAST A1C  5.6  . Diverticulosis   . Gait abnormality 11/08/2016  . GERD (gastroesophageal reflux disease)   . Glaucoma   . H/O hiatal hernia   . Heart murmur   . History of esophageal stricture S/P DILATION IN 2007  . Hyperlipidemia   . Hypertension   . IgM monoclonal gammopathy of uncertain significance ASYMPTOMATIC    FOLLOWED BY DR Alen Blew  . Internal hemorrhoids   . Macrocytic anemia DUE TO CHRONIC RENAL INSUFF.  . Nocturia   . Right knee pain     Past Surgical History:  Procedure Laterality Date  . CATARACT EXTRACTION W/ INTRAOCULAR LENS  IMPLANT, BILATERAL  2012  . HEMORRHOID SURGERY    . HERNIA REPAIR    . KNEE ARTHROSCOPY  12/25/2011   Procedure: ARTHROSCOPY KNEE;  Surgeon: Tobi Bastos, MD;  Location: [facility];  Service: Orthopedics;  Laterality: Right;  right knee arthroscopy with medial menisectomy         HPI  from the history and physical done on the day of admission:     Adam [name] is a 85 y.o. male with medical  history significant of, hyperlipidemia, GERD, type 2 diabetes, BPH presenting to the hospital for evaluation of difficulty walking.  Patient's daughter states that yesterday around noon patient forgot about a meeting.  At lunchtime, he grabbed a can opener but did not know what to do with it.  States by dinnertime he was back to baseline but was complaining of a headache.  Patient states this morning he went to exercise at 9 AM and after returning back to his apartment he noticed difficulty walking which caused him to fall against the wall even while using a walker.  States he caught himself and did not actually fall on the floor.  No injury to his head or any other injuries sustained from this incident.  States he took 2 regular aspirins at home today. Reports compliance with his home blood pressure medications.   Hospital Course:   1. Apraxia/AMS: Resolved.  Patient was evaluated by neurology.  Initial CT scan of the brain showed no acute abnormality mild chronic small vessel  disease.  Neurology recommended completion of stroke work-up.  He reports taking blood pressure medications as prescribed.  MRI/MRA of the brain showed no acute signs of a stroke, but did show some focal moderate proximal left M1 stenosis with moderate proximal right P2 stenosis.  Blood pressures noted to be significantly elevated, but no signs of PRES. Neurology concern for hypertensive encephalopathy and less likely TIA.  Patient was continued on statin and aspirin. Physical therapy recommending home PT which was arranged prior to discharge. Message sent to Gay Filler to assist in setting patient up for a 30-day event monitor as there may be concern for atrial fibrillation given left atrial enlargement as seen on echocardiogram per neurology  2. Essential hypertension with hypertensive urgency: On admission patient was allowed to have permissive hypertension up to 220/120 in the setting of suspected stroke.  Patient missed 2  doses of his regular home medications of labetalol, clonidine, and amlodipine.  Suspected rebound hypertension initially.  However, patient noted to have persistent systolic blood pressures into the 190s despite restarting home medications.  Case was discussed with Dr. [name] of cardiology per family request.  Agreed on increasing clonidine from 0.05 mg twice daily to 0.1 mg twice daily along with increasing amlodipine from 2.5 mg daily to 5 mg daily.  Patient will follow-up with Dr. [name] in her office sometime next week.  Encourage patient to do blood pressure at least 1-2 daily and keep a log to bring to his appointment.  3. Acute kidney injury superimposed on chronic kidney disease stage III: Patient with a baseline creatinine appears to be somewhere around 1.6.  Admission creatinine of 2.12 improved to 1.69 prior to discharge with gentle IV fluids.  Patient advised of the need to keep himself adequately hydrated. Will follow-up monitoring of kidney function in outpatient setting  4.  HFpEf: Echocardiogram revealed EF of 60-65%  Diabetes mellitus type 2: Patient not on any diabetic medications at this time.  Hemoglobin A1c noted to be 6.  Dyslipidemia: Total cholesterol 122, HDL 24, LDL 30, triglycerides 342, VLDL 68.  Patient taking atorvastatin 40 mg at home. Decreased dose of atorvastatin to 20 mg due to low LDL levels.  BPH, without symptoms: Continue finasteride.     Follow UP  Follow-up Information    [facility] Follow up in 4 week(s).   Specialty:  Neurology Why:  stroke clinic. office will call wtih appt date and time Contact information: [place] [phone]           Consults obtained:  Dr. Erlinda Hong of neurology  Discussed patient's case with Dr. Dorris Carnes of cardiology who will set up appointment next week to manage blood pressure and agree with recent changes.  Discharge Condition: Stable Disposition:  [facility] assisted living facility  Diet and Activity recommendation: See Discharge Instructions below  Discharge Instructions    Ambulatory referral to Neurology   Complete by:  As directed    Follow up with stroke clinic NP (Jessica Vanschaick or Cecille Rubin, if both not available, consider Dr. Antony Contras, Dr. Bess Harvest, or Dr. Sarina Ill) at [facility] in about 4 weeks.   Discharge instructions   Complete by:  As directed    Follow with Primary MD Javier Glazier, MD in 7 days.  Please follow-up with Dr. [name] of cardiology next week.  Please check blood pressure once or twice daily and take log with you to your visit.  Get CBC and BMP-  checked  by Primary MD or SNF MD in 5-7 days ( we routinely change or add medications that can affect your baseline labs and fluid status, therefore we recommend that you get the mentioned basic workup next visit with your PCP, your PCP may decide not to get them or add new tests based on their clinical decision)  Activity: As tolerated with fall precautions use walker/cane & assistance as needed - Home health with physical therapy already arranged  Diet: Heart healthy carb modified diet        For Heart failure patients - Check your Weight same time everyday, if you gain over 2 pounds, or you develop in leg swelling, experience more shortness of breath or chest pain, call your Primary doctor immediately. Follow Cardiac Low Salt Diet and 1.5 lit/day fluid restriction.  Special Instructions: If you have smoked or chewed Tobacco  in the last 2 yrs please stop smoking, stop any regular Alcohol  and or any Recreational drug use.  On your next visit with your primary care physician please Get Medicines reviewed and adjusted.  Please request your Javier Glazier, MD to go over all Hospital Tests and Procedure/Radiological results at the follow up, please get all Hospital records sent to your Prim MD by signing hospital  release before you go home.  If you experience worsening of your admission symptoms, develop shortness of breath, life threatening emergency, suicidal or homicidal thoughts you must seek medical attention immediately by calling 911 or calling your MD immediately  if symptoms less severe.  You Must read complete instructions/literature along with all the possible adverse reactions/side effects for all the Medicines you take and that have been prescribed to you. Take any new Medicines after you have completely understood and accpet all the possible adverse reactions/side effects.   Do not drive, operate heavy machinery, perform activities at heights, swimming or participation in water activities or provide baby sitting services if your were admitted for syncope or siezures until you have seen by Primary MD or a Neurologist and advised to do so again.  Do not drive when taking Pain medications.  Do not take more than prescribed Pain, Sleep and Anxiety Medications  Wear Seat belts while driving.   Please note  You were cared for by a hospitalist during your hospital stay. If you have any questions about your discharge medications or the care you received while you were in the hospital after you are discharged, you can call the unit and asked to speak with the hospitalist on call if the hospitalist that took care of you is not available. Once you are discharged, your primary care physician will handle any further medical issues. Please note that NO REFILLS for any discharge medications will be authorized once you are discharged, as it is imperative that you return to your primary care physician (or establish a relationship with a primary care physician if you do not have one) for your aftercare needs so that they can reassess your need for medications and monitor your lab values.        Discharge Medications     Allergies as of 05/02/2018   No Known Allergies     Medication List    STOP taking  these medications   acetaminophen 500 MG tablet Commonly known as:  TYLENOL     TAKE these medications   amLODipine 5 MG tablet Commonly known as:  NORVASC Take 1 tablet (5 mg total) by mouth  daily. Start taking on:  May 03, 2018 What changed:    medication strength  how much to take  additional instructions   aspirin 325 MG EC tablet Take 1 tablet (325 mg total) by mouth daily. Start taking on:  May 03, 2018   atorvastatin 20 MG tablet Commonly known as:  LIPITOR Take 1 tablet (20 mg total) by mouth daily. Start taking on:  May 03, 2018 What changed:    medication strength  how much to take   clobetasol cream 0.05 % Commonly known as:  TEMOVATE Apply 1 application topically every evening.   cloNIDine 0.1 MG tablet Commonly known as:  CATAPRES Take 1 tablet (0.1 mg total) by mouth 2 (two) times daily. What changed:  how much to take   COMBIGAN 0.2-0.5 % ophthalmic solution Generic drug:  brimonidine-timolol Place 1 drop into the left eye 2 (two) times daily.   DORZOLAMIDE HCL-TIMOLOL MAL OP Place 1 drop into the left eye 2 (two) times daily.   finasteride 5 MG tablet Commonly known as:  PROSCAR Take 5 mg by mouth daily.   labetalol 100 MG tablet Commonly known as:  NORMODYNE Take 100 mg by mouth 2 (two) times daily.   nystatin-triamcinolone cream Commonly known as:  MYCOLOG II Apply 1 application topically as needed (skin raah).   PRESERVISION AREDS PO Take 1 tablet by mouth 2 (two) times daily.   ROCKLATAN 0.02-0.005 % Soln Generic drug:  Netarsudil-Latanoprost Place 1 drop into the left eye at bedtime.   SODIUM BICARBONATE PO Take 15 g by mouth 2 (two) times daily.   ULORIC 40 MG tablet Generic drug:  febuxostat Take 40 mg by mouth 3 (three) times a week. Take 1 tablet by mouth on Tuesday, Friday, and Sunday   vitamin C 500 MG tablet Commonly known as:  ASCORBIC ACID Take 500 mg by mouth daily.   Vitamin D (Ergocalciferol)  1.25 MG (50000 UT) Caps capsule Commonly known as:  DRISDOL Take 1 capsule (50,000 Units total) by mouth every 7 (seven) days. What changed:  when to take this       Major procedures and Radiology Reports - PLEASE review detailed and final reports for all details, in brief -   Echocardiogram impression 05/01/2018:             1. The left ventricle has normal systolic function of 40-97%. The cavity size is severely increased. There is no left ventricular wall thickness. Echo evidence of pseudonormal diastolic filling patterns. Elevated left ventricular end-diastolic pressure.             2. Grade 2 diastolic dysfunction.             3. Severely dilated left atrial size.             4. Normal right atrial size.             5. Normal tricuspid valve.             6. No atrial level shunt detected by color flow Doppler.    Dg Lumbar Spine Complete  Result Date: 04/21/2018 CLINICAL DATA:  Chronic intermittent low back pain for the past 2 years with some paresthesias radiating to the foot. No known injury. EXAM: LUMBAR SPINE - COMPLETE 4+ VIEW COMPARISON:  None. FINDINGS: There are 5 non rib-bearing lumbar type vertebral bodies. There is grade 1 anterolisthesis of L5 upon S1 measuring approximately 6 mm. No definite associated pars defects. Age-indeterminate mild (approximately 25%)  compression deformity involving the superior endplate of the Z61 and T11 vertebral bodies. Remaining lumbar vertebral body heights appear preserved. Mild to moderate multilevel lumbar spine DDD, worse at L4-L5 with disc space height loss, endplate irregularity and sclerosis. Atherosclerotic plaque within the abdominal aorta. Large colonic stool burden without evidence of enteric obstruction. IMPRESSION: 1. Age-indeterminate mild (approximately 25%) T10 and T11 vertebral body compression deformities - correlation for point tenderness at these locations is advised. 2. Mild-to-moderate multilevel lumbar spine DDD.  Electronically Signed   By: Sandi Mariscal M.D.   On: 04/21/2018 16:24   Ct Head Wo Contrast  Result Date: 04/30/2018 CLINICAL DATA:  TIA. Transient speech disturbance and left leg weakness. EXAM: CT HEAD WITHOUT CONTRAST TECHNIQUE: Contiguous axial images were obtained from the base of the skull through the vertex without intravenous contrast. COMPARISON:  06/30/2016 brain MRI and 06/28/2016 CT FINDINGS: Brain: There is no evidence of acute infarct, intracranial hemorrhage, mass, midline shift, or extra-axial fluid collection. Moderate ventriculomegaly is unchanged and may represent central predominant cerebral atrophy or possibly normal pressure hydrocephalus in the appropriate clinical setting. A small chronic right cerebellar infarct is unchanged. Periventricular white matter hypodensities are nonspecific but compatible with mild chronic small vessel ischemic disease. Vascular: Calcified atherosclerosis at the skull base. No hyperdense vessel. Skull: No fracture or focal osseous lesion. Sinuses/Orbits: Paranasal sinuses and mastoid air cells are clear. Bilateral cataract extraction. Other: None. IMPRESSION: 1. No evidence of acute intracranial abnormality. 2. Mild chronic small vessel ischemic disease and unchanged ventriculomegaly. Electronically Signed   By: Logan Bores M.D.   On: 04/30/2018 17:45   Mr Brain Wo Contrast  Result Date: 04/30/2018 CLINICAL DATA:  Initial evaluation for acute altered mental status, difficulty walking. EXAM: MRI HEAD WITHOUT CONTRAST MRA HEAD WITHOUT CONTRAST TECHNIQUE: Multiplanar, multiecho pulse sequences of the brain and surrounding structures were obtained without intravenous contrast. Angiographic images of the head were obtained using MRA technique without contrast. COMPARISON:  Prior CT from earlier the same day as well as previous MRI from 06/30/2016. FINDINGS: MRI HEAD FINDINGS Brain: Generalized age-related cerebral atrophy with mild chronic small vessel ischemic  disease. Single small remote right cerebellar infarct noted. No abnormal foci of restricted diffusion to suggest acute or subacute ischemia. Gray-white matter differentiation otherwise maintained. No other is of chronic infarction. No evidence for acute or chronic intracranial hemorrhage. No mass lesion, midline shift or mass effect. Diffuse ventricular prominence related global parenchymal volume loss without hydrocephalus. No extra-axial fluid collection. Pituitary gland within normal limits. Vascular: Major intracranial vascular flow voids are maintained. Skull and upper cervical spine: Craniocervical junction normal. Bone marrow signal intensity within normal limits. No scalp soft tissue abnormality. Sinuses/Orbits: Patient status post bilateral ocular lens replacement. Paranasal sinuses are largely clear. Trace left mastoid effusion, of doubtful significance. Inner ear structures grossly normal. Other: None. MRA HEAD FINDINGS ANTERIOR CIRCULATION: Visualized distal cervical segments of the internal carotid arteries are patent with symmetric antegrade flow. Petrous segments widely patent bilaterally. Mild atheromatous irregularity throughout the carotid siphons without hemodynamically significant stenosis. A1 segments widely patent. Normal anterior communicating artery. Anterior cerebral arteries mildly irregular but patent to their distal aspects without high-grade stenosis. Mild atheromatous irregularity within the right M1 segment without significant stenosis. Normal right MCA bifurcation. Distal right MCA branches well perfused and patent. Focal moderate stenosis at the origin of the left M1 segment (series 7, image 94). Left M1 otherwise patent. Normal left MCA bifurcation. Focal moderate proximal left M2 stenosis, superior division noted (  series 1034, image 10). Left MCA branches otherwise well perfused distally. POSTERIOR CIRCULATION: Visualized vertebral arteries patent to the vertebrobasilar junction  without stenosis. Right vertebral artery dominant. Patent right PICA. Left PICA not seen. Dominant left AICA. Basilar patent to its distal aspect without stenosis. Superior cerebral arteries patent bilaterally. Predominant fetal type origin of the right PCA supplied via a robust right posterior communicating artery. Hypoplastic left P1 segment with prominent left posterior communicating artery. Focal moderate somewhat long segment proximal right P2 stenosis. Scattered atheromatous irregularity elsewhere within the PCAs without high-grade stenosis. No intracranial aneurysm. IMPRESSION: MRI HEAD IMPRESSION: 1. No acute intracranial abnormality. 2. Single small remote right cerebellar infarct. 3. Underlying age-related cerebral atrophy with mild chronic small vessel ischemic disease. MRA HEAD IMPRESSION: 1. Patent intracranial arterial vasculature without large vessel occlusion. 2. Focal moderate proximal left M1 stenosis. 3. Somewhat long segment moderate proximal right P2 stenosis. 4. Mild atherosclerotic change elsewhere within the intracranial circulation. No other high-grade or correctable stenosis. Electronically Signed   By: Jeannine Boga M.D.   On: 04/30/2018 22:19   Mr Virgel Paling KZ Contrast  Result Date: 04/30/2018 CLINICAL DATA:  Initial evaluation for acute altered mental status, difficulty walking. EXAM: MRI HEAD WITHOUT CONTRAST MRA HEAD WITHOUT CONTRAST TECHNIQUE: Multiplanar, multiecho pulse sequences of the brain and surrounding structures were obtained without intravenous contrast. Angiographic images of the head were obtained using MRA technique without contrast. COMPARISON:  Prior CT from earlier the same day as well as previous MRI from 06/30/2016. FINDINGS: MRI HEAD FINDINGS Brain: Generalized age-related cerebral atrophy with mild chronic small vessel ischemic disease. Single small remote right cerebellar infarct noted. No abnormal foci of restricted diffusion to suggest acute or  subacute ischemia. Gray-white matter differentiation otherwise maintained. No other is of chronic infarction. No evidence for acute or chronic intracranial hemorrhage. No mass lesion, midline shift or mass effect. Diffuse ventricular prominence related global parenchymal volume loss without hydrocephalus. No extra-axial fluid collection. Pituitary gland within normal limits. Vascular: Major intracranial vascular flow voids are maintained. Skull and upper cervical spine: Craniocervical junction normal. Bone marrow signal intensity within normal limits. No scalp soft tissue abnormality. Sinuses/Orbits: Patient status post bilateral ocular lens replacement. Paranasal sinuses are largely clear. Trace left mastoid effusion, of doubtful significance. Inner ear structures grossly normal. Other: None. MRA HEAD FINDINGS ANTERIOR CIRCULATION: Visualized distal cervical segments of the internal carotid arteries are patent with symmetric antegrade flow. Petrous segments widely patent bilaterally. Mild atheromatous irregularity throughout the carotid siphons without hemodynamically significant stenosis. A1 segments widely patent. Normal anterior communicating artery. Anterior cerebral arteries mildly irregular but patent to their distal aspects without high-grade stenosis. Mild atheromatous irregularity within the right M1 segment without significant stenosis. Normal right MCA bifurcation. Distal right MCA branches well perfused and patent. Focal moderate stenosis at the origin of the left M1 segment (series 7, image 94). Left M1 otherwise patent. Normal left MCA bifurcation. Focal moderate proximal left M2 stenosis, superior division noted (series 1034, image 10). Left MCA branches otherwise well perfused distally. POSTERIOR CIRCULATION: Visualized vertebral arteries patent to the vertebrobasilar junction without stenosis. Right vertebral artery dominant. Patent right PICA. Left PICA not seen. Dominant left AICA. Basilar patent  to its distal aspect without stenosis. Superior cerebral arteries patent bilaterally. Predominant fetal type origin of the right PCA supplied via a robust right posterior communicating artery. Hypoplastic left P1 segment with prominent left posterior communicating artery. Focal moderate somewhat long segment proximal right P2 stenosis. Scattered atheromatous irregularity elsewhere  within the PCAs without high-grade stenosis. No intracranial aneurysm. IMPRESSION: MRI HEAD IMPRESSION: 1. No acute intracranial abnormality. 2. Single small remote right cerebellar infarct. 3. Underlying age-related cerebral atrophy with mild chronic small vessel ischemic disease. MRA HEAD IMPRESSION: 1. Patent intracranial arterial vasculature without large vessel occlusion. 2. Focal moderate proximal left M1 stenosis. 3. Somewhat long segment moderate proximal right P2 stenosis. 4. Mild atherosclerotic change elsewhere within the intracranial circulation. No other high-grade or correctable stenosis. Electronically Signed   By: Jeannine Boga M.D.   On: 04/30/2018 22:19   Vas US Carotid  Result Date: 05/01/2018 Carotid Arterial Duplex Study Indications:       TIA. Comparison Study:  No prior study available Performing Technologist: Rudell Cobb  Examination Guidelines: A complete evaluation includes B-mode imaging, spectral Doppler, color Doppler, and power Doppler as needed of all accessible portions of each vessel. Bilateral testing is considered an integral part of a complete examination. Limited examinations for reoccurring indications may be performed as noted.  Right Carotid Findings: +----------+--------+--------+--------+--------------------+--------+           PSV cm/sEDV cm/sStenosisDescribe            Comments +----------+--------+--------+--------+--------------------+--------+ CCA Prox  66      9                                   tortuous  +----------+--------+--------+--------+--------------------+--------+ CCA Mid   92      14              diffuse and calcific         +----------+--------+--------+--------+--------------------+--------+ CCA Distal118     22              diffuse and calcific         +----------+--------+--------+--------+--------------------+--------+ ICA Prox  101     26      1-39%   focal and calcific           +----------+--------+--------+--------+--------------------+--------+ ICA Mid   96      28                                           +----------+--------+--------+--------+--------------------+--------+ ICA Distal94      26                                           +----------+--------+--------+--------+--------------------+--------+ ECA       157     16                                           +----------+--------+--------+--------+--------------------+--------+ +----------+--------+-------+-------------+-------------------+           PSV cm/sEDV cmsDescribe     Arm Pressure (mmHG) +----------+--------+-------+-------------+-------------------+ Subclavian140            calcification                    +----------+--------+-------+-------------+-------------------+ +---------+--------+--+--------+--+---------+ VertebralPSV cm/s42EDV cm/s11Antegrade +---------+--------+--+--------+--+---------+  Left Carotid Findings: +----------+--------+--------+--------+--------------------+--------+           PSV cm/sEDV cm/sStenosisDescribe            Comments +----------+--------+--------+--------+--------------------+--------+  CCA Prox  90      19                                           +----------+--------+--------+--------+--------------------+--------+ CCA Mid   91      19              focal and calcific           +----------+--------+--------+--------+--------------------+--------+ CCA Distal93      14              diffuse and calcific          +----------+--------+--------+--------+--------------------+--------+ ICA Prox  120     24      1-39%   focal and calcific           +----------+--------+--------+--------+--------------------+--------+ ICA Mid   97      21                                           +----------+--------+--------+--------+--------------------+--------+ ICA Distal72      14                                           +----------+--------+--------+--------+--------------------+--------+ ECA       88                                                   +----------+--------+--------+--------+--------------------+--------+ +----------+--------+--------+--------+-------------------+ SubclavianPSV cm/sEDV cm/sDescribeArm Pressure (mmHG) +----------+--------+--------+--------+-------------------+           139                                         +----------+--------+--------+--------+-------------------+ +---------+--------+--+--------+----------------------------+ VertebralPSV cm/s26EDV cm/sHigh resistant and Antegrade +---------+--------+--+--------+----------------------------+  Summary: Right Carotid: Velocities in the right ICA are consistent with a 1-39% stenosis. Left Carotid: Velocities in the left ICA are consistent with a 1-39% stenosis. Vertebrals: Bilateral vertebral arteries demonstrate antegrade flow. Left             vertebral artery demonstrates high resistant flow. *See table(s) above for measurements and observations.  Electronically signed by Servando Snare MD on 05/01/2018 at 2:41:59 PM.    Final     Micro Results   No results found for this or any previous visit (from the past 240 hour(s)).     Today   Subjective    Adam [name] feels much improved and has had no recurrence of symptoms.   Objective   Blood pressure (!) 145/54, pulse 62, temperature 98.1 F (36.7 C), temperature source Oral, resp. rate 17, height 5\' 3"  (1.6 m), weight 67.6 kg, SpO2 99 %.  No  intake or output data in the 24 hours ending 05/02/18 1222  Exam  Constitutional: NAD, calm, comfortable Eyes: PERRL, lids and conjunctivae normal ENMT: Mucous membranes are moist. Posterior pharynx clear of any exudate or lesions.   Neck: normal, supple, no masses, no thyromegaly Respiratory: clear to auscultation bilaterally, no wheezing, no crackles.  Normal respiratory effort. No accessory muscle use.  Cardiovascular: Regular rate and rhythm, no rubs / gallops. No extremity edema. 2+ pedal pulses. No carotid bruits.  Abdomen: no tenderness, no masses palpated. No hepatosplenomegaly. Bowel sounds positive.  Musculoskeletal: no clubbing / cyanosis. No joint deformity upper and lower extremities. Good ROM, no contractures. Normal muscle tone.  Skin: no rashes, lesions, ulcers. No induration Neurologic: CN 2-12 grossly intact. Sensation intact. Strength 5/5 in all 4.  Psychiatric: Normal judgment and insight. Alert and oriented x 3. Normal mood.    Data Review   CBC w Diff:  Lab Results  Component Value Date   WBC 6.9 04/30/2018   HGB 10.7 (L) 04/30/2018   HGB 9.8 (L) 02/01/2012   HCT 33.0 (L) 04/30/2018   HCT 28.0 (L) 02/01/2012   PLT 156 04/30/2018   PLT 160 02/01/2012   LYMPHOPCT 26 04/30/2018   LYMPHOPCT 25.2 02/01/2012   MONOPCT 12 04/30/2018   MONOPCT 11.6 02/01/2012   EOSPCT 5 04/30/2018   EOSPCT 5.5 02/01/2012   BASOPCT 2 04/30/2018   BASOPCT 3.4 (H) 02/01/2012    CMP:  Lab Results  Component Value Date   NA 139 05/02/2018   NA 138 02/01/2012   K 4.1 05/02/2018   K 4.6 02/01/2012   CL 109 05/02/2018   CL 113 (H) 02/01/2012   CO2 20 (L) 05/02/2018   CO2 21 (L) 02/01/2012   BUN 30 (H) 05/02/2018   BUN 28.0 (H) 02/01/2012   CREATININE 1.69 (H) 05/02/2018   CREATININE 1.64 (H) 12/15/2015   CREATININE 1.4 (H) 02/01/2012   PROT 6.1 (L) 04/30/2018   PROT 6.4 02/01/2012   ALBUMIN 3.9 04/30/2018   ALBUMIN 3.8 02/01/2012   BILITOT 0.5 04/30/2018   BILITOT 0.61  02/01/2012   ALKPHOS 53 04/30/2018   ALKPHOS 68 02/01/2012   AST 18 04/30/2018   AST 39 (H) 02/01/2012   ALT 20 04/30/2018   ALT 56 (H) 02/01/2012  .   Total Time in preparing paper work, data evaluation and todays exam - 35 minutes  Norval Morton M.D on 05/02/2018 at 12:22 PM  [facility]   Office  [phone]

## 2018-05-02 NOTE — Plan of Care (Signed)
Patient stable, discussed POC with patient and daughter, agreeable with plan for D/C independent living facility. Tolerated BP medication well, denies question/concerns at this time.

## 2018-05-02 NOTE — Progress Notes (Signed)
D/C instructions provided to patient, denies questions/concerns at this time. Patient transported to front entrance via [place] per volunteer services, tol well.

## 2018-05-02 NOTE — Care Management Note (Signed)
Case Management Note  Patient Details  Name: Adam [name] MRN: [id number] Date of Birth: 10-Mar-1931  Subjective/Objective:    Pt in with apraxia. He is from [place].  DME: all needed but doesn't currently use any No issues obtaining his meds.  No issues with transportation.                Action/Plan: Pt discharging back to [facility]. Pt with orders for [facility] services. CM faxed the [facility] orders to [facility] and they will have their rehab services meet and work with him in his apartment. Daughter concerned about BP checks a the facility. Per [facility] they will check with therapy visits and he can go to the clinic imbetween therapy visits to have it checked.  Daughter to provide transport back to [place].  Expected Discharge Date:  05/02/18               Expected Discharge Plan:  [place]  In-House Referral:     Discharge planning Services  CM Consult  Post Acute Care Choice:  Home Health Choice offered to:     DME Arranged:    DME Agency:     HH Arranged:  PT, OT HH Agency:  [facility])  Status of Service:  Completed, signed off  If discussed at [place] of Stay Meetings, dates discussed:    Additional Comments:  Pollie Friar, RN 05/02/2018, 1:01 PM

## 2018-05-02 NOTE — Progress Notes (Signed)
30 day event monitor for syncope

## 2018-05-02 NOTE — Progress Notes (Addendum)
Physical Therapy Treatment Patient Details Name: Adam [name] MRN: [id number] DOB: 13-Aug-1930 Today's Date: 05/02/2018    History of Present Illness Pt is an 85 y.o. male with medical history significant of, hyperlipidemia, GERD, type 2 diabetes, BPH presenting to the hospital for evaluation of difficulty walking and confusion. MRI was negative for any acute findings.     PT Comments    Pt making steady progress with functional mobility. Pt would continue to benefit from skilled physical therapy services at this time while admitted and after d/c to address the below listed limitations in order to improve overall safety and independence with functional mobility.  BP at beginning of session (sitting): 160/58 mmHg BP at end of session (sitting): 166/65 mmHg    Follow Up Recommendations  Home health PT     Equipment Recommendations  None recommended by PT    Recommendations for Other Services       Precautions / Restrictions Precautions Precautions: Fall Restrictions Weight Bearing Restrictions: No    Mobility  Bed Mobility Overal bed mobility: Needs Assistance Bed Mobility: Supine to Sit     Supine to sit: Supervision     General bed mobility comments: supervision for safety  Transfers Overall transfer level: Needs assistance Equipment used: Rolling walker (2 wheeled) Transfers: Sit to/from Stand Sit to Stand: Supervision         General transfer comment: cueing for safe hand placement, supervision for safety  Ambulation/Gait Ambulation/Gait assistance: Min guard;Supervision Gait Distance (Feet): 100 Feet Assistive device: Rolling walker (2 wheeled) Gait Pattern/deviations: Step-through pattern;Decreased stride length Gait velocity: WFL   General Gait Details: pt with greatly improved stability, step length and gait pattern with use of RW, occasional cueing to maintain body within frame of RW   Stairs             Wheelchair Mobility    Modified  Rankin (Stroke Patients Only)       Balance Overall balance assessment: Needs assistance Sitting-balance support: Feet supported Sitting balance-Leahy Scale: Good     Standing balance support: No upper extremity supported Standing balance-Leahy Scale: Fair                              Cognition Arousal/Alertness: Awake/alert Behavior During Therapy: WFL for tasks assessed/performed Overall Cognitive Status: Impaired/Different from baseline Area of Impairment: Memory;Problem solving                     Memory: Decreased short-term memory       Problem Solving: Slow processing        Exercises General Exercises - Lower Extremity Ankle Circles/Pumps: AROM;Both;10 reps;Seated Long Arc Quad: AROM;Strengthening;Both;10 reps;Seated Hip Flexion/Marching: AROM;Strengthening;Both;10 reps;Seated Mini-Sqauts: AROM;Strengthening;10 reps    General Comments        Pertinent Vitals/Pain Pain Assessment: No/denies pain    Home Living                      Prior Function            PT Goals (current goals can now be found in the care plan section) Acute Rehab PT Goals PT Goal Formulation: With patient/family Time For Goal Achievement: 05/15/18 Potential to Achieve Goals: Good Progress towards PT goals: Progressing toward goals    Frequency    Min 3X/week      PT Plan Current plan remains appropriate    Co-evaluation  AM-PAC PT "6 Clicks" Mobility   Outcome Measure  Help needed turning from your back to your side while in a flat bed without using bedrails?: None Help needed moving from lying on your back to sitting on the side of a flat bed without using bedrails?: None Help needed moving to and from a bed to a chair (including a wheelchair)?: None Help needed standing up from a chair using your arms (e.g., wheelchair or bedside chair)?: None Help needed to walk in hospital room?: None Help needed climbing 3-5  steps with a railing? : A Little 6 Click Score: 23    End of Session Equipment Utilized During Treatment: Gait belt Activity Tolerance: Patient tolerated treatment well Patient left: in bed;with call bell/phone within reach;with family/visitor present;Other (comment)(sitting EOB) Nurse Communication: Mobility status PT Visit Diagnosis: Other abnormalities of gait and mobility (R26.89)     Time: 1518-3437 PT Time Calculation (min) (ACUTE ONLY): 24 min  Charges:  $Gait Training: 23-37 mins                     Sherie Don, [place], DPT  Acute Rehabilitation Services Pager [phone] Office [place] 05/02/2018, 11:38 AM

## 2018-05-05 ENCOUNTER — Telehealth: Payer: Self-pay | Admitting: Internal Medicine

## 2018-05-05 NOTE — Telephone Encounter (Signed)
Patient was hosp last week for Hypertensive urgency Sent home on amlodippnne bid,clonidine 0.1 bid and labetalol  BP on Sunday 146/82 Today the nurse at [facility] called to say BP 136/  ? If he missd some of meds and that is why his BP went so high   (250)     Cancel renal artery USN

## 2018-05-06 ENCOUNTER — Ambulatory Visit (HOSPITAL_COMMUNITY): Payer: Medicare Other

## 2018-05-08 DIAGNOSIS — G458 Other transient cerebral ischemic attacks and related syndromes: Secondary | ICD-10-CM | POA: Diagnosis not present

## 2018-05-08 DIAGNOSIS — R278 Other lack of coordination: Secondary | ICD-10-CM | POA: Diagnosis not present

## 2018-05-08 DIAGNOSIS — M6281 Muscle weakness (generalized): Secondary | ICD-10-CM | POA: Diagnosis not present

## 2018-05-08 DIAGNOSIS — R2689 Other abnormalities of gait and mobility: Secondary | ICD-10-CM | POA: Diagnosis not present

## 2018-05-08 DIAGNOSIS — R482 Apraxia: Secondary | ICD-10-CM | POA: Diagnosis not present

## 2018-05-12 DIAGNOSIS — M6281 Muscle weakness (generalized): Secondary | ICD-10-CM | POA: Diagnosis not present

## 2018-05-12 DIAGNOSIS — R2689 Other abnormalities of gait and mobility: Secondary | ICD-10-CM | POA: Diagnosis not present

## 2018-05-12 DIAGNOSIS — R482 Apraxia: Secondary | ICD-10-CM | POA: Diagnosis not present

## 2018-05-12 DIAGNOSIS — R278 Other lack of coordination: Secondary | ICD-10-CM | POA: Diagnosis not present

## 2018-05-12 DIAGNOSIS — G458 Other transient cerebral ischemic attacks and related syndromes: Secondary | ICD-10-CM | POA: Diagnosis not present

## 2018-05-14 DIAGNOSIS — H43821 Vitreomacular adhesion, right eye: Secondary | ICD-10-CM | POA: Diagnosis not present

## 2018-05-14 DIAGNOSIS — H43811 Vitreous degeneration, right eye: Secondary | ICD-10-CM | POA: Diagnosis not present

## 2018-05-14 DIAGNOSIS — H353211 Exudative age-related macular degeneration, right eye, with active choroidal neovascularization: Secondary | ICD-10-CM | POA: Diagnosis not present

## 2018-05-15 ENCOUNTER — Ambulatory Visit (INDEPENDENT_AMBULATORY_CARE_PROVIDER_SITE_OTHER): Payer: Medicare Other

## 2018-05-15 DIAGNOSIS — G458 Other transient cerebral ischemic attacks and related syndromes: Secondary | ICD-10-CM | POA: Diagnosis not present

## 2018-05-15 DIAGNOSIS — R482 Apraxia: Secondary | ICD-10-CM | POA: Diagnosis not present

## 2018-05-15 DIAGNOSIS — R55 Syncope and collapse: Secondary | ICD-10-CM

## 2018-05-15 DIAGNOSIS — R278 Other lack of coordination: Secondary | ICD-10-CM | POA: Diagnosis not present

## 2018-05-15 DIAGNOSIS — R2689 Other abnormalities of gait and mobility: Secondary | ICD-10-CM | POA: Diagnosis not present

## 2018-05-15 DIAGNOSIS — M6281 Muscle weakness (generalized): Secondary | ICD-10-CM | POA: Diagnosis not present

## 2018-05-17 IMAGING — XA DG FLUORO GUIDE LUMBAR PUNCTURE
1 series · 1 of 1 positions shown · non-contrast
Comparison: none

CLINICAL DATA: Gait disorder. Evaluation for normal pressure
hydrocephalus.

[Series 1: ortho standard · 1 of 1 slices shown]
[im 1/1]
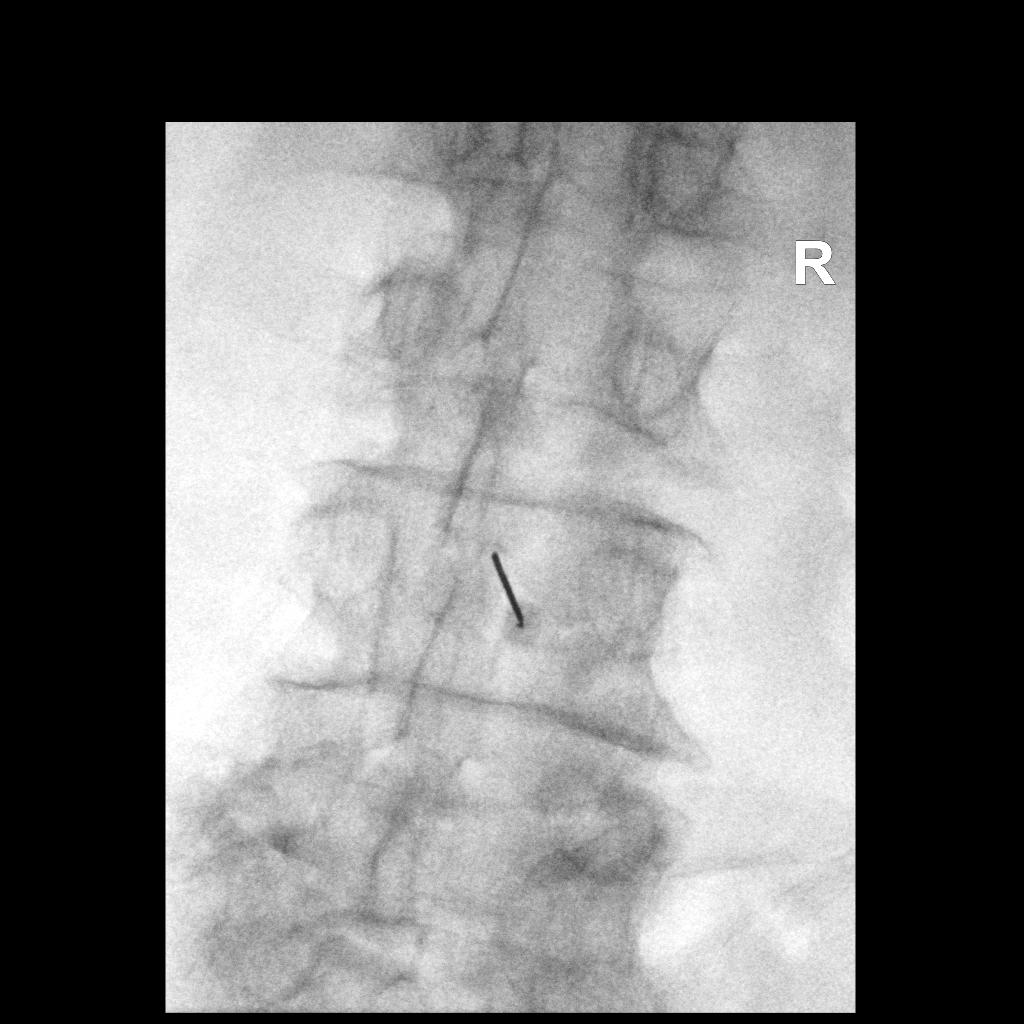

[1 of 1 positions shown; findings below may reference images not displayed]

EXAM:
DIAGNOSTIC LUMBAR PUNCTURE UNDER FLUOROSCOPIC GUIDANCE

FLUOROSCOPY TIME:  Fluoroscopy Time:  2 seconds

Radiation Exposure Index (if provided by the fluoroscopic device):
20.74 microGray*m^2

Number of Acquired Spot Images: 0

PROCEDURE:
Informed consent was obtained from the patient prior to the
procedure, including potential complications of headache, allergy,
and pain. With the patient prone, the lower back was prepped with
Betadine. 1% Lidocaine was used for local anesthesia. Lumbar
puncture was performed at the L3-4 level using a 3.5 inch 20 gauge
needle via a right interlaminar approach with return of clear CSF
with an opening pressure of 11 cm water (Measured in the left
lateral decubitus position). 35 mL of CSF were obtained for
laboratory studies. The patient tolerated the procedure well and
there were no apparent complications.
IMPRESSION: Successful fluoroscopic guided high volume lumbar puncture.

## 2018-05-20 ENCOUNTER — Encounter (HOSPITAL_COMMUNITY): Payer: Medicare Other

## 2018-05-20 ENCOUNTER — Telehealth: Payer: Self-pay | Admitting: Internal Medicine

## 2018-05-20 DIAGNOSIS — R482 Apraxia: Secondary | ICD-10-CM | POA: Diagnosis not present

## 2018-05-20 DIAGNOSIS — R2689 Other abnormalities of gait and mobility: Secondary | ICD-10-CM | POA: Diagnosis not present

## 2018-05-20 DIAGNOSIS — G458 Other transient cerebral ischemic attacks and related syndromes: Secondary | ICD-10-CM | POA: Diagnosis not present

## 2018-05-20 DIAGNOSIS — M6281 Muscle weakness (generalized): Secondary | ICD-10-CM | POA: Diagnosis not present

## 2018-05-20 DIAGNOSIS — R278 Other lack of coordination: Secondary | ICD-10-CM | POA: Diagnosis not present

## 2018-05-20 NOTE — Telephone Encounter (Signed)
Patient's daughter called    Pt is doing good   Feels good    BP has been good   Recomm:  Will cancel USN of renal artery

## 2018-05-21 ENCOUNTER — Encounter: Payer: Self-pay | Admitting: Family Medicine

## 2018-05-21 ENCOUNTER — Ambulatory Visit (INDEPENDENT_AMBULATORY_CARE_PROVIDER_SITE_OTHER): Payer: Medicare Other | Admitting: Family Medicine

## 2018-05-21 DIAGNOSIS — M5136 Other intervertebral disc degeneration, lumbar region: Secondary | ICD-10-CM | POA: Diagnosis not present

## 2018-05-21 DIAGNOSIS — R410 Disorientation, unspecified: Secondary | ICD-10-CM | POA: Diagnosis not present

## 2018-05-21 NOTE — Assessment & Plan Note (Signed)
Patient does have known degenerative disc disease.  We discussed the possibility of advanced imaging but I do not think patient's pain is that unrelenting.  I do believe with patient's most recent TIA versus CVA back formal physical therapy for strength and coordination is going to be the most helpful.  Concerned about adding any other medications at this time and would like to hold but will consider the possibility of a low-dose gabapentin that could help with some of the radicular symptoms patient has.  No significant weakness noted on exam today.  Not concerned for any post CVA weakness at the moment and deep tendon reflexes appear to be intact.  We discussed staying active have that would be more beneficial.  Patient will try these different interventions and follow-up with me again in 4 weeks.  Spent  25 minutes with patient face-to-face and had greater than 50% of counseling including as described above in assessment and plan.

## 2018-05-21 NOTE — Progress Notes (Signed)
[name] [address] [place], [place] [address] Phone: [phone] Subjective:    I'm seeing this patient by the request  of:    CC: Back pain and foot pain  [id number]   04/21/2018: Mild back pain likely causing some radicular symptoms in the legs.  Patient does have some atrophy.  I do believe that some of the numbness is secondary to this as well as the breakdown of the transverse and longitudinal arches of the feet.  With patient having some difficulty with confusion I would like to hold on anything such as gabapentin at the moment.  Try over-the-counter medications, home exercises and core strengthening.  Discussed icing regimen.  Patient will follow-up with me again 4 to 8 weeks  05/21/2018: Adam [name] is a 85 y.o. male coming in with complaint of back and foot pain. Feels that his legs are no better than last visit. His foot pain is better than last visit. Had a TIA since lat visit.  Patient did have an MRI at that time.  This was independently visualized by me.  Patient was found to have a focal moderate proximal left M1 stenosis with age related atrophy and chronic small vessel ischemic disease     Past Medical History:  Diagnosis Date  . Acute meniscal tear of knee RIGHT KNEE  . Arthritis   . BPH (benign prostatic hypertrophy)   . Colon polyps    hyperplastic  . Diabetes mellitus type 2, diet-controlled (HCC) AND EXERCISE-- LAST A1C  5.6  . Diverticulosis   . Gait abnormality 11/08/2016  . GERD (gastroesophageal reflux disease)   . Glaucoma   . H/O hiatal hernia   . Heart murmur   . History of esophageal stricture S/P DILATION IN 2007  . Hyperlipidemia   . Hypertension   . IgM monoclonal gammopathy of uncertain significance ASYMPTOMATIC    FOLLOWED BY DR Alen Blew  . Internal hemorrhoids   . Macrocytic anemia DUE TO CHRONIC RENAL INSUFF.  . Nocturia   . Right knee pain    Past Surgical History:  Procedure Laterality Date  .  CATARACT EXTRACTION W/ INTRAOCULAR LENS  IMPLANT, BILATERAL  2012  . HEMORRHOID SURGERY    . HERNIA REPAIR    . KNEE ARTHROSCOPY  12/25/2011   Procedure: ARTHROSCOPY KNEE;  Surgeon: Tobi Bastos, MD;  [facility];  Service: Orthopedics;  Laterality: Right;  right knee arthroscopy with medial menisectomy     Social History   Socioeconomic History  . Marital status: Married    Spouse name: Mechele Claude  . Number of children: 2  . Years of education: 72  . Highest education level: Not on file  Occupational History  . Not on file  Social Needs  . Financial resource strain: Not on file  . Food insecurity:    Worry: Not on file    Inability: Not on file  . Transportation needs:    Medical: Not on file    Non-medical: Not on file  Tobacco Use  . Smoking status: Former Smoker    Packs/day: 1.00    Years: 10.00    Pack years: 10.00    Types: Cigarettes  . Smokeless tobacco: Never Used  Substance and Sexual Activity  . Alcohol use: Yes    Alcohol/week: 14.0 standard drinks    Types: 14 Standard drinks or equivalent per week  . Drug use: No  . Sexual activity: Not on file  Lifestyle  .  Physical activity:    Days per week: Not on file    Minutes per session: Not on file  . Stress: Not on file  Relationships  . Social connections:    Talks on phone: Not on file    Gets together: Not on file    Attends religious service: Not on file    Active member of club or organization: Not on file    Attends meetings of clubs or organizations: Not on file    Relationship status: Not on file  Other Topics Concern  . Not on file  Social History Narrative   Lives with wife, Mechele Claude   Caffeine use: Diet coke daily   Right-handed   No Known Allergies Family History  Problem Relation Age of Onset  . Diabetes Brother   . Heart attack Father   . Early death Father   . Hearing loss Father   . Heart disease Father   . Alcohol abuse Sister   . Cancer Sister       Current Outpatient Medications (Cardiovascular):  .  amLODipine (NORVASC) 5 MG tablet, Take 1 tablet (5 mg total) by mouth daily. Marland Kitchen  atorvastatin (LIPITOR) 20 MG tablet, Take 1 tablet (20 mg total) by mouth daily. .  cloNIDine (CATAPRES) 0.1 MG tablet, Take 1 tablet (0.1 mg total) by mouth 2 (two) times daily. Marland Kitchen  labetalol (NORMODYNE) 100 MG tablet, Take 100 mg by mouth 2 (two) times daily.   Current Outpatient Medications (Analgesics):  .  aspirin EC 325 MG EC tablet, Take 1 tablet (325 mg total) by mouth daily. Marland Kitchen  ULORIC 40 MG tablet, Take 40 mg by mouth 3 (three) times a week. Take 1 tablet by mouth on Tuesday, Friday, and Sunday   Current Outpatient Medications (Other):  .  brimonidine-timolol (COMBIGAN) 0.2-0.5 % ophthalmic solution, Place 1 drop into the left eye 2 (two) times daily.  .  clobetasol cream (TEMOVATE) 7.37 %, Apply 1 application topically every evening. .  DORZOLAMIDE HCL-TIMOLOL MAL OP, Place 1 drop into the left eye 2 (two) times daily.  .  finasteride (PROSCAR) 5 MG tablet, Take 5 mg by mouth daily.  .  Multiple Vitamins-Minerals (PRESERVISION AREDS PO), Take 1 tablet by mouth 2 (two) times daily.  .  Netarsudil-Latanoprost (ROCKLATAN) 0.02-0.005 % SOLN, Place 1 drop into the left eye at bedtime.  Marland Kitchen  nystatin-triamcinolone (MYCOLOG II) cream, Apply 1 application topically as needed (skin raah). .  SODIUM BICARBONATE PO, Take 15 g by mouth 2 (two) times daily.  .  vitamin C (ASCORBIC ACID) 500 MG tablet, Take 500 mg by mouth daily. .  Vitamin D, Ergocalciferol, (DRISDOL) 1.25 MG (50000 UT) CAPS capsule, Take 1 capsule (50,000 Units total) by mouth every 7 (seven) days. (Patient taking differently: Take 50,000 Units by mouth every Tuesday. )    Past medical history, social, surgical and family history all reviewed in electronic medical record.  No pertanent information unless stated regarding to the chief complaint.   Review of Systems:  No headache, visual  changes, nausea, vomiting, diarrhea, constipation, dizziness, abdominal pain, skin rash, fevers, chills, night sweats, weight loss, swollen lymph nodes, body aches, joint swelling,  chest pain, shortness of breath, mood changes.  Positive muscle aches  Objective  Blood pressure (!) 118/50, pulse (!) 56, height 5\' 3"  (1.6 m), weight 149 lb (67.6 kg), SpO2 99 %.    General: No apparent distress does have some mild confusion at baseline. HEENT: Pupils equal, extraocular movements intact  Respiratory: Patient's speak in full sentences and does not appear short of breath  Cardiovascular: trace lower extremity edema, non tender, no erythema  Skin: Warm dry intact with no signs of infection or rash on extremities or on axial skeleton.  Abdomen: Soft nontender  Neuro: Cranial nerves II through XII are intact, neurovascularly intact in all extremities with 2+ DTRs and 2+ pulses.  Lymph: No lymphadenopathy of posterior or anterior cervical chain or axillae bilaterally.  Gait mild antalgic MSK:  Mild tender with limited range of motion and good stability and symmetric strength and tone of shoulders, elbows, wrist, hip, knee and bilaterally.  Patient back exam does have some loss of lordosis very mild degenerative scoliosis.  Tenderness noted of the paraspinal musculature in the back.  Lower extremities have some very mild atrophy noted but seems to be at patient's baseline.  Arthritic changes of the knees noted bilaterally but no significant instability.  Patient has 5 out of 5 strength.  Deep tendon reflexes appear to be intact.  Neurovascular intact distally.  Foot exams do have overpronation of the hindfoot.     Impression and Recommendations:     This case required medical decision making of moderate complexity. The above documentation has been reviewed and is accurate and complete Lyndal Pulley, DO       Note: This dictation was prepared with Dragon dictation along with smaller phrase  technology. Any transcriptional errors that result from this process are unintentional.

## 2018-05-21 NOTE — Telephone Encounter (Signed)
Renal artery [place] cancelled.

## 2018-05-21 NOTE — Patient Instructions (Signed)
Good to see you  I am sorry for the set back but still optimistic.  I think PT will be key for you and will help with strength and coordination  COnitnue the shoes Stay off the tart cherry  After the once weekly vitamin D then change to 2000IU daily  I do want to see you again in 4-6 weeks and then we can discuss other medicines or imaging if needed

## 2018-05-21 NOTE — Assessment & Plan Note (Signed)
Confusion noted at baseline again.  Patient went back and forth after which the legs seem to be affected from his back.  This was also noted with some mild confusion at last appointment.

## 2018-05-22 DIAGNOSIS — M6281 Muscle weakness (generalized): Secondary | ICD-10-CM | POA: Diagnosis not present

## 2018-05-22 DIAGNOSIS — G458 Other transient cerebral ischemic attacks and related syndromes: Secondary | ICD-10-CM | POA: Diagnosis not present

## 2018-05-22 DIAGNOSIS — R482 Apraxia: Secondary | ICD-10-CM | POA: Diagnosis not present

## 2018-05-22 DIAGNOSIS — R278 Other lack of coordination: Secondary | ICD-10-CM | POA: Diagnosis not present

## 2018-05-22 DIAGNOSIS — R2689 Other abnormalities of gait and mobility: Secondary | ICD-10-CM | POA: Diagnosis not present

## 2018-05-23 ENCOUNTER — Encounter (HOSPITAL_COMMUNITY): Payer: Medicare Other

## 2018-05-26 DIAGNOSIS — R2689 Other abnormalities of gait and mobility: Secondary | ICD-10-CM | POA: Diagnosis not present

## 2018-05-26 DIAGNOSIS — R278 Other lack of coordination: Secondary | ICD-10-CM | POA: Diagnosis not present

## 2018-05-26 DIAGNOSIS — R482 Apraxia: Secondary | ICD-10-CM | POA: Diagnosis not present

## 2018-05-26 DIAGNOSIS — M6281 Muscle weakness (generalized): Secondary | ICD-10-CM | POA: Diagnosis not present

## 2018-05-26 DIAGNOSIS — G458 Other transient cerebral ischemic attacks and related syndromes: Secondary | ICD-10-CM | POA: Diagnosis not present

## 2018-05-29 DIAGNOSIS — R2689 Other abnormalities of gait and mobility: Secondary | ICD-10-CM | POA: Diagnosis not present

## 2018-05-29 DIAGNOSIS — M6281 Muscle weakness (generalized): Secondary | ICD-10-CM | POA: Diagnosis not present

## 2018-05-29 DIAGNOSIS — G458 Other transient cerebral ischemic attacks and related syndromes: Secondary | ICD-10-CM | POA: Diagnosis not present

## 2018-05-29 DIAGNOSIS — R278 Other lack of coordination: Secondary | ICD-10-CM | POA: Diagnosis not present

## 2018-05-29 DIAGNOSIS — R482 Apraxia: Secondary | ICD-10-CM | POA: Diagnosis not present

## 2018-05-30 ENCOUNTER — Other Ambulatory Visit: Payer: Self-pay | Admitting: *Deleted

## 2018-05-30 MED ORDER — AMLODIPINE BESYLATE 5 MG PO TABS: 5.0000 mg | ORAL_TABLET | Freq: Every day | ORAL | 3 refills | Status: DC

## 2018-05-30 NOTE — Progress Notes (Signed)
Per message from Dr. [name], refilled amlodipine 5 mg once a day. Short term (15 days) to [facility] and then maintenance supply to [facility]. Called patient's daughter and left VM informing.  Adv to call back if any questions and that Dr. [name] will plan to see patient in about 4-5 months.

## 2018-06-03 DIAGNOSIS — R278 Other lack of coordination: Secondary | ICD-10-CM | POA: Diagnosis not present

## 2018-06-03 DIAGNOSIS — R2689 Other abnormalities of gait and mobility: Secondary | ICD-10-CM | POA: Diagnosis not present

## 2018-06-03 DIAGNOSIS — M6281 Muscle weakness (generalized): Secondary | ICD-10-CM | POA: Diagnosis not present

## 2018-06-03 DIAGNOSIS — R482 Apraxia: Secondary | ICD-10-CM | POA: Diagnosis not present

## 2018-06-03 DIAGNOSIS — G458 Other transient cerebral ischemic attacks and related syndromes: Secondary | ICD-10-CM | POA: Diagnosis not present

## 2018-06-06 DIAGNOSIS — R2689 Other abnormalities of gait and mobility: Secondary | ICD-10-CM | POA: Diagnosis not present

## 2018-06-06 DIAGNOSIS — R278 Other lack of coordination: Secondary | ICD-10-CM | POA: Diagnosis not present

## 2018-06-06 DIAGNOSIS — R482 Apraxia: Secondary | ICD-10-CM | POA: Diagnosis not present

## 2018-06-06 DIAGNOSIS — G458 Other transient cerebral ischemic attacks and related syndromes: Secondary | ICD-10-CM | POA: Diagnosis not present

## 2018-06-06 DIAGNOSIS — M6281 Muscle weakness (generalized): Secondary | ICD-10-CM | POA: Diagnosis not present

## 2018-06-09 DIAGNOSIS — H353221 Exudative age-related macular degeneration, left eye, with active choroidal neovascularization: Secondary | ICD-10-CM | POA: Diagnosis not present

## 2018-06-09 DIAGNOSIS — H353211 Exudative age-related macular degeneration, right eye, with active choroidal neovascularization: Secondary | ICD-10-CM | POA: Diagnosis not present

## 2018-06-09 DIAGNOSIS — H353113 Nonexudative age-related macular degeneration, right eye, advanced atrophic without subfoveal involvement: Secondary | ICD-10-CM | POA: Diagnosis not present

## 2018-06-10 DIAGNOSIS — G458 Other transient cerebral ischemic attacks and related syndromes: Secondary | ICD-10-CM | POA: Diagnosis not present

## 2018-06-10 DIAGNOSIS — M6281 Muscle weakness (generalized): Secondary | ICD-10-CM | POA: Diagnosis not present

## 2018-06-10 DIAGNOSIS — R2689 Other abnormalities of gait and mobility: Secondary | ICD-10-CM | POA: Diagnosis not present

## 2018-06-10 DIAGNOSIS — R482 Apraxia: Secondary | ICD-10-CM | POA: Diagnosis not present

## 2018-06-10 DIAGNOSIS — R278 Other lack of coordination: Secondary | ICD-10-CM | POA: Diagnosis not present

## 2018-06-12 DIAGNOSIS — G458 Other transient cerebral ischemic attacks and related syndromes: Secondary | ICD-10-CM | POA: Diagnosis not present

## 2018-06-12 DIAGNOSIS — M6281 Muscle weakness (generalized): Secondary | ICD-10-CM | POA: Diagnosis not present

## 2018-06-12 DIAGNOSIS — R482 Apraxia: Secondary | ICD-10-CM | POA: Diagnosis not present

## 2018-06-12 DIAGNOSIS — R2689 Other abnormalities of gait and mobility: Secondary | ICD-10-CM | POA: Diagnosis not present

## 2018-06-12 DIAGNOSIS — R278 Other lack of coordination: Secondary | ICD-10-CM | POA: Diagnosis not present

## 2018-06-23 ENCOUNTER — Ambulatory Visit: Payer: Medicare Other | Admitting: Nurse Practitioner

## 2018-07-03 ENCOUNTER — Ambulatory Visit: Payer: Medicare Other | Admitting: Family Medicine

## 2018-08-19 DIAGNOSIS — H353211 Exudative age-related macular degeneration, right eye, with active choroidal neovascularization: Secondary | ICD-10-CM | POA: Diagnosis not present

## 2018-08-19 DIAGNOSIS — H43821 Vitreomacular adhesion, right eye: Secondary | ICD-10-CM | POA: Diagnosis not present

## 2018-08-19 DIAGNOSIS — H35371 Puckering of macula, right eye: Secondary | ICD-10-CM | POA: Diagnosis not present

## 2018-08-19 DIAGNOSIS — H353221 Exudative age-related macular degeneration, left eye, with active choroidal neovascularization: Secondary | ICD-10-CM | POA: Diagnosis not present

## 2018-08-27 DIAGNOSIS — H35352 Cystoid macular degeneration, left eye: Secondary | ICD-10-CM | POA: Diagnosis not present

## 2018-08-27 DIAGNOSIS — H353221 Exudative age-related macular degeneration, left eye, with active choroidal neovascularization: Secondary | ICD-10-CM | POA: Diagnosis not present

## 2018-08-27 DIAGNOSIS — H35372 Puckering of macula, left eye: Secondary | ICD-10-CM | POA: Diagnosis not present

## 2018-08-27 DIAGNOSIS — H43812 Vitreous degeneration, left eye: Secondary | ICD-10-CM | POA: Diagnosis not present

## 2018-09-10 DIAGNOSIS — E782 Mixed hyperlipidemia: Secondary | ICD-10-CM | POA: Diagnosis not present

## 2018-09-10 DIAGNOSIS — I129 Hypertensive chronic kidney disease with stage 1 through stage 4 chronic kidney disease, or unspecified chronic kidney disease: Secondary | ICD-10-CM | POA: Diagnosis not present

## 2018-09-10 DIAGNOSIS — N183 Chronic kidney disease, stage 3 (moderate): Secondary | ICD-10-CM | POA: Diagnosis not present

## 2018-09-11 DIAGNOSIS — I10 Essential (primary) hypertension: Secondary | ICD-10-CM | POA: Diagnosis not present

## 2018-09-24 DIAGNOSIS — H43821 Vitreomacular adhesion, right eye: Secondary | ICD-10-CM | POA: Diagnosis not present

## 2018-09-24 DIAGNOSIS — H353211 Exudative age-related macular degeneration, right eye, with active choroidal neovascularization: Secondary | ICD-10-CM | POA: Diagnosis not present

## 2018-10-01 DIAGNOSIS — N400 Enlarged prostate without lower urinary tract symptoms: Secondary | ICD-10-CM | POA: Diagnosis not present

## 2018-10-01 DIAGNOSIS — E1122 Type 2 diabetes mellitus with diabetic chronic kidney disease: Secondary | ICD-10-CM | POA: Diagnosis not present

## 2018-10-01 DIAGNOSIS — Z0000 Encounter for general adult medical examination without abnormal findings: Secondary | ICD-10-CM | POA: Diagnosis not present

## 2018-10-01 DIAGNOSIS — H409 Unspecified glaucoma: Secondary | ICD-10-CM | POA: Diagnosis not present

## 2018-10-01 DIAGNOSIS — E782 Mixed hyperlipidemia: Secondary | ICD-10-CM | POA: Diagnosis not present

## 2018-10-01 DIAGNOSIS — N183 Chronic kidney disease, stage 3 (moderate): Secondary | ICD-10-CM | POA: Diagnosis not present

## 2018-10-01 DIAGNOSIS — I129 Hypertensive chronic kidney disease with stage 1 through stage 4 chronic kidney disease, or unspecified chronic kidney disease: Secondary | ICD-10-CM | POA: Diagnosis not present

## 2018-10-02 DIAGNOSIS — R7301 Impaired fasting glucose: Secondary | ICD-10-CM | POA: Diagnosis not present

## 2018-10-08 DIAGNOSIS — H353221 Exudative age-related macular degeneration, left eye, with active choroidal neovascularization: Secondary | ICD-10-CM | POA: Diagnosis not present

## 2018-10-08 DIAGNOSIS — H35372 Puckering of macula, left eye: Secondary | ICD-10-CM | POA: Diagnosis not present

## 2018-10-08 DIAGNOSIS — H401121 Primary open-angle glaucoma, left eye, mild stage: Secondary | ICD-10-CM | POA: Diagnosis not present

## 2018-11-03 DIAGNOSIS — H401111 Primary open-angle glaucoma, right eye, mild stage: Secondary | ICD-10-CM | POA: Diagnosis not present

## 2018-11-03 DIAGNOSIS — H401123 Primary open-angle glaucoma, left eye, severe stage: Secondary | ICD-10-CM | POA: Diagnosis not present

## 2018-11-03 DIAGNOSIS — Z961 Presence of intraocular lens: Secondary | ICD-10-CM | POA: Diagnosis not present

## 2018-11-05 DIAGNOSIS — H35371 Puckering of macula, right eye: Secondary | ICD-10-CM | POA: Diagnosis not present

## 2018-11-05 DIAGNOSIS — H353211 Exudative age-related macular degeneration, right eye, with active choroidal neovascularization: Secondary | ICD-10-CM | POA: Diagnosis not present

## 2018-11-05 DIAGNOSIS — H353221 Exudative age-related macular degeneration, left eye, with active choroidal neovascularization: Secondary | ICD-10-CM | POA: Diagnosis not present

## 2018-11-05 DIAGNOSIS — H43821 Vitreomacular adhesion, right eye: Secondary | ICD-10-CM | POA: Diagnosis not present

## 2018-11-05 DIAGNOSIS — H353113 Nonexudative age-related macular degeneration, right eye, advanced atrophic without subfoveal involvement: Secondary | ICD-10-CM | POA: Diagnosis not present

## 2018-11-26 DIAGNOSIS — H43812 Vitreous degeneration, left eye: Secondary | ICD-10-CM | POA: Diagnosis not present

## 2018-11-26 DIAGNOSIS — H353221 Exudative age-related macular degeneration, left eye, with active choroidal neovascularization: Secondary | ICD-10-CM | POA: Diagnosis not present

## 2018-11-26 DIAGNOSIS — H35352 Cystoid macular degeneration, left eye: Secondary | ICD-10-CM | POA: Diagnosis not present

## 2018-11-26 DIAGNOSIS — H35372 Puckering of macula, left eye: Secondary | ICD-10-CM | POA: Diagnosis not present

## 2018-11-26 DIAGNOSIS — H353123 Nonexudative age-related macular degeneration, left eye, advanced atrophic without subfoveal involvement: Secondary | ICD-10-CM | POA: Diagnosis not present

## 2018-12-17 DIAGNOSIS — H35371 Puckering of macula, right eye: Secondary | ICD-10-CM | POA: Diagnosis not present

## 2018-12-17 DIAGNOSIS — H43821 Vitreomacular adhesion, right eye: Secondary | ICD-10-CM | POA: Diagnosis not present

## 2018-12-17 DIAGNOSIS — H353113 Nonexudative age-related macular degeneration, right eye, advanced atrophic without subfoveal involvement: Secondary | ICD-10-CM | POA: Diagnosis not present

## 2018-12-17 DIAGNOSIS — H353211 Exudative age-related macular degeneration, right eye, with active choroidal neovascularization: Secondary | ICD-10-CM | POA: Diagnosis not present

## 2019-01-07 DIAGNOSIS — H353113 Nonexudative age-related macular degeneration, right eye, advanced atrophic without subfoveal involvement: Secondary | ICD-10-CM | POA: Diagnosis not present

## 2019-01-07 DIAGNOSIS — H43812 Vitreous degeneration, left eye: Secondary | ICD-10-CM | POA: Diagnosis not present

## 2019-01-07 DIAGNOSIS — H353211 Exudative age-related macular degeneration, right eye, with active choroidal neovascularization: Secondary | ICD-10-CM | POA: Diagnosis not present

## 2019-01-07 DIAGNOSIS — H353221 Exudative age-related macular degeneration, left eye, with active choroidal neovascularization: Secondary | ICD-10-CM | POA: Diagnosis not present

## 2019-01-28 DIAGNOSIS — H43821 Vitreomacular adhesion, right eye: Secondary | ICD-10-CM | POA: Diagnosis not present

## 2019-01-28 DIAGNOSIS — H35371 Puckering of macula, right eye: Secondary | ICD-10-CM | POA: Diagnosis not present

## 2019-01-28 DIAGNOSIS — H43811 Vitreous degeneration, right eye: Secondary | ICD-10-CM | POA: Diagnosis not present

## 2019-01-28 DIAGNOSIS — H353211 Exudative age-related macular degeneration, right eye, with active choroidal neovascularization: Secondary | ICD-10-CM | POA: Diagnosis not present

## 2019-02-24 DIAGNOSIS — H35372 Puckering of macula, left eye: Secondary | ICD-10-CM | POA: Diagnosis not present

## 2019-02-24 DIAGNOSIS — H43812 Vitreous degeneration, left eye: Secondary | ICD-10-CM | POA: Diagnosis not present

## 2019-02-24 DIAGNOSIS — H353221 Exudative age-related macular degeneration, left eye, with active choroidal neovascularization: Secondary | ICD-10-CM | POA: Diagnosis not present

## 2019-03-03 DIAGNOSIS — H401111 Primary open-angle glaucoma, right eye, mild stage: Secondary | ICD-10-CM | POA: Diagnosis not present

## 2019-03-03 DIAGNOSIS — H401123 Primary open-angle glaucoma, left eye, severe stage: Secondary | ICD-10-CM | POA: Diagnosis not present

## 2019-03-03 DIAGNOSIS — Z961 Presence of intraocular lens: Secondary | ICD-10-CM | POA: Diagnosis not present

## 2019-03-03 DIAGNOSIS — H353232 Exudative age-related macular degeneration, bilateral, with inactive choroidal neovascularization: Secondary | ICD-10-CM | POA: Diagnosis not present

## 2019-03-11 DIAGNOSIS — H353221 Exudative age-related macular degeneration, left eye, with active choroidal neovascularization: Secondary | ICD-10-CM | POA: Diagnosis not present

## 2019-03-11 DIAGNOSIS — H353113 Nonexudative age-related macular degeneration, right eye, advanced atrophic without subfoveal involvement: Secondary | ICD-10-CM | POA: Diagnosis not present

## 2019-03-11 DIAGNOSIS — H43821 Vitreomacular adhesion, right eye: Secondary | ICD-10-CM | POA: Diagnosis not present

## 2019-03-11 DIAGNOSIS — H353211 Exudative age-related macular degeneration, right eye, with active choroidal neovascularization: Secondary | ICD-10-CM | POA: Diagnosis not present

## 2019-03-31 DIAGNOSIS — Z23 Encounter for immunization: Secondary | ICD-10-CM | POA: Diagnosis not present

## 2019-04-08 DIAGNOSIS — H353221 Exudative age-related macular degeneration, left eye, with active choroidal neovascularization: Secondary | ICD-10-CM | POA: Diagnosis not present

## 2019-04-08 DIAGNOSIS — H35372 Puckering of macula, left eye: Secondary | ICD-10-CM | POA: Diagnosis not present

## 2019-04-22 DIAGNOSIS — H353113 Nonexudative age-related macular degeneration, right eye, advanced atrophic without subfoveal involvement: Secondary | ICD-10-CM | POA: Diagnosis not present

## 2019-04-22 DIAGNOSIS — H43821 Vitreomacular adhesion, right eye: Secondary | ICD-10-CM | POA: Diagnosis not present

## 2019-04-22 DIAGNOSIS — H353123 Nonexudative age-related macular degeneration, left eye, advanced atrophic without subfoveal involvement: Secondary | ICD-10-CM | POA: Diagnosis not present

## 2019-04-22 DIAGNOSIS — H353211 Exudative age-related macular degeneration, right eye, with active choroidal neovascularization: Secondary | ICD-10-CM | POA: Diagnosis not present

## 2019-04-28 DIAGNOSIS — Z23 Encounter for immunization: Secondary | ICD-10-CM | POA: Diagnosis not present

## 2019-05-13 DIAGNOSIS — N400 Enlarged prostate without lower urinary tract symptoms: Secondary | ICD-10-CM | POA: Diagnosis not present

## 2019-05-13 DIAGNOSIS — E782 Mixed hyperlipidemia: Secondary | ICD-10-CM | POA: Diagnosis not present

## 2019-05-13 DIAGNOSIS — K219 Gastro-esophageal reflux disease without esophagitis: Secondary | ICD-10-CM | POA: Diagnosis not present

## 2019-05-13 DIAGNOSIS — I129 Hypertensive chronic kidney disease with stage 1 through stage 4 chronic kidney disease, or unspecified chronic kidney disease: Secondary | ICD-10-CM | POA: Diagnosis not present

## 2019-05-13 DIAGNOSIS — D638 Anemia in other chronic diseases classified elsewhere: Secondary | ICD-10-CM | POA: Diagnosis not present

## 2019-05-13 DIAGNOSIS — N1832 Chronic kidney disease, stage 3b: Secondary | ICD-10-CM | POA: Diagnosis not present

## 2019-05-13 DIAGNOSIS — M1A0790 Idiopathic chronic gout, unspecified ankle and foot, without tophus (tophi): Secondary | ICD-10-CM | POA: Diagnosis not present

## 2019-05-13 DIAGNOSIS — E1121 Type 2 diabetes mellitus with diabetic nephropathy: Secondary | ICD-10-CM | POA: Diagnosis not present

## 2019-05-20 DIAGNOSIS — H35362 Drusen (degenerative) of macula, left eye: Secondary | ICD-10-CM | POA: Diagnosis not present

## 2019-05-20 DIAGNOSIS — H43812 Vitreous degeneration, left eye: Secondary | ICD-10-CM | POA: Diagnosis not present

## 2019-05-20 DIAGNOSIS — H401121 Primary open-angle glaucoma, left eye, mild stage: Secondary | ICD-10-CM | POA: Diagnosis not present

## 2019-05-20 DIAGNOSIS — H353221 Exudative age-related macular degeneration, left eye, with active choroidal neovascularization: Secondary | ICD-10-CM | POA: Diagnosis not present

## 2019-05-27 DIAGNOSIS — I10 Essential (primary) hypertension: Secondary | ICD-10-CM | POA: Diagnosis not present

## 2019-06-03 DIAGNOSIS — H401121 Primary open-angle glaucoma, left eye, mild stage: Secondary | ICD-10-CM | POA: Diagnosis not present

## 2019-06-03 DIAGNOSIS — H353211 Exudative age-related macular degeneration, right eye, with active choroidal neovascularization: Secondary | ICD-10-CM | POA: Diagnosis not present

## 2019-06-03 DIAGNOSIS — H43821 Vitreomacular adhesion, right eye: Secondary | ICD-10-CM | POA: Diagnosis not present

## 2019-06-24 DIAGNOSIS — R600 Localized edema: Secondary | ICD-10-CM | POA: Diagnosis not present

## 2019-06-24 DIAGNOSIS — N1832 Chronic kidney disease, stage 3b: Secondary | ICD-10-CM | POA: Diagnosis not present

## 2019-06-24 DIAGNOSIS — I129 Hypertensive chronic kidney disease with stage 1 through stage 4 chronic kidney disease, or unspecified chronic kidney disease: Secondary | ICD-10-CM | POA: Diagnosis not present

## 2019-07-01 DIAGNOSIS — H401111 Primary open-angle glaucoma, right eye, mild stage: Secondary | ICD-10-CM | POA: Diagnosis not present

## 2019-07-01 DIAGNOSIS — H353232 Exudative age-related macular degeneration, bilateral, with inactive choroidal neovascularization: Secondary | ICD-10-CM | POA: Diagnosis not present

## 2019-07-01 DIAGNOSIS — Z961 Presence of intraocular lens: Secondary | ICD-10-CM | POA: Diagnosis not present

## 2019-07-01 DIAGNOSIS — H401123 Primary open-angle glaucoma, left eye, severe stage: Secondary | ICD-10-CM | POA: Diagnosis not present

## 2019-07-09 ENCOUNTER — Ambulatory Visit (INDEPENDENT_AMBULATORY_CARE_PROVIDER_SITE_OTHER): Payer: Medicare Other | Admitting: Ophthalmology

## 2019-07-09 ENCOUNTER — Encounter (INDEPENDENT_AMBULATORY_CARE_PROVIDER_SITE_OTHER): Payer: Self-pay | Admitting: Ophthalmology

## 2019-07-09 ENCOUNTER — Other Ambulatory Visit: Payer: Self-pay

## 2019-07-09 DIAGNOSIS — H35371 Puckering of macula, right eye: Secondary | ICD-10-CM | POA: Diagnosis not present

## 2019-07-09 DIAGNOSIS — H353113 Nonexudative age-related macular degeneration, right eye, advanced atrophic without subfoveal involvement: Secondary | ICD-10-CM | POA: Diagnosis not present

## 2019-07-09 DIAGNOSIS — H43812 Vitreous degeneration, left eye: Secondary | ICD-10-CM | POA: Diagnosis not present

## 2019-07-09 DIAGNOSIS — H353221 Exudative age-related macular degeneration, left eye, with active choroidal neovascularization: Secondary | ICD-10-CM | POA: Diagnosis not present

## 2019-07-09 DIAGNOSIS — H43821 Vitreomacular adhesion, right eye: Secondary | ICD-10-CM | POA: Insufficient documentation

## 2019-07-09 DIAGNOSIS — H353211 Exudative age-related macular degeneration, right eye, with active choroidal neovascularization: Secondary | ICD-10-CM | POA: Insufficient documentation

## 2019-07-09 DIAGNOSIS — H401121 Primary open-angle glaucoma, left eye, mild stage: Secondary | ICD-10-CM

## 2019-07-09 MED ORDER — AFLIBERCEPT 2MG/0.05ML IZ SOLN FOR KALEIDOSCOPE
2.0000 mg | INTRAVITREAL | Status: AC | PRN
  Administered 2019-07-09: 11:00:00 2 mg via INTRAVITREAL

## 2019-07-09 NOTE — Progress Notes (Signed)
Order(s) created erroneously. Erroneous order ID: [id number]  Order canceled by: Milas Hock  Order cancel date/time: 07/09/2019 2:09 PM

## 2019-07-09 NOTE — Progress Notes (Signed)
07/09/2019     CHIEF COMPLAINT Patient presents for Macular Degeneration   HISTORY OF PRESENT ILLNESS: Adam [name] is a 86 y.o. male who presents to the clinic today for:   HPI    7 Week AMD F/U OS, poss Eylea OS - OCT  Pt denies noticeable changes to [place] OU since last visit. Pt denies ocular pain, flashes of light, or floaters OU.     Last edited by Rockie Neighbours, [place] on 07/09/2019  9:58 AM. (History)      Referring physician: Javier Glazier, MD [place],  [place] [address]  HISTORICAL INFORMATION:   Selected notes from the MEDICAL RECORD NUMBER    Lab Results  Component Value Date   HGBA1C 6.0 (H) 05/01/2018     CURRENT MEDICATIONS: Current Outpatient Medications (Ophthalmic Drugs)  Medication Sig  . brimonidine-timolol (COMBIGAN) 0.2-0.5 % ophthalmic solution Place 1 drop into the left eye 2 (two) times daily.   . DORZOLAMIDE HCL-TIMOLOL MAL OP Place 1 drop into the left eye 2 (two) times daily.   Marland Kitchen latanoprost (XALATAN) 0.005 % ophthalmic solution latanoprost 0.005 % eye drops  . Netarsudil-Latanoprost (ROCKLATAN) 0.02-0.005 % SOLN Place 1 drop into the left eye at bedtime.    No current facility-administered medications for this visit. (Ophthalmic Drugs)   Current Outpatient Medications (Other)  Medication Sig  . betamethasone dipropionate (DIPROLENE) 0.05 % ointment   . furosemide (LASIX) 20 MG tablet Take by mouth.  Marland Kitchen omeprazole (PRILOSEC OTC) 20 MG tablet Take by mouth.  . Sodium Bicarbonate POWD Takes 2 capsules daily  . amLODipine (NORVASC) 5 MG tablet Take 1 tablet (5 mg total) by mouth daily.  Marland Kitchen amLODipine (NORVASC) 5 MG tablet Take 1 tablet (5 mg total) by mouth daily.  Marland Kitchen aspirin EC 325 MG EC tablet Take 1 tablet (325 mg total) by mouth daily.  Marland Kitchen atorvastatin (LIPITOR) 20 MG tablet Take 1 tablet (20 mg total) by mouth daily.  . Cholecalciferol 25 MCG (1000 UT) tablet Take by mouth.  . clobetasol cream (TEMOVATE) 1.95 % Apply 1 application  topically every evening.  . cloNIDine (CATAPRES) 0.1 MG tablet Take 1 tablet (0.1 mg total) by mouth 2 (two) times daily.  . finasteride (PROSCAR) 5 MG tablet Take 5 mg by mouth daily.   . furosemide (LASIX) 20 MG tablet Take 20 mg by mouth daily.  . hydrALAZINE (APRESOLINE) 25 MG tablet Take 25 mg by mouth 3 (three) times daily.  Marland Kitchen labetalol (NORMODYNE) 100 MG tablet Take 100 mg by mouth 2 (two) times daily.  . Multiple Vitamins-Minerals (PRESERVISION AREDS PO) Take 1 tablet by mouth 2 (two) times daily.   Marland Kitchen nystatin-triamcinolone (MYCOLOG II) cream Apply 1 application topically as needed (skin raah).  . SODIUM BICARBONATE PO Take 15 g by mouth 2 (two) times daily.   Marland Kitchen ULORIC 40 MG tablet Take 40 mg by mouth 3 (three) times a week. Take 1 tablet by mouth on Tuesday, Friday, and Sunday  . vitamin C (ASCORBIC ACID) 500 MG tablet Take 500 mg by mouth daily.  . Vitamin D, Ergocalciferol, (DRISDOL) 1.25 MG (50000 UT) CAPS capsule Take 1 capsule (50,000 Units total) by mouth every 7 (seven) days. (Patient taking differently: Take 50,000 Units by mouth every Tuesday. )   No current facility-administered medications for this visit. (Other)      REVIEW OF SYSTEMS:    ALLERGIES No Known Allergies  PAST MEDICAL HISTORY Past Medical History:  Diagnosis Date  .  Acute meniscal tear of knee RIGHT KNEE  . Arthritis   . BPH (benign prostatic hypertrophy)   . Colon polyps    hyperplastic  . Diabetes mellitus type 2, diet-controlled (HCC) AND EXERCISE-- LAST A1C  5.6  . Diverticulosis   . Gait abnormality 11/08/2016  . GERD (gastroesophageal reflux disease)   . Glaucoma   . H/O hiatal hernia   . Heart murmur   . History of esophageal stricture S/P DILATION IN 2007  . Hyperlipidemia   . Hypertension   . IgM monoclonal gammopathy of uncertain significance ASYMPTOMATIC    FOLLOWED BY DR Alen Blew  . Internal hemorrhoids   . Macrocytic anemia DUE TO CHRONIC RENAL INSUFF.  . Nocturia   . Right  knee pain    Past Surgical History:  Procedure Laterality Date  . CATARACT EXTRACTION W/ INTRAOCULAR LENS  IMPLANT, BILATERAL  2012  . HEMORRHOID SURGERY    . HERNIA REPAIR    . KNEE ARTHROSCOPY  12/25/2011   Procedure: ARTHROSCOPY KNEE;  Surgeon: Tobi Bastos, MD;  Location: [facility];  Service: Orthopedics;  Laterality: Right;  right knee arthroscopy with medial menisectomy      FAMILY HISTORY Family History  Problem Relation Age of Onset  . Diabetes Brother   . Heart attack Father   . Early death Father   . Hearing loss Father   . Heart disease Father   . Alcohol abuse Sister   . Cancer Sister     SOCIAL HISTORY Social History   Tobacco Use  . Smoking status: Former Smoker    Packs/day: 1.00    Years: 10.00    Pack years: 10.00    Types: Cigarettes  . Smokeless tobacco: Never Used  Substance Use Topics  . Alcohol use: Yes    Alcohol/week: 14.0 standard drinks    Types: 14 Standard drinks or equivalent per week  . Drug use: No         OPHTHALMIC EXAM:  Base Eye Exam    Visual Acuity (Snellen - Linear)      Right Left   Dist  [place] 20/70 +2 20/30 +2   Dist ph [place] 20/50 -2 NI       Tonometry (Tonopen, 10:04 AM)      Right Left   Pressure 05 06       Pupils      Pupils Dark Light Shape React APD   Right PERRL 4 3 Round Brisk None   Left PERRL 3 2 Round Brisk None       Visual Fields (Counting fingers)      Left Right     Full   Restrictions Total inferior temporal, inferior nasal deficiencies        Extraocular Movement      Right Left    Full Full       Neuro/Psych    Oriented x3: Yes   Mood/Affect: Normal       Dilation    Left eye: 1.0% Mydriacyl, 2.5% Phenylephrine @ 10:05 AM        Slit Lamp and Fundus Exam    External Exam      Right Left   External  Normal       Slit Lamp Exam      Right Left   Lids/Lashes Normal Normal   Conjunctiva/Sclera White and quiet White and quiet   Cornea Clear Clear    Anterior Chamber Deep and quiet Deep and quiet   Iris Round  and reactive Round and reactive   Lens Clear Posterior chamber intraocular lens   Vitreous Normal Normal       Fundus Exam      Right Left   Disc  Normal   C/D Ratio  0.3   Macula  Atrophy, Age related macular degeneration, Early age related macular degeneration, Macular thickening improved   Vessels  Normal   Periphery  Normal          IMAGING AND PROCEDURES  Imaging and Procedures for @TODAY @  OCT, Retina - OU - Both Eyes       Procedure was unable to be completed due to computer issue.  This charge should not be filed.  The doctor is working with epic billing staff to confirm discharge is canceled.       Intravitreal Injection, Pharmacologic Agent - OS - Left Eye       Time Out 07/09/2019. 11:09 AM. Confirmed correct patient, procedure, site, and patient consented.   Anesthesia Topical anesthesia was used. Anesthetic medications included Ak[place] 3.5%.   Procedure Preparation included 5% betadine to ocular surface, 10% betadine to eyelids, Tobramycin 0.3%. A 30 gauge needle was used.   Injection:  2 mg aflibercept Alfonse Flavors) SOLN   NDC: [id number], Lot: [id number]   Route: Intravitreal, Site: Left Eye, Waste: 0 mg  Post-op Post injection exam found visual acuity of at least counting fingers. The patient tolerated the procedure well. There were no complications. The patient received written and verbal post procedure care education. Post injection medications were not given.                 ASSESSMENT/PLAN:  @PROBAPNOTE @    ICD-10-CM   1. Exudative age-related macular degeneration of left eye with active choroidal neovascularization (HCC)  H35.3221 OCT, Retina - OU - Both Eyes    Intravitreal Injection, Pharmacologic Agent - OS - Left Eye    aflibercept (EYLEA) SOLN 2 mg    CANCELED: Intravitreal Injection, Pharmacologic Agent - OS - Left Eye    CANCELED: OCT, Retina - OU - Both Eyes  2.  Exudative age-related macular degeneration of right eye with active choroidal neovascularization ([place])  H35.3211 CANCELED: OCT, Retina - OU - Both Eyes  3. Primary open angle glaucoma of left eye, mild stage  H40.1121   4. Advanced nonexudative age-related macular degeneration of right eye without subfoveal involvement  H35.3113   5. Macular pucker, right eye  H35.371   6. Vitreomacular adhesion of right eye  H43.821   7. Posterior vitreous detachment of left eye  H43.812     1.  2.  3.  Ophthalmic Meds Ordered this visit:  Meds ordered this encounter  Medications  . aflibercept (EYLEA) SOLN 2 mg       Return in about 7 weeks (around 08/27/2019) for EYLEA OCT, OS.  Patient Instructions  The nature of macular pucker was discussed with the patient as well as threshold criteria for vitrectomy surgery. I explained that in rare cases another surgery is needed to actually remove a second wrinkle should it regrow.  Most often, the epiretinal membrane and underlying wrinkled internal limiting membrane are removed, to accomplish the goals.   If the operative eye is [place], cataract surgery is often recommended prior to Vitrectomy. This will enable the surgeon to have the best view during surgery and the patient optimal results in the future. Treatment options were discussed.      Explained the diagnoses, plan, and follow up with the  patient and they expressed understanding.  Patient expressed understanding of the importance of proper follow up care.   Clent Demark Disaya Walt M.D. Diseases & Surgery of the Retina and Vitreous [facility] [place] @TODAY @     Abbreviations: M myopia (nearsighted); A astigmatism; H hyperopia (farsighted); P presbyopia; Mrx spectacle prescription;  CTL contact lenses; OD right eye; OS left eye; OU both eyes  XT exotropia; ET esotropia; PEK punctate epithelial keratitis; PEE punctate epithelial erosions; DES dry eye syndrome; MGD meibomian gland  dysfunction; ATs artificial tears; PFAT's preservative free artificial tears; [place] nuclear sclerotic cataract; PSC posterior subcapsular cataract; ERM epi-retinal membrane; PVD posterior vitreous detachment; RD retinal detachment; DM diabetes mellitus; DR diabetic retinopathy; NPDR non-proliferative diabetic retinopathy; PDR proliferative diabetic retinopathy; CSME clinically significant macular edema; DME diabetic macular edema; dbh dot blot hemorrhages; CWS cotton wool spot; POAG primary open angle glaucoma; C/D cup-to-disc ratio; HVF humphrey visual field; GVF goldmann visual field; OCT optical coherence tomography; IOP intraocular pressure; BRVO Branch retinal vein occlusion; CRVO central retinal vein occlusion; CRAO central retinal artery occlusion; BRAO branch retinal artery occlusion; RT retinal tear; SB scleral buckle; PPV pars plana vitrectomy; VH Vitreous hemorrhage; PRP panretinal laser photocoagulation; IVK intravitreal kenalog; VMT vitreomacular traction; MH Macular hole;  NVD neovascularization of the disc; NVE neovascularization elsewhere; AREDS age related eye disease study; ARMD age related macular degeneration; POAG primary open angle glaucoma; EBMD epithelial/anterior basement membrane dystrophy; ACIOL anterior chamber intraocular lens; IOL intraocular lens; PCIOL posterior chamber intraocular lens; Phaco/IOL phacoemulsification with intraocular lens placement; [place] photorefractive keratectomy; LASIK laser assisted in situ keratomileusis; HTN hypertension; DM diabetes mellitus; COPD chronic obstructive pulmonary disease

## 2019-07-09 NOTE — Patient Instructions (Signed)
The nature of macular pucker was discussed with the patient as well as threshold criteria for vitrectomy surgery. I explained that in rare cases another surgery is needed to actually remove a second wrinkle should it regrow.  Most often, the epiretinal membrane and underlying wrinkled internal limiting membrane are removed, to accomplish the goals.   If the operative eye is [place], cataract surgery is often recommended prior to Vitrectomy. This will enable the surgeon to have the best view during surgery and the patient optimal results in the future. Treatment options were discussed. 

## 2019-07-15 ENCOUNTER — Encounter (INDEPENDENT_AMBULATORY_CARE_PROVIDER_SITE_OTHER): Payer: Medicare Other | Admitting: Ophthalmology

## 2019-07-23 ENCOUNTER — Other Ambulatory Visit: Payer: Self-pay

## 2019-07-23 ENCOUNTER — Ambulatory Visit (INDEPENDENT_AMBULATORY_CARE_PROVIDER_SITE_OTHER): Payer: Medicare Other | Admitting: Ophthalmology

## 2019-07-23 ENCOUNTER — Encounter (INDEPENDENT_AMBULATORY_CARE_PROVIDER_SITE_OTHER): Payer: Self-pay | Admitting: Ophthalmology

## 2019-07-23 DIAGNOSIS — H35371 Puckering of macula, right eye: Secondary | ICD-10-CM

## 2019-07-23 DIAGNOSIS — H43821 Vitreomacular adhesion, right eye: Secondary | ICD-10-CM

## 2019-07-23 DIAGNOSIS — H353221 Exudative age-related macular degeneration, left eye, with active choroidal neovascularization: Secondary | ICD-10-CM

## 2019-07-23 DIAGNOSIS — H35351 Cystoid macular degeneration, right eye: Secondary | ICD-10-CM | POA: Diagnosis not present

## 2019-07-23 DIAGNOSIS — H353211 Exudative age-related macular degeneration, right eye, with active choroidal neovascularization: Secondary | ICD-10-CM

## 2019-07-23 MED ORDER — BEVACIZUMAB CHEMO INJECTION 1.25MG/0.05ML SYRINGE FOR KALEIDOSCOPE
1.2500 mg | INTRAVITREAL | Status: AC | PRN
  Administered 2019-07-23: 11:00:00 1.25 mg via INTRAVITREAL

## 2019-07-23 NOTE — Assessment & Plan Note (Signed)

## 2019-07-23 NOTE — Assessment & Plan Note (Signed)

## 2019-07-23 NOTE — Progress Notes (Signed)
07/23/2019     CHIEF COMPLAINT Patient presents for Retina Follow Up   HISTORY OF PRESENT ILLNESS: Adam [name] is a 86 y.o. male who presents to the clinic today for:   HPI    Retina Follow Up    Patient presents with  Wet AMD.  In right eye.  Duration of 7 weeks.  Since onset it is stable.          Comments    7 week follow up- OCT OU, Possible Avastin OD Patient denies change in vision and overall has no complaints.        Last edited by Gerda Diss on 07/23/2019  9:44 AM. (History)      Referring physician: Javier Glazier, MD [place],  [place] [address]  HISTORICAL INFORMATION:   Selected notes from the MEDICAL RECORD NUMBER    Lab Results  Component Value Date   HGBA1C 6.0 (H) 05/01/2018     CURRENT MEDICATIONS: Current Outpatient Medications (Ophthalmic Drugs)  Medication Sig  . brimonidine-timolol (COMBIGAN) 0.2-0.5 % ophthalmic solution Place 1 drop into the left eye 2 (two) times daily.   . DORZOLAMIDE HCL-TIMOLOL MAL OP Place 1 drop into the left eye 2 (two) times daily.   Marland Kitchen latanoprost (XALATAN) 0.005 % ophthalmic solution latanoprost 0.005 % eye drops  . Netarsudil-Latanoprost (ROCKLATAN) 0.02-0.005 % SOLN Place 1 drop into the left eye at bedtime.    No current facility-administered medications for this visit. (Ophthalmic Drugs)   Current Outpatient Medications (Other)  Medication Sig  . amLODipine (NORVASC) 5 MG tablet Take 1 tablet (5 mg total) by mouth daily.  Marland Kitchen amLODipine (NORVASC) 5 MG tablet Take 1 tablet (5 mg total) by mouth daily.  Marland Kitchen aspirin EC 325 MG EC tablet Take 1 tablet (325 mg total) by mouth daily.  Marland Kitchen atorvastatin (LIPITOR) 20 MG tablet Take 1 tablet (20 mg total) by mouth daily.  . betamethasone dipropionate (DIPROLENE) 0.05 % ointment   . Cholecalciferol 25 MCG (1000 UT) tablet Take by mouth.  . clobetasol cream (TEMOVATE) 1.02 % Apply 1 application topically every evening.  . cloNIDine (CATAPRES) 0.1 MG tablet  Take 1 tablet (0.1 mg total) by mouth 2 (two) times daily.  . finasteride (PROSCAR) 5 MG tablet Take 5 mg by mouth daily.   . furosemide (LASIX) 20 MG tablet Take by mouth.  . furosemide (LASIX) 20 MG tablet Take 20 mg by mouth daily.  . hydrALAZINE (APRESOLINE) 25 MG tablet Take 25 mg by mouth 3 (three) times daily.  Marland Kitchen labetalol (NORMODYNE) 100 MG tablet Take 100 mg by mouth 2 (two) times daily.  . Multiple Vitamins-Minerals (PRESERVISION AREDS PO) Take 1 tablet by mouth 2 (two) times daily.   Marland Kitchen nystatin-triamcinolone (MYCOLOG II) cream Apply 1 application topically as needed (skin raah).  Marland Kitchen omeprazole (PRILOSEC OTC) 20 MG tablet Take by mouth.  . SODIUM BICARBONATE PO Take 15 g by mouth 2 (two) times daily.   . Sodium Bicarbonate POWD Takes 2 capsules daily  . ULORIC 40 MG tablet Take 40 mg by mouth 3 (three) times a week. Take 1 tablet by mouth on Tuesday, Friday, and Sunday  . vitamin C (ASCORBIC ACID) 500 MG tablet Take 500 mg by mouth daily.  . Vitamin D, Ergocalciferol, (DRISDOL) 1.25 MG (50000 UT) CAPS capsule Take 1 capsule (50,000 Units total) by mouth every 7 (seven) days. (Patient taking differently: Take 50,000 Units by mouth every Tuesday. )   No current facility-administered  medications for this visit. (Other)      REVIEW OF SYSTEMS:    ALLERGIES No Known Allergies  PAST MEDICAL HISTORY Past Medical History:  Diagnosis Date  . Acute meniscal tear of knee RIGHT KNEE  . Arthritis   . BPH (benign prostatic hypertrophy)   . Colon polyps    hyperplastic  . Diabetes mellitus type 2, diet-controlled (HCC) AND EXERCISE-- LAST A1C  5.6  . Diverticulosis   . Gait abnormality 11/08/2016  . GERD (gastroesophageal reflux disease)   . Glaucoma   . H/O hiatal hernia   . Heart murmur   . History of esophageal stricture S/P DILATION IN 2007  . Hyperlipidemia   . Hypertension   . IgM monoclonal gammopathy of uncertain significance ASYMPTOMATIC    FOLLOWED BY DR Alen Blew  .  Internal hemorrhoids   . Macrocytic anemia DUE TO CHRONIC RENAL INSUFF.  . Nocturia   . Right knee pain    Past Surgical History:  Procedure Laterality Date  . CATARACT EXTRACTION W/ INTRAOCULAR LENS  IMPLANT, BILATERAL  2012  . HEMORRHOID SURGERY    . HERNIA REPAIR    . KNEE ARTHROSCOPY  12/25/2011   Procedure: ARTHROSCOPY KNEE;  Surgeon: Tobi Bastos, MD;  Location: [facility];  Service: Orthopedics;  Laterality: Right;  right knee arthroscopy with medial menisectomy      FAMILY HISTORY Family History  Problem Relation Age of Onset  . Diabetes Brother   . Heart attack Father   . Early death Father   . Hearing loss Father   . Heart disease Father   . Alcohol abuse Sister   . Cancer Sister     SOCIAL HISTORY Social History   Tobacco Use  . Smoking status: Former Smoker    Packs/day: 1.00    Years: 10.00    Pack years: 10.00    Types: Cigarettes  . Smokeless tobacco: Never Used  Substance Use Topics  . Alcohol use: Yes    Alcohol/week: 14.0 standard drinks    Types: 14 Standard drinks or equivalent per week  . Drug use: No         OPHTHALMIC EXAM:  Base Eye Exam    Visual Acuity (Snellen - Linear)      Right Left   Dist [place] 20/60-1 20/30-2   Dist ph [place] 20/40-2 NI       Tonometry (Tonopen, 9:52 AM)      Right Left   Pressure 10 9       Pupils      Pupils Dark Light Shape React APD   Right PERRL 3 3 Round Slow None   Left PERRL 3 2 Round Slow +2       Visual Fields (Counting fingers)      Left Right     Full   Restrictions Partial outer inferior temporal, inferior nasal deficiencies        Extraocular Movement      Right Left    Full Full       Neuro/Psych    Oriented x3: Yes   Mood/Affect: Normal       Dilation    Right eye: 1.0% Mydriacyl, 2.5% Phenylephrine @ 9:52 AM        Slit Lamp and Fundus Exam    External Exam      Right Left   External Normal Normal       Slit Lamp Exam      Right Left    Lids/Lashes  Normal Normal   Conjunctiva/Sclera White and quiet White and quiet   Cornea Clear Clear   Anterior Chamber Deep and quiet Deep and quiet   Iris Round and reactive Round and reactive   Lens Posterior chamber intraocular lens Posterior chamber intraocular lens   Anterior Vitreous Normal Normal       Fundus Exam      Right Left   Posterior Vitreous Posterior vitreous detachment    Disc Normal    C/D Ratio 0.1    Macula Retinal pigment epithelial mottling, Subretinal neovascular membrane, no hemorrhage, Retinal pigment epithelial atrophy, Macular atrophy, Early age related macular degeneration, no exudates    Vessels Normal    Periphery Normal           IMAGING AND PROCEDURES  Imaging and Procedures for 07/23/19  OCT, Retina - OU - Both Eyes       Right Eye Quality was good. Scan locations included subfoveal. Central Foveal Thickness: 349. Progression has improved. Findings include retinal drusen , abnormal foveal contour, subretinal scarring, cystoid macular edema, macular pucker, outer retinal atrophy, central retinal atrophy, choroidal neovascular membrane.   Left Eye Quality was good. Scan locations included subfoveal. Central Foveal Thickness: 265. Progression has improved. Findings include abnormal foveal contour, outer retinal atrophy, central retinal atrophy, retinal drusen , choroidal neovascular membrane.   Notes OD, stable overall.  Some foveal atrophy some perifoveal CME is chronic, will repeat Avastin OD today         Intravitreal Injection, Pharmacologic Agent - OD - Right Eye       Time Out 07/23/2019. 10:52 AM. Confirmed correct patient, procedure, site, and patient consented.   Anesthesia Topical anesthesia was used. Anesthetic medications included Akten 3.5%.   Procedure Preparation included 10% betadine to eyelids. A 30 gauge needle was used.   Injection:  1.25 mg Bevacizumab (AVASTIN) SOLN   NDC: [address]-8   Route: Intravitreal,  Site: Right Eye, Waste: 0 mg  Post-op Post injection exam found visual acuity of at least counting fingers. The patient tolerated the procedure well. There were no complications. The patient received written and verbal post procedure care education. Post injection medications were not given.                 ASSESSMENT/PLAN:  Exudative age-related macular degeneration of right eye with active choroidal neovascularization (HCC) The nature of wet macular degeneration was discussed with the patient.  Forms of therapy reviewed include the use of Anti-VEGF medications injected painlessly into the eye, as well as other possible treatment modalities, including thermal laser therapy. Fellow eye involvement and risks were discussed with the patient. Upon the finding of wet age related macular degeneration, treatment will be offered. The treatment regimen is on a treat as needed basis with the intent to treat if necessary and extend interval of exams when possible. On average 1 out of 6 patients do not need lifetime therapy. However, the risk of recurrent disease is high for a lifetime.  Initially monthly, then periodic, examinations and evaluations will determine whether the next treatment is required on the day of the examination.  Macular pucker, right eye The nature of macular pucker (epiretinal membrane ERM) was discussed with the patient as well as threshold criteria for vitrectomy surgery. I explained that in rare cases another surgery is needed to actually remove a second wrinkle should it regrow.  Most often, the epiretinal membrane and underlying wrinkled internal limiting membrane are removed with the first surgery, to accomplish  the goals.   If the operative eye is [place] (natural lens still present), cataract surgery is often recommended prior to Vitrectomy. This will enable the retina surgeon to have the best view during surgery and the patient to obtain optimal results in the future. Treatment  options were discussed.      ICD-10-CM   1. Exudative age-related macular degeneration of right eye with active choroidal neovascularization (HCC)  H35.3211 OCT, Retina - OU - Both Eyes    Intravitreal Injection, Pharmacologic Agent - OD - Right Eye    Bevacizumab (AVASTIN) SOLN 1.25 mg  2. Exudative age-related macular degeneration of left eye with active choroidal neovascularization ([place])  H35.3221   3. Macular pucker, right eye  H35.371 OCT, Retina - OU - Both Eyes  4. Vitreomacular adhesion of right eye  H43.821 OCT, Retina - OU - Both Eyes  5. Cystoid macular edema of right eye  H35.351     1.  2.  3.  Ophthalmic Meds Ordered this visit:  Meds ordered this encounter  Medications  . Bevacizumab (AVASTIN) SOLN 1.25 mg       Return in about 7 weeks (around 09/10/2019) for AVASTIN OCT.  There are no Patient Instructions on file for this visit.   Explained the diagnoses, plan, and follow up with the patient and they expressed understanding.  Patient expressed understanding of the importance of proper follow up care.   Clent Demark Taia Bramlett M.D. Diseases & Surgery of the Retina and Vitreous [facility] [place] 07/23/19     Abbreviations: M myopia (nearsighted); A astigmatism; H hyperopia (farsighted); P presbyopia; Mrx spectacle prescription;  CTL contact lenses; OD right eye; OS left eye; OU both eyes  XT exotropia; ET esotropia; PEK punctate epithelial keratitis; PEE punctate epithelial erosions; DES dry eye syndrome; MGD meibomian gland dysfunction; ATs artificial tears; PFAT's preservative free artificial tears; [place] nuclear sclerotic cataract; PSC posterior subcapsular cataract; ERM epi-retinal membrane; PVD posterior vitreous detachment; RD retinal detachment; DM diabetes mellitus; DR diabetic retinopathy; NPDR non-proliferative diabetic retinopathy; PDR proliferative diabetic retinopathy; CSME clinically significant macular edema; DME diabetic macular edema; dbh dot  blot hemorrhages; CWS cotton wool spot; POAG primary open angle glaucoma; C/D cup-to-disc ratio; HVF humphrey visual field; GVF goldmann visual field; OCT optical coherence tomography; IOP intraocular pressure; BRVO Branch retinal vein occlusion; CRVO central retinal vein occlusion; CRAO central retinal artery occlusion; BRAO branch retinal artery occlusion; RT retinal tear; SB scleral buckle; PPV pars plana vitrectomy; VH Vitreous hemorrhage; PRP panretinal laser photocoagulation; IVK intravitreal kenalog; VMT vitreomacular traction; MH Macular hole;  NVD neovascularization of the disc; NVE neovascularization elsewhere; AREDS age related eye disease study; ARMD age related macular degeneration; POAG primary open angle glaucoma; EBMD epithelial/anterior basement membrane dystrophy; ACIOL anterior chamber intraocular lens; IOL intraocular lens; PCIOL posterior chamber intraocular lens; Phaco/IOL phacoemulsification with intraocular lens placement; [place] photorefractive keratectomy; LASIK laser assisted in situ keratomileusis; HTN hypertension; DM diabetes mellitus; COPD chronic obstructive pulmonary disease

## 2019-07-29 DIAGNOSIS — G459 Transient cerebral ischemic attack, unspecified: Secondary | ICD-10-CM | POA: Diagnosis not present

## 2019-07-29 DIAGNOSIS — R7303 Prediabetes: Secondary | ICD-10-CM | POA: Diagnosis not present

## 2019-07-29 DIAGNOSIS — K219 Gastro-esophageal reflux disease without esophagitis: Secondary | ICD-10-CM | POA: Diagnosis not present

## 2019-07-29 DIAGNOSIS — I10 Essential (primary) hypertension: Secondary | ICD-10-CM | POA: Diagnosis not present

## 2019-08-02 ENCOUNTER — Inpatient Hospital Stay (HOSPITAL_COMMUNITY)
Admission: EM | Admit: 2019-08-02 | Discharge: 2019-08-09 | DRG: 193 | Disposition: A | Discharge status: Home Health | Payer: Medicare Other | Attending: Internal Medicine | Admitting: Internal Medicine

## 2019-08-02 ENCOUNTER — Emergency Department (HOSPITAL_COMMUNITY): Payer: Medicare Other

## 2019-08-02 ENCOUNTER — Encounter (HOSPITAL_COMMUNITY): Payer: Self-pay | Admitting: Emergency Medicine

## 2019-08-02 DIAGNOSIS — M25572 Pain in left ankle and joints of left foot: Secondary | ICD-10-CM | POA: Diagnosis not present

## 2019-08-02 DIAGNOSIS — R072 Precordial pain: Secondary | ICD-10-CM

## 2019-08-02 DIAGNOSIS — N179 Acute kidney failure, unspecified: Secondary | ICD-10-CM | POA: Diagnosis not present

## 2019-08-02 DIAGNOSIS — I4891 Unspecified atrial fibrillation: Secondary | ICD-10-CM | POA: Diagnosis not present

## 2019-08-02 DIAGNOSIS — Z20822 Contact with and (suspected) exposure to covid-19: Secondary | ICD-10-CM | POA: Diagnosis present

## 2019-08-02 DIAGNOSIS — F0390 Unspecified dementia without behavioral disturbance: Secondary | ICD-10-CM | POA: Diagnosis present

## 2019-08-02 DIAGNOSIS — Z7982 Long term (current) use of aspirin: Secondary | ICD-10-CM

## 2019-08-02 DIAGNOSIS — R0602 Shortness of breath: Secondary | ICD-10-CM

## 2019-08-02 DIAGNOSIS — R0902 Hypoxemia: Secondary | ICD-10-CM | POA: Diagnosis not present

## 2019-08-02 DIAGNOSIS — J189 Pneumonia, unspecified organism: Principal | ICD-10-CM | POA: Diagnosis present

## 2019-08-02 DIAGNOSIS — K219 Gastro-esophageal reflux disease without esophagitis: Secondary | ICD-10-CM | POA: Diagnosis present

## 2019-08-02 DIAGNOSIS — N281 Cyst of kidney, acquired: Secondary | ICD-10-CM | POA: Diagnosis not present

## 2019-08-02 DIAGNOSIS — I491 Atrial premature depolarization: Secondary | ICD-10-CM | POA: Diagnosis not present

## 2019-08-02 DIAGNOSIS — N184 Chronic kidney disease, stage 4 (severe): Secondary | ICD-10-CM | POA: Diagnosis present

## 2019-08-02 DIAGNOSIS — Z66 Do not resuscitate: Secondary | ICD-10-CM | POA: Diagnosis present

## 2019-08-02 DIAGNOSIS — I130 Hypertensive heart and chronic kidney disease with heart failure and stage 1 through stage 4 chronic kidney disease, or unspecified chronic kidney disease: Secondary | ICD-10-CM | POA: Diagnosis not present

## 2019-08-02 DIAGNOSIS — I5030 Unspecified diastolic (congestive) heart failure: Secondary | ICD-10-CM | POA: Diagnosis not present

## 2019-08-02 DIAGNOSIS — I129 Hypertensive chronic kidney disease with stage 1 through stage 4 chronic kidney disease, or unspecified chronic kidney disease: Secondary | ICD-10-CM | POA: Diagnosis not present

## 2019-08-02 DIAGNOSIS — E876 Hypokalemia: Secondary | ICD-10-CM | POA: Diagnosis not present

## 2019-08-02 DIAGNOSIS — Z87891 Personal history of nicotine dependence: Secondary | ICD-10-CM

## 2019-08-02 DIAGNOSIS — I495 Sick sinus syndrome: Secondary | ICD-10-CM | POA: Diagnosis not present

## 2019-08-02 DIAGNOSIS — E119 Type 2 diabetes mellitus without complications: Secondary | ICD-10-CM | POA: Diagnosis not present

## 2019-08-02 DIAGNOSIS — I5033 Acute on chronic diastolic (congestive) heart failure: Secondary | ICD-10-CM | POA: Diagnosis not present

## 2019-08-02 DIAGNOSIS — N400 Enlarged prostate without lower urinary tract symptoms: Secondary | ICD-10-CM | POA: Diagnosis present

## 2019-08-02 DIAGNOSIS — E1122 Type 2 diabetes mellitus with diabetic chronic kidney disease: Secondary | ICD-10-CM | POA: Diagnosis not present

## 2019-08-02 DIAGNOSIS — I350 Nonrheumatic aortic (valve) stenosis: Secondary | ICD-10-CM | POA: Diagnosis present

## 2019-08-02 DIAGNOSIS — D631 Anemia in chronic kidney disease: Secondary | ICD-10-CM | POA: Diagnosis present

## 2019-08-02 DIAGNOSIS — E785 Hyperlipidemia, unspecified: Secondary | ICD-10-CM | POA: Diagnosis present

## 2019-08-02 DIAGNOSIS — E860 Dehydration: Secondary | ICD-10-CM | POA: Diagnosis not present

## 2019-08-02 DIAGNOSIS — R7989 Other specified abnormal findings of blood chemistry: Secondary | ICD-10-CM

## 2019-08-02 DIAGNOSIS — M549 Dorsalgia, unspecified: Secondary | ICD-10-CM

## 2019-08-02 DIAGNOSIS — H401121 Primary open-angle glaucoma, left eye, mild stage: Secondary | ICD-10-CM | POA: Diagnosis present

## 2019-08-02 DIAGNOSIS — M19071 Primary osteoarthritis, right ankle and foot: Secondary | ICD-10-CM | POA: Diagnosis not present

## 2019-08-02 DIAGNOSIS — E1129 Type 2 diabetes mellitus with other diabetic kidney complication: Secondary | ICD-10-CM | POA: Diagnosis not present

## 2019-08-02 DIAGNOSIS — D472 Monoclonal gammopathy: Secondary | ICD-10-CM | POA: Diagnosis present

## 2019-08-02 DIAGNOSIS — J90 Pleural effusion, not elsewhere classified: Secondary | ICD-10-CM | POA: Diagnosis not present

## 2019-08-02 DIAGNOSIS — Z79899 Other long term (current) drug therapy: Secondary | ICD-10-CM

## 2019-08-02 DIAGNOSIS — Z8249 Family history of ischemic heart disease and other diseases of the circulatory system: Secondary | ICD-10-CM

## 2019-08-02 DIAGNOSIS — M1990 Unspecified osteoarthritis, unspecified site: Secondary | ICD-10-CM | POA: Diagnosis present

## 2019-08-02 DIAGNOSIS — H353231 Exudative age-related macular degeneration, bilateral, with active choroidal neovascularization: Secondary | ICD-10-CM | POA: Diagnosis present

## 2019-08-02 DIAGNOSIS — R1084 Generalized abdominal pain: Secondary | ICD-10-CM | POA: Diagnosis not present

## 2019-08-02 DIAGNOSIS — D72829 Elevated white blood cell count, unspecified: Secondary | ICD-10-CM | POA: Diagnosis present

## 2019-08-02 DIAGNOSIS — I10 Essential (primary) hypertension: Secondary | ICD-10-CM | POA: Diagnosis not present

## 2019-08-02 DIAGNOSIS — J9601 Acute respiratory failure with hypoxia: Secondary | ICD-10-CM | POA: Diagnosis present

## 2019-08-02 DIAGNOSIS — I480 Paroxysmal atrial fibrillation: Secondary | ICD-10-CM | POA: Diagnosis not present

## 2019-08-02 DIAGNOSIS — Z8673 Personal history of transient ischemic attack (TIA), and cerebral infarction without residual deficits: Secondary | ICD-10-CM

## 2019-08-02 DIAGNOSIS — M25579 Pain in unspecified ankle and joints of unspecified foot: Secondary | ICD-10-CM | POA: Diagnosis not present

## 2019-08-02 DIAGNOSIS — H353113 Nonexudative age-related macular degeneration, right eye, advanced atrophic without subfoveal involvement: Secondary | ICD-10-CM | POA: Diagnosis present

## 2019-08-02 DIAGNOSIS — I499 Cardiac arrhythmia, unspecified: Secondary | ICD-10-CM | POA: Diagnosis not present

## 2019-08-02 DIAGNOSIS — Z833 Family history of diabetes mellitus: Secondary | ICD-10-CM

## 2019-08-02 DIAGNOSIS — R918 Other nonspecific abnormal finding of lung field: Secondary | ICD-10-CM | POA: Diagnosis not present

## 2019-08-02 DIAGNOSIS — R52 Pain, unspecified: Secondary | ICD-10-CM

## 2019-08-02 DIAGNOSIS — I340 Nonrheumatic mitral (valve) insufficiency: Secondary | ICD-10-CM | POA: Diagnosis not present

## 2019-08-02 DIAGNOSIS — N1832 Chronic kidney disease, stage 3b: Secondary | ICD-10-CM | POA: Diagnosis not present

## 2019-08-02 DIAGNOSIS — I4819 Other persistent atrial fibrillation: Secondary | ICD-10-CM

## 2019-08-02 DIAGNOSIS — N289 Disorder of kidney and ureter, unspecified: Secondary | ICD-10-CM | POA: Diagnosis not present

## 2019-08-02 LAB — CBC
HCT: 33.1 % — ABNORMAL LOW (ref 39.0–52.0)
Hemoglobin: 10.6 g/dL — ABNORMAL LOW (ref 13.0–17.0)
MCH: 30.4 pg (ref 26.0–34.0)
MCHC: 32 g/dL (ref 30.0–36.0)
MCV: 94.8 fL (ref 80.0–100.0)
Platelets: 191 10*3/uL (ref 150–400)
RBC: 3.49 MIL/uL — ABNORMAL LOW (ref 4.22–5.81)
RDW: 14.2 % (ref 11.5–15.5)
WBC: 11.7 10*3/uL — ABNORMAL HIGH (ref 4.0–10.5)
nRBC: 0 % (ref 0.0–0.2)

## 2019-08-02 LAB — RESPIRATORY PANEL BY RT PCR (FLU A&B, COVID)
Influenza A by PCR: NEGATIVE
Influenza B by PCR: NEGATIVE
SARS Coronavirus 2 by RT PCR: NEGATIVE

## 2019-08-02 LAB — COMPREHENSIVE METABOLIC PANEL
ALT: 12 U/L (ref 0–44)
AST: 14 U/L — ABNORMAL LOW (ref 15–41)
Albumin: 3.8 g/dL (ref 3.5–5.0)
Alkaline Phosphatase: 57 U/L (ref 38–126)
Anion gap: 11 (ref 5–15)
BUN: 32 mg/dL — ABNORMAL HIGH (ref 8–23)
CO2: 21 mmol/L — ABNORMAL LOW (ref 22–32)
Calcium: 8.9 mg/dL (ref 8.9–10.3)
Chloride: 106 mmol/L (ref 98–111)
Creatinine, Ser: 2.45 mg/dL — ABNORMAL HIGH (ref 0.61–1.24)
GFR calc Af Amer: 26 mL/min — ABNORMAL LOW (ref >60)
GFR calc non Af Amer: 23 mL/min — ABNORMAL LOW (ref >60)
Glucose, Bld: 121 mg/dL — ABNORMAL HIGH (ref 70–99)
Potassium: 4.5 mmol/L (ref 3.5–5.1)
Sodium: 138 mmol/L (ref 135–145)
Total Bilirubin: 0.7 mg/dL (ref 0.3–1.2)
Total Protein: 6.4 g/dL — ABNORMAL LOW (ref 6.5–8.1)

## 2019-08-02 LAB — D-DIMER, QUANTITATIVE: D-Dimer, Quant: 0.68 ug/mL-FEU — ABNORMAL HIGH (ref 0.00–0.50)

## 2019-08-02 LAB — BRAIN NATRIURETIC PEPTIDE: B Natriuretic Peptide: 608.2 pg/mL — ABNORMAL HIGH (ref 0.0–100.0)

## 2019-08-02 LAB — LIPASE, BLOOD: Lipase: 36 U/L (ref 11–51)

## 2019-08-02 LAB — TROPONIN I (HIGH SENSITIVITY)
Troponin I (High Sensitivity): 20 ng/L — ABNORMAL HIGH (ref <18)
Troponin I (High Sensitivity): 18 ng/L — ABNORMAL HIGH (ref <18)

## 2019-08-02 MED ORDER — VITAMIN D 25 MCG (1000 UNIT) PO TABS
2000.0000 [IU] | ORAL_TABLET | Freq: Every day | ORAL | Status: DC
  Administered 2019-08-03 – 2019-08-09 (×7): 2000 [IU] via ORAL
  Filled 2019-08-02 (×7): qty 2

## 2019-08-02 MED ORDER — NYSTATIN-TRIAMCINOLONE 100000-0.1 UNIT/GM-% EX CREA: 1.0000 "application " | TOPICAL_CREAM | CUTANEOUS | Status: DC | PRN

## 2019-08-02 MED ORDER — SODIUM BICARBONATE 650 MG PO TABS
1300.0000 mg | ORAL_TABLET | Freq: Two times a day (BID) | ORAL | Status: DC
  Administered 2019-08-03 – 2019-08-09 (×14): 1300 mg via ORAL
  Filled 2019-08-02 (×14): qty 2

## 2019-08-02 MED ORDER — CLONIDINE HCL 0.1 MG PO TABS: 0.1000 mg | ORAL_TABLET | Freq: Two times a day (BID) | ORAL | Status: DC

## 2019-08-02 MED ORDER — MORPHINE SULFATE (PF) 4 MG/ML IV SOLN
4.0000 mg | Freq: Once | INTRAVENOUS | Status: AC
  Administered 2019-08-02: 4 mg via INTRAVENOUS
  Filled 2019-08-02: qty 1

## 2019-08-02 MED ORDER — SODIUM CHLORIDE 0.9 % IV SOLN
2.0000 g | INTRAVENOUS | Status: DC
  Administered 2019-08-02: 2 g via INTRAVENOUS
  Filled 2019-08-02 (×3): qty 20

## 2019-08-02 MED ORDER — DORZOLAMIDE HCL-TIMOLOL MAL 2-0.5 % OP SOLN
1.0000 [drp] | Freq: Two times a day (BID) | OPHTHALMIC | Status: DC
  Administered 2019-08-05 – 2019-08-09 (×9): 1 [drp] via OPHTHALMIC
  Filled 2019-08-02: qty 10

## 2019-08-02 MED ORDER — ATORVASTATIN CALCIUM 10 MG PO TABS
20.0000 mg | ORAL_TABLET | Freq: Every day | ORAL | Status: DC
  Administered 2019-08-03 – 2019-08-09 (×7): 20 mg via ORAL
  Filled 2019-08-02 (×7): qty 2

## 2019-08-02 MED ORDER — TRIAMCINOLONE ACETONIDE 0.5 % EX CREA
TOPICAL_CREAM | Freq: Two times a day (BID) | CUTANEOUS | Status: DC
  Filled 2019-08-02: qty 15

## 2019-08-02 MED ORDER — CLOBETASOL PROPIONATE 0.05 % EX CREA: 1.0000 "application " | TOPICAL_CREAM | Freq: Every evening | CUTANEOUS | Status: DC

## 2019-08-02 MED ORDER — BRIMONIDINE TARTRATE-TIMOLOL 0.2-0.5 % OP SOLN
1.0000 [drp] | Freq: Two times a day (BID) | OPHTHALMIC | Status: DC
  Administered 2019-08-03: 01:00:00 1 [drp] via OPHTHALMIC

## 2019-08-02 MED ORDER — FEBUXOSTAT 40 MG PO TABS
40.0000 mg | ORAL_TABLET | ORAL | Status: DC
  Administered 2019-08-07 – 2019-08-09 (×2): 40 mg via ORAL
  Filled 2019-08-02 (×3): qty 1

## 2019-08-02 MED ORDER — PROSIGHT PO TABS
1.0000 | ORAL_TABLET | Freq: Two times a day (BID) | ORAL | Status: DC
  Administered 2019-08-03 – 2019-08-09 (×14): 1 via ORAL
  Filled 2019-08-02 (×14): qty 1

## 2019-08-02 MED ORDER — SODIUM CHLORIDE 0.9 % IV SOLN
500.0000 mg | INTRAVENOUS | Status: DC
  Administered 2019-08-03: 500 mg via INTRAVENOUS
  Filled 2019-08-02 (×2): qty 500

## 2019-08-02 MED ORDER — ONDANSETRON HCL 4 MG/2ML IJ SOLN
4.0000 mg | Freq: Once | INTRAMUSCULAR | Status: AC
  Administered 2019-08-02: 4 mg via INTRAVENOUS
  Filled 2019-08-02: qty 2

## 2019-08-02 MED ORDER — MORPHINE SULFATE (PF) 4 MG/ML IV SOLN
4.0000 mg | Freq: Once | INTRAVENOUS | Status: AC
  Administered 2019-08-02: 20:00:00 4 mg via INTRAVENOUS
  Filled 2019-08-02: qty 1

## 2019-08-02 MED ORDER — FINASTERIDE 5 MG PO TABS
5.0000 mg | ORAL_TABLET | Freq: Every day | ORAL | Status: DC
  Administered 2019-08-03 – 2019-08-09 (×7): 5 mg via ORAL
  Filled 2019-08-02 (×7): qty 1

## 2019-08-02 MED ORDER — AMLODIPINE BESYLATE 5 MG PO TABS: 5.0000 mg | ORAL_TABLET | Freq: Every day | ORAL | Status: DC

## 2019-08-02 MED ORDER — INSULIN ASPART 100 UNIT/ML ~~LOC~~ SOLN
0.0000 [IU] | Freq: Every day | SUBCUTANEOUS | Status: DC
  Administered 2019-08-07: 1 [IU] via SUBCUTANEOUS

## 2019-08-02 MED ORDER — HYDRALAZINE HCL 50 MG PO TABS
75.0000 mg | ORAL_TABLET | Freq: Three times a day (TID) | ORAL | Status: DC
  Administered 2019-08-03 (×2): 75 mg via ORAL
  Filled 2019-08-02: qty 1
  Filled 2019-08-02: qty 3

## 2019-08-02 MED ORDER — INSULIN ASPART 100 UNIT/ML ~~LOC~~ SOLN
0.0000 [IU] | Freq: Three times a day (TID) | SUBCUTANEOUS | Status: DC
  Administered 2019-08-03: 10:00:00 1 [IU] via SUBCUTANEOUS
  Administered 2019-08-05 – 2019-08-06 (×2): 2 [IU] via SUBCUTANEOUS
  Administered 2019-08-07: 1 [IU] via SUBCUTANEOUS
  Administered 2019-08-07: 5 [IU] via SUBCUTANEOUS
  Administered 2019-08-08 (×2): 1 [IU] via SUBCUTANEOUS
  Administered 2019-08-09: 3 [IU] via SUBCUTANEOUS

## 2019-08-02 MED ORDER — PANTOPRAZOLE SODIUM 40 MG PO TBEC
40.0000 mg | DELAYED_RELEASE_TABLET | Freq: Every day | ORAL | Status: DC
  Administered 2019-08-03 – 2019-08-04 (×2): 40 mg via ORAL
  Filled 2019-08-02 (×2): qty 1

## 2019-08-02 MED ORDER — LABETALOL HCL 100 MG PO TABS
100.0000 mg | ORAL_TABLET | Freq: Two times a day (BID) | ORAL | Status: DC
  Administered 2019-08-03 – 2019-08-04 (×4): 100 mg via ORAL
  Filled 2019-08-02 (×3): qty 1
  Filled 2019-08-02: qty 0.5

## 2019-08-02 MED ORDER — ASPIRIN 81 MG PO CHEW
81.0000 mg | CHEWABLE_TABLET | Freq: Every day | ORAL | Status: DC
  Administered 2019-08-03 – 2019-08-09 (×7): 81 mg via ORAL
  Filled 2019-08-02 (×7): qty 1

## 2019-08-02 MED ORDER — FUROSEMIDE 20 MG PO TABS: 10.0000 mg | ORAL_TABLET | Freq: Every day | ORAL | Status: DC | PRN

## 2019-08-02 MED ORDER — FUROSEMIDE 20 MG PO TABS: 20.0000 mg | ORAL_TABLET | Freq: Every day | ORAL | Status: DC

## 2019-08-02 MED ORDER — NETARSUDIL-LATANOPROST 0.02-0.005 % OP SOLN: 1.0000 [drp] | Freq: Every day | OPHTHALMIC | Status: DC

## 2019-08-02 MED ORDER — LATANOPROST 0.005 % OP SOLN
1.0000 [drp] | Freq: Every day | OPHTHALMIC | Status: DC
  Administered 2019-08-06 – 2019-08-08 (×3): 1 [drp] via OPHTHALMIC
  Filled 2019-08-02: qty 2.5

## 2019-08-02 MED ORDER — ASCORBIC ACID 500 MG PO TABS
500.0000 mg | ORAL_TABLET | Freq: Every day | ORAL | Status: DC
  Administered 2019-08-03 – 2019-08-09 (×7): 500 mg via ORAL
  Filled 2019-08-02 (×7): qty 1

## 2019-08-02 NOTE — ED Provider Notes (Signed)
[facility] EMERGENCY DEPARTMENT Provider Note   CSN: [id number] Arrival date & time: 08/02/19  1433     History Chief Complaint  Patient presents with  . Abdominal Pain  . Chest Pain  . Back Pain    Adam [name] is a 86 y.o. male.  Patient c/o pain 'from waist up', onset this AM. Symptoms acute onset around 7 am today, at rest, while in bed. Symptoms dull, constant, moderate, persistent. Felt pain initially in chest and now also feels in back. Denies chest or back strain. No trauma or fall. No 'tearing' or 'ripping' sensation. No other recent cp or discomfort of any sort, even w exertion. No hx cad. No pleuritic pain. No leg pain or swelling. Currently denies abd pain. No abd distension. No nv. Having normal bms. No fever or chills. Denies cough or uri symptoms. No hx gallstones or pancreatitis.  +mild sob. No nv or diaphoresis.   The history is provided by the patient.  Abdominal Pain Associated symptoms: chest pain and shortness of breath   Associated symptoms: no cough, no dysuria, no fever, no sore throat and no vomiting   Shortness of Breath Associated symptoms: abdominal pain and chest pain   Associated symptoms: no cough, no fever, no headaches, no neck pain, no rash, no sore throat and no vomiting        Past Medical History:  Diagnosis Date  . Acute meniscal tear of knee RIGHT KNEE  . Arthritis   . BPH (benign prostatic hypertrophy)   . Colon polyps    hyperplastic  . Diabetes mellitus type 2, diet-controlled (HCC) AND EXERCISE-- LAST A1C  5.6  . Diverticulosis   . Gait abnormality 11/08/2016  . GERD (gastroesophageal reflux disease)   . Glaucoma   . H/O hiatal hernia   . Heart murmur   . History of esophageal stricture S/P DILATION IN 2007  . Hyperlipidemia   . Hypertension   . IgM monoclonal gammopathy of uncertain significance ASYMPTOMATIC    FOLLOWED BY DR Alen Blew  . Internal hemorrhoids   . Macrocytic anemia DUE TO CHRONIC RENAL INSUFF.   . Nocturia   . Right knee pain     Patient Active Problem List   Diagnosis Date Noted  . Exudative age-related macular degeneration of right eye with active choroidal neovascularization ([place]) 07/09/2019  . Exudative age-related macular degeneration of left eye with active choroidal neovascularization ([place]) 07/09/2019  . Primary open angle glaucoma of left eye, mild stage 07/09/2019  . Advanced nonexudative age-related macular degeneration of right eye without subfoveal involvement 07/09/2019  . Macular pucker, right eye 07/09/2019  . Vitreomacular adhesion of right eye 07/09/2019  . Posterior vitreous detachment of left eye 07/09/2019  . Confusion 04/30/2018  . Apraxia 04/30/2018  . HTN (hypertension) 04/30/2018  . HLD (hyperlipidemia) 04/30/2018  . Type 2 diabetes mellitus ([place]) 04/30/2018  . BPH (benign prostatic hyperplasia) 04/30/2018  . Degenerative disc disease, lumbar 04/21/2018  . Pronation deformity of ankle, acquired 04/21/2018  . Gait abnormality 11/08/2016  . Dizziness and giddiness 06/21/2016  . Meniscus, lateral, anterior horn derangement 12/25/2011  . Meniscus, medial, bucket handle tear, old 12/25/2011  . Osteoarthritis of right knee 12/25/2011    Past Surgical History:  Procedure Laterality Date  . CATARACT EXTRACTION W/ INTRAOCULAR LENS  IMPLANT, BILATERAL  2012  . HEMORRHOID SURGERY    . HERNIA REPAIR    . KNEE ARTHROSCOPY  12/25/2011   Procedure: ARTHROSCOPY KNEE;  Surgeon: Tobi Bastos, MD;  Location: [facility];  Service: Orthopedics;  Laterality: Right;  right knee arthroscopy with medial menisectomy         Family History  Problem Relation Age of Onset  . Diabetes Brother   . Heart attack Father   . Early death Father   . Hearing loss Father   . Heart disease Father   . Alcohol abuse Sister   . Cancer Sister     Social History   Tobacco Use  . Smoking status: Former Smoker    Packs/day: 1.00    Years: 10.00    Pack  years: 10.00    Types: Cigarettes  . Smokeless tobacco: Never Used  Substance Use Topics  . Alcohol use: Yes    Alcohol/week: 14.0 standard drinks    Types: 14 Standard drinks or equivalent per week  . Drug use: No    Home Medications Prior to Admission medications   Medication Sig Start Date End Date Taking? Authorizing Provider  amLODipine (NORVASC) 5 MG tablet Take 1 tablet (5 mg total) by mouth daily. 05/30/18   Fay Records, MD  amLODipine (NORVASC) 5 MG tablet Take 1 tablet (5 mg total) by mouth daily. 05/30/18   Fay Records, MD  aspirin EC 325 MG EC tablet Take 1 tablet (325 mg total) by mouth daily. 05/03/18   Norval Morton, MD  atorvastatin (LIPITOR) 20 MG tablet Take 1 tablet (20 mg total) by mouth daily. 05/03/18   Norval Morton, MD  betamethasone dipropionate (DIPROLENE) 0.05 % ointment  04/27/19   [provider]  brimonidine-timolol (COMBIGAN) 0.2-0.5 % ophthalmic solution Place 1 drop into the left eye 2 (two) times daily.     [provider]  Cholecalciferol 25 MCG (1000 UT) tablet Take by mouth.    [provider]  clobetasol cream (TEMOVATE) 6.16 % Apply 1 application topically every evening. 05/03/16   [provider]  cloNIDine (CATAPRES) 0.1 MG tablet Take 1 tablet (0.1 mg total) by mouth 2 (two) times daily. 05/02/18   Fuller Plan A, MD  DORZOLAMIDE HCL-TIMOLOL MAL OP Place 1 drop into the left eye 2 (two) times daily.     [provider]  finasteride (PROSCAR) 5 MG tablet Take 5 mg by mouth daily.     [provider]  furosemide (LASIX) 20 MG tablet Take by mouth. 06/24/19   [provider]  furosemide (LASIX) 20 MG tablet Take 20 mg by mouth daily. 06/24/19   [provider]  hydrALAZINE (APRESOLINE) 25 MG tablet Take 25 mg by mouth 3 (three) times daily. 05/27/19   [provider]  labetalol (NORMODYNE) 100 MG tablet Take 100 mg by mouth 2 (two) times daily.    [provider]   latanoprost (XALATAN) 0.005 % ophthalmic solution latanoprost 0.005 % eye drops    [provider]  Multiple Vitamins-Minerals (PRESERVISION AREDS PO) Take 1 tablet by mouth 2 (two) times daily.     [provider]  Netarsudil-Latanoprost (ROCKLATAN) 0.02-0.005 % SOLN Place 1 drop into the left eye at bedtime.     [provider]  nystatin-triamcinolone (MYCOLOG II) cream Apply 1 application topically as needed (skin raah).    [provider]  omeprazole (PRILOSEC OTC) 20 MG tablet Take by mouth. 05/13/19   [provider]  SODIUM BICARBONATE PO Take 15 g by mouth 2 (two) times daily.     [provider]  Sodium Bicarbonate POWD Takes 2 capsules daily 10/01/18  [provider]  ULORIC 40 MG tablet Take 40 mg by mouth 3 (three) times a week. Take 1 tablet by mouth on Tuesday, Friday, and Sunday    [provider]  vitamin C (ASCORBIC ACID) 500 MG tablet Take 500 mg by mouth daily.    [provider]  Vitamin D, Ergocalciferol, (DRISDOL) 1.25 MG (50000 UT) CAPS capsule Take 1 capsule (50,000 Units total) by mouth every 7 (seven) days. Patient taking differently: Take 50,000 Units by mouth every Tuesday.  04/21/18   Lyndal Pulley, DO    Allergies    Patient has no known allergies.  Review of Systems   Review of Systems  Constitutional: Negative for fever.  HENT: Negative for sore throat.   Eyes: Negative for redness.  Respiratory: Positive for shortness of breath. Negative for cough.   Cardiovascular: Positive for chest pain. Negative for leg swelling.  Gastrointestinal: Positive for abdominal pain. Negative for vomiting.  Endocrine: Negative for polyuria.  Genitourinary: Negative for dysuria and flank pain.  Musculoskeletal: Negative for neck pain.  Skin: Negative for rash.  Neurological: Negative for weakness, numbness and headaches.  Hematological: Does not bruise/bleed easily.  Psychiatric/Behavioral:  Negative for confusion.    Physical Exam Updated Vital Signs BP (!) 166/63 (BP Location: Right Arm)   Pulse 76   Temp 98.5 F (36.9 C) (Oral)   Resp 18   SpO2 97%   Physical Exam Vitals and nursing note reviewed.  Constitutional:      Appearance: Normal appearance. He is well-developed.  HENT:     Head: Atraumatic.     Nose: Nose normal.     Mouth/Throat:     Mouth: Mucous membranes are moist.     Pharynx: Oropharynx is clear.  Eyes:     General: No scleral icterus.    Conjunctiva/sclera: Conjunctivae normal.  Neck:     Trachea: No tracheal deviation.  Cardiovascular:     Rate and Rhythm: Normal rate and regular rhythm.     Pulses: Normal pulses.     Heart sounds: Normal heart sounds. No murmur. No friction rub. No gallop.   Pulmonary:     Effort: Pulmonary effort is normal. No accessory muscle usage or respiratory distress.     Breath sounds: Normal breath sounds.  Abdominal:     General: Bowel sounds are normal. There is no distension.     Palpations: Abdomen is soft. There is no mass.     Tenderness: There is no abdominal tenderness. There is no guarding or rebound.     Hernia: No hernia is present.     Comments: No pulsatile mass.   Genitourinary:    Comments: No cva tenderness. Musculoskeletal:        General: No swelling.     Cervical back: Normal range of motion and neck supple. No rigidity.     Comments: C/T/L spine non tender, aligned.   Skin:    General: Skin is warm and dry.     Findings: No rash.  Neurological:     Mental Status: He is alert.     Comments: Alert, speech clear. Motor/sens grossly intact bil   Psychiatric:        Mood and Affect: Mood normal.     ED Results / Procedures / Treatments   Labs (all labs ordered are listed, but only abnormal results are displayed) Results for orders placed or performed during the hospital encounter of 08/02/19  Respiratory Panel by RT PCR (Flu A&B, Covid) -  Nasopharyngeal Swab   Specimen:  Nasopharyngeal Swab  Result Value Ref Range   SARS Coronavirus 2 by RT PCR NEGATIVE NEGATIVE   Influenza A by PCR NEGATIVE NEGATIVE   Influenza B by PCR NEGATIVE NEGATIVE  Comprehensive metabolic panel  Result Value Ref Range   Sodium 138 135 - 145 mmol/L   Potassium 4.5 3.5 - 5.1 mmol/L   Chloride 106 98 - 111 mmol/L   CO2 21 (L) 22 - 32 mmol/L   Glucose, Bld 121 (H) 70 - 99 mg/dL   BUN 32 (H) 8 - 23 mg/dL   Creatinine, Ser 2.45 (H) 0.61 - 1.24 mg/dL   Calcium 8.9 8.9 - 10.3 mg/dL   Total Protein 6.4 (L) 6.5 - 8.1 g/dL   Albumin 3.8 3.5 - 5.0 g/dL   AST 14 (L) 15 - 41 U/L   ALT 12 0 - 44 U/L   Alkaline Phosphatase 57 38 - 126 U/L   Total Bilirubin 0.7 0.3 - 1.2 mg/dL   GFR calc non Af Amer 23 (L) >60 mL/min   GFR calc Af Amer 26 (L) >60 mL/min   Anion gap 11 5 - 15  CBC  Result Value Ref Range   WBC 11.7 (H) 4.0 - 10.5 K/uL   RBC 3.49 (L) 4.22 - 5.81 MIL/uL   Hemoglobin 10.6 (L) 13.0 - 17.0 g/dL   HCT 33.1 (L) 39.0 - 52.0 %   MCV 94.8 80.0 - 100.0 fL   MCH 30.4 26.0 - 34.0 pg   MCHC 32.0 30.0 - 36.0 g/dL   RDW 14.2 11.5 - 15.5 %   Platelets 191 150 - 400 K/uL   nRBC 0.0 0.0 - 0.2 %  Lipase, blood  Result Value Ref Range   Lipase 36 11 - 51 U/L  Brain natriuretic peptide  Result Value Ref Range   B Natriuretic Peptide 608.2 (H) 0.0 - 100.0 pg/mL  D-dimer, quantitative (not at [facility])  Result Value Ref Range   D-Dimer, Quant 0.68 (H) 0.00 - 0.50 ug/mL-FEU  Troponin I (High Sensitivity)  Result Value Ref Range   Troponin I (High Sensitivity) 20 (H) <18 ng/L  Troponin I (High Sensitivity)  Result Value Ref Range   Troponin I (High Sensitivity) 18 (H) <18 ng/L   DG Chest Port 1 View  Result Date: 08/02/2019 CLINICAL DATA:  Abdominal, back and shoulder pain. Shortness of breath. EXAM: PORTABLE CHEST 1 VIEW COMPARISON:  06/28/2016 FINDINGS: Lungs are adequately inflated with subtle hazy infrahilar opacification right worse than left which may be due to vascular congestion  less likely infection. Borderline cardiomegaly. No effusion. Remainder the exam is unchanged. IMPRESSION: 1. Mild hazy infrahilar opacification right worse than left likely due to vascular congestion although infection is possible. 2.  Borderline cardiomegaly. Electronically Signed   By: Marin Olp M.D.   On: 08/02/2019 15:51   Intravitreal Injection, Pharmacologic Agent - OD - Right Eye  Result Date: 07/23/2019 Time Out 07/23/2019. 10:52 AM. Confirmed correct patient, procedure, site, and patient consented. Anesthesia Topical anesthesia was used. Anesthetic medications included Akten 3.5%. Procedure Preparation included 10% betadine to eyelids. A 30 gauge needle was used. Injection: 1.25 mg Bevacizumab (AVASTIN) SOLN   NDC: [address]-8   Route: Intravitreal, Site: Right Eye, Waste: 0 mg Post-op Post injection exam found visual acuity of at least counting fingers. The patient tolerated the procedure well. There were no complications. The patient received written and verbal post procedure care education. Post injection medications were not given.  Intravitreal Injection, Pharmacologic Agent - OS - Left Eye  Result Date: 07/09/2019 Time Out 07/09/2019. 11:09 AM. Confirmed correct patient, procedure, site, and patient consented. Anesthesia Topical anesthesia was used. Anesthetic medications included Akten 3.5%. Procedure Preparation included 5% betadine to ocular surface, 10% betadine to eyelids, Tobramycin 0.3%. A 30 gauge needle was used. Injection: 2 mg aflibercept Alfonse Flavors) SOLN   NDC: A3590391, Lot: [id number]   Route: Intravitreal, Site: Left Eye, Waste: 0 mg Post-op Post injection exam found visual acuity of at least counting fingers. The patient tolerated the procedure well. There were no complications. The patient received written and verbal post procedure care education. Post injection medications were not given.   OCT, Retina - OU - Both Eyes  Result Date: 07/23/2019 Right Eye Quality was  good. Scan locations included subfoveal. Central Foveal Thickness: 349. Progression has improved. Findings include retinal drusen , abnormal foveal contour, subretinal scarring, cystoid macular edema, macular pucker, outer retinal atrophy, central retinal atrophy, choroidal neovascular membrane. Left Eye Quality was good. Scan locations included subfoveal. Central Foveal Thickness: 265. Progression has improved. Findings include abnormal foveal contour, outer retinal atrophy, central retinal atrophy, retinal drusen , choroidal neovascular membrane. Notes OD, stable overall.  Some foveal atrophy some perifoveal CME is chronic, will repeat Avastin OD today   EKG EKG Interpretation  Date/Time:  Sunday Aug 02 2019 15:48:49 EDT Ventricular Rate:  74 PR Interval:    QRS Duration: 134 QT Interval:  431 QTC Calculation: 479 R Axis:   -56 Text Interpretation: Sinus rhythm Multiple ventricular premature complexes RBBB and LAFB Confirmed by Lajean Saver [id number]) on 08/02/2019 3:49:59 PM   Radiology CT Chest Wo Contrast  Result Date: 08/02/2019 CLINICAL DATA:  86 year old male with chest pain and back pain. EXAM: CT CHEST WITHOUT CONTRAST TECHNIQUE: Multidetector CT imaging of the chest was performed following the standard protocol without IV contrast. COMPARISON:  Chest radiograph dated 08/02/2019. FINDINGS: Evaluation of this exam is limited in the absence of intravenous contrast. Cardiovascular: Borderline cardiomegaly. Advanced 3 vessel coronary vascular calcification. There is trace pericardial effusion measuring up to 7 mm in thickness anterior to the heart. There is atherosclerotic calcification of the aortic valve annulus. Moderate atherosclerotic calcification of the aorta. The central pulmonary arteries are grossly unremarkable. Mediastinum/Nodes: No hilar or mediastinal adenopathy. There is a small hiatal hernia. The esophagus and the thyroid gland are grossly unremarkable. No mediastinal fluid  collection. Lungs/Pleura: Small bilateral pleural effusions, right greater left. There are bibasilar linear and streaky densities which may represent atelectasis or infiltrate. Additionally scattered areas of ground-glass nodularity noted throughout the lungs also concerning for an infectious process. Clinical correlation and follow-up to resolution recommended. There is no pneumothorax. The central airways are patent. Upper Abdomen: No acute abnormality. Musculoskeletal: Degenerative changes of the spine. No acute osseous pathology. IMPRESSION: 1. Small bilateral pleural effusions with findings concerning for multifocal pneumonia. Clinical correlation and follow-up recommended. 2. Advanced 3 vessel coronary vascular calcification and Aortic Atherosclerosis (ICD10-I70.0). Electronically Signed   By: Anner Crete M.D.   On: 08/02/2019 20:13   DG Chest Port 1 View  Result Date: 08/02/2019 CLINICAL DATA:  Abdominal, back and shoulder pain. Shortness of breath. EXAM: PORTABLE CHEST 1 VIEW COMPARISON:  06/28/2016 FINDINGS: Lungs are adequately inflated with subtle hazy infrahilar opacification right worse than left which may be due to vascular congestion less likely infection. Borderline cardiomegaly. No effusion. Remainder the exam is unchanged. IMPRESSION: 1. Mild hazy infrahilar opacification right worse than left likely due to  vascular congestion although infection is possible. 2.  Borderline cardiomegaly. Electronically Signed   By: Marin Olp M.D.   On: 08/02/2019 15:51    Procedures Procedures (including critical care time)  Medications Ordered in ED Medications  morphine 4 MG/ML injection 4 mg (has no administration in time range)  ondansetron (ZOFRAN) injection 4 mg (has no administration in time range)    ED Course  I have reviewed the triage vital signs and the nursing notes.  Pertinent labs & imaging results that were available during my care of the patient were reviewed by me and  considered in my medical decision making (see chart for details).    MDM Rules/Calculators/A&P                      Iv ns. Stat labs. Morphine iv. zofran iv. Cxr. Ecg.   Reviewed nursing notes and prior charts for additional history.   CXR reviewed/interpreted by me - mild infiltrate, vascular congestion vs pna. Pt denies fever/cough. Room air pulse ox is 88-90%. bnp pending.   Initial labs reviewed/interpreted by me - trop is sl elevated. Bun/cre elevated, hx cri, sl increased from baseline. Would like to get cta re chest/back pain however, pt with CRI/AKI. Will initially send for non contrast CT.   Additional labs remain pending. Back pain persists. Morphine iv.   Repeat ecg - no acute change.   CT reviewed/interpreted by me - small effusions. Bilateral infil, ?pna. Iv antibiotics given .  Will admit to medicine re cp, mild hypoxia, infiltrate on cxr ?multifocal pna vs non infectious cause. covid test is pending.   ddimer is mildly, would consider NM study in AM (or CTA if creatinine improves).     Final Clinical Impression(s) / ED Diagnoses Final diagnoses:  None    Rx / DC Orders ED Discharge Orders    None       Lajean Saver, MD 08/02/19 2023

## 2019-08-02 NOTE — H&P (Signed)
History and Physical   Adam [name]:[id number] DOB: [birth year] DOA: 08/02/2019  Referring MD/NP/PA: Dr Ashok Cordia  PCP: Javier Glazier, MD   Patient coming from: Independent living facility at [facility]   Chief Complaint: Abdominal pain back and shoulder pain with shortness of breath  HPI: Adam [name] is a 86 y.o. male with medical history significant of BPH, diabetes, hyperlipidemia, who is a resident of independent living that was brought in secondary to back pain some abdominal pain and shortness of breath.  He denied fever but what but had chills.  Patient was seen in the ER and evaluated.  Initially being worked up for possible ACS due to mildly elevated troponin.  He reported his pain as pleuritic in nature.  Usually associated with coughing and deep breath.  It is diffuse but mainly in the shoulder region.  Has cough but no rhinorrhea and no recent antibiotics use.  Patient denied any known sick contacts.  COVID-19 screen in the ER is negative.  His work-up shows multifocal pneumonia on CT scan and is being admitted to the hospital for treatment..  ED Course: Temperature 98.5 blood pressure 166/63 pulse 78 respirate 22 oxygen sats 89% on room air.  Currently 91% 2 L.  White count 11.7 hemoglobin 10.6 and platelets 191.  Sodium 138 potassium 4.5, chloride 106 CO2 21 BUN 30 creatinine 2.45 and calcium 8.9.  Glucose 121.  BNP of 608 and troponin of 20.  D-dimer 0.68.  Glucose 121.  CT angiogram of the chest showed small bilateral pleural effusion with findings concerning for multifocal pneumonia.  Patient will be admitted and be treated for community-acquired pneumonia.  Review of Systems: As per HPI otherwise 10 point review of systems negative.    Past Medical History:  Diagnosis Date  . Acute meniscal tear of knee RIGHT KNEE  . Arthritis   . BPH (benign prostatic hypertrophy)   . Colon polyps    hyperplastic  . Diabetes mellitus type 2, diet-controlled (HCC) AND EXERCISE-- LAST A1C   5.6  . Diverticulosis   . Gait abnormality 11/08/2016  . GERD (gastroesophageal reflux disease)   . Glaucoma   . H/O hiatal hernia   . Heart murmur   . History of esophageal stricture S/P DILATION IN 2007  . Hyperlipidemia   . Hypertension   . IgM monoclonal gammopathy of uncertain significance ASYMPTOMATIC    FOLLOWED BY DR Alen Blew  . Internal hemorrhoids   . Macrocytic anemia DUE TO CHRONIC RENAL INSUFF.  . Nocturia   . Right knee pain     Past Surgical History:  Procedure Laterality Date  . CATARACT EXTRACTION W/ INTRAOCULAR LENS  IMPLANT, BILATERAL  2012  . HEMORRHOID SURGERY    . HERNIA REPAIR    . KNEE ARTHROSCOPY  12/25/2011   Procedure: ARTHROSCOPY KNEE;  Surgeon: Tobi Bastos, MD;  Location: [facility];  Service: Orthopedics;  Laterality: Right;  right knee arthroscopy with medial menisectomy       reports that he has quit smoking. His smoking use included cigarettes. He has a 10.00 pack-year smoking history. He has never used smokeless tobacco. He reports current alcohol use of about 14.0 standard drinks of alcohol per week. He reports that he does not use drugs.  No Known Allergies  Family History  Problem Relation Age of Onset  . Diabetes Brother   . Heart attack Father   . Early death Father   . Hearing loss Father   . Heart disease Father   .  Alcohol abuse Sister   . Cancer Sister      Prior to Admission medications   Medication Sig Start Date End Date Taking? Authorizing Provider  amLODipine (NORVASC) 5 MG tablet Take 1 tablet (5 mg total) by mouth daily. 05/30/18   Fay Records, MD  amLODipine (NORVASC) 5 MG tablet Take 1 tablet (5 mg total) by mouth daily. 05/30/18   Fay Records, MD  aspirin EC 325 MG EC tablet Take 1 tablet (325 mg total) by mouth daily. 05/03/18   Norval Morton, MD  atorvastatin (LIPITOR) 20 MG tablet Take 1 tablet (20 mg total) by mouth daily. 05/03/18   Norval Morton, MD  betamethasone dipropionate (DIPROLENE)  0.05 % ointment  04/27/19   [provider]  brimonidine-timolol (COMBIGAN) 0.2-0.5 % ophthalmic solution Place 1 drop into the left eye 2 (two) times daily.     [provider]  Cholecalciferol 25 MCG (1000 UT) tablet Take by mouth.    [provider]  clobetasol cream (TEMOVATE) 9.44 % Apply 1 application topically every evening. 05/03/16   [provider]  cloNIDine (CATAPRES) 0.1 MG tablet Take 1 tablet (0.1 mg total) by mouth 2 (two) times daily. 05/02/18   Fuller Plan A, MD  DORZOLAMIDE HCL-TIMOLOL MAL OP Place 1 drop into the left eye 2 (two) times daily.     [provider]  finasteride (PROSCAR) 5 MG tablet Take 5 mg by mouth daily.     [provider]  furosemide (LASIX) 20 MG tablet Take by mouth. 06/24/19   [provider]  furosemide (LASIX) 20 MG tablet Take 20 mg by mouth daily. 06/24/19   [provider]  hydrALAZINE (APRESOLINE) 25 MG tablet Take 25 mg by mouth 3 (three) times daily. 05/27/19   [provider]  labetalol (NORMODYNE) 100 MG tablet Take 100 mg by mouth 2 (two) times daily.    [provider]  latanoprost (XALATAN) 0.005 % ophthalmic solution latanoprost 0.005 % eye drops    [provider]  Multiple Vitamins-Minerals (PRESERVISION AREDS PO) Take 1 tablet by mouth 2 (two) times daily.     [provider]  Netarsudil-Latanoprost (ROCKLATAN) 0.02-0.005 % SOLN Place 1 drop into the left eye at bedtime.     [provider]  nystatin-triamcinolone (MYCOLOG II) cream Apply 1 application topically as needed (skin raah).    [provider]  omeprazole (PRILOSEC OTC) 20 MG tablet Take by mouth. 05/13/19   [provider]  SODIUM BICARBONATE PO Take 15 g by mouth 2 (two) times daily.     [provider]  Sodium Bicarbonate POWD Takes 2 capsules daily 10/01/18   [provider]  ULORIC 40 MG tablet Take 40 mg by mouth 3 (three) times a  week. Take 1 tablet by mouth on Tuesday, Friday, and Sunday    [provider]  vitamin C (ASCORBIC ACID) 500 MG tablet Take 500 mg by mouth daily.    [provider]  Vitamin D, Ergocalciferol, (DRISDOL) 1.25 MG (50000 UT) CAPS capsule Take 1 capsule (50,000 Units total) by mouth every 7 (seven) days. Patient taking differently: Take 50,000 Units by mouth every Tuesday.  04/21/18   Lyndal Pulley, DO    Physical Exam: Vitals:   08/02/19 1433 08/02/19 1445 08/02/19 1500 08/02/19 1515  BP: (!) 166/63 (!) 148/56 (!) 146/59 (!) 146/54  Pulse: 76 75 78   Resp: 18 (!) 22 (!) 22 (!) 21  Temp:  98.5 F (36.9 C)     TempSrc: Oral     SpO2: 97% (!) 89% 91%       Constitutional: Pleasant, laying in bed, no acute distress Vitals:   08/02/19 1433 08/02/19 1445 08/02/19 1500 08/02/19 1515  BP: (!) 166/63 (!) 148/56 (!) 146/59 (!) 146/54  Pulse: 76 75 78   Resp: 18 (!) 22 (!) 22 (!) 21  Temp: 98.5 F (36.9 C)     TempSrc: Oral     SpO2: 97% (!) 89% 91%    Eyes: PERRL, lids and conjunctivae normal ENMT: Mucous membranes are dry. Posterior pharynx clear of any exudate or lesions.Normal dentition.  Neck: normal, supple, no masses, no thyromegaly Respiratory: Coarse breath sounds, bilateral crackles, some rhonchi but no wheezing normal respiratory effort. No accessory muscle use.  Cardiovascular: Regular rate and rhythm, no murmurs / rubs / gallops. No extremity edema. 2+ pedal pulses. No carotid bruits.  Abdomen: no tenderness, no masses palpated. No hepatosplenomegaly. Bowel sounds positive.  Musculoskeletal: no clubbing / cyanosis. No joint deformity upper and lower extremities. Good ROM, no contractures. Normal muscle tone.  Skin: no rashes, lesions, ulcers. No induration Neurologic: CN 2-12 grossly intact. Sensation intact, DTR normal. Strength 5/5 in all 4.  Psychiatric: Normal judgment and insight. Alert and oriented x 3. Normal mood.     Labs on Admission: I have  personally reviewed following labs and imaging studies  CBC: Recent Labs  Lab 08/02/19 1623  WBC 11.7*  HGB 10.6*  HCT 33.1*  MCV 94.8  PLT 419   Basic Metabolic Panel: Recent Labs  Lab 08/02/19 1623  NA 138  K 4.5  CL 106  CO2 21*  GLUCOSE 121*  BUN 32*  CREATININE 2.45*  CALCIUM 8.9   GFR: CrCl cannot be calculated (Unknown ideal weight.). Liver Function Tests: Recent Labs  Lab 08/02/19 1623  AST 14*  ALT 12  ALKPHOS 57  BILITOT 0.7  PROT 6.4*  ALBUMIN 3.8   Recent Labs  Lab 08/02/19 1623  LIPASE 36   No results for input(s): AMMONIA in the last 168 hours. Coagulation Profile: No results for input(s): INR, PROTIME in the last 168 hours. Cardiac Enzymes: No results for input(s): CKTOTAL, CKMB, CKMBINDEX, TROPONINI in the last 168 hours. BNP (last 3 results) No results for input(s): PROBNP in the last 8760 hours. HbA1C: No results for input(s): HGBA1C in the last 72 hours. CBG: No results for input(s): GLUCAP in the last 168 hours. Lipid Profile: No results for input(s): CHOL, HDL, LDLCALC, TRIG, CHOLHDL, LDLDIRECT in the last 72 hours. Thyroid Function Tests: No results for input(s): TSH, T4TOTAL, FREET4, T3FREE, THYROIDAB in the last 72 hours. Anemia Panel: No results for input(s): VITAMINB12, FOLATE, FERRITIN, TIBC, IRON, RETICCTPCT in the last 72 hours. Urine analysis:    Component Value Date/Time   COLORURINE STRAW (A) 04/30/2018 1435   APPEARANCEUR CLEAR 04/30/2018 1435   LABSPEC 1.006 04/30/2018 1435   PHURINE 7.0 04/30/2018 1435   GLUCOSEU NEGATIVE 04/30/2018 1435   HGBUR NEGATIVE 04/30/2018 1435   BILIRUBINUR NEGATIVE 04/30/2018 1435   KETONESUR NEGATIVE 04/30/2018 1435   PROTEINUR 30 (A) 04/30/2018 1435   NITRITE NEGATIVE 04/30/2018 1435   LEUKOCYTESUR NEGATIVE 04/30/2018 1435   Sepsis Labs: @LABRCNTIP (procalcitonin:4,lacticidven:4) ) Recent Results (from the past 240 hour(s))  Respiratory Panel by RT PCR (Flu A&B, Covid) -  Nasopharyngeal Swab     Status: None   Collection Time: 08/02/19  6:18 PM   Specimen: Nasopharyngeal Swab  Result  Value Ref Range Status   SARS Coronavirus 2 by RT PCR NEGATIVE NEGATIVE Final    Comment: (NOTE) SARS-CoV-2 target nucleic acids are NOT DETECTED. The SARS-CoV-2 RNA is generally detectable in upper respiratoy specimens during the acute phase of infection. The lowest concentration of SARS-CoV-2 viral copies this assay can detect is 131 copies/mL. A negative result does not preclude SARS-Cov-2 infection and should not be used as the sole basis for treatment or other patient management decisions. A negative result may occur with  improper specimen collection/handling, submission of specimen other than nasopharyngeal swab, presence of viral mutation(s) within the areas targeted by this assay, and inadequate number of viral copies (<131 copies/mL). A negative result must be combined with clinical observations, patient history, and epidemiological information. The expected result is Negative. Fact Sheet for Patients:  PinkCheek.be Fact Sheet for Healthcare Providers:  GravelBags.it This test is not yet ap proved or cleared by the [place] FDA and  has been authorized for detection and/or diagnosis of SARS-CoV-2 by FDA under an Emergency Use Authorization (EUA). This EUA will remain  in effect (meaning this test can be used) for the duration of the COVID-19 declaration under Section 564(b)(1) of the Act, 21 U.S.C. section 360bbb-3(b)(1), unless the authorization is terminated or revoked sooner.    Influenza A by PCR NEGATIVE NEGATIVE Final   Influenza B by PCR NEGATIVE NEGATIVE Final    Comment: (NOTE) The Xpert Xpress SARS-CoV-2/FLU/RSV assay is intended as an aid in  the diagnosis of influenza from Nasopharyngeal swab specimens and  should not be used as a sole basis for treatment. Nasal washings and  aspirates  are unacceptable for Xpert Xpress SARS-CoV-2/FLU/RSV  testing. Fact Sheet for Patients: PinkCheek.be Fact Sheet for Healthcare Providers: GravelBags.it This test is not yet approved or cleared by the [place] FDA and  has been authorized for detection and/or diagnosis of SARS-CoV-2 by  FDA under an Emergency Use Authorization (EUA). This EUA will remain  in effect (meaning this test can be used) for the duration of the  Covid-19 declaration under Section 564(b)(1) of the Act, 21  U.S.C. section 360bbb-3(b)(1), unless the authorization is  terminated or revoked. Performed at [facility], [place] [address] [place],  [address]      Radiological Exams on Admission: CT Chest Wo Contrast  Result Date: 08/02/2019 CLINICAL DATA:  86 year old male with chest pain and back pain. EXAM: CT CHEST WITHOUT CONTRAST TECHNIQUE: Multidetector CT imaging of the chest was performed following the standard protocol without IV contrast. COMPARISON:  Chest radiograph dated 08/02/2019. FINDINGS: Evaluation of this exam is limited in the absence of intravenous contrast. Cardiovascular: Borderline cardiomegaly. Advanced 3 vessel coronary vascular calcification. There is trace pericardial effusion measuring up to 7 mm in thickness anterior to the heart. There is atherosclerotic calcification of the aortic valve annulus. Moderate atherosclerotic calcification of the aorta. The central pulmonary arteries are grossly unremarkable. Mediastinum/Nodes: No hilar or mediastinal adenopathy. There is a small hiatal hernia. The esophagus and the thyroid gland are grossly unremarkable. No mediastinal fluid collection. Lungs/Pleura: Small bilateral pleural effusions, right greater left. There are bibasilar linear and streaky densities which may represent atelectasis or infiltrate. Additionally scattered areas of ground-glass nodularity noted throughout the lungs  also concerning for an infectious process. Clinical correlation and follow-up to resolution recommended. There is no pneumothorax. The central airways are patent. Upper Abdomen: No acute abnormality. Musculoskeletal: Degenerative changes of the spine. No acute osseous pathology. IMPRESSION: 1. Small bilateral  pleural effusions with findings concerning for multifocal pneumonia. Clinical correlation and follow-up recommended. 2. Advanced 3 vessel coronary vascular calcification and Aortic Atherosclerosis (ICD10-I70.0). Electronically Signed   By: Anner Crete M.D.   On: 08/02/2019 20:13   DG Chest Port 1 View  Result Date: 08/02/2019 CLINICAL DATA:  Abdominal, back and shoulder pain. Shortness of breath. EXAM: PORTABLE CHEST 1 VIEW COMPARISON:  06/28/2016 FINDINGS: Lungs are adequately inflated with subtle hazy infrahilar opacification right worse than left which may be due to vascular congestion less likely infection. Borderline cardiomegaly. No effusion. Remainder the exam is unchanged. IMPRESSION: 1. Mild hazy infrahilar opacification right worse than left likely due to vascular congestion although infection is possible. 2.  Borderline cardiomegaly. Electronically Signed   By: Marin Olp M.D.   On: 08/02/2019 15:51    EKG: Independently reviewed.  It shows sinus rhythm with a rate of 81.  Evidence of right bundle branch block and left anterior fascicular block.  Assessment/Plan Principal Problem:   CAP (community acquired pneumonia) Active Problems:   HTN (hypertension)   HLD (hyperlipidemia)   Type 2 diabetes mellitus (HCC)   BPH (benign prostatic hyperplasia)   Leucocytosis   ARF (acute renal failure) (HCC)   Hypoxia     #1 community-acquired pneumonia: Patient will be admitted and initiated on Rocephin and Zithromax.  Sputum and blood cultures to be obtained.  Based on the results we will narrow down antibiotic coverage.  He is hypoxic so we will continue with oxygen.  #2 acute  hypoxia: Secondary to pneumonia above.  Continue close monitoring.  #3 acute kidney injury: Most likely due to dehydration.  Gently hydrate and monitor renal function.  #4 leukocytosis: Most likely due to the pneumonia.  Treat and monitor white count.  #5 Diabetes: Continue SSI.  #6 HTN: Controlled.  #7 Hyperlipidemia: Continue statin   DVT prophylaxis: Lovenox Code Status: Full code Family Communication: Daughter at bedside Disposition Plan: Back to independent living facility Consults called: None Admission status: Inpatient  Severity of Illness: The appropriate patient status for this patient is INPATIENT. Inpatient status is judged to be reasonable and necessary in order to provide the required intensity of service to ensure the patient's safety. The patient's presenting symptoms, physical exam findings, and initial radiographic and laboratory data in the context of their chronic comorbidities is felt to place them at high risk for further clinical deterioration. Furthermore, it is not anticipated that the patient will be medically stable for discharge from the hospital within 2 midnights of admission. The following factors support the patient status of inpatient.   " The patient's presenting symptoms include fever cough and shortness of breath. " The worrisome physical exam findings include rhonchi bilaterally. " The initial radiographic and laboratory data are worrisome because of CT chest showing bilateral pneumonia. " The chronic co-morbidities include hypertension diabetes.   * I certify that at the point of admission it is my clinical judgment that the patient will require inpatient hospital care spanning beyond 2 midnights from the point of admission due to high intensity of service, high risk for further deterioration and high frequency of surveillance required.Barbette Merino MD [facility] Pager [phone]  If 7PM-7AM, please contact  night-coverage [email or web address] Password [email or web address]  08/02/2019, 10:45 PM

## 2019-08-02 NOTE — ED Notes (Signed)
First tro just sent  Not time for another

## 2019-08-02 NOTE — ED Notes (Signed)
Med given 

## 2019-08-02 NOTE — ED Triage Notes (Signed)
Patient arrives with [facility] from [facility] facility in [place]. Pt reports abdominal pain, back and shoulder pain. Also reports increasing shortness of breath and has chills per EMS. Denies cp or fever; Pt has hx of congenital heart murmur and sees a cardiologist.  EMS vitals:  HR 70 6F temp 145/87 98% O2 on RA RR 16 CBG 131

## 2019-08-03 ENCOUNTER — Other Ambulatory Visit: Payer: Self-pay

## 2019-08-03 DIAGNOSIS — I4819 Other persistent atrial fibrillation: Secondary | ICD-10-CM

## 2019-08-03 DIAGNOSIS — I4891 Unspecified atrial fibrillation: Secondary | ICD-10-CM

## 2019-08-03 LAB — CBC WITH DIFFERENTIAL/PLATELET
Abs Immature Granulocytes: 0.02 10*3/uL (ref 0.00–0.07)
Basophils Absolute: 0.1 10*3/uL (ref 0.0–0.1)
Basophils Relative: 1 %
Eosinophils Absolute: 0.1 10*3/uL (ref 0.0–0.5)
Eosinophils Relative: 1 %
HCT: 32.5 % — ABNORMAL LOW (ref 39.0–52.0)
Hemoglobin: 10.1 g/dL — ABNORMAL LOW (ref 13.0–17.0)
Immature Granulocytes: 0 %
Lymphocytes Relative: 17 %
Lymphs Abs: 1.7 10*3/uL (ref 0.7–4.0)
MCH: 30 pg (ref 26.0–34.0)
MCHC: 31.1 g/dL (ref 30.0–36.0)
MCV: 96.4 fL (ref 80.0–100.0)
Monocytes Absolute: 1.7 10*3/uL — ABNORMAL HIGH (ref 0.1–1.0)
Monocytes Relative: 17 %
Neutro Abs: 6.5 10*3/uL (ref 1.7–7.7)
Neutrophils Relative %: 64 %
Platelets: 172 10*3/uL (ref 150–400)
RBC: 3.37 MIL/uL — ABNORMAL LOW (ref 4.22–5.81)
RDW: 14.4 % (ref 11.5–15.5)
WBC: 10.2 10*3/uL (ref 4.0–10.5)
nRBC: 0 % (ref 0.0–0.2)

## 2019-08-03 LAB — COMPREHENSIVE METABOLIC PANEL
ALT: 10 U/L (ref 0–44)
AST: 20 U/L (ref 15–41)
Albumin: 3.4 g/dL — ABNORMAL LOW (ref 3.5–5.0)
Alkaline Phosphatase: 50 U/L (ref 38–126)
Anion gap: 11 (ref 5–15)
BUN: 33 mg/dL — ABNORMAL HIGH (ref 8–23)
CO2: 21 mmol/L — ABNORMAL LOW (ref 22–32)
Calcium: 8.6 mg/dL — ABNORMAL LOW (ref 8.9–10.3)
Chloride: 105 mmol/L (ref 98–111)
Creatinine, Ser: 2.6 mg/dL — ABNORMAL HIGH (ref 0.61–1.24)
GFR calc Af Amer: 25 mL/min — ABNORMAL LOW (ref >60)
GFR calc non Af Amer: 21 mL/min — ABNORMAL LOW (ref >60)
Glucose, Bld: 112 mg/dL — ABNORMAL HIGH (ref 70–99)
Potassium: 5.5 mmol/L — ABNORMAL HIGH (ref 3.5–5.1)
Sodium: 137 mmol/L (ref 135–145)
Total Bilirubin: 1.5 mg/dL — ABNORMAL HIGH (ref 0.3–1.2)
Total Protein: 5.8 g/dL — ABNORMAL LOW (ref 6.5–8.1)

## 2019-08-03 LAB — GLUCOSE, CAPILLARY
Glucose-Capillary: 102 mg/dL — ABNORMAL HIGH (ref 70–99)
Glucose-Capillary: 107 mg/dL — ABNORMAL HIGH (ref 70–99)
Glucose-Capillary: 101 mg/dL — ABNORMAL HIGH (ref 70–99)
Glucose-Capillary: 153 mg/dL — ABNORMAL HIGH (ref 70–99)

## 2019-08-03 LAB — HEMOGLOBIN A1C
Hgb A1c MFr Bld: 5.7 % — ABNORMAL HIGH (ref 4.8–5.6)
Mean Plasma Glucose: 116.89 mg/dL

## 2019-08-03 LAB — CBG MONITORING, ED: Glucose-Capillary: 127 mg/dL — ABNORMAL HIGH (ref 70–99)

## 2019-08-03 LAB — STREP PNEUMONIAE URINARY ANTIGEN: Strep Pneumo Urinary Antigen: NEGATIVE

## 2019-08-03 LAB — HIV ANTIBODY (ROUTINE TESTING W REFLEX): HIV Screen 4th Generation wRfx: NONREACTIVE

## 2019-08-03 MED ORDER — HYDRALAZINE HCL 50 MG PO TABS
75.0000 mg | ORAL_TABLET | Freq: Three times a day (TID) | ORAL | Status: DC
  Administered 2019-08-04: 75 mg via ORAL
  Filled 2019-08-03: qty 1

## 2019-08-03 MED ORDER — HEPARIN (PORCINE) 25000 UT/250ML-% IV SOLN
1250.0000 [IU]/h | INTRAVENOUS | Status: DC
  Administered 2019-08-03: 900 [IU]/h via INTRAVENOUS
  Administered 2019-08-04 – 2019-08-05 (×2): 1100 [IU]/h via INTRAVENOUS
  Filled 2019-08-03 (×3): qty 250

## 2019-08-03 MED ORDER — ENOXAPARIN SODIUM 30 MG/0.3ML ~~LOC~~ SOLN
30.0000 mg | SUBCUTANEOUS | Status: DC
  Administered 2019-08-03: 30 mg via SUBCUTANEOUS
  Filled 2019-08-03: qty 0.3

## 2019-08-03 MED ORDER — SODIUM CHLORIDE 0.9 % IV SOLN
2.0000 g | INTRAVENOUS | Status: AC
  Administered 2019-08-03 – 2019-08-08 (×6): 2 g via INTRAVENOUS
  Filled 2019-08-03 (×2): qty 20
  Filled 2019-08-03 (×2): qty 2
  Filled 2019-08-03: qty 20
  Filled 2019-08-03 (×2): qty 2

## 2019-08-03 MED ORDER — DILTIAZEM LOAD VIA INFUSION
10.0000 mg | Freq: Once | INTRAVENOUS | Status: AC
  Administered 2019-08-03: 10 mg via INTRAVENOUS
  Filled 2019-08-03: qty 10

## 2019-08-03 MED ORDER — TIMOLOL MALEATE 0.5 % OP SOLN
1.0000 [drp] | Freq: Two times a day (BID) | OPHTHALMIC | Status: DC
  Administered 2019-08-03 – 2019-08-09 (×6): 1 [drp] via OPHTHALMIC
  Filled 2019-08-03 (×2): qty 5

## 2019-08-03 MED ORDER — DILTIAZEM HCL-DEXTROSE 125-5 MG/125ML-% IV SOLN (PREMIX)
5.0000 mg/h | INTRAVENOUS | Status: DC
  Administered 2019-08-03: 5 mg/h via INTRAVENOUS
  Filled 2019-08-03: qty 125

## 2019-08-03 MED ORDER — SODIUM CHLORIDE 0.9 % IV SOLN: INTRAVENOUS | Status: DC

## 2019-08-03 MED ORDER — BRIMONIDINE TARTRATE 0.2 % OP SOLN
1.0000 [drp] | Freq: Two times a day (BID) | OPHTHALMIC | Status: DC
  Administered 2019-08-03 – 2019-08-09 (×7): 1 [drp] via OPHTHALMIC
  Filled 2019-08-03 (×2): qty 5

## 2019-08-03 MED ORDER — SODIUM CHLORIDE 0.9 % IV SOLN
1.0000 g | INTRAVENOUS | Status: DC
  Filled 2019-08-03: qty 10

## 2019-08-03 MED ORDER — SODIUM CHLORIDE 0.9 % IV SOLN
500.0000 mg | INTRAVENOUS | Status: DC
  Administered 2019-08-04 (×2): 500 mg via INTRAVENOUS
  Filled 2019-08-03 (×2): qty 500

## 2019-08-03 NOTE — Progress Notes (Signed)
Patient and daughter reports that the code status is incorrect as Full code. The patient states he should be DNR. MD notified.

## 2019-08-03 NOTE — Progress Notes (Signed)
Per order, cardizem drip increased from 10mg  to 12.5 mg per hour due to HR of 109 and SBP greater than 110.

## 2019-08-03 NOTE — ED Notes (Signed)
Attempt report x1  

## 2019-08-03 NOTE — Progress Notes (Signed)
Per order, Cardizem drip was increased 5mg  to 10mg  per HR because at 30 minute interval HR was still over 115 and SBP greater than 110.

## 2019-08-03 NOTE — Significant Event (Signed)
Notified of HR sustaining >100 and frequently in 140s with stable BP.   Patient has hx of DM, HTN, and HLD, was admitted last night with CAP, acute hypoxic respiratory failure, and AKI on CKD III, had been in SR until this evening.   There does not appear to be any history of AF. This was likely precipitated but the acute pulmonary disease. He had severe left atrial enlargement on echo from Jan 2020. CHADS-VASc is at least 29 (age x2, HTN, DM).   Patient is well-appearing, not in any distress, denies chest pain but has noted palpitations for the past few hours. He denies any history of bleeding, understands risks of anticoagulation, and agrees with starting IV heparin now for stroke prevention.   Plan to start IV diltiazem and IV heparin now, check mag level and TSH. Also, patient notes that he wants to be DNR and asks that we correct this in the EMR.

## 2019-08-03 NOTE — ED Notes (Signed)
Pt. Offered toileting, urinal provided, chux place under patient.

## 2019-08-03 NOTE — Progress Notes (Signed)
ANTICOAGULATION CONSULT NOTE - Initial Consult  Pharmacy Consult for heparin Indication: atrial fibrillation  No Known Allergies  Patient Measurements: Height: 5\' 4"  (162.6 cm) Weight: 72.8 kg (160 lb 7.9 oz) IBW/kg (Calculated) : 59.2   Vital Signs: Temp: 99.7 F (37.6 C) (05/03 2028) Temp Source: Oral (05/03 2028) BP: 152/88 (05/03 2028) Pulse Rate: 142 (05/03 2028)  Labs: Recent Labs    08/02/19 1623 08/02/19 1841 08/03/19 0326 08/03/19 0448  HGB 10.6*  --   --  10.1*  HCT 33.1*  --   --  32.5*  PLT 191  --   --  172  CREATININE 2.45*  --  2.60*  --   TROPONINIHS 20* 18*  --   --     Estimated Creatinine Clearance: 18.3 mL/min (A) (by C-G formula based on SCr of 2.6 mg/dL (H)).   Medical History: Past Medical History:  Diagnosis Date  . Acute meniscal tear of knee RIGHT KNEE  . Arthritis   . BPH (benign prostatic hypertrophy)   . Colon polyps    hyperplastic  . Diabetes mellitus type 2, diet-controlled (HCC) AND EXERCISE-- LAST A1C  5.6  . Diverticulosis   . Gait abnormality 11/08/2016  . GERD (gastroesophageal reflux disease)   . Glaucoma   . H/O hiatal hernia   . Heart murmur   . History of esophageal stricture S/P DILATION IN 2007  . Hyperlipidemia   . Hypertension   . IgM monoclonal gammopathy of uncertain significance ASYMPTOMATIC    FOLLOWED BY DR Alen Blew  . Internal hemorrhoids   . Macrocytic anemia DUE TO CHRONIC RENAL INSUFF.  . Nocturia   . Right knee pain      Assessment: 89yom admitted with multifocal pna - started on abx. Now in new Afib RVR started on diltiazem drip.  Pharmacy asked to dose heparin for anticoagulation.  Hgb 10 pltc 170s monitors/s bleeding   Goal of Therapy:  Heparin level 0.3-0.7 units/ml Monitor platelets by anticoagulation protocol: Yes   Plan:  No bolus Begin heparin drip 900 uts/hr Daily HL, CBC  Bonnita Nasuti Pharm.D. CPP, BCPS Clinical Pharmacist [phone] 08/03/2019 9:13 PM

## 2019-08-03 NOTE — Progress Notes (Signed)
PROGRESS NOTE    Patient: Adam [name]                            PCP: Javier Glazier, MD                    DOB: 1930/11/03            DOA: 08/02/2019 ERD:[id number]             DOS: 08/03/2019, 12:29 PM   LOS: 1 day   Date of Service: The patient was seen and examined on 08/03/2019  Subjective:   The patient was seen and examined this morning Stable -currently on 2 L of oxygen, satting 97%. Still complaining of Mild shortness of breath, on supplemental oxygen,  denies any chest pain  -Daughter present at bed side. -  Brief Narrative:   Adam [name] is a 86 y.o. male with medical history significant of BPH, diabetes, hyperlipidemia, who is a resident of independent living that was brought in secondary to back pain some abdominal pain and shortness of breath. SARS-CoV-2 negative, patient confirmed status post 2 dose of Covid vaccine. On presentation was satting 89% on room air, with 2 L, improved to 91%. CT angiogram of the chest showed small bilateral pleural effusion with findings concerning for multifocal pneumonia.    Assessment & Plan:   Principal Problem:   CAP (community acquired pneumonia) Active Problems:   HTN (hypertension)   HLD (hyperlipidemia)   Type 2 diabetes mellitus (HCC)   BPH (benign prostatic hyperplasia)   Leucocytosis   ARF (acute renal failure) (HCC)   Hypoxia   community-acquired pneumonia/mild respiratory distress/hypoxemia -Associated with mild hypoxemia -CT angiogram of the chest showed small bilateral pleural effusion with findings concerning for multifocal pneumonia.   -Patient will be continue to monitor on monitored bed -We will follow up with sputum and blood cultures >> -Patient has been initiated on IV antibiotics Rocephin azithromycin will be continued -Continue O2 supplements, on 2 L satting 91%, with plan to wean off oxygen -DuoNeb bronchodilator as needed     Acute kidney injury:  -Likely due to dehydration, -The patient and  daughter confirms that PCP recently started the patient on Lasix due to lower extremity edema -Lasix on hold -Gentle IV fluid hydration -We will monitor kidney function closely -Creatinine 2.4 >>2.60 6 >>   Mildly elevated troponin -No changes in EKG -Likely ischemic demand, -Denies any chest pain -Patient recently had an echo as an outpatient  Diabetes mellitus type 2 -Not on any medications at home -Diet controlled -Diabetic diet -Checking CBG QA CHS, with SSI coverage  Hypertension -Currently stable, resuming home medication of hydralazine, labetalol -According to daughter dose at home was 300 twice daily, currently at 100 twice daily -We will monitor closely -We will holding Lasix  Hyperlipidemia Continue statins.  BPH -Continue Proscar    Nutritional status:         Cultures; Blood Cultures x 2 >> NGT Urine Culture  >>> NGT  Sputum Culture >> NGT   Antimicrobials: 08/02/2019 -IV antibiotics of Rocephin/azithromycin >>   Consultants: None   -----------------------------------------------------------------------------------------------------------------  DVT prophylaxis: SCD/Compression stockings and Lovenox SQ Code Status: DNR-confirmed by daughter at bedside. Family Communication: Daughter present at bedside.  The above findings and plan of care has been discussed with patient (and his daughter)  in detail,  they expressed understanding and agreement of above. -CODE STATUS and extensive medical intervention  was discussed with the patient and her daughter at bedside.  They both confirm DNR status.   Admission status:   Status is: Inpatient  Remains inpatient appropriate because:Inpatient level of care appropriate due to severity of illness   Dispo: The patient is from: [facility]              Anticipated d/c is to: ALF              Anticipated d/c date is: 2 days              Patient currently is not medically stable to  d/c.       Procedures:   No admission procedures for hospital encounter.     Antimicrobials:  Anti-infectives (From admission, onward)   Start     Dose/Rate Route Frequency Ordered Stop   08/04/19 0000  azithromycin (ZITHROMAX) 500 mg in sodium chloride 0.9 % 250 mL IVPB     500 mg 250 mL/hr over 60 Minutes Intravenous Every 24 hours 08/03/19 0325     08/03/19 2200  cefTRIAXone (ROCEPHIN) 1 g in sodium chloride 0.9 % 100 mL IVPB     1 g 200 mL/hr over 30 Minutes Intravenous Every 24 hours 08/03/19 0325     08/02/19 2030  cefTRIAXone (ROCEPHIN) 2 g in sodium chloride 0.9 % 100 mL IVPB  Status:  Discontinued     2 g 200 mL/hr over 30 Minutes Intravenous Every 24 hours 08/02/19 2019 08/03/19 0349   08/02/19 2030  azithromycin (ZITHROMAX) 500 mg in sodium chloride 0.9 % 250 mL IVPB  Status:  Discontinued     500 mg 250 mL/hr over 60 Minutes Intravenous Every 24 hours 08/02/19 2019 08/03/19 0350       Medication:  . vitamin C  500 mg Oral Daily  . aspirin  81 mg Oral Daily  . atorvastatin  20 mg Oral Daily  . timolol  1 drop Left Eye BID   And  . brimonidine  1 drop Left Eye BID  . cholecalciferol  2,000 Units Oral Daily  . dorzolamide-timolol  1 drop Left Eye BID  . enoxaparin (LOVENOX) injection  30 mg Subcutaneous Q24H  . [START ON 08/04/2019] febuxostat  40 mg Oral Once per day on Sun Tue Fri  . finasteride  5 mg Oral Daily  . hydrALAZINE  75 mg Oral TID  . insulin aspart  0-5 Units Subcutaneous QHS  . insulin aspart  0-9 Units Subcutaneous TID WC  . labetalol  100 mg Oral BID  . latanoprost  1 drop Both Eyes QHS  . multivitamin  1 tablet Oral BID  . Netarsudil-Latanoprost  1 drop Left Eye QHS  . pantoprazole  40 mg Oral Daily  . sodium bicarbonate  1,300 mg Oral BID  . triamcinolone cream   Topical BID    furosemide   Objective:   Vitals:   08/03/19 0946 08/03/19 0948 08/03/19 1048 08/03/19 1052  BP: (!) 141/49 (!) 141/79  (!) 148/66  Pulse: 71   72   Resp:      Temp:    97.6 F (36.4 C)  TempSrc:    Oral  SpO2:    97%  Weight:   72.8 kg   Height:   5\' 4"  (1.626 m)    No intake or output data in the 24 hours ending 08/03/19 1229 Filed Weights   08/03/19 0315 08/03/19 1048  Weight: 72.6 kg 72.8 kg     Examination:  Physical Exam  Constitution:  Alert, cooperative, no distress,  Appears calm and comfortable  Psychiatric: Normal and stable mood and affect, cognition intact,   HEENT: Normocephalic, PERRL, otherwise with in Normal limits  Chest:Chest symmetric Cardio vascular:  S1/S2, RRR, No murmure, No Rubs or Gallops  pulmonary: Clear to auscultation bilaterally, respirations unlabored, negative wheezes / crackles Abdomen: Soft, non-tender, non-distended, bowel sounds,no masses, no organomegaly Muscular skeletal: Limited exam - in bed, able to move all 4 extremities, Normal strength,  Neuro: CNII-XII intact. , normal motor and sensation, reflexes intact  Extremities: No pitting edema lower extremities, +2 pulses  Skin: Dry, warm to touch, negative for any Rashes, No open wounds Wounds: per nursing documentation    ------------------------------------------------------------------------------------------------------------------------------------------    LABs:  CBC Latest Ref Rng & Units 08/03/2019 08/02/2019 04/30/2018  WBC 4.0 - 10.5 K/uL 10.2 11.7(H) 6.9  Hemoglobin 13.0 - 17.0 g/dL 10.1(L) 10.6(L) 10.7(L)  Hematocrit 39.0 - 52.0 % 32.5(L) 33.1(L) 33.0(L)  Platelets 150 - 400 K/uL 172 191 156   CMP Latest Ref Rng & Units 08/03/2019 08/02/2019 05/02/2018  Glucose 70 - 99 mg/dL 112(H) 121(H) 113(H)  BUN 8 - 23 mg/dL 33(H) 32(H) 30(H)  Creatinine 0.61 - 1.24 mg/dL 2.60(H) 2.45(H) 1.69(H)  Sodium 135 - 145 mmol/L 137 138 139  Potassium 3.5 - 5.1 mmol/L 5.5(H) 4.5 4.1  Chloride 98 - 111 mmol/L 105 106 109  CO2 22 - 32 mmol/L 21(L) 21(L) 20(L)  Calcium 8.9 - 10.3 mg/dL 8.6(L) 8.9 9.0  Total Protein 6.5 - 8.1 g/dL 5.8(L)  6.4(L) -  Total Bilirubin 0.3 - 1.2 mg/dL 1.5(H) 0.7 -  Alkaline Phos 38 - 126 U/L 50 57 -  AST 15 - 41 U/L 20 14(L) -  ALT 0 - 44 U/L 10 12 -       Micro Results Recent Results (from the past 240 hour(s))  Respiratory Panel by RT PCR (Flu A&B, Covid) - Nasopharyngeal Swab     Status: None   Collection Time: 08/02/19  6:18 PM   Specimen: Nasopharyngeal Swab  Result Value Ref Range Status   SARS Coronavirus 2 by RT PCR NEGATIVE NEGATIVE Final    Comment: (NOTE) SARS-CoV-2 target nucleic acids are NOT DETECTED. The SARS-CoV-2 RNA is generally detectable in upper respiratoy specimens during the acute phase of infection. The lowest concentration of SARS-CoV-2 viral copies this assay can detect is 131 copies/mL. A negative result does not preclude SARS-Cov-2 infection and should not be used as the sole basis for treatment or other patient management decisions. A negative result may occur with  improper specimen collection/handling, submission of specimen other than nasopharyngeal swab, presence of viral mutation(s) within the areas targeted by this assay, and inadequate number of viral copies (<131 copies/mL). A negative result must be combined with clinical observations, patient history, and epidemiological information. The expected result is Negative. Fact Sheet for Patients:  PinkCheek.be Fact Sheet for Healthcare Providers:  GravelBags.it This test is not yet ap proved or cleared by the [place] FDA and  has been authorized for detection and/or diagnosis of SARS-CoV-2 by FDA under an Emergency Use Authorization (EUA). This EUA will remain  in effect (meaning this test can be used) for the duration of the COVID-19 declaration under Section 564(b)(1) of the Act, 21 U.S.C. section 360bbb-3(b)(1), unless the authorization is terminated or revoked sooner.    Influenza A by PCR NEGATIVE NEGATIVE Final   Influenza B by  PCR NEGATIVE NEGATIVE Final    Comment: (NOTE)  The Xpert Xpress SARS-CoV-2/FLU/RSV assay is intended as an aid in  the diagnosis of influenza from Nasopharyngeal swab specimens and  should not be used as a sole basis for treatment. Nasal washings and  aspirates are unacceptable for Xpert Xpress SARS-CoV-2/FLU/RSV  testing. Fact Sheet for Patients: PinkCheek.be Fact Sheet for Healthcare Providers: GravelBags.it This test is not yet approved or cleared by the [place] FDA and  has been authorized for detection and/or diagnosis of SARS-CoV-2 by  FDA under an Emergency Use Authorization (EUA). This EUA will remain  in effect (meaning this test can be used) for the duration of the  Covid-19 declaration under Section 564(b)(1) of the Act, 21  U.S.C. section 360bbb-3(b)(1), unless the authorization is  terminated or revoked. Performed at [facility], [place] [address] [place], [place] [address]     Radiology Reports CT Chest Wo Contrast  Result Date: 08/02/2019 CLINICAL DATA:  86 year old male with chest pain and back pain. EXAM: CT CHEST WITHOUT CONTRAST TECHNIQUE: Multidetector CT imaging of the chest was performed following the standard protocol without IV contrast. COMPARISON:  Chest radiograph dated 08/02/2019. FINDINGS: Evaluation of this exam is limited in the absence of intravenous contrast. Cardiovascular: Borderline cardiomegaly. Advanced 3 vessel coronary vascular calcification. There is trace pericardial effusion measuring up to 7 mm in thickness anterior to the heart. There is atherosclerotic calcification of the aortic valve annulus. Moderate atherosclerotic calcification of the aorta. The central pulmonary arteries are grossly unremarkable. Mediastinum/Nodes: No hilar or mediastinal adenopathy. There is a small hiatal hernia. The esophagus and the thyroid gland are grossly unremarkable. No mediastinal fluid  collection. Lungs/Pleura: Small bilateral pleural effusions, right greater left. There are bibasilar linear and streaky densities which may represent atelectasis or infiltrate. Additionally scattered areas of ground-glass nodularity noted throughout the lungs also concerning for an infectious process. Clinical correlation and follow-up to resolution recommended. There is no pneumothorax. The central airways are patent. Upper Abdomen: No acute abnormality. Musculoskeletal: Degenerative changes of the spine. No acute osseous pathology. IMPRESSION: 1. Small bilateral pleural effusions with findings concerning for multifocal pneumonia. Clinical correlation and follow-up recommended. 2. Advanced 3 vessel coronary vascular calcification and Aortic Atherosclerosis (ICD10-I70.0). Electronically Signed   By: Anner Crete M.D.   On: 08/02/2019 20:13   DG Chest Port 1 View  Result Date: 08/02/2019 CLINICAL DATA:  Abdominal, back and shoulder pain. Shortness of breath. EXAM: PORTABLE CHEST 1 VIEW COMPARISON:  06/28/2016 FINDINGS: Lungs are adequately inflated with subtle hazy infrahilar opacification right worse than left which may be due to vascular congestion less likely infection. Borderline cardiomegaly. No effusion. Remainder the exam is unchanged. IMPRESSION: 1. Mild hazy infrahilar opacification right worse than left likely due to vascular congestion although infection is possible. 2.  Borderline cardiomegaly. Electronically Signed   By: Marin Olp M.D.   On: 08/02/2019 15:51   Intravitreal Injection, Pharmacologic Agent - OD - Right Eye  Result Date: 07/23/2019 Time Out 07/23/2019. 10:52 AM. Confirmed correct patient, procedure, site, and patient consented. Anesthesia Topical anesthesia was used. Anesthetic medications included Akten 3.5%. Procedure Preparation included 10% betadine to eyelids. A 30 gauge needle was used. Injection: 1.25 mg Bevacizumab (AVASTIN) SOLN   NDC: [address]-6   Route:  Intravitreal, Site: Right Eye, Waste: 0 mg Post-op Post injection exam found visual acuity of at least counting fingers. The patient tolerated the procedure well. There were no complications. The patient received written and verbal post procedure care education. Post injection medications were not given.  Intravitreal Injection, Pharmacologic Agent - OS - Left Eye  Result Date: 07/09/2019 Time Out 07/09/2019. 11:09 AM. Confirmed correct patient, procedure, site, and patient consented. Anesthesia Topical anesthesia was used. Anesthetic medications included Akten 3.5%. Procedure Preparation included 5% betadine to ocular surface, 10% betadine to eyelids, Tobramycin 0.3%. A 30 gauge needle was used. Injection: 2 mg aflibercept Alfonse Flavors) SOLN   NDC: A3590391, Lot: [id number]   Route: Intravitreal, Site: Left Eye, Waste: 0 mg Post-op Post injection exam found visual acuity of at least counting fingers. The patient tolerated the procedure well. There were no complications. The patient received written and verbal post procedure care education. Post injection medications were not given.   OCT, Retina - OU - Both Eyes  Result Date: 07/23/2019 Right Eye Quality was good. Scan locations included subfoveal. Central Foveal Thickness: 349. Progression has improved. Findings include retinal drusen , abnormal foveal contour, subretinal scarring, cystoid macular edema, macular pucker, outer retinal atrophy, central retinal atrophy, choroidal neovascular membrane. Left Eye Quality was good. Scan locations included subfoveal. Central Foveal Thickness: 265. Progression has improved. Findings include abnormal foveal contour, outer retinal atrophy, central retinal atrophy, retinal drusen , choroidal neovascular membrane. Notes OD, stable overall.  Some foveal atrophy some perifoveal CME is chronic, will repeat Avastin OD today   SIGNED: Deatra James, MD, FACP, FHM. [facility],  Pager (please use [email or web address] to  page/text)  If 7PM-7AM, please contact night-coverage [email or web address] [email or web address] [facility] 08/03/2019, 12:29 PM

## 2019-08-04 ENCOUNTER — Inpatient Hospital Stay (HOSPITAL_COMMUNITY): Payer: Medicare Other

## 2019-08-04 DIAGNOSIS — I350 Nonrheumatic aortic (valve) stenosis: Secondary | ICD-10-CM | POA: Diagnosis not present

## 2019-08-04 DIAGNOSIS — I4891 Unspecified atrial fibrillation: Secondary | ICD-10-CM

## 2019-08-04 DIAGNOSIS — I340 Nonrheumatic mitral (valve) insufficiency: Secondary | ICD-10-CM | POA: Diagnosis not present

## 2019-08-04 DIAGNOSIS — E785 Hyperlipidemia, unspecified: Secondary | ICD-10-CM | POA: Diagnosis not present

## 2019-08-04 DIAGNOSIS — N179 Acute kidney failure, unspecified: Secondary | ICD-10-CM

## 2019-08-04 DIAGNOSIS — J189 Pneumonia, unspecified organism: Principal | ICD-10-CM

## 2019-08-04 LAB — BASIC METABOLIC PANEL
Anion gap: 8 (ref 5–15)
BUN: 33 mg/dL — ABNORMAL HIGH (ref 8–23)
CO2: 24 mmol/L (ref 22–32)
Calcium: 8.3 mg/dL — ABNORMAL LOW (ref 8.9–10.3)
Chloride: 108 mmol/L (ref 98–111)
Creatinine, Ser: 2.59 mg/dL — ABNORMAL HIGH (ref 0.61–1.24)
GFR calc Af Amer: 25 mL/min — ABNORMAL LOW (ref >60)
GFR calc non Af Amer: 21 mL/min — ABNORMAL LOW (ref >60)
Glucose, Bld: 122 mg/dL — ABNORMAL HIGH (ref 70–99)
Potassium: 4.3 mmol/L (ref 3.5–5.1)
Sodium: 140 mmol/L (ref 135–145)

## 2019-08-04 LAB — HEPARIN LEVEL (UNFRACTIONATED)
Heparin Unfractionated: 0.34 IU/mL (ref 0.30–0.70)
Heparin Unfractionated: 0.21 IU/mL — ABNORMAL LOW (ref 0.30–0.70)
Heparin Unfractionated: 0.34 IU/mL (ref 0.30–0.70)

## 2019-08-04 LAB — URINALYSIS, COMPLETE (UACMP) WITH MICROSCOPIC
Bacteria, UA: NONE SEEN
Bilirubin Urine: NEGATIVE
Glucose, UA: NEGATIVE mg/dL
Hgb urine dipstick: NEGATIVE
Ketones, ur: NEGATIVE mg/dL
Leukocytes,Ua: NEGATIVE
Nitrite: NEGATIVE
Protein, ur: 100 mg/dL — AB
Specific Gravity, Urine: 1.014 (ref 1.005–1.030)
pH: 6 (ref 5.0–8.0)

## 2019-08-04 LAB — GLUCOSE, CAPILLARY
Glucose-Capillary: 113 mg/dL — ABNORMAL HIGH (ref 70–99)
Glucose-Capillary: 128 mg/dL — ABNORMAL HIGH (ref 70–99)
Glucose-Capillary: 111 mg/dL — ABNORMAL HIGH (ref 70–99)
Glucose-Capillary: 120 mg/dL — ABNORMAL HIGH (ref 70–99)

## 2019-08-04 LAB — TSH: TSH: 2.985 u[IU]/mL (ref 0.350–4.500)

## 2019-08-04 LAB — ECHOCARDIOGRAM COMPLETE
Height: 64 in
Weight: 2567.92 oz

## 2019-08-04 LAB — URIC ACID: Uric Acid, Serum: 5.6 mg/dL (ref 3.7–8.6)

## 2019-08-04 LAB — CREATININE, URINE, RANDOM: Creatinine, Urine: 99.53 mg/dL

## 2019-08-04 LAB — LACTIC ACID, PLASMA
Lactic Acid, Venous: 1.2 mmol/L (ref 0.5–1.9)
Lactic Acid, Venous: 1.4 mmol/L (ref 0.5–1.9)

## 2019-08-04 LAB — PROCALCITONIN: Procalcitonin: 0.94 ng/mL

## 2019-08-04 LAB — MAGNESIUM: Magnesium: 2 mg/dL (ref 1.7–2.4)

## 2019-08-04 LAB — SODIUM, URINE, RANDOM: Sodium, Ur: 78 mmol/L

## 2019-08-04 LAB — T4, FREE: Free T4: 1.28 ng/dL — ABNORMAL HIGH (ref 0.61–1.12)

## 2019-08-04 LAB — BRAIN NATRIURETIC PEPTIDE: B Natriuretic Peptide: 1116.7 pg/mL — ABNORMAL HIGH (ref 0.0–100.0)

## 2019-08-04 MED ORDER — SODIUM CHLORIDE 0.9 % IV SOLN: INTRAVENOUS | Status: DC

## 2019-08-04 MED ORDER — METOPROLOL TARTRATE 5 MG/5ML IV SOLN
5.0000 mg | INTRAVENOUS | Status: DC | PRN
  Administered 2019-08-04 – 2019-08-06 (×6): 5 mg via INTRAVENOUS
  Filled 2019-08-04 (×7): qty 5

## 2019-08-04 MED ORDER — METOPROLOL TARTRATE 12.5 MG HALF TABLET
12.5000 mg | ORAL_TABLET | Freq: Two times a day (BID) | ORAL | Status: DC
  Administered 2019-08-04 – 2019-08-08 (×9): 12.5 mg via ORAL
  Filled 2019-08-04 (×9): qty 1

## 2019-08-04 MED ORDER — ACETAMINOPHEN 325 MG PO TABS
650.0000 mg | ORAL_TABLET | Freq: Four times a day (QID) | ORAL | Status: DC | PRN
  Administered 2019-08-04 – 2019-08-09 (×3): 650 mg via ORAL
  Filled 2019-08-04 (×3): qty 2

## 2019-08-04 MED ORDER — HYDRALAZINE HCL 50 MG PO TABS
50.0000 mg | ORAL_TABLET | Freq: Three times a day (TID) | ORAL | Status: DC | PRN
  Administered 2019-08-05 – 2019-08-07 (×3): 50 mg via ORAL
  Filled 2019-08-04 (×3): qty 1

## 2019-08-04 MED ORDER — HYDRALAZINE HCL 50 MG PO TABS
50.0000 mg | ORAL_TABLET | Freq: Three times a day (TID) | ORAL | Status: DC
  Administered 2019-08-04 – 2019-08-06 (×6): 50 mg via ORAL
  Filled 2019-08-04 (×6): qty 1

## 2019-08-04 MED ORDER — METOPROLOL TARTRATE 5 MG/5ML IV SOLN
5.0000 mg | Freq: Once | INTRAVENOUS | Status: AC
  Administered 2019-08-04: 5 mg via INTRAVENOUS

## 2019-08-04 MED ORDER — ALBUTEROL SULFATE (2.5 MG/3ML) 0.083% IN NEBU
INHALATION_SOLUTION | RESPIRATORY_TRACT | Status: AC
  Administered 2019-08-04: 2.5 mg
  Filled 2019-08-04: qty 3

## 2019-08-04 NOTE — Progress Notes (Signed)
ANTICOAGULATION CONSULT NOTE - Follow Up Consult  Pharmacy Consult for IV Heparin Indication: atrial fibrillation (New onset)  No Known Allergies  Patient Measurements: Height: 5\' 4"  (162.6 cm) Weight: 72.8 kg (160 lb 7.9 oz) IBW/kg (Calculated) : 59.2 Heparin Dosing Weight: 72.8 kg  Vital Signs: Temp: 98.4 F (36.9 C) (05/04 1703) BP: 164/88 (05/04 1703) Pulse Rate: 89 (05/04 1703)  Labs: Recent Labs    08/02/19 1623 08/02/19 1841 08/03/19 0326 08/03/19 0448 08/04/19 0342 08/04/19 0933 08/04/19 1823  HGB 10.6*  --   --  10.1*  --   --   --   HCT 33.1*  --   --  32.5*  --   --   --   PLT 191  --   --  172  --   --   --   HEPARINUNFRC  --   --   --   --  0.34 0.21* 0.34  CREATININE 2.45*  --  2.60*  --  2.59*  --   --   TROPONINIHS 20* 18*  --   --   --   --   --     Estimated Creatinine Clearance: 18.4 mL/min (A) (by C-G formula based on SCr of 2.59 mg/dL (H)).   Medications:  Infusions:  . azithromycin 500 mg (08/04/19 0024)  . cefTRIAXone (ROCEPHIN)  IV 2 g (08/03/19 2208)  . heparin 1,100 Units/hr (08/04/19 1300)    Assessment: 86 year old on IV heparin for new onset atrial fibrillation.  Heparin drip rate 1100 uts/hr HL 0.34 at goal  CBC is stable. No bleeding reported.   Goal of Therapy:  Heparin level 0.3-0.7 units/ml Monitor platelets by anticoagulation protocol: Yes   Plan:  Continue Heparin drip 1100 units/hr.  Daily heparin level and CBC while on therapy.    Bonnita Nasuti Pharm.D. CPP, BCPS Clinical Pharmacist [phone] 08/04/2019 7:37 PM

## 2019-08-04 NOTE — Progress Notes (Signed)
ANTICOAGULATION CONSULT NOTE - Follow Up Consult  Pharmacy Consult for heparin Indication: atrial fibrillation  Labs: Recent Labs    08/02/19 1623 08/02/19 1841 08/03/19 0326 08/03/19 0448 08/04/19 0342  HGB 10.6*  --   --  10.1*  --   HCT 33.1*  --   --  32.5*  --   PLT 191  --   --  172  --   HEPARINUNFRC  --   --   --   --  0.34  CREATININE 2.45*  --  2.60*  --   --   TROPONINIHS 20* 18*  --   --   --     Assessment/Plan:  86yo male therapeutic on heparin with initial dosing for Afib. Will continue gtt at current rate and confirm stable with additional level.   Wynona Neat, PharmD, BCPS  08/04/2019,4:13 AM

## 2019-08-04 NOTE — Progress Notes (Signed)
  Echocardiogram 2D Echocardiogram has been performed.  Adam [name] 08/04/2019, 4:07 PM

## 2019-08-04 NOTE — Consult Note (Addendum)
Reason for Consult:AKI Referring Physician: Deatra James, MD  Adam [name] is an 86 y.o. male.  HPI: Per HPI on admission:  Adam [name] is a 86 y.o. male with PMH significant of BPH (Finasteride 5mg ), diabetes, HLD (treated wit Lipitor 20, ASA 81),CKD stage IIIb (baseline Scr 1.7-2.1), MGUS, Grade II diastolic dysfunction, and gout  who is a resident of independent living that was brought in secondary to back pain some abdominal pain and shortness of breath.  He denied fever but had chills... pleuritic pain with coughing and deep breath, no rhinorrhea, no recent antibiotic use, denies sick contacts. COVID-19 screen in the ER is negative. CT Chest without contrast shows multifocal Pneumonia.  Since admission patient has developed Afib with RVR, has no history of such. Currently rate controlled at 90-110 bpm. Was started on Cardizem drip which has sense been discontinued, now on Metoprolol tartrate 12.5mg  BID. BNP has increased over hospitalization from 600 up to 1100 after receiving IV fluids.  Patient with elevated blood pressure 160/80 on admission that decreased to 401 systolic when Afib with RBR started.   He denies any N/V/D, hematuria, dysuria, pyuria, urgency, frequency, or retention but does have nocturia X6.  No NSAIDs/COX-II I's, ACE/ARB.  He admits to not feeling well for the last week but reports good po intake.  His only other complaint since admission is right ankle pain and inability to bear weight on it.  He did have an episode of A fib with RVR and relative hypotension yesterday.  The trend in Scr is seen below.  Trend in Creatinine:  Creatinine, Ser  Date/Time Value Ref Range Status  08/04/2019 03:42 AM 2.59 (H) 0.61 - 1.24 mg/dL Final  08/03/2019 03:26 AM 2.60 (H) 0.61 - 1.24 mg/dL Final  08/02/2019 04:23 PM 2.45 (H) 0.61 - 1.24 mg/dL Final  05/02/2018 08:42 AM 1.69 (H) 0.61 - 1.24 mg/dL Final  04/30/2018 02:45 PM 2.12 (H) 0.61 - 1.24 mg/dL Final  06/28/2016 10:58 AM 2.02  (H) 0.61 - 1.24 mg/dL Final  12/25/2011 11:03 AM 1.70 (H) 0.50 - 1.35 mg/dL Final  12/21/2011 10:11 AM 1.67 (H) 0.50 - 1.35 mg/dL Final  10/16/2011 01:36 PM 1.95 (H) 0.50 - 1.35 mg/dL Final    PMH:   Past Medical History:  Diagnosis Date  . Acute meniscal tear of knee RIGHT KNEE  . Arthritis   . BPH (benign prostatic hypertrophy)   . Colon polyps    hyperplastic  . Diabetes mellitus type 2, diet-controlled (HCC) AND EXERCISE-- LAST A1C  5.6  . Diverticulosis   . Gait abnormality 11/08/2016  . GERD (gastroesophageal reflux disease)   . Glaucoma   . H/O hiatal hernia   . Heart murmur   . History of esophageal stricture S/P DILATION IN 2007  . Hyperlipidemia   . Hypertension   . IgM monoclonal gammopathy of uncertain significance ASYMPTOMATIC    FOLLOWED BY DR Alen Blew  . Internal hemorrhoids   . Macrocytic anemia DUE TO CHRONIC RENAL INSUFF.  . Nocturia   . Right knee pain     PSH:   Past Surgical History:  Procedure Laterality Date  . CATARACT EXTRACTION W/ INTRAOCULAR LENS  IMPLANT, BILATERAL  2012  . HEMORRHOID SURGERY    . HERNIA REPAIR    . KNEE ARTHROSCOPY  12/25/2011   Procedure: ARTHROSCOPY KNEE;  Surgeon: Tobi Bastos, MD;  Location: [facility];  Service: Orthopedics;  Laterality: Right;  right knee arthroscopy with medial menisectomy  Allergies: No Known Allergies  Medications:   Prior to Admission medications   Medication Sig Start Date End Date Taking? Authorizing Provider  aspirin EC 81 MG tablet Take 81 mg by mouth daily.   Yes [provider]  atorvastatin (LIPITOR) 20 MG tablet Take 1 tablet (20 mg total) by mouth daily. 05/03/18  Yes Smith, Eustaquio Boyden A, MD  betamethasone dipropionate (DIPROLENE) 0.05 % ointment Apply topically daily.  04/27/19  Yes [provider]  brimonidine-timolol (COMBIGAN) 0.2-0.5 % ophthalmic solution Place 1 drop into the left eye 2 (two) times daily.    Yes [provider]  clobetasol  cream (TEMOVATE) 8.41 % Apply 1 application topically every evening. 05/03/16  Yes [provider]  finasteride (PROSCAR) 5 MG tablet Take 5 mg by mouth daily.    Yes [provider]  furosemide (LASIX) 20 MG tablet Take 10 mg by mouth daily.  06/24/19  Yes [provider]  hydrALAZINE (APRESOLINE) 25 MG tablet Take 75 mg by mouth 3 (three) times daily.  05/27/19  Yes [provider]  labetalol (NORMODYNE) 100 MG tablet Take 300 mg by mouth 2 (two) times daily.    Yes [provider]  Multiple Vitamins-Minerals (PRESERVISION AREDS PO) Take 1 tablet by mouth 2 (two) times daily.    Yes [provider]  Netarsudil-Latanoprost (ROCKLATAN) 0.02-0.005 % SOLN Place 1 drop into the left eye at bedtime.    Yes [provider]  omeprazole (PRILOSEC OTC) 20 MG tablet Take 20 mg by mouth daily.  05/13/19  Yes [provider]  ULORIC 40 MG tablet Take 40 mg by mouth See admin instructions. on Tuesday, Friday, and Sunday   Yes [provider]    Inpatient medications: . vitamin C  500 mg Oral Daily  . aspirin  81 mg Oral Daily  . atorvastatin  20 mg Oral Daily  . timolol  1 drop Left Eye BID   And  . brimonidine  1 drop Left Eye BID  . cholecalciferol  2,000 Units Oral Daily  . dorzolamide-timolol  1 drop Left Eye BID  . febuxostat  40 mg Oral Once per day on Sun Tue Fri  . finasteride  5 mg Oral Daily  . hydrALAZINE  50 mg Oral Q8H  . insulin aspart  0-5 Units Subcutaneous QHS  . insulin aspart  0-9 Units Subcutaneous TID WC  . latanoprost  1 drop Both Eyes QHS  . metoprolol tartrate  12.5 mg Oral BID  . multivitamin  1 tablet Oral BID  . sodium bicarbonate  1,300 mg Oral BID  . triamcinolone cream   Topical BID    Discontinued Meds:   Medications Discontinued During This Encounter  Medication Reason  . nystatin-triamcinolone (MYCOLOG II) cream 1 application Patient has not taken in last 30 days  . cloNIDine  (CATAPRES) tablet 0.1 mg Patient has not taken in last 30 days  . amLODipine (NORVASC) tablet 5 mg Patient has not taken in last 30 days  . amLODipine (NORVASC) tablet 5 mg Patient has not taken in last 30 days  . clobetasol cream (TEMOVATE) 3.24 % 1 application Patient has not taken in last 30 days  . furosemide (LASIX) tablet 20 mg Patient has not taken in last 30 days  . vitamin C (ASCORBIC ACID) 500 MG tablet Patient Preference  . Sodium Bicarbonate POWD Patient Preference  . SODIUM BICARBONATE PO Patient Preference  . nystatin-triamcinolone (MYCOLOG II) cream No longer needed (for PRN medications)  .  latanoprost (XALATAN) 0.005 % ophthalmic solution Discontinued by provider  . furosemide (LASIX) 20 MG tablet Change in therapy  . DORZOLAMIDE HCL-TIMOLOL MAL OP Change in therapy  . Cholecalciferol 25 MCG (1000 UT) tablet Patient Preference  . Vitamin D, Ergocalciferol, (DRISDOL) 1.25 MG (50000 UT) CAPS capsule Discontinued by provider  . cloNIDine (CATAPRES) 0.1 MG tablet Discontinued by provider  . aspirin EC 325 MG EC tablet Change in therapy  . amLODipine (NORVASC) 5 MG tablet Discontinued by provider  . amLODipine (NORVASC) 5 MG tablet Discontinued by provider  . brimonidine-timolol (COMBIGAN) 0.2-0.5 % ophthalmic solution 1 drop   . cefTRIAXone (ROCEPHIN) 2 g in sodium chloride 0.9 % 100 mL IVPB Dose change  . azithromycin (ZITHROMAX) 500 mg in sodium chloride 0.9 % 250 mL IVPB   . Netarsudil-Latanoprost 0.02-0.005 % SOLN 1 drop Patient Preference  . hydrALAZINE (APRESOLINE) tablet 75 mg   . enoxaparin (LOVENOX) injection 30 mg   . cefTRIAXone (ROCEPHIN) 1 g in sodium chloride 0.9 % 100 mL IVPB   . furosemide (LASIX) tablet 10 mg   . diltiazem (CARDIZEM) 125 mg in dextrose 5% 125 mL (1 mg/mL) infusion   . pantoprazole (PROTONIX) EC tablet 40 mg   . hydrALAZINE (APRESOLINE) tablet 75 mg   . 0.9 %  sodium chloride infusion   . labetalol (NORMODYNE) tablet 100 mg     Social  History:  reports that he has quit smoking. His smoking use included cigarettes. He has a 10.00 pack-year smoking history. He has never used smokeless tobacco. He reports current alcohol use of about 14.0 standard drinks of alcohol per week. He reports that he does not use drugs.  Family History:   Family History  Problem Relation Age of Onset  . Diabetes Brother   . Heart attack Father   . Early death Father   . Hearing loss Father   . Heart disease Father   . Alcohol abuse Sister   . Cancer Sister     Pertinent items are noted in HPI.   Weight change: 0.225 kg  Intake/Output Summary (Last 24 hours) at 08/04/2019 1541 Last data filed at 08/04/2019 0630 Gross per 24 hour  Intake 2226.05 ml  Output 175 ml  Net 2051.05 ml   BP (!) 157/81 (BP Location: Right Arm)   Pulse 97   Temp 97.9 F (36.6 C) (Oral)   Resp 16   Ht 5\' 4"  (1.626 m)   Wt 72.8 kg   SpO2 97%   BMI 27.55 kg/m  Vitals:   08/04/19 0053 08/04/19 0134 08/04/19 0246 08/04/19 0720  BP: 112/78 (!) 100/49 123/68 (!) 157/81  Pulse:    97  Resp:    16  Temp:    97.9 F (36.6 C)  TempSrc:    Oral  SpO2: 93% 93% 94% 97%  Weight:      Height:         Physical Exam: General: no apparent distress, nontoxic appearing Cardiac: irregularly irregular, rate in 80s, grade 2/6 systolic murmur appreciated, clear S2 Respiratory: coarse breath sounds, moving air well, no crackles appreciated to auscultation Abdomen: normal bowel sounds appreciated, nontender to palpation Extremities: bilaterally 2+ pitting edema to bilateral thighs, 1+ nonpitting edema to bilateral lower extremities, no gross deformity or cyanosis, right ankle with + tenderness to joint movement   Labs: Basic Metabolic Panel: Recent Labs  Lab 08/02/19 1623 08/03/19 0326 08/04/19 0342  NA 138 137 140  K 4.5 5.5* 4.3  CL 106 105  108  CO2 21* 21* 24  GLUCOSE 121* 112* 122*  BUN 32* 33* 33*  CREATININE 2.45* 2.60* 2.59*  ALBUMIN 3.8 3.4*  --    CALCIUM 8.9 8.6* 8.3*   Liver Function Tests: Recent Labs  Lab 08/02/19 1623 08/03/19 0326  AST 14* 20  ALT 12 10  ALKPHOS 57 50  BILITOT 0.7 1.5*  PROT 6.4* 5.8*  ALBUMIN 3.8 3.4*   Recent Labs  Lab 08/02/19 1623  LIPASE 36   No results for input(s): AMMONIA in the last 168 hours. CBC: Recent Labs  Lab 08/02/19 1623 08/03/19 0448  WBC 11.7* 10.2  NEUTROABS  --  6.5  HGB 10.6* 10.1*  HCT 33.1* 32.5*  MCV 94.8 96.4  PLT 191 172   PT/INR: @LABRCNTIP (inr:5) Cardiac Enzymes: )No results for input(s): CKTOTAL, CKMB, CKMBINDEX, TROPONINI in the last 168 hours. CBG: Recent Labs  Lab 08/03/19 1625 08/03/19 1712 08/03/19 2120 08/04/19 0719 08/04/19 1125  GLUCAP 107* 101* 153* 113* 128*    Iron Studies: No results for input(s): IRON, TIBC, TRANSFERRIN, FERRITIN in the last 168 hours.  Xrays/Other Studies: CT Chest Wo Contrast  Result Date: 08/02/2019 CLINICAL DATA:  86 year old male with chest pain and back pain. EXAM: CT CHEST WITHOUT CONTRAST TECHNIQUE: Multidetector CT imaging of the chest was performed following the standard protocol without IV contrast. COMPARISON:  Chest radiograph dated 08/02/2019. FINDINGS: Evaluation of this exam is limited in the absence of intravenous contrast. Cardiovascular: Borderline cardiomegaly. Advanced 3 vessel coronary vascular calcification. There is trace pericardial effusion measuring up to 7 mm in thickness anterior to the heart. There is atherosclerotic calcification of the aortic valve annulus. Moderate atherosclerotic calcification of the aorta. The central pulmonary arteries are grossly unremarkable. Mediastinum/Nodes: No hilar or mediastinal adenopathy. There is a small hiatal hernia. The esophagus and the thyroid gland are grossly unremarkable. No mediastinal fluid collection. Lungs/Pleura: Small bilateral pleural effusions, right greater left. There are bibasilar linear and streaky densities which may represent atelectasis or  infiltrate. Additionally scattered areas of ground-glass nodularity noted throughout the lungs also concerning for an infectious process. Clinical correlation and follow-up to resolution recommended. There is no pneumothorax. The central airways are patent. Upper Abdomen: No acute abnormality. Musculoskeletal: Degenerative changes of the spine. No acute osseous pathology. IMPRESSION: 1. Small bilateral pleural effusions with findings concerning for multifocal pneumonia. Clinical correlation and follow-up recommended. 2. Advanced 3 vessel coronary vascular calcification and Aortic Atherosclerosis (ICD10-I70.0). Electronically Signed   By: Anner Crete M.D.   On: 08/02/2019 20:13   DG Chest Port 1 View  Result Date: 08/02/2019 CLINICAL DATA:  Abdominal, back and shoulder pain. Shortness of breath. EXAM: PORTABLE CHEST 1 VIEW COMPARISON:  06/28/2016 FINDINGS: Lungs are adequately inflated with subtle hazy infrahilar opacification right worse than left which may be due to vascular congestion less likely infection. Borderline cardiomegaly. No effusion. Remainder the exam is unchanged. IMPRESSION: 1. Mild hazy infrahilar opacification right worse than left likely due to vascular congestion although infection is possible. 2.  Borderline cardiomegaly. Electronically Signed   By: Marin Olp M.D.   On: 08/02/2019 15:51    Assessment/Plan: Multifocal pneumonia: Patient with multifocal pneumonia as seen on CT chest on admission.  Patient started on CTX 5/2 and azithromycin 5/3. -Continue on IV azithromycin (5/3-) 500 mg every 24 hours -Continue IV CTX (5/2-) 2 g every 24 hours  AKI on Chronic kidney disease IIIb: Creatinine 1 year prior was 1.7 with GFR 36, on admission patient creatinine 2.45 patient  increased to 2.59 yesterday 5/3.  BUN this admission 33, was 30 one year prior.  Patient with history of prediabetes HbA1c 5.7%. -Unsure of exact etiology -Continue to monitor -Strict I&Os - collect urine for  URINE STUDIES and if has active urine sediment will start acute GN workup.  Likely has had progressive CKD due to hypertensive nephrosclerosis.  -Holding IV fluids for now due to increased lower extremity swelling and elevated BNP -will add SPEP/UPEP given history of MGUS. -he has never seen a nephrologist before and will require outpatient follow up after discharge.  -no urgent indication for dialysis at this time as his Scr is not much off his baseline.   HTN: Blood pressure on admission 162/65, most recently 157/76mmHg. Blood pressure treated at home with amlodipine 5 mg daily, clonidine 0.1 mg twice daily, furosemide 20 mg daily, hydralazine 25 mg 3 times daily, labetalol 100 mg twice daily.   -Currently treated with 50 mg hydralazine 3 times daily -Continue Metoprolol tartrate 12.5 mg twice daily and increase as tolerated/needed.  Lower extremity swelling: Patient has history of lower extremity swelling for which he is prescribed Lasix 20 mg daily.  BNP on admission 608.2, today is 1116.7.  Since admission patient has received IV fluids. Was given NS 38mL/hr x 18 hours (1.8 L total), then NS 178mL/hr x 1 hour. Patient with total of 2.2L Intake since admission (PO, IV), but only 175cc urine output recorded thus far. -Strict I&Os -Consider bladder scan with In/Out cath if patient has urinary retention -Hold Lasix in setting of AKI -Follow up Echo  -check urine for proteinuria as well  A. fib with RVR: Patient with new onset A. fib with RVR, and was controlled with diltiazem drip and heparin 11 mL/hour 5/3. -Metoprolol tartrate 12.5 mg twice daily -Patient had echo 05/04 - follow up results -Transition from Heparin to Eliquis  Ankle pain: new onset, no history of fall or trauma, no history of such -Follow up Uric Acid level -PT/OT -Continue to monitor    Daisy Floro 08/04/2019, 3:41 PM    I have seen and examined this patient and agree with plan and assessment in the above note  with renal recommendations/intervention highlighted.  Pt with AKI/CKD stage IIIb of unclear etiology other than acute illness for the past week.  Will order renal US to evaluate kidney number and size as well as rule out obstruction.  Awaiting urine studies (his daughter dumped his urinal in the commode) and if has any proteinuria/hematuria, will start GN workup.  Check FeNa as well.  Governor Rooks Layni Kreamer,MD 08/04/2019 7:28 PM

## 2019-08-04 NOTE — Progress Notes (Signed)
PROGRESS NOTE    Patient: Adam [name]                            PCP: Javier Glazier, MD                    DOB: 03-06-1931            DOA: 08/02/2019 [id number]             DOS: 08/04/2019, 9:21 AM   LOS: 2 days   Date of Service: The patient was seen and examined on 08/04/2019  Subjective:   The patient had uneventful night overnight heart rate was as high as 140s, developed new onset of A. fib with RVR. Patient was evaluated by overnight physician started on IV heparin and Cardizem gtt.   The patient was seen and examined this morning.  Comfortable still in A. Fib, on heparin drip and Cardizem drip. Denies any chest pain or shortness of breath  -Daughter present at bed side.... Was updated overnight events.  Discussed the pros and cons of anticoagulation, rate control medications. The patient and daughter expressed understanding.   Brief Narrative:   Adam [name] is a 86 y.o. male with medical history significant of BPH, diabetes, hyperlipidemia, who is a resident of independent living that was brought in secondary to back pain some abdominal pain and shortness of breath. SARS-CoV-2 negative, patient confirmed status post 2 dose of Covid vaccine. On presentation was satting 89% on room air, with 2 L, improved to 91%. CT angiogram of the chest showed small bilateral pleural effusion with findings concerning for multifocal pneumonia.    08/04/2019: Overnight patient developed new onset A. fib with RVR, started on Cardizem drip and heparin drip... Cardiology consulted, echocardiogram ordered. Discussed with daughter and patient regarding chronic anticoagulation, rate control medications.  Assessment & Plan:   Principal Problem:   CAP (community acquired pneumonia) Active Problems:   HTN (hypertension)   HLD (hyperlipidemia)   Type 2 diabetes mellitus (HCC)   BPH (benign prostatic hyperplasia)   Leucocytosis   ARF (acute renal failure) (HCC)   Hypoxia   Atrial  fibrillation, new onset (HCC)   Atrial fibrillation with RVR (HCC)   community-acquired pneumonia/mild respiratory distress/hypoxemia -Associated with mild hypoxemia >> remains on 2 L of oxygen, satting 96% -Stable, afebrile normotensive -CT angiogram of the chest showed small bilateral pleural effusion with findings concerning for multifocal pneumonia.   -Patient will be continue to monitor on monitored bed -We will follow the blood and sputum culture >>  -Patient has been initiated on IV antibiotics Rocephin azithromycin will be continued -DuoNeb bronchodilator as needed    New onset A. fib with RVR -Overnight patient developed new onset of atrial fibrillation, with rapid ventricular response. -Did not have any chest pain or shortness of breath -Patient has been started on Cardizem drip to titrate for heart rate less than 100 -Patient has been initiated on heparin drip -2D echocardiogram ordered -Cardiology consulted  -Discussed with the patient and daughter regarding rate control medication chronic anticoagulation --- prefer newer agents, might be limited due to AKI versus CKD  Acute kidney injury:  -Likely due to dehydration, -The patient and daughter confirms that PCP recently started the patient on Lasix due to lower extremity edema -Lasix on hold -Gentle IV fluid hydration -We will monitor kidney function closely -Creatinine 2.4 >>2.60 >> 2.59  Mildly elevated troponin -No acute EKG changes on  admission, overnight went to A. fib -Echo ordered >> pending -Denies any chest pain   Diabetes mellitus type 2 -Not on any medications at home -Diet controlled -Diabetic diet -Checking CBG QA CHS, with SSI coverage -CBGs running low  Hypertension -Currently stable, resuming home medication of hydralazine, labetalol -According to daughter he was on labetalol 300 twice daily, currently at 100 twice daily -We will monitor closely -We will holding  Lasix  Hyperlipidemia Continue statins.  BPH -Continue Proscar    Nutritional status:         Cultures; Blood Cultures x 2 >> NGT Urine Culture  >>> NGT  Sputum Culture >> NGT   Antimicrobials: 08/02/2019 -IV antibiotics of Rocephin/azithromycin >>   Consultants: Cardiology   -----------------------------------------------------------------------------------------------------------------  DVT prophylaxis: SCD/Compression stockings and Lovenox SQ Code Status: DNR-confirmed by daughter at bedside. Family Communication: Daughter present at bedside.  The above findings and plan of care has been discussed with patient (and his daughter)  in detail,  they expressed understanding and agreement of above. -CODE STATUS and extensive medical intervention was discussed with the patient and her daughter at bedside.  They both confirm DNR status.   Admission status:   Status is: Inpatient  Remains inpatient appropriate because:Inpatient level of care appropriate due to severity of illness   Dispo: The patient is from: [facility]              Anticipated d/c is to: ALF              Anticipated d/c date is: 2 days              Patient currently is not medically stable to d/c.       Procedures:   No admission procedures for hospital encounter.     Antimicrobials:  Anti-infectives (From admission, onward)   Start     Dose/Rate Route Frequency Ordered Stop   08/04/19 0000  azithromycin (ZITHROMAX) 500 mg in sodium chloride 0.9 % 250 mL IVPB     500 mg 250 mL/hr over 60 Minutes Intravenous Every 24 hours 08/03/19 0325     08/03/19 2200  cefTRIAXone (ROCEPHIN) 1 g in sodium chloride 0.9 % 100 mL IVPB  Status:  Discontinued     1 g 200 mL/hr over 30 Minutes Intravenous Every 24 hours 08/03/19 0325 08/03/19 2118   08/03/19 2200  cefTRIAXone (ROCEPHIN) 2 g in sodium chloride 0.9 % 100 mL IVPB     2 g 200 mL/hr over 30 Minutes Intravenous Every 24 hours 08/03/19  2118     08/02/19 2030  cefTRIAXone (ROCEPHIN) 2 g in sodium chloride 0.9 % 100 mL IVPB  Status:  Discontinued     2 g 200 mL/hr over 30 Minutes Intravenous Every 24 hours 08/02/19 2019 08/03/19 0349   08/02/19 2030  azithromycin (ZITHROMAX) 500 mg in sodium chloride 0.9 % 250 mL IVPB  Status:  Discontinued     500 mg 250 mL/hr over 60 Minutes Intravenous Every 24 hours 08/02/19 2019 08/03/19 0350       Medication:  . vitamin C  500 mg Oral Daily  . aspirin  81 mg Oral Daily  . atorvastatin  20 mg Oral Daily  . timolol  1 drop Left Eye BID   And  . brimonidine  1 drop Left Eye BID  . cholecalciferol  2,000 Units Oral Daily  . dorzolamide-timolol  1 drop Left Eye BID  . febuxostat  40 mg Oral Once per day on  Sun Tue Fri  . finasteride  5 mg Oral Daily  . hydrALAZINE  75 mg Oral TID  . insulin aspart  0-5 Units Subcutaneous QHS  . insulin aspart  0-9 Units Subcutaneous TID WC  . labetalol  100 mg Oral BID  . latanoprost  1 drop Both Eyes QHS  . multivitamin  1 tablet Oral BID  . pantoprazole  40 mg Oral Daily  . sodium bicarbonate  1,300 mg Oral BID  . triamcinolone cream   Topical BID    metoprolol tartrate   Objective:   Vitals:   08/04/19 0053 08/04/19 0134 08/04/19 0246 08/04/19 0720  BP: 112/78 (!) 100/49 123/68 (!) 157/81  Pulse:    97  Resp:    16  Temp:    97.9 F (36.6 C)  TempSrc:    Oral  SpO2: 93% 93% 94% 97%  Weight:      Height:        Intake/Output Summary (Last 24 hours) at 08/04/2019 1610 Last data filed at 08/04/2019 0630 Gross per 24 hour  Intake 2226.05 ml  Output 175 ml  Net 2051.05 ml   Filed Weights   08/03/19 0315 08/03/19 1048  Weight: 72.6 kg 72.8 kg     Examination:     Physical Exam  Constitution:  Alert, cooperative, no distress,  Psychiatric: Normal and stable mood and affect, cognition intact,   HEENT: Normocephalic, PERRL, otherwise with in Normal limits  Chest:Chest symmetric Cardio vascular:  S1/S2, irregularly  irregular, significant systolic murmur pulmonary: Clear to auscultation bilaterally, respirations unlabored, negative wheezes / crackles Abdomen: Soft, non-tender, non-distended, bowel sounds,no masses, no organomegaly Muscular skeletal: Limited exam - in bed, able to move all 4 extremities, Normal strength,  Neuro: CNII-XII intact. , normal motor and sensation, reflexes intact  Extremities: +1 pitting edema lower extremities, +2 pulses  Skin: Dry, warm to touch, negative for any Rashes, No open wounds Wounds: per nursing documentation      ------------------------------------------------------------------------------------------------------------------------------------------    LABs:  CBC Latest Ref Rng & Units 08/03/2019 08/02/2019 04/30/2018  WBC 4.0 - 10.5 K/uL 10.2 11.7(H) 6.9  Hemoglobin 13.0 - 17.0 g/dL 10.1(L) 10.6(L) 10.7(L)  Hematocrit 39.0 - 52.0 % 32.5(L) 33.1(L) 33.0(L)  Platelets 150 - 400 K/uL 172 191 156   CMP Latest Ref Rng & Units 08/04/2019 08/03/2019 08/02/2019  Glucose 70 - 99 mg/dL 122(H) 112(H) 121(H)  BUN 8 - 23 mg/dL 33(H) 33(H) 32(H)  Creatinine 0.61 - 1.24 mg/dL 2.59(H) 2.60(H) 2.45(H)  Sodium 135 - 145 mmol/L 140 137 138  Potassium 3.5 - 5.1 mmol/L 4.3 5.5(H) 4.5  Chloride 98 - 111 mmol/L 108 105 106  CO2 22 - 32 mmol/L 24 21(L) 21(L)  Calcium 8.9 - 10.3 mg/dL 8.3(L) 8.6(L) 8.9  Total Protein 6.5 - 8.1 g/dL - 5.8(L) 6.4(L)  Total Bilirubin 0.3 - 1.2 mg/dL - 1.5(H) 0.7  Alkaline Phos 38 - 126 U/L - 50 57  AST 15 - 41 U/L - 20 14(L)  ALT 0 - 44 U/L - 10 12       Micro Results Recent Results (from the past 240 hour(s))  Respiratory Panel by RT PCR (Flu A&B, Covid) - Nasopharyngeal Swab     Status: None   Collection Time: 08/02/19  6:18 PM   Specimen: Nasopharyngeal Swab  Result Value Ref Range Status   SARS Coronavirus 2 by RT PCR NEGATIVE NEGATIVE Final    Comment: (NOTE) SARS-CoV-2 target nucleic acids are NOT DETECTED. The SARS-CoV-2 RNA is  generally detectable in upper respiratoy specimens during the acute phase of infection. The lowest concentration of SARS-CoV-2 viral copies this assay can detect is 131 copies/mL. A negative result does not preclude SARS-Cov-2 infection and should not be used as the sole basis for treatment or other patient management decisions. A negative result may occur with  improper specimen collection/handling, submission of specimen other than nasopharyngeal swab, presence of viral mutation(s) within the areas targeted by this assay, and inadequate number of viral copies (<131 copies/mL). A negative result must be combined with clinical observations, patient history, and epidemiological information. The expected result is Negative. Fact Sheet for Patients:  PinkCheek.be Fact Sheet for Healthcare Providers:  GravelBags.it This test is not yet ap proved or cleared by the [place] FDA and  has been authorized for detection and/or diagnosis of SARS-CoV-2 by FDA under an Emergency Use Authorization (EUA). This EUA will remain  in effect (meaning this test can be used) for the duration of the COVID-19 declaration under Section 564(b)(1) of the Act, 21 U.S.C. section 360bbb-3(b)(1), unless the authorization is terminated or revoked sooner.    Influenza A by PCR NEGATIVE NEGATIVE Final   Influenza B by PCR NEGATIVE NEGATIVE Final    Comment: (NOTE) The Xpert Xpress SARS-CoV-2/FLU/RSV assay is intended as an aid in  the diagnosis of influenza from Nasopharyngeal swab specimens and  should not be used as a sole basis for treatment. Nasal washings and  aspirates are unacceptable for Xpert Xpress SARS-CoV-2/FLU/RSV  testing. Fact Sheet for Patients: PinkCheek.be Fact Sheet for Healthcare Providers: GravelBags.it This test is not yet approved or cleared by the [place] FDA and   has been authorized for detection and/or diagnosis of SARS-CoV-2 by  FDA under an Emergency Use Authorization (EUA). This EUA will remain  in effect (meaning this test can be used) for the duration of the  Covid-19 declaration under Section 564(b)(1) of the Act, 21  U.S.C. section 360bbb-3(b)(1), unless the authorization is  terminated or revoked. Performed at [facility], [place] [address], [place], [place] [address]   Culture, blood (routine x 2) Call MD if unable to obtain prior to antibiotics being given     Status: None (Preliminary result)   Collection Time: 08/03/19  4:20 AM   Specimen: BLOOD  Result Value Ref Range Status   Specimen Description BLOOD RIGHT WRIST  Final   Special Requests   Final    BOTTLES DRAWN AEROBIC AND ANAEROBIC Blood Culture results may not be optimal due to an inadequate volume of blood received in culture bottles   Culture   Final    NO GROWTH 1 DAY Performed at [facility], [place] [address] [place], [place] [address]    Report Status PENDING  Incomplete  Culture, blood (Routine X 2) w Reflex to ID Panel     Status: None (Preliminary result)   Collection Time: 08/03/19 10:43 PM   Specimen: BLOOD RIGHT ARM  Result Value Ref Range Status   Specimen Description BLOOD RIGHT ARM  Final   Special Requests   Final    BOTTLES DRAWN AEROBIC AND ANAEROBIC Blood Culture adequate volume   Culture   Final    NO GROWTH < 12 HOURS Performed at [facility], [place] [address], [place], [place] [address]    Report Status PENDING  Incomplete    Radiology Reports CT Chest Wo Contrast  Result Date: 08/02/2019 CLINICAL DATA:  86 year old male with chest pain and back  pain. EXAM: CT CHEST WITHOUT CONTRAST TECHNIQUE: Multidetector CT imaging of the chest was performed following the standard protocol without IV contrast. COMPARISON:  Chest radiograph dated 08/02/2019. FINDINGS: Evaluation of this exam is limited in the absence of intravenous contrast.  Cardiovascular: Borderline cardiomegaly. Advanced 3 vessel coronary vascular calcification. There is trace pericardial effusion measuring up to 7 mm in thickness anterior to the heart. There is atherosclerotic calcification of the aortic valve annulus. Moderate atherosclerotic calcification of the aorta. The central pulmonary arteries are grossly unremarkable. Mediastinum/Nodes: No hilar or mediastinal adenopathy. There is a small hiatal hernia. The esophagus and the thyroid gland are grossly unremarkable. No mediastinal fluid collection. Lungs/Pleura: Small bilateral pleural effusions, right greater left. There are bibasilar linear and streaky densities which may represent atelectasis or infiltrate. Additionally scattered areas of ground-glass nodularity noted throughout the lungs also concerning for an infectious process. Clinical correlation and follow-up to resolution recommended. There is no pneumothorax. The central airways are patent. Upper Abdomen: No acute abnormality. Musculoskeletal: Degenerative changes of the spine. No acute osseous pathology. IMPRESSION: 1. Small bilateral pleural effusions with findings concerning for multifocal pneumonia. Clinical correlation and follow-up recommended. 2. Advanced 3 vessel coronary vascular calcification and Aortic Atherosclerosis (ICD10-I70.0). Electronically Signed   By: Anner Crete M.D.   On: 08/02/2019 20:13   DG Chest Port 1 View  Result Date: 08/02/2019 CLINICAL DATA:  Abdominal, back and shoulder pain. Shortness of breath. EXAM: PORTABLE CHEST 1 VIEW COMPARISON:  06/28/2016 FINDINGS: Lungs are adequately inflated with subtle hazy infrahilar opacification right worse than left which may be due to vascular congestion less likely infection. Borderline cardiomegaly. No effusion. Remainder the exam is unchanged. IMPRESSION: 1. Mild hazy infrahilar opacification right worse than left likely due to vascular congestion although infection is possible. 2.   Borderline cardiomegaly. Electronically Signed   By: Marin Olp M.D.   On: 08/02/2019 15:51   Intravitreal Injection, Pharmacologic Agent - OD - Right Eye  Result Date: 07/23/2019 Time Out 07/23/2019. 10:52 AM. Confirmed correct patient, procedure, site, and patient consented. Anesthesia Topical anesthesia was used. Anesthetic medications included Akten 3.5%. Procedure Preparation included 10% betadine to eyelids. A 30 gauge needle was used. Injection: 1.25 mg Bevacizumab (AVASTIN) SOLN   NDC: [address]-6   Route: Intravitreal, Site: Right Eye, Waste: 0 mg Post-op Post injection exam found visual acuity of at least counting fingers. The patient tolerated the procedure well. There were no complications. The patient received written and verbal post procedure care education. Post injection medications were not given.   Intravitreal Injection, Pharmacologic Agent - OS - Left Eye  Result Date: 07/09/2019 Time Out 07/09/2019. 11:09 AM. Confirmed correct patient, procedure, site, and patient consented. Anesthesia Topical anesthesia was used. Anesthetic medications included Akten 3.5%. Procedure Preparation included 5% betadine to ocular surface, 10% betadine to eyelids, Tobramycin 0.3%. A 30 gauge needle was used. Injection: 2 mg aflibercept Alfonse Flavors) SOLN   NDC: A3590391, Lot: [id number]   Route: Intravitreal, Site: Left Eye, Waste: 0 mg Post-op Post injection exam found visual acuity of at least counting fingers. The patient tolerated the procedure well. There were no complications. The patient received written and verbal post procedure care education. Post injection medications were not given.   OCT, Retina - OU - Both Eyes  Result Date: 07/23/2019 Right Eye Quality was good. Scan locations included subfoveal. Central Foveal Thickness: 349. Progression has improved. Findings include retinal drusen , abnormal foveal contour, subretinal scarring, cystoid macular edema, macular pucker, outer retinal atrophy,  central retinal atrophy, choroidal neovascular membrane. Left Eye Quality was good. Scan locations included subfoveal. Central Foveal Thickness: 265. Progression has improved. Findings include abnormal foveal contour, outer retinal atrophy, central retinal atrophy, retinal drusen , choroidal neovascular membrane. Notes OD, stable overall.  Some foveal atrophy some perifoveal CME is chronic, will repeat Avastin OD today   SIGNED: Deatra James, MD, FACP, FHM. [facility],  Pager (please use [email or web address] to page/text)  If 7PM-7AM, please contact night-coverage [email or web address] 08/04/2019, 9:21 AM

## 2019-08-04 NOTE — Progress Notes (Signed)
ANTICOAGULATION CONSULT NOTE - Follow Up Consult  Pharmacy Consult for IV Heparin Indication: atrial fibrillation (New onset)  No Known Allergies  Patient Measurements: Height: 5\' 4"  (162.6 cm) Weight: 72.8 kg (160 lb 7.9 oz) IBW/kg (Calculated) : 59.2 Heparin Dosing Weight: 72.8 kg  Vital Signs: Temp: 97.9 F (36.6 C) (05/04 0720) Temp Source: Oral (05/04 0720) BP: 157/81 (05/04 0720) Pulse Rate: 97 (05/04 0720)  Labs: Recent Labs    08/02/19 1623 08/02/19 1841 08/03/19 0326 08/03/19 0448 08/04/19 0342 08/04/19 0933  HGB 10.6*  --   --  10.1*  --   --   HCT 33.1*  --   --  32.5*  --   --   PLT 191  --   --  172  --   --   HEPARINUNFRC  --   --   --   --  0.34 0.21*  CREATININE 2.45*  --  2.60*  --  2.59*  --   TROPONINIHS 20* 18*  --   --   --   --     Estimated Creatinine Clearance: 18.4 mL/min (A) (by C-G formula based on SCr of 2.59 mg/dL (H)).   Medications:  Infusions:  . azithromycin 500 mg (08/04/19 0024)  . cefTRIAXone (ROCEPHIN)  IV 2 g (08/03/19 2208)  . heparin 900 Units/hr (08/03/19 2206)    Assessment: 86 year old on IV heparin for new onset atrial fibrillation.  Initial heparin level was therapeutic on 900 units/hr.  Repeat heparin level has decreased to 0.21.  CBC is stable. No bleeding reported.   Goal of Therapy:  Heparin level 0.3-0.7 units/ml Monitor platelets by anticoagulation protocol: Yes   Plan:  Increase Heparin drip to 1100 units/hr.  Recheck Heparin level in 6 hours.  Daily heparin level and CBC while on therapy.   Sloan Leiter, PharmD, BCPS, BCCCP Clinical Pharmacist Please refer to [facility] for [place] numbers 08/04/2019,11:51 AM

## 2019-08-04 NOTE — Consult Note (Addendum)
Cardiology Consultation:   Patient ID: Adam [name] MRN: [id number]; DOB: 1930/12/24  Admit date: 08/02/2019 Date of Consult: 08/04/2019  Primary Care Provider: Javier Glazier, MD Primary Cardiologist: Dorris Carnes, MD Juliann Pulse at [place]  Patient Profile:   Adam [name] is a 86 y.o. male with a hx of hypertension, diet-controlled diabetes mellitus, GERD BPH, chronic kidney disease IV and gait imbalance who is being seen today for the evaluation of afib RVR  at the request of Dr. Roger Shelter.   Establish care with Dr. [name] in 2017 for dizziness and found to have orthostatic hypotension.  Admitted January 2020 with altered mental status and hypoxia in setting of hypertensive urgency. MRI/MRA of the brain showed no acute signs of a stroke, but did show some focal moderate proximal left M1 stenosis with moderate proximal right P2 stenosis.  Neurology concern for hypertensive encephalopathyandless likely TIA.    Follow-up 30-day event monitor without arrhythmia.  Patient reports history of congenital heart murmur.  Most recently seen by cardiologist at [facility] about a month ago and had echocardiogram.  He is unaware of results.  Patient reports longstanding history of intermittent lower extremity edema and taking as needed Lasix.  For the past 1 month also his edema has worsened and taking Lasix (likely 10 mg) daily.  His baseline creatinine around 2.  Never seen by nephrology.  History of Present Illness:   Adam [name] brought to [facility] 5/2 by EMS from [place] [facility] facility for back pain, abdominal pain and shoulder pain.  Today patient reports he came here because dull achy constant back pain.  Some cough.  Questionable worsening pain with deep breath.  He had his Covid vaccine.  No sick contact.  Denies chest pain, shortness of breath, dizziness, orthopnea, PND or melena.  No syncope.  BNP 608.  D-dimer 0.68.  Troponin 20>>18.  CT angio of chest showed small bilateral  pleural effusion with findings concerning for multifocal pneumonia.  Treated with IV Rocephin and bronchodilators.  Concern for dehydration and he was given gentle fluids. Scr 2.45>>2.6>>2.59  Hemoglobin A1c 5.7.  Yesterday evening patient went into atrial fibrillation with rapid ventricular rate.  He was started on IV heparin and diltiazem GTT.  Patient had multiple pauses and postconversion pauses, longest 5.3 seconds.  He was asymptomatic. He is maintaining sinus rhythm since 9:00 this morning.  Cardizem discontinued.  Pending echo Normal TSH Free T4 minimally up    Past Medical History:  Diagnosis Date  . Acute meniscal tear of knee RIGHT KNEE  . Arthritis   . BPH (benign prostatic hypertrophy)   . Colon polyps    hyperplastic  . Diabetes mellitus type 2, diet-controlled (HCC) AND EXERCISE-- LAST A1C  5.6  . Diverticulosis   . Gait abnormality 11/08/2016  . GERD (gastroesophageal reflux disease)   . Glaucoma   . H/O hiatal hernia   . Heart murmur   . History of esophageal stricture S/P DILATION IN 2007  . Hyperlipidemia   . Hypertension   . IgM monoclonal gammopathy of uncertain significance ASYMPTOMATIC    FOLLOWED BY DR Alen Blew  . Internal hemorrhoids   . Macrocytic anemia DUE TO CHRONIC RENAL INSUFF.  . Nocturia   . Right knee pain     Past Surgical History:  Procedure Laterality Date  . CATARACT EXTRACTION W/ INTRAOCULAR LENS  IMPLANT, BILATERAL  2012  . HEMORRHOID SURGERY    . HERNIA REPAIR    . KNEE ARTHROSCOPY  12/25/2011   Procedure:  ARTHROSCOPY KNEE;  Surgeon: Tobi Bastos, MD;  Location: [facility];  Service: Orthopedics;  Laterality: Right;  right knee arthroscopy with medial menisectomy       Inpatient Medications: Scheduled Meds: . vitamin C  500 mg Oral Daily  . aspirin  81 mg Oral Daily  . atorvastatin  20 mg Oral Daily  . timolol  1 drop Left Eye BID   And  . brimonidine  1 drop Left Eye BID  . cholecalciferol  2,000 Units Oral  Daily  . dorzolamide-timolol  1 drop Left Eye BID  . febuxostat  40 mg Oral Once per day on Sun Tue Fri  . finasteride  5 mg Oral Daily  . insulin aspart  0-5 Units Subcutaneous QHS  . insulin aspart  0-9 Units Subcutaneous TID WC  . labetalol  100 mg Oral BID  . latanoprost  1 drop Both Eyes QHS  . multivitamin  1 tablet Oral BID  . sodium bicarbonate  1,300 mg Oral BID  . triamcinolone cream   Topical BID   Continuous Infusions: . azithromycin 500 mg (08/04/19 0024)  . cefTRIAXone (ROCEPHIN)  IV 2 g (08/03/19 2208)  . heparin 1,100 Units/hr (08/04/19 1300)   PRN Meds: metoprolol tartrate  Allergies:   No Known Allergies  Social History:   Social History   Socioeconomic History  . Marital status: Widowed    Spouse name: Mechele Claude  . Number of children: 2  . Years of education: 38  . Highest education level: Not on file  Occupational History  . Not on file  Tobacco Use  . Smoking status: Former Smoker    Packs/day: 1.00    Years: 10.00    Pack years: 10.00    Types: Cigarettes  . Smokeless tobacco: Never Used  Substance and Sexual Activity  . Alcohol use: Yes    Alcohol/week: 14.0 standard drinks    Types: 14 Standard drinks or equivalent per week  . Drug use: No  . Sexual activity: Not on file  Other Topics Concern  . Not on file  Social History Narrative   Lives with wife, Mechele Claude   Caffeine use: Diet coke daily   Right-handed   Social Determinants of Health   Financial Resource Strain:   . Difficulty of Paying Living Expenses:   Food Insecurity:   . Worried About Charity fundraiser in the Last Year:   . Arboriculturist in the Last Year:   Transportation Needs:   . Film/video editor (Medical):   Marland Kitchen Lack of Transportation (Non-Medical):   Physical Activity:   . Days of Exercise per Week:   . Minutes of Exercise per Session:   Stress:   . Feeling of Stress :   Social Connections:   . Frequency of Communication with Friends and Family:   .  Frequency of Social Gatherings with Friends and Family:   . Attends Religious Services:   . Active Member of Clubs or Organizations:   . Attends Archivist Meetings:   Marland Kitchen Marital Status:   Intimate Partner Violence:   . Fear of Current or Ex-Partner:   . Emotionally Abused:   Marland Kitchen Physically Abused:   . Sexually Abused:     Family History:   Family History  Problem Relation Age of Onset  . Diabetes Brother   . Heart attack Father   . Early death Father   . Hearing loss Father   . Heart disease Father   .  Alcohol abuse Sister   . Cancer Sister      ROS:  Please see the history of present illness.  All other ROS reviewed and negative.     Physical Exam/Data:   Vitals:   08/04/19 0053 08/04/19 0134 08/04/19 0246 08/04/19 0720  BP: 112/78 (!) 100/49 123/68 (!) 157/81  Pulse:    97  Resp:    16  Temp:    97.9 F (36.6 C)  TempSrc:    Oral  SpO2: 93% 93% 94% 97%  Weight:      Height:        Intake/Output Summary (Last 24 hours) at 08/04/2019 1345 Last data filed at 08/04/2019 0630 Gross per 24 hour  Intake 2226.05 ml  Output 175 ml  Net 2051.05 ml   Last 3 Weights 08/03/2019 08/03/2019 05/21/2018  Weight (lbs) 160 lb 7.9 oz 160 lb 149 lb  Weight (kg) 72.8 kg 72.576 kg 67.586 kg     Body mass index is 27.55 kg/m.  General:  Well nourished, well developed, in no acute distress HEENT: normal Lymph: no adenopathy Neck: no JVD Endocrine:  No thryomegaly Vascular: No carotid bruits; FA pulses 2+ bilaterally without bruits  Cardiac:  normal S1, S2; RRR; 3/6 systolic murmur Lungs:  clear to auscultation bilaterally, no wheezing, rhonchi or rales  Abd: soft, nontender, no hepatomegaly  Ext: 1 + BL LE edema Musculoskeletal:  No deformities, BUE and BLE strength normal and equal Skin: warm and dry  Neuro:  CNs 2-12 intact, no focal abnormalities noted Psych:  Normal affect   EKG:  The EKG was personally reviewed and demonstrates:  SR at rate of 64 bpm Telemetry:   Telemetry was personally reviewed and demonstrates: Currently in sinus rhythm with overnight A. fib episode with intermittent pauses  Relevant CV Studies:  Echo 05/01/18 IMPRESSIONS    1. The left ventricle has normal systolic function of 68-34%. The cavity  size is severely increased. There is no left ventricular wall thickness.  Echo evidence of pseudonormal diastolic filling patterns. Elevated left  ventricular end-diastolic pressure.  2. Grade 2 diastolic dysfunction.  3. Severely dilated left atrial size.  4. Normal right atrial size.  5. Normal tricuspid valve.  6. No atrial level shunt detected by color flow Doppler.   FINDINGS  Left Ventricle: No evidence of left ventricular regional wall motion  abnormalities. The left ventricle has normal systolic function of 19-62%.  The cavity size is severely increased. There is no left ventricular wall  thickness. Echo evidence of  pseudonormal diastolic filling patterns. Elevated left ventricular  end-diastolic pressure. Grade 2 diastolic dysfunction.  Right Ventricle: The right ventricle is normal in size. There is normal  hypertrophy. There is normal systolic function. Right ventricular systolic  pressure is normal with an estimated pressure of 14.9 mmHg.  Left Atrium: The left atrium is severely dilated.  Right Atrium: The right atrial size is normal in size.  Interatrial Septum: No atrial level shunt detected by color flow Doppler.   Pericardium: There is no evidence of pericardial effusion.  Mitral Valve: The mitral valve normal in structure. Regurgitation is  trivial by color flow Doppler.  Tricuspid Valve: The tricuspid valve is normal in structure. Tricuspid  regurgitation is trivial by color flow Doppler.  Aortic Valve: The aortic valve tricuspid. There is severe thickening and  moderate calcification of the aortic valve with decreased cusp excursion.  Aortic valve regurgitation was not visualized by color flow  Doppler. The  calculated aortic  valve area is  1.09 cm. Mild aortic stenosis. Peak velocity 2.69 m/s. Mean gradient 17  mmHg. Mild aortic regurgitation.  Pulmonic Valve: The pulmonic valve is normal. Pulmonic valve regurgitation  is not visualized by color flow Doppler.  Venous: The inferior vena cava was normal in size with greater than 50%  respiratory variablity.    LEFT VENTRICLE  PLAX 2D (Teich)  LV EF:     66.7 %  Diastology  LVIDd:     4.47 cm LV e' lateral:  5.98 cm/s  LVIDs:     2.83 cm LV E/e' lateral: 17.2  LV PW:     0.98 cm LV e' medial:  5.87 cm/s  LV IVS:     1.07 cm LV E/e' medial: 17.5  LVOT diam:   2.00 cm  LV SV:     61 ml  LVOT Area:   3.14 cm   RIGHT VENTRICLE  RV Basal diam: 2.82 cm  RV S prime:   14.50 cm/s  TAPSE (M-mode): 2.4 cm  RVSP:      14.9 mmHg   LEFT ATRIUM       Index    RIGHT ATRIUM      Index  LA diam:    5.00 cm 2.93 cm/m RA Pressure: 3 mmHg  LA Vol (A2C):  95.6 ml 56.02 ml/m RA Area:   17.40 cm  LA Vol (A4C):  72.2 ml 42.31 ml/m RA Volume:  48.60 ml 28.48 ml/m  LA Biplane Vol: 84.3 ml 49.40 ml/m  AORTIC VALVE  AV Area (Vmax):  1.12 cm  AV Area (Vmean):  0.94 cm  AV Area (VTI):   1.09 cm  AV Vmax:      268.67 cm/s  AV Vmean:     193.333 cm/s  AV VTI:      0.687 m  AV Peak Grad:   28  Laboratory Data:  High Sensitivity Troponin:   Recent Labs  Lab 08/02/19 1623 08/02/19 1841  TROPONINIHS 20* 18*     Chemistry Recent Labs  Lab 08/02/19 1623 08/03/19 0326 08/04/19 0342  NA 138 137 140  K 4.5 5.5* 4.3  CL 106 105 108  CO2 21* 21* 24  GLUCOSE 121* 112* 122*  BUN 32* 33* 33*  CREATININE 2.45* 2.60* 2.59*  CALCIUM 8.9 8.6* 8.3*  GFRNONAA 23* 21* 21*  GFRAA 26* 25* 25*  ANIONGAP 11 11 8     Recent Labs  Lab 08/02/19 1623 08/03/19 0326  PROT 6.4* 5.8*  ALBUMIN 3.8 3.4*  AST 14* 20  ALT 12 10  ALKPHOS 57  50  BILITOT 0.7 1.5*   Hematology Recent Labs  Lab 08/02/19 1623 08/03/19 0448  WBC 11.7* 10.2  RBC 3.49* 3.37*  HGB 10.6* 10.1*  HCT 33.1* 32.5*  MCV 94.8 96.4  MCH 30.4 30.0  MCHC 32.0 31.1  RDW 14.2 14.4  PLT 191 172   BNP Recent Labs  Lab 08/02/19 1623 08/04/19 0639  BNP 608.2* 1,116.7*    DDimer  Recent Labs  Lab 08/02/19 1841  DDIMER 0.68*     Radiology/Studies:  CT Chest Wo Contrast  Result Date: 08/02/2019 CLINICAL DATA:  86 year old male with chest pain and back pain. EXAM: CT CHEST WITHOUT CONTRAST TECHNIQUE: Multidetector CT imaging of the chest was performed following the standard protocol without IV contrast. COMPARISON:  Chest radiograph dated 08/02/2019. FINDINGS: Evaluation of this exam is limited in the absence of intravenous contrast. Cardiovascular: Borderline cardiomegaly. Advanced 3 vessel coronary vascular calcification. There  is trace pericardial effusion measuring up to 7 mm in thickness anterior to the heart. There is atherosclerotic calcification of the aortic valve annulus. Moderate atherosclerotic calcification of the aorta. The central pulmonary arteries are grossly unremarkable. Mediastinum/Nodes: No hilar or mediastinal adenopathy. There is a small hiatal hernia. The esophagus and the thyroid gland are grossly unremarkable. No mediastinal fluid collection. Lungs/Pleura: Small bilateral pleural effusions, right greater left. There are bibasilar linear and streaky densities which may represent atelectasis or infiltrate. Additionally scattered areas of ground-glass nodularity noted throughout the lungs also concerning for an infectious process. Clinical correlation and follow-up to resolution recommended. There is no pneumothorax. The central airways are patent. Upper Abdomen: No acute abnormality. Musculoskeletal: Degenerative changes of the spine. No acute osseous pathology. IMPRESSION: 1. Small bilateral pleural effusions with findings concerning for  multifocal pneumonia. Clinical correlation and follow-up recommended. 2. Advanced 3 vessel coronary vascular calcification and Aortic Atherosclerosis (ICD10-I70.0). Electronically Signed   By: Anner Crete M.D.   On: 08/02/2019 20:13   DG Chest Port 1 View  Result Date: 08/02/2019 CLINICAL DATA:  Abdominal, back and shoulder pain. Shortness of breath. EXAM: PORTABLE CHEST 1 VIEW COMPARISON:  06/28/2016 FINDINGS: Lungs are adequately inflated with subtle hazy infrahilar opacification right worse than left which may be due to vascular congestion less likely infection. Borderline cardiomegaly. No effusion. Remainder the exam is unchanged. IMPRESSION: 1. Mild hazy infrahilar opacification right worse than left likely due to vascular congestion although infection is possible. 2.  Borderline cardiomegaly. Electronically Signed   By: Marin Olp M.D.   On: 08/02/2019 15:51    Assessment and Plan:   1. New onset atrial fibrillation with rapid ventricular rate -Presented with back pain and found to have community-acquired pneumonia -Significant into atrial fibrillation with rapid ventricular rate.  He was started on IV Cardizem and IV heparin.  Overnight patient has intermittent pause and post cardioversion pause with longest two 5.3-second.  Currently maintaining sinus rhythm since 9 AM.  Cardizem discontinued. -TSH normal.  Free T4 minimally up -Pending echocardiogram -CHA2DS2-VASc score of 3 for age and HTN. 4 if DM (Hgb A1c 5.7 this admission) - Start metoprolol 12.5mg  BID>> watch for pause>> my eventually need pacemaker - Start Eliquis 2.5mg  BID (renal and age appropriate) after echo resulted.  2.  Mild aortic stenosis -Patient reports history of continue heart murmur -Echocardiogram January 2020 showed mild AS with Peak velocity 2.69 m/s. Mean gradient 17  mmHg. Mild aortic regurgitation.  -Patient states had an echocardiogram 1 month ago at [facility]>> unknown result -Pending echo  here  3.  Lower extremity edema/ Acute on chronic diastolic dysfunction  -Patient reports intermittent history of lower extremity edema and taking Lasix as needed however recently worsened and now taking daily Lasix 10mg  -Intermediate diastolic dysfunction by echocardiogram in 2020 - BNP worsen to 1116 today from 608 2 days ago, likely due to elevated heart rate + gentle fluids  4. HTN -Initially hypertensive then improved on Cardizem now gradually going up - Will review meds with MD>> likely add hydralazine for afterload reduction   5. CAP - Per primary team   6 Acute on chronic kidney disease stage IV - Scr 2.45>>2.6>>2.59 -Never seen by nephrology  7. HLD - No results found for requested labs within last 8760 hours.  - Continue statin - Will recheck labs.   For questions or updates, please contact [place] Please consult [email or web address] for contact info under     Signed, Leanor Kail, PA  08/04/2019 1:45 PM

## 2019-08-05 DIAGNOSIS — N179 Acute kidney failure, unspecified: Secondary | ICD-10-CM | POA: Diagnosis not present

## 2019-08-05 DIAGNOSIS — I495 Sick sinus syndrome: Secondary | ICD-10-CM | POA: Diagnosis not present

## 2019-08-05 DIAGNOSIS — J189 Pneumonia, unspecified organism: Secondary | ICD-10-CM | POA: Diagnosis not present

## 2019-08-05 DIAGNOSIS — I4891 Unspecified atrial fibrillation: Secondary | ICD-10-CM | POA: Diagnosis not present

## 2019-08-05 LAB — BASIC METABOLIC PANEL
Anion gap: 12 (ref 5–15)
BUN: 29 mg/dL — ABNORMAL HIGH (ref 8–23)
CO2: 21 mmol/L — ABNORMAL LOW (ref 22–32)
Calcium: 8.6 mg/dL — ABNORMAL LOW (ref 8.9–10.3)
Chloride: 107 mmol/L (ref 98–111)
Creatinine, Ser: 2.46 mg/dL — ABNORMAL HIGH (ref 0.61–1.24)
GFR calc Af Amer: 26 mL/min — ABNORMAL LOW (ref >60)
GFR calc non Af Amer: 23 mL/min — ABNORMAL LOW (ref >60)
Glucose, Bld: 117 mg/dL — ABNORMAL HIGH (ref 70–99)
Potassium: 4.6 mmol/L (ref 3.5–5.1)
Sodium: 140 mmol/L (ref 135–145)

## 2019-08-05 LAB — T3, FREE: T3, Free: 1.9 pg/mL — ABNORMAL LOW (ref 2.0–4.4)

## 2019-08-05 LAB — LIPID PANEL
Cholesterol: 106 mg/dL (ref 0–200)
HDL: 33 mg/dL — ABNORMAL LOW (ref >40)
LDL Cholesterol: 56 mg/dL (ref 0–99)
Total CHOL/HDL Ratio: 3.2 RATIO
Triglycerides: 85 mg/dL (ref <150)
VLDL: 17 mg/dL (ref 0–40)

## 2019-08-05 LAB — GLUCOSE, CAPILLARY
Glucose-Capillary: 110 mg/dL — ABNORMAL HIGH (ref 70–99)
Glucose-Capillary: 158 mg/dL — ABNORMAL HIGH (ref 70–99)
Glucose-Capillary: 109 mg/dL — ABNORMAL HIGH (ref 70–99)
Glucose-Capillary: 121 mg/dL — ABNORMAL HIGH (ref 70–99)

## 2019-08-05 LAB — BRAIN NATRIURETIC PEPTIDE: B Natriuretic Peptide: 1476 pg/mL — ABNORMAL HIGH (ref 0.0–100.0)

## 2019-08-05 LAB — VITAMIN B12: Vitamin B-12: 501 pg/mL (ref 180–914)

## 2019-08-05 LAB — HEPARIN LEVEL (UNFRACTIONATED): Heparin Unfractionated: 0.46 IU/mL (ref 0.30–0.70)

## 2019-08-05 LAB — AMMONIA: Ammonia: 17 umol/L (ref 9–35)

## 2019-08-05 LAB — RPR: RPR Ser Ql: NONREACTIVE

## 2019-08-05 LAB — MAGNESIUM: Magnesium: 2 mg/dL (ref 1.7–2.4)

## 2019-08-05 MED ORDER — DOXYCYCLINE HYCLATE 100 MG PO TABS
100.0000 mg | ORAL_TABLET | Freq: Two times a day (BID) | ORAL | Status: DC
  Administered 2019-08-05 – 2019-08-09 (×9): 100 mg via ORAL
  Filled 2019-08-05 (×9): qty 1

## 2019-08-05 MED ORDER — IPRATROPIUM-ALBUTEROL 0.5-2.5 (3) MG/3ML IN SOLN
3.0000 mL | RESPIRATORY_TRACT | Status: DC | PRN
  Administered 2019-08-05 – 2019-08-06 (×3): 3 mL via RESPIRATORY_TRACT
  Filled 2019-08-05 (×3): qty 3

## 2019-08-05 MED ORDER — CLONIDINE HCL 0.2 MG PO TABS
0.2000 mg | ORAL_TABLET | Freq: Once | ORAL | Status: AC
  Administered 2019-08-05: 0.2 mg via ORAL
  Filled 2019-08-05: qty 1

## 2019-08-05 MED ORDER — FUROSEMIDE 20 MG PO TABS
20.0000 mg | ORAL_TABLET | Freq: Every day | ORAL | Status: DC
  Administered 2019-08-05: 20 mg via ORAL
  Filled 2019-08-05: qty 1

## 2019-08-05 NOTE — Progress Notes (Signed)
Assessed patient as requested by RN.  Patient has upper airway wheeze like sounds over his throat.  Pt keeps trying to clear his throat.  Lungs sounds are clear and diminished bilaterally.  Lab results reviewed.  BNP increased.  RN will be call MD.  Saturation 94%  On 3 liters.

## 2019-08-05 NOTE — Progress Notes (Signed)
Due to his ongoing afib issue, d/w Dr. Maylene Roes and we will change his azithromycin to doxycyline.  Onnie Boer, PharmD, BCIDP, AAHIVP, CPP Infectious Disease Pharmacist 08/05/2019 11:23 AM

## 2019-08-05 NOTE — Evaluation (Signed)
Occupational Therapy Evaluation Patient Details Name: Adam [name] MRN: [id number] DOB: 05-Sep-1930 Today's Date: 08/05/2019    History of Present Illness 86 y.o. male with a hx of hypertension, diet-controlled diabetes mellitus, GERD, BPH, chronic kidney disease IV, and gait imbalance admitted to ED on 5/2 from [place] with multifocal PNA, and developed afib with RVR. Cardiology following.   Clinical Impression   Pt functions independently at baseline. He receives meals in ILF dining room and drives. Pt presents with decreased activity tolerance and impaired balance. He requires up to min assist for OOB mobility and ADL. At this point, but needs supervision in the home and daughter can only provide intermittent assist. Recommending ST rehab prior to pt returning to his apartment. Will follow acutely.    Follow Up Recommendations  SNF(pt may progress to home with [facility])    Equipment Recommendations  None recommended by OT    Recommendations for Other Services       Precautions / Restrictions Precautions Precautions: Fall Restrictions Weight Bearing Restrictions: No      Mobility Bed Mobility               General bed mobility comments: pt received in chair  Transfers Overall transfer level: Needs assistance Equipment used: Rolling walker (2 wheeled) Transfers: Sit to/from Stand Sit to Stand: Min guard         General transfer comment: min guard for safety, verbal cuing for hand placement when rising as pt not used to using RW.    Balance Overall balance assessment: Needs assistance Sitting-balance support: No upper extremity supported;Feet supported Sitting balance-Leahy Scale: Good Sitting balance - Comments: able to lean forward and adjust his socks, right self without UE support   Standing balance support: Bilateral upper extremity supported;During functional activity Standing balance-Leahy Scale: Poor Standing balance comment: unsteady, reliant on  external support                           ADL either performed or assessed with clinical judgement   ADL Overall ADL's : Needs assistance/impaired Eating/Feeding: Independent   Grooming: Min guard;Standing   Upper Body Bathing: Set up;Sitting   Lower Body Bathing: Min guard;Sit to/from stand   Upper Body Dressing : Set up;Sitting   Lower Body Dressing: Min guard;Sit to/from stand   Toilet Transfer: Minimal assistance;Ambulation;RW   Toileting- Water quality scientist and Hygiene: Min guard;Sit to/from stand       Functional mobility during ADLs: Minimal assistance;Rolling walker       Vision Baseline Vision/History: Wears glasses Wears Glasses: Reading only Patient Visual Report: No change from baseline       Perception     Praxis      Pertinent Vitals/Pain Pain Assessment: No/denies pain Pain Score: 0-No pain Pain Location: R foot no longer hurting, one day of acute pain with negative xrays and gout workup Pain Intervention(s): Monitored during session     Hand Dominance Right   Extremity/Trunk Assessment Upper Extremity Assessment Upper Extremity Assessment: Overall WFL for tasks assessed   Lower Extremity Assessment Lower Extremity Assessment: Defer to PT evaluation   Cervical / Trunk Assessment Cervical / Trunk Assessment: Normal   Communication Communication Communication: HOH   Cognition Arousal/Alertness: Awake/alert Behavior During Therapy: WFL for tasks assessed/performed Overall Cognitive Status: Within Functional Limits for tasks assessed  General Comments: Pleasant, laughing and joking with PT/Ot during session. Answers questions appropriately, lacks some insight into deficits because pt is used to being fully independent.   General Comments  SpO2 90% and greater on RA    Exercises     Shoulder Instructions      Home Living Family/patient expects to be discharged to:: Private  residence Living Arrangements: Alone Available Help at Discharge: Family;Available PRN/intermittently Type of Home: Independent living facility Home Access: Elevator     Home Layout: One level     Bathroom Shower/Tub: Occupational psychologist: Handicapped height     Home Equipment: Grab bars - tub/shower;Hand held shower head;Shower seat;Grab bars - toilet;Walker - 4 wheels          Prior Functioning/Environment Level of Independence: Independent        Comments: participates in exercise and walks to dining room, drives. Pt uses rollator to carry weights in his exercise class. Pt meets his friends at the [employer] pub every friday for "happy hour"        OT Problem List: Impaired balance (sitting and/or standing);Decreased activity tolerance      OT Treatment/Interventions: Self-care/ADL training;DME and/or AE instruction;Energy conservation;Patient/family education;Balance training;Therapeutic activities    OT Goals(Current goals can be found in the care plan section) Acute Rehab OT Goals Patient Stated Goal: go back to ILF, return to independence OT Goal Formulation: With patient Time For Goal Achievement: 08/19/19 Potential to Achieve Goals: Good ADL Goals Pt Will Perform Grooming: with modified independence;standing Pt Will Perform Lower Body Bathing: with modified independence;sit to/from stand Pt Will Perform Lower Body Dressing: with modified independence;sit to/from stand Pt Will Transfer to Toilet: with modified independence;ambulating Pt Will Perform Toileting - Clothing Manipulation and hygiene: with modified independence;sit to/from stand Additional ADL Goal #1: Pt will generalize energy conservation strategies in ADL independently.  OT Frequency: Min 2X/week   Barriers to D/C:            Co-evaluation   Reason for Co-Treatment: For patient/therapist safety PT goals addressed during session: Mobility/safety with mobility;Proper use of DME         AM-PAC OT "6 Clicks" Daily Activity     Outcome Measure Help from another person eating meals?: None Help from another person taking care of personal grooming?: A Little Help from another person toileting, which includes using toliet, bedpan, or urinal?: A Little Help from another person bathing (including washing, rinsing, drying)?: A Little Help from another person to put on and taking off regular upper body clothing?: None Help from another person to put on and taking off regular lower body clothing?: A Little 6 Click Score: 20   End of Session Equipment Utilized During Treatment: Gait belt;Rolling walker  Activity Tolerance: Patient tolerated treatment well Patient left: in chair;with call bell/phone within reach;with family/visitor present  OT Visit Diagnosis: Other abnormalities of gait and mobility (R26.89);Other (comment)(decreased activity tolerance)                Time: 0174-9449 OT Time Calculation (min): 24 min Charges:  OT General Charges $OT Visit: 1 Visit OT Evaluation $OT Eval Moderate Complexity: 1 Mod  Nestor Lewandowsky, OTR/L Acute Rehabilitation Services Pager: [phone] Office: [phone]  Malka So 08/05/2019, 12:45 PM

## 2019-08-05 NOTE — Progress Notes (Addendum)
[name] Renal Daily Progress Note  Indications for Consult: AKI  S: Doing well this morning, doing a breathing treatment this morning. States ankle pain is much better today.  O:BP (!) 148/97 (BP Location: Right Arm)   Pulse (!) 121   Temp 98.3 F (36.8 C)   Resp 16   Ht 5\' 4"  (1.626 m)   Wt 72.8 kg   SpO2 97%   BMI 27.55 kg/m   Intake/Output Summary (Last 24 hours) at 08/05/2019 0731 Last data filed at 08/05/2019 0530 Gross per 24 hour  Intake 735.14 ml  Output 400 ml  Net 335.14 ml   Intake/Output: I/O last 3 completed shifts: In: 2961.2 [P.O.:360; I.V.:1901.2; IV Piggyback:700] Out: 575 [Urine:575]  Intake/Output this shift:  No intake/output data recorded. Weight change:   Physical Exam:  Gen: sitting upright at edge of bed, getting breathing treatment, NAD, nontoxic appearing CVS: Irregularly irregular rhythm, grade 2 systolic murmur appreciated, clear S2 Resp: Bilateral lower lobe wheezes appreciated, minimal crackles, otherwise moving air well Abd: Soft, nontender, normal bowel sounds appreciated Ext: 2+ pitting edema appreciated to bilateral feet, no erythema or deformity appreciated, 2+ DP pulses bilaterally  Recent Labs  Lab 08/02/19 1623 08/03/19 0326 08/04/19 0342 08/05/19 0359  NA 138 137 140 140  K 4.5 5.5* 4.3 4.6  CL 106 105 108 107  CO2 21* 21* 24 21*  GLUCOSE 121* 112* 122* 117*  BUN 32* 33* 33* 29*  CREATININE 2.45* 2.60* 2.59* 2.46*  ALBUMIN 3.8 3.4*  --   --   CALCIUM 8.9 8.6* 8.3* 8.6*  AST 14* 20  --   --   ALT 12 10  --   --    Liver Function Tests: Recent Labs  Lab 08/02/19 1623 08/03/19 0326  AST 14* 20  ALT 12 10  ALKPHOS 57 50  BILITOT 0.7 1.5*  PROT 6.4* 5.8*  ALBUMIN 3.8 3.4*   Recent Labs  Lab 08/02/19 1623  LIPASE 36   Recent Labs  Lab 08/05/19 0359  AMMONIA 17   CBC: Recent Labs  Lab 08/02/19 1623 08/03/19 0448  WBC 11.7* 10.2  NEUTROABS  --  6.5  HGB 10.6* 10.1*  HCT 33.1* 32.5*   MCV 94.8 96.4  PLT 191 172   Cardiac Enzymes: No results for input(s): CKTOTAL, CKMB, CKMBINDEX, TROPONINI in the last 168 hours. CBG: Recent Labs  Lab 08/03/19 2120 08/04/19 0719 08/04/19 1125 08/04/19 1705 08/04/19 2104  GLUCAP 153* 113* 128* 111* 120*    Iron Studies: No results for input(s): IRON, TIBC, TRANSFERRIN, FERRITIN in the last 72 hours. Studies/Results: US RENAL  Result Date: 08/04/2019 CLINICAL DATA:  86 year old male with acute renal insufficiency. EXAM: RENAL / URINARY TRACT ULTRASOUND COMPLETE COMPARISON:  None. FINDINGS: Right Kidney: Renal measurements: 9.9 x 5.0 x 4.2 cm = volume: 109 mL. There is moderate parenchyma atrophy and cortical thinning. Mild increased echogenicity. There is a 2 cm interpolar cyst. No hydronephrosis or shadowing stone. Left Kidney: Renal measurements: 8.5 x 5.1 x 3.9 cm = volume: 87 mL. Moderate parenchyma atrophy and cortical thinning. Mild increased echogenicity. No hydronephrosis or shadowing stone. Bladder: Appears normal for degree of bladder distention. Other: None. IMPRESSION: 1. No hydronephrosis or shadowing stone. 2. Increased renal parenchymal echogenicity and parenchymal atrophy in keeping with chronic kidney disease. Electronically Signed   By: Anner Crete M.D.   On: 08/04/2019 19:11   DG Foot Complete Right  Result Date: 08/04/2019 CLINICAL DATA:  Right foot pain with  swelling EXAM: RIGHT FOOT COMPLETE - 3+ VIEW COMPARISON:  12/25/2009 FINDINGS: No fracture or malalignment. Vascular calcifications. Degenerative changes at the first MTP joint. Arthritis at the second through fifth IP joints. Moderate plantar calcaneal spur. IMPRESSION: 1. No acute osseous abnormality 2. Arthritis at the first MTP joint and IP joints. Electronically Signed   By: Donavan Foil M.D.   On: 08/04/2019 18:39   ECHOCARDIOGRAM COMPLETE  Result Date: 08/04/2019    ECHOCARDIOGRAM REPORT   Patient Name:   Adam [name] Date of Exam: 08/04/2019 Medical Rec  #:  [id number]   Height:       64.0 in Accession #:    [id number]  Weight:       160.5 lb Date of Birth:  [birth year]    BSA:          1.782 m Patient Age:    86 years    BP:           157/81 mmHg Patient Gender: M           HR:           84 bpm. Exam Location:  Inpatient Procedure: 2D Echo, Cardiac Doppler and Color Doppler Indications:    I48.91* Unspeicified atrial fibrillation  History:        Patient has prior history of Echocardiogram examinations, most                 recent 05/01/2018. Abnormal ECG, Aortic Valve Disease,                 Arrythmias:PAC, Signs/Symptoms:Altered Mental Status, Murmur and                 Hypotension; Risk Factors:Hypertension, Dyslipidemia and                 Diabetes. Hypoxia.  Sonographer:    Roseanna Rainbow RDCS Referring Phys: [id number] SEYED A SHAHMEHDI  Sonographer Comments: Technically difficult study due to poor echo windows and suboptimal parasternal window. IMPRESSIONS  1. Left ventricular ejection fraction, by estimation, is 60 to 65%. The left ventricle has normal function. The left ventricle has no regional wall motion abnormalities. Left ventricular diastolic parameters are consistent with Grade II diastolic dysfunction (pseudonormalization).  2. Right ventricular systolic function is normal. The right ventricular size is normal.  3. Left atrial size was mild to moderately dilated.  4. Right atrial size was mildly dilated.  5. The mitral valve is grossly normal. Mild mitral valve regurgitation. No evidence of mitral stenosis.  6. The aortic valve is abnormal. Aortic valve regurgitation is trivial. Moderate aortic valve stenosis.  7. The inferior vena cava is dilated in size with >50% respiratory variability, suggesting right atrial pressure of 8 mmHg. Conclusion(s)/Recommendation(s): Frequent ectopic beats throughout study. FINDINGS  Left Ventricle: Left ventricular ejection fraction, by estimation, is 60 to 65%. The left ventricle has normal function. The left ventricle has no  regional wall motion abnormalities. The left ventricular internal cavity size was normal in size. There is  no left ventricular hypertrophy. Left ventricular diastolic parameters are consistent with Grade II diastolic dysfunction (pseudonormalization). Right Ventricle: The right ventricular size is normal. No increase in right ventricular wall thickness. Right ventricular systolic function is normal. Left Atrium: Left atrial size was mild to moderately dilated. Right Atrium: Right atrial size was mildly dilated. Pericardium: There is no evidence of pericardial effusion. Mitral Valve: The mitral valve is grossly normal. Mild mitral valve regurgitation. No evidence of mitral valve stenosis.  Tricuspid Valve: The tricuspid valve is normal in structure. Tricuspid valve regurgitation is trivial. No evidence of tricuspid stenosis. Aortic Valve: The aortic valve is abnormal. . There is severe thickening and moderate calcification of the aortic valve. Aortic valve regurgitation is trivial. Moderate aortic stenosis is present. There is severe thickening of the aortic valve. There is moderate calcification of the aortic valve. Aortic valve mean gradient measures 30.8 mmHg. Aortic valve peak gradient measures 51.2 mmHg. Aortic valve area, by VTI measures 0.75 cm. Pulmonic Valve: The pulmonic valve was not well visualized. Pulmonic valve regurgitation is not visualized. No evidence of pulmonic stenosis. Aorta: The aortic root and ascending aorta are structurally normal, with no evidence of dilitation. Pulmonary Artery: The pulmonary artery is not well seen. Venous: The inferior vena cava is dilated in size with greater than 50% respiratory variability, suggesting right atrial pressure of 8 mmHg. IAS/Shunts: No atrial level shunt detected by color flow Doppler.  LEFT VENTRICLE PLAX 2D LVIDd:         4.50 cm     Diastology LVIDs:         3.00 cm     LV e' lateral:   8.00 cm/s LV PW:         1.20 cm     LV E/e' lateral: 16.6 LV  IVS:        1.00 cm     LV e' medial:    10.00 cm/s LVOT diam:     1.90 cm     LV E/e' medial:  13.3 LV SV:         61 LV SV Index:   34 LVOT Area:     2.84 cm  LV Volumes (MOD) LV vol d, MOD A2C: 81.7 ml LV vol d, MOD A4C: 86.2 ml LV vol s, MOD A2C: 29.1 ml LV vol s, MOD A4C: 21.1 ml LV SV MOD A2C:     52.6 ml LV SV MOD A4C:     86.2 ml LV SV MOD BP:      60.5 ml RIGHT VENTRICLE         IVC TAPSE (M-mode): 2.1 cm  IVC diam: 2.40 cm LEFT ATRIUM             Index       RIGHT ATRIUM           Index LA diam:        3.90 cm 2.19 cm/m  RA Area:     18.80 cm LA Vol (A2C):   63.9 ml 35.86 ml/m RA Volume:   49.20 ml  27.61 ml/m LA Vol (A4C):   51.9 ml 29.13 ml/m LA Biplane Vol: 57.8 ml 32.44 ml/m  AORTIC VALVE AV Area (Vmax):    0.84 cm AV Area (Vmean):   0.75 cm AV Area (VTI):     0.75 cm AV Vmax:           357.80 cm/s AV Vmean:          261.800 cm/s AV VTI:            0.809 m AV Peak Grad:      51.2 mmHg AV Mean Grad:      30.8 mmHg LVOT Vmax:         106.00 cm/s LVOT Vmean:        69.100 cm/s LVOT VTI:          0.214 m LVOT/AV VTI ratio: 0.26  AORTA Ao Root diam: 3.40 cm  MITRAL VALVE MV Area (PHT): 3.37 cm     SHUNTS MV Decel Time: 225 msec     Systemic VTI:  0.21 m MV E velocity: 133.00 cm/s  Systemic Diam: 1.90 cm MV A velocity: 88.80 cm/s MV E/A ratio:  1.50 Buford Dresser MD Electronically signed by Buford Dresser MD Signature Date/Time: 08/04/2019/8:33:31 PM    Final    . vitamin C  500 mg Oral Daily  . aspirin  81 mg Oral Daily  . atorvastatin  20 mg Oral Daily  . timolol  1 drop Left Eye BID   And  . brimonidine  1 drop Left Eye BID  . cholecalciferol  2,000 Units Oral Daily  . dorzolamide-timolol  1 drop Left Eye BID  . febuxostat  40 mg Oral Once per day on Sun Tue Fri  . finasteride  5 mg Oral Daily  . hydrALAZINE  50 mg Oral Q8H  . insulin aspart  0-5 Units Subcutaneous QHS  . insulin aspart  0-9 Units Subcutaneous TID WC  . latanoprost  1 drop Both Eyes QHS  . metoprolol  tartrate  12.5 mg Oral BID  . multivitamin  1 tablet Oral BID  . sodium bicarbonate  1,300 mg Oral BID  . triamcinolone cream   Topical BID    BMET    Component Value Date/Time   NA 140 08/05/2019 0359   NA 138 02/01/2012 0920   K 4.6 08/05/2019 0359   K 4.6 02/01/2012 0920   CL 107 08/05/2019 0359   CL 113 (H) 02/01/2012 0920   CO2 21 (L) 08/05/2019 0359   CO2 21 (L) 02/01/2012 0920   GLUCOSE 117 (H) 08/05/2019 0359   GLUCOSE 130 (H) 02/01/2012 0920   BUN 29 (H) 08/05/2019 0359   BUN 28.0 (H) 02/01/2012 0920   CREATININE 2.46 (H) 08/05/2019 0359   CREATININE 1.64 (H) 12/15/2015 1025   CREATININE 1.4 (H) 02/01/2012 0920   CALCIUM 8.6 (L) 08/05/2019 0359   CALCIUM 9.5 02/01/2012 0920   GFRNONAA 23 (L) 08/05/2019 0359   GFRAA 26 (L) 08/05/2019 0359   CBC    Component Value Date/Time   WBC 10.2 08/03/2019 0448   RBC 3.37 (L) 08/03/2019 0448   HGB 10.1 (L) 08/03/2019 0448   HGB 9.8 (L) 02/01/2012 0920   HCT 32.5 (L) 08/03/2019 0448   HCT 28.0 (L) 02/01/2012 0920   PLT 172 08/03/2019 0448   PLT 160 02/01/2012 0920   MCV 96.4 08/03/2019 0448   MCV 102.0 (H) 02/01/2012 0920   MCH 30.0 08/03/2019 0448   MCHC 31.1 08/03/2019 0448   RDW 14.4 08/03/2019 0448   RDW 14.4 02/01/2012 0920   LYMPHSABS 1.7 08/03/2019 0448   LYMPHSABS 0.9 02/01/2012 0920   MONOABS 1.7 (H) 08/03/2019 0448   MONOABS 0.4 02/01/2012 0920   EOSABS 0.1 08/03/2019 0448   EOSABS 0.2 02/01/2012 0920   BASOSABS 0.1 08/03/2019 0448   BASOSABS 0.1 02/01/2012 0920    Assessment/Plan: Multifocal pneumonia, improving: Patient with multifocal pneumonia as seen on CT chest on admission. Patient on CTX 5/2 and azithromycin 5/3.  -Continue on IV azithromycin (5/3-) 500 mg every 24 hours -Continue IV CTX (5/2-) 2 g every 24 hours  AKI on Chronic kidney disease IIIb, slightly improved: Creatinine has ranged from 1.7-2 over the past year, on admission patient creatinine 2.45>2.6> 2.46 today 5/5. Urine Studies  collected, FENa 1.4% suggesting Intrinsic Etiology (ATN, AIN, Glomerulonephritides), UA with 100 protein, otherwise unremarkable. Likely has had progressive CKD  due to hypertensive nephrosclerosis. -Continue to monitor -Strict I&Os -Holding IV fluids due to increased lower extremity swelling, BNP 600 >>1400 -patient to be given p.o. dose of Lasix 20 mg, recheck labs in the a.m. -SPEP/UPEP, Light chains pending (pt w/ Hx of MGUS) -he has never seen a nephrologist before, will require outpatient follow up after discharge.  -no urgent indication for dialysis at this time as his Scr is not much off his baseline.   HTN, stable: BP this AM 148/97. Blood pressure treated at home with amlodipine 5 mg daily, clonidine 0.1 mg twice daily, furosemide 20 mg daily, hydralazine 25 mg 3 times daily, labetalol 100 mg twice daily.   -Currently treated with 50 mg hydralazine 3 times daily -Continue Metoprolol tartrate 12.5 mg twice daily and increase as tolerated/needed. -Continue to monitor blood pressure on p.o. Lasix 20 mg daily  Lower extremity swelling, stable: Patient has history of lower extremity swelling for which he is prescribed Lasix 20 mg daily.  BNP on admission 608.2, today is 1400 (5/5).  Since admission patient has received total of 2.2L (PO, IV), output record low d/t patient voiding into toilet, urinal being emptied by family and uncharted. Echo 5/4 with LVEF 60-65% and Grade 2 Diastolic Dysfunction. UA showing 100 protein, otherwise wnl. -Strict I&Os -Restarting Lasix 20 mg daily  A. fib with RVR, stable: Patient with new onset A. fib with RVR, and was controlled with diltiazem drip and heparin 11 mL/hour 5/3. Some episodes of asystole overnight. -Follow up Cardiology recommendations -Metoprolol tartrate 12.5 mg twice daily -Patient had echo 05/04 - follow up results -Transition from Heparin to Eliquis  Ankle pain, improved: new onset, no history of fall or trauma. History of gout, Uric  Acid wnl at 5.6. -PT/OT -Continue to monitor   Milus Banister, [place], PGY-2 08/05/2019 7:31 AM  I have seen and examined this patient and agree with plan and assessment in the above note with renal recommendations/intervention highlighted.  He appears markedly improved from a functional standpoint.  His BUN/Cr have slightly improved as well.  Will continue to follow and resume lasix 20 mg daily while here so we can follow his UOP and SCr.  Broadus Selim A Nataliya Graig,MD 08/05/2019 3:03 PM

## 2019-08-05 NOTE — Progress Notes (Signed)
Progress Note  Patient Name: Adam [name] Date of Encounter: 08/05/2019  Primary Cardiologist: Dorris Carnes, MD   Subjective   Feeling much better.  His breathing has improved.  He denies any palpitations or lightheadedness.  Inpatient Medications    Scheduled Meds: . vitamin C  500 mg Oral Daily  . aspirin  81 mg Oral Daily  . atorvastatin  20 mg Oral Daily  . timolol  1 drop Left Eye BID   And  . brimonidine  1 drop Left Eye BID  . cholecalciferol  2,000 Units Oral Daily  . dorzolamide-timolol  1 drop Left Eye BID  . febuxostat  40 mg Oral Once per day on Sun Tue Fri  . finasteride  5 mg Oral Daily  . hydrALAZINE  50 mg Oral Q8H  . insulin aspart  0-5 Units Subcutaneous QHS  . insulin aspart  0-9 Units Subcutaneous TID WC  . latanoprost  1 drop Both Eyes QHS  . metoprolol tartrate  12.5 mg Oral BID  . multivitamin  1 tablet Oral BID  . sodium bicarbonate  1,300 mg Oral BID  . triamcinolone cream   Topical BID   Continuous Infusions: . azithromycin 500 mg (08/04/19 2346)  . cefTRIAXone (ROCEPHIN)  IV 2 g (08/04/19 2137)  . heparin 1,100 Units/hr (08/04/19 2129)   PRN Meds: acetaminophen, hydrALAZINE, ipratropium-albuterol, metoprolol tartrate   Vital Signs    Vitals:   08/04/19 2017 08/04/19 2144 08/04/19 2341 08/05/19 0524  BP: (!) 177/74  (!) 152/92 (!) 148/97  Pulse:   (!) 121   Resp:      Temp: 99 F (37.2 C)  98.3 F (36.8 C)   TempSrc: Oral     SpO2: 93% 94% 97%   Weight:      Height:        Intake/Output Summary (Last 24 hours) at 08/05/2019 0751 Last data filed at 08/05/2019 0530 Gross per 24 hour  Intake 735.14 ml  Output 400 ml  Net 335.14 ml   Last 3 Weights 08/03/2019 08/03/2019 05/21/2018  Weight (lbs) 160 lb 7.9 oz 160 lb 149 lb  Weight (kg) 72.8 kg 72.576 kg 67.586 kg      Telemetry    Atrial fibrillation with rates in the 120s to 130s alternating with sinus bradycardia in the 30s.  Postconversion pauses greater than 4 seconds- Personally  Reviewed  ECG    08/04/2019: Sinus rhythm.  Rate 64 bpm.  Right bundle branch block.  LAFB.- Personally Reviewed  Physical Exam   VS:  BP (!) 143/77 (BP Location: Right Arm)   Pulse (!) 121   Temp 97.6 F (36.4 C) (Axillary)   Resp 20   Ht 5\' 4"  (1.626 m)   Wt 72.8 kg   SpO2 100%   BMI 27.55 kg/m  , BMI Body mass index is 27.55 kg/m. GENERAL:  Well appearing HEENT: Pupils equal round and reactive, fundi not visualized, oral mucosa unremarkable NECK:  No jugular venous distention, waveform within normal limits, carotid upstroke brisk and symmetric, no bruits LUNGS: Diffuse rhonchi HEART: Tachycardic.  Irregularly irregular.  PMI not displaced or sustained,S1 and S2 within normal limits, no S3, no S4, no clicks, no rubs, III/VI mid peaking systolic murmur ABD:  Flat, positive bowel sounds normal in frequency in pitch, no bruits, no rebound, no guarding, no midline pulsatile mass, no hepatomegaly, no splenomegaly EXT:  2 plus pulses throughout, 2+ LE edema good morning yes sorry rounding in the hospital he has to  above the ankles bilaterally, no cyanosis no clubbing SKIN:  No rashes no nodules NEURO:  Cranial nerves II through XII grossly intact, motor grossly intact throughout [facility]:  Cognitively intact, oriented to person place and time   Labs    High Sensitivity Troponin:   Recent Labs  Lab 08/02/19 1623 08/02/19 1841  TROPONINIHS 20* 18*      Chemistry Recent Labs  Lab 08/02/19 1623 08/02/19 1623 08/03/19 0326 08/04/19 0342 08/05/19 0359  NA 138   < > 137 140 140  K 4.5   < > 5.5* 4.3 4.6  CL 106   < > 105 108 107  CO2 21*   < > 21* 24 21*  GLUCOSE 121*   < > 112* 122* 117*  BUN 32*   < > 33* 33* 29*  CREATININE 2.45*   < > 2.60* 2.59* 2.46*  CALCIUM 8.9   < > 8.6* 8.3* 8.6*  PROT 6.4*  --  5.8*  --   --   ALBUMIN 3.8  --  3.4*  --   --   AST 14*  --  20  --   --   ALT 12  --  10  --   --   ALKPHOS 57  --  50  --   --   BILITOT 0.7  --  1.5*  --   --    GFRNONAA 23*   < > 21* 21* 23*  GFRAA 26*   < > 25* 25* 26*  ANIONGAP 11   < > 11 8 12    < > = values in this interval not displayed.     Hematology Recent Labs  Lab 08/02/19 1623 08/03/19 0448  WBC 11.7* 10.2  RBC 3.49* 3.37*  HGB 10.6* 10.1*  HCT 33.1* 32.5*  MCV 94.8 96.4  MCH 30.4 30.0  MCHC 32.0 31.1  RDW 14.2 14.4  PLT 191 172    BNP Recent Labs  Lab 08/02/19 1623 08/04/19 0639 08/05/19 0359  BNP 608.2* 1,116.7* 1,476.0*     DDimer  Recent Labs  Lab 08/02/19 1841  DDIMER 0.68*     Radiology    US RENAL  Result Date: 08/04/2019 CLINICAL DATA:  86 year old male with acute renal insufficiency. EXAM: RENAL / URINARY TRACT ULTRASOUND COMPLETE COMPARISON:  None. FINDINGS: Right Kidney: Renal measurements: 9.9 x 5.0 x 4.2 cm = volume: 109 mL. There is moderate parenchyma atrophy and cortical thinning. Mild increased echogenicity. There is a 2 cm interpolar cyst. No hydronephrosis or shadowing stone. Left Kidney: Renal measurements: 8.5 x 5.1 x 3.9 cm = volume: 87 mL. Moderate parenchyma atrophy and cortical thinning. Mild increased echogenicity. No hydronephrosis or shadowing stone. Bladder: Appears normal for degree of bladder distention. Other: None. IMPRESSION: 1. No hydronephrosis or shadowing stone. 2. Increased renal parenchymal echogenicity and parenchymal atrophy in keeping with chronic kidney disease. Electronically Signed   By: Anner Crete M.D.   On: 08/04/2019 19:11   DG Foot Complete Right  Result Date: 08/04/2019 CLINICAL DATA:  Right foot pain with swelling EXAM: RIGHT FOOT COMPLETE - 3+ VIEW COMPARISON:  12/25/2009 FINDINGS: No fracture or malalignment. Vascular calcifications. Degenerative changes at the first MTP joint. Arthritis at the second through fifth IP joints. Moderate plantar calcaneal spur. IMPRESSION: 1. No acute osseous abnormality 2. Arthritis at the first MTP joint and IP joints. Electronically Signed   By: Donavan Foil M.D.   On:  08/04/2019 18:39   ECHOCARDIOGRAM COMPLETE  Result Date: 08/04/2019  ECHOCARDIOGRAM REPORT   Patient Name:   Adam [name] Date of Exam: 08/04/2019 Medical Rec #:  [id number]   Height:       64.0 in Accession #:    [id number]  Weight:       160.5 lb Date of Birth:  Feb 01, 1931    BSA:          1.782 m Patient Age:    36 years    BP:           157/81 mmHg Patient Gender: M           HR:           84 bpm. Exam Location:  Inpatient Procedure: 2D Echo, Cardiac Doppler and Color Doppler Indications:    I48.91* Unspeicified atrial fibrillation  History:        Patient has prior history of Echocardiogram examinations, most                 recent 05/01/2018. Abnormal ECG, Aortic Valve Disease,                 Arrythmias:PAC, Signs/Symptoms:Altered Mental Status, Murmur and                 Hypotension; Risk Factors:Hypertension, Dyslipidemia and                 Diabetes. Hypoxia.  Sonographer:    Roseanna Rainbow RDCS Referring Phys: [id number] SEYED A SHAHMEHDI  Sonographer Comments: Technically difficult study due to poor echo windows and suboptimal parasternal window. IMPRESSIONS  1. Left ventricular ejection fraction, by estimation, is 60 to 65%. The left ventricle has normal function. The left ventricle has no regional wall motion abnormalities. Left ventricular diastolic parameters are consistent with Grade II diastolic dysfunction (pseudonormalization).  2. Right ventricular systolic function is normal. The right ventricular size is normal.  3. Left atrial size was mild to moderately dilated.  4. Right atrial size was mildly dilated.  5. The mitral valve is grossly normal. Mild mitral valve regurgitation. No evidence of mitral stenosis.  6. The aortic valve is abnormal. Aortic valve regurgitation is trivial. Moderate aortic valve stenosis.  7. The inferior vena cava is dilated in size with >50% respiratory variability, suggesting right atrial pressure of 8 mmHg. Conclusion(s)/Recommendation(s): Frequent ectopic beats throughout  study. FINDINGS  Left Ventricle: Left ventricular ejection fraction, by estimation, is 60 to 65%. The left ventricle has normal function. The left ventricle has no regional wall motion abnormalities. The left ventricular internal cavity size was normal in size. There is  no left ventricular hypertrophy. Left ventricular diastolic parameters are consistent with Grade II diastolic dysfunction (pseudonormalization). Right Ventricle: The right ventricular size is normal. No increase in right ventricular wall thickness. Right ventricular systolic function is normal. Left Atrium: Left atrial size was mild to moderately dilated. Right Atrium: Right atrial size was mildly dilated. Pericardium: There is no evidence of pericardial effusion. Mitral Valve: The mitral valve is grossly normal. Mild mitral valve regurgitation. No evidence of mitral valve stenosis. Tricuspid Valve: The tricuspid valve is normal in structure. Tricuspid valve regurgitation is trivial. No evidence of tricuspid stenosis. Aortic Valve: The aortic valve is abnormal. . There is severe thickening and moderate calcification of the aortic valve. Aortic valve regurgitation is trivial. Moderate aortic stenosis is present. There is severe thickening of the aortic valve. There is moderate calcification of the aortic valve. Aortic valve mean gradient measures 30.8 mmHg. Aortic valve peak gradient measures 51.2 mmHg. Aortic  valve area, by VTI measures 0.75 cm. Pulmonic Valve: The pulmonic valve was not well visualized. Pulmonic valve regurgitation is not visualized. No evidence of pulmonic stenosis. Aorta: The aortic root and ascending aorta are structurally normal, with no evidence of dilitation. Pulmonary Artery: The pulmonary artery is not well seen. Venous: The inferior vena cava is dilated in size with greater than 50% respiratory variability, suggesting right atrial pressure of 8 mmHg. IAS/Shunts: No atrial level shunt detected by color flow Doppler.  LEFT  VENTRICLE PLAX 2D LVIDd:         4.50 cm     Diastology LVIDs:         3.00 cm     LV e' lateral:   8.00 cm/s LV PW:         1.20 cm     LV E/e' lateral: 16.6 LV IVS:        1.00 cm     LV e' medial:    10.00 cm/s LVOT diam:     1.90 cm     LV E/e' medial:  13.3 LV SV:         61 LV SV Index:   34 LVOT Area:     2.84 cm  LV Volumes (MOD) LV vol d, MOD A2C: 81.7 ml LV vol d, MOD A4C: 86.2 ml LV vol s, MOD A2C: 29.1 ml LV vol s, MOD A4C: 21.1 ml LV SV MOD A2C:     52.6 ml LV SV MOD A4C:     86.2 ml LV SV MOD BP:      60.5 ml RIGHT VENTRICLE         IVC TAPSE (M-mode): 2.1 cm  IVC diam: 2.40 cm LEFT ATRIUM             Index       RIGHT ATRIUM           Index LA diam:        3.90 cm 2.19 cm/m  RA Area:     18.80 cm LA Vol (A2C):   63.9 ml 35.86 ml/m RA Volume:   49.20 ml  27.61 ml/m LA Vol (A4C):   51.9 ml 29.13 ml/m LA Biplane Vol: 57.8 ml 32.44 ml/m  AORTIC VALVE AV Area (Vmax):    0.84 cm AV Area (Vmean):   0.75 cm AV Area (VTI):     0.75 cm AV Vmax:           357.80 cm/s AV Vmean:          261.800 cm/s AV VTI:            0.809 m AV Peak Grad:      51.2 mmHg AV Mean Grad:      30.8 mmHg LVOT Vmax:         106.00 cm/s LVOT Vmean:        69.100 cm/s LVOT VTI:          0.214 m LVOT/AV VTI ratio: 0.26  AORTA Ao Root diam: 3.40 cm MITRAL VALVE MV Area (PHT): 3.37 cm     SHUNTS MV Decel Time: 225 msec     Systemic VTI:  0.21 m MV E velocity: 133.00 cm/s  Systemic Diam: 1.90 cm MV A velocity: 88.80 cm/s MV E/A ratio:  1.50 Buford Dresser MD Electronically signed by Buford Dresser MD Signature Date/Time: 08/04/2019/8:33:31 PM    Final     Cardiac Studies   Echo 08/04/19:   1. Left ventricular ejection  fraction, by estimation, is 60 to 65%. The  left ventricle has normal function. The left ventricle has no regional  wall motion abnormalities. Left ventricular diastolic parameters are  consistent with Grade II diastolic  dysfunction (pseudonormalization).  2. Right ventricular systolic  function is normal. The right ventricular  size is normal.  3. Left atrial size was mild to moderately dilated.  4. Right atrial size was mildly dilated.  5. The mitral valve is grossly normal. Mild mitral valve regurgitation.  No evidence of mitral stenosis.  6. The aortic valve is abnormal. Aortic valve regurgitation is trivial.  Moderate aortic valve stenosis.  7. The inferior vena cava is dilated in size with >50% respiratory  variability, suggesting right atrial pressure of 8 mmHg.   Patient Profile     Mr. Huckins is an 89M with hypertension, DM, CKD IV and mild aortic stenosis admitted with back pain and found to have pneumonia.  Since admission he also went into atrial fibrillation with RVR.  Assessment & Plan    # Paroxysmal atrial fibrillation: # Sick sinus syndrome: Patient is going in and out of atrial fibrillation and has post-conversion pauses and bradycardia.  He is currently on metoprolol 12.5mg  bid and not rate controlled.  We will try adding amiodarone IV.  He will likely need a PPM.  We will get EP to see him.  Will need to clarify how long he needs to wait given that he is being treated for CAP.    # Aortic stenosis: Moderate on echo this admission.  Mean gradient 8 mmHg. Monitor as outpatient.  # Hypertension:  BP reasonably controlled on metoprolol and hydralazine.   # CKD IV:  Creatinine stable.   # CAP: Per primary team.       For questions or updates, please contact [place] Please consult [email or web address] for contact info under        Signed, Skeet Latch, MD  08/05/2019, 7:51 AM

## 2019-08-05 NOTE — Progress Notes (Signed)
ANTICOAGULATION CONSULT NOTE - Follow Up Consult  Pharmacy Consult for IV Heparin Indication: atrial fibrillation (New onset)  No Known Allergies  Patient Measurements: Height: 5\' 4"  (162.6 cm) Weight: 72.8 kg (160 lb 7.9 oz) IBW/kg (Calculated) : 59.2 Heparin Dosing Weight: 72.8 kg  Vital Signs: Temp: 97.6 F (36.4 C) (05/05 0758) Temp Source: Axillary (05/05 0758) BP: 143/77 (05/05 0758) Pulse Rate: 121 (05/05 0831)  Labs: Recent Labs    08/02/19 1623 08/02/19 1623 08/02/19 1841 08/03/19 0326 08/03/19 0448 08/04/19 0342 08/04/19 0342 08/04/19 0933 08/04/19 1823 08/05/19 0359  HGB 10.6*  --   --   --  10.1*  --   --   --   --   --   HCT 33.1*  --   --   --  32.5*  --   --   --   --   --   PLT 191  --   --   --  172  --   --   --   --   --   HEPARINUNFRC  --   --   --   --   --  0.34   < > 0.21* 0.34 0.46  CREATININE 2.45*   < >  --  2.60*  --  2.59*  --   --   --  2.46*  TROPONINIHS 20*  --  18*  --   --   --   --   --   --   --    < > = values in this interval not displayed.    Estimated Creatinine Clearance: 19.3 mL/min (A) (by C-G formula based on SCr of 2.46 mg/dL (H)).   Medications:  Infusions:  . cefTRIAXone (ROCEPHIN)  IV 2 g (08/04/19 2137)  . heparin 1,100 Units/hr (08/04/19 2129)    Assessment: 86 year old on IV heparin for new onset atrial fibrillation.  Heparin level continue to be therapeutic. We will continue the same rate.   Scr 1.46  Goal of Therapy:  Heparin level 0.3-0.7 units/ml Monitor platelets by anticoagulation protocol: Yes   Plan:  Continue Heparin drip 1100 units/hr.  Daily heparin level and CBC while on therapy.    Onnie Boer, PharmD, BCIDP, AAHIVP, CPP Infectious Disease Pharmacist 08/05/2019 11:26 AM
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
## 2019-08-05 NOTE — Progress Notes (Signed)
Received several calls from telemetry that patient was having pauses (asytole). The longest pause was 4.8 seconds. Pt was assessed after each call, and each time he was alert. Physician (Dr. Myna Hidalgo) was notified. He stated he would consult will cardiology. Will continue to monitor patient.

## 2019-08-05 NOTE — Evaluation (Signed)
Physical Therapy Evaluation Patient Details Name: Adam [name] MRN: [id number] DOB: 09/07/1930 Today's Date: 08/05/2019   History of Present Illness  86 y.o. male with a hx of hypertension, diet-controlled diabetes mellitus, GERD, BPH, chronic kidney disease IV, and gait imbalance admitted to ED on 5/2 from [facility] with multifocal PNA, and developed afib with RVR. Cardiology following.  Clinical Impression   Pt presents with increased time and effort to mobilize, poor standing balance requiring AD and assist for steadying, dyspnea on exertion with sats maintained 91% and greater on RA, and decreased activity tolerance. Pt to benefit from acute PT to address deficits. Pt ambulated hallway distance with use of RW and steadying assist from PT, at baseline pt walks independently and good distances to and from dining room, ILF pub, and exercise room. Pt not at baseline and would benefit from increased supervision, PT presently recommending SNF level of care post-acutely to return to independence. Pt is hopeful to return to independence prior to d/c, may progress to [facility] services. PT to progress mobility as tolerated, and will continue to follow acutely.      Follow Up Recommendations SNF(vs back to ILF with [facility] services pending pt progress with mobility)    Equipment Recommendations  None recommended by PT    Recommendations for Other Services       Precautions / Restrictions Precautions Precautions: Fall Restrictions Weight Bearing Restrictions: No      Mobility  Bed Mobility               General bed mobility comments: up in chair upon arrival to room, returned to chair upon PT exit.  Transfers Overall transfer level: Needs assistance Equipment used: Rolling walker (2 wheeled) Transfers: Sit to/from Stand Sit to Stand: Min guard         General transfer comment: min guard for safety, verbal cuing for hand placement when rising as pt not used to using  RW.  Ambulation/Gait Ambulation/Gait assistance: Min assist;Min guard Gait Distance (Feet): 80 Feet Assistive device: Rolling walker (2 wheeled) Gait Pattern/deviations: Step-through pattern;Decreased stride length;Trunk flexed Gait velocity: decr   General Gait Details: SpO2 91% and greater on RA, DOE 2/4 with accessory muscle breathing recovered with rest.  Stairs            Wheelchair Mobility    Modified Rankin (Stroke Patients Only)       Balance Overall balance assessment: Needs assistance Sitting-balance support: No upper extremity supported;Feet supported Sitting balance-Leahy Scale: Good Sitting balance - Comments: able to lean forward and adjust his socks, right self without UE support   Standing balance support: Bilateral upper extremity supported;During functional activity Standing balance-Leahy Scale: Poor Standing balance comment: unsteady, reliant on external support                             Pertinent Vitals/Pain Pain Assessment: Faces Pain Score: 0-No pain Pain Location: R foot no longer hurting, one day of acute pain with negative xrays and gout workup Pain Intervention(s): Monitored during session    Home Living Family/patient expects to be discharged to:: Private residence Living Arrangements: Alone Available Help at Discharge: Family;Available PRN/intermittently Type of Home: Independent living facility Home Access: Elevator     Home Layout: One level Home Equipment: Grab bars - tub/shower;Hand held shower head;Shower seat;Grab bars - toilet;Walker - 4 wheels      Prior Function Level of Independence: Independent  Comments: participates in exercise and walks to dining room, drives. Pt uses rollator to carry weights in his exercise class. Pt meets his friends at the [employer] pub every friday for "happy hour"     Hand Dominance   Dominant Hand: Right    Extremity/Trunk Assessment   Upper Extremity  Assessment Upper Extremity Assessment: Defer to OT evaluation    Lower Extremity Assessment Lower Extremity Assessment: Generalized weakness    Cervical / Trunk Assessment Cervical / Trunk Assessment: Normal  Communication   Communication: HOH  Cognition Arousal/Alertness: Awake/alert Behavior During Therapy: WFL for tasks assessed/performed Overall Cognitive Status: Within Functional Limits for tasks assessed                                 General Comments: Pleasant, laughing and joking with PT/Ot during session. Answers questions appropriately, lacks some insight into deficits because pt is used to being fully independent.      General Comments General comments (skin integrity, edema, etc.): SpO2 90% and greater on RA    Exercises     Assessment/Plan    PT Assessment Patient needs continued PT services  PT Problem List Decreased strength;Decreased mobility;Decreased safety awareness;Decreased activity tolerance;Decreased balance;Decreased knowledge of use of DME;Cardiopulmonary status limiting activity       PT Treatment Interventions DME instruction;Therapeutic activities;Gait training;Patient/family education;Therapeutic exercise;Balance training;Stair training;Functional mobility training    PT Goals (Current goals can be found in the Care Plan section)  Acute Rehab PT Goals Patient Stated Goal: go back to ILF, return to independence PT Goal Formulation: With patient Time For Goal Achievement: 08/19/19 Potential to Achieve Goals: Good    Frequency Min 3X/week(hopeful to d/c home, frequency to reflect this)   Barriers to discharge        Co-evaluation PT/OT/SLP Co-Evaluation/Treatment: Yes Reason for Co-Treatment: For patient/therapist safety PT goals addressed during session: Mobility/safety with mobility;Proper use of DME         AM-PAC PT "6 Clicks" Mobility  Outcome Measure Help needed turning from your back to your side while in a flat  bed without using bedrails?: A Little Help needed moving from lying on your back to sitting on the side of a flat bed without using bedrails?: A Little Help needed moving to and from a bed to a chair (including a wheelchair)?: A Little Help needed standing up from a chair using your arms (e.g., wheelchair or bedside chair)?: A Little Help needed to walk in hospital room?: A Little Help needed climbing 3-5 steps with a railing? : A Lot 6 Click Score: 17    End of Session   Activity Tolerance: Patient limited by fatigue Patient left: in chair;with call bell/phone within reach;with family/visitor present Nurse Communication: Mobility status PT Visit Diagnosis: Other abnormalities of gait and mobility (R26.89);Muscle weakness (generalized) (M62.81)    Time: 6295-2841 PT Time Calculation (min) (ACUTE ONLY): 20 min   Charges:   PT Evaluation $PT Eval Low Complexity: 1 Low         Carlen Fils E, PT Acute Rehabilitation Services Pager [phone]  Office [phone]  Kyjuan Gause D [place] Nordquist 08/05/2019, 11:11 AM

## 2019-08-05 NOTE — Progress Notes (Signed)
PROGRESS NOTE    Adam [name]  QVZ:[id number] DOB: Oct 17, 1930 DOA: 08/02/2019 PCP: Javier Glazier, MD     Brief Narrative:  Adam [name] is an 86 year old male with past medical history significant for hyperlipidemia, hypertension who presented to the hospital on 5/2 due to back pain, abdominal pain and shortness of breath.  Work-up in the emergency department revealed multifocal pneumonia and patient was admitted and started on IV antibiotics.  On 5/3, patient went into A. fib RVR and was started on IV Cardizem, IV heparin.  Cardiology was consulted at that time.  Also had postconversion pauses seen on telemetry.  Nephrology also following for acute kidney injury.  New events last 24 hours / Subjective: States that he had some shortness of breath and wheezes this morning, feeling better after receiving breathing treatment.  He has no complaints of any chest pain.  States that his right foot pain has improved as well as swelling.  Still has some weakness especially moving around the room.  Assessment & Plan:   Principal Problem:   CAP (community acquired pneumonia) Active Problems:   HTN (hypertension)   HLD (hyperlipidemia)   Type 2 diabetes mellitus (HCC)   BPH (benign prostatic hyperplasia)   Leucocytosis   ARF (acute renal failure) (HCC)   Hypoxia   Atrial fibrillation, new onset (HCC)   Atrial fibrillation with RVR (HCC)   Multifocal pneumonia -Chest x-ray showed mild hazy infrahilar opacification right worse than left -CT chest revealed small bilateral pleural effusion, concern for multifocal pneumonia -Blood cultures negative to date -SARS-CoV-2, influenza negative -Doxycycline, Rocephin  A. fib RVR -With postconversion pauses and bradycardia -IV heparin -Continue metoprolol -Appreciate cardiology -Awaiting EP evaluation  Acute kidney injury on CKD stage IIIb -Baseline creatinine 1.7 -Renal ultrasound without hydronephrosis, increased renal parenchymal echogenicity  and parenchymal atrophy consistent with chronic kidney disease -Appreciate nephrology  Elevated troponin -Flat trend, 20 --> 18 -Likely demand ischemia secondary to above  Type 2 diabetes, well controlled -Hemoglobin A1c 5.7 -Sliding scale insulin  Hypertension -Continue hydralazine, metoprolol  Hyperlipidemia -Continue Lipitor  BPH -Continue Proscar   DVT prophylaxis: IV heparin Code Status: DNR Family Communication: Daughter at bedside Disposition Plan:   Status is: Inpatient  Remains inpatient appropriate because:IV treatments appropriate due to intensity of illness or inability to take PO and Inpatient level of care appropriate due to severity of illness   Dispo: The patient is from: Independent living facility              Anticipated d/c is to: Independent living facility versus SNF, pending PT OT              Anticipated d/c date is: 2 days              Patient currently is not medically stable to d/c.  Currently awaiting EP evaluation, improvement in AKI   Consultants:   Cardiology  Nephrology   Antimicrobials:  Anti-infectives (From admission, onward)   Start     Dose/Rate Route Frequency Ordered Stop   08/05/19 1200  doxycycline (VIBRA-TABS) tablet 100 mg     100 mg Oral Every 12 hours 08/05/19 1121     08/04/19 0000  azithromycin (ZITHROMAX) 500 mg in sodium chloride 0.9 % 250 mL IVPB  Status:  Discontinued     500 mg 250 mL/hr over 60 Minutes Intravenous Every 24 hours 08/03/19 0325 08/05/19 1121   08/03/19 2200  cefTRIAXone (ROCEPHIN) 1 g in sodium chloride 0.9 % 100 mL IVPB  Status:  Discontinued     1 g 200 mL/hr over 30 Minutes Intravenous Every 24 hours 08/03/19 0325 08/03/19 2118   08/03/19 2200  cefTRIAXone (ROCEPHIN) 2 g in sodium chloride 0.9 % 100 mL IVPB     2 g 200 mL/hr over 30 Minutes Intravenous Every 24 hours 08/03/19 2118     08/02/19 2030  cefTRIAXone (ROCEPHIN) 2 g in sodium chloride 0.9 % 100 mL IVPB  Status:  Discontinued      2 g 200 mL/hr over 30 Minutes Intravenous Every 24 hours 08/02/19 2019 08/03/19 0349   08/02/19 2030  azithromycin (ZITHROMAX) 500 mg in sodium chloride 0.9 % 250 mL IVPB  Status:  Discontinued     500 mg 250 mL/hr over 60 Minutes Intravenous Every 24 hours 08/02/19 2019 08/03/19 0350        Objective: Vitals:   08/05/19 0524 08/05/19 0758 08/05/19 0831 08/05/19 1334  BP: (!) 148/97 (!) 143/77  (!) 165/79  Pulse:  (!) 110 (!) 121 96  Resp:  20  19  Temp:  97.6 F (36.4 C)  98 F (36.7 C)  TempSrc:  Axillary  Oral  SpO2:  100%  93%  Weight:      Height:        Intake/Output Summary (Last 24 hours) at 08/05/2019 1408 Last data filed at 08/05/2019 0530 Gross per 24 hour  Intake 735.14 ml  Output 400 ml  Net 335.14 ml   Filed Weights   08/03/19 0315 08/03/19 1048  Weight: 72.6 kg 72.8 kg    Examination:  General exam: Appears calm and comfortable  Respiratory system: Crackles right lung base, on nasal cannula O2 Cardiovascular system: S1 & S2 heard, irregular rhythm, rate 120s. Trace pedal edema. Gastrointestinal system: Abdomen is nondistended, soft and nontender. Normal bowel sounds heard. Central nervous system: Alert and oriented. No focal neurological deficits. Speech clear.  Extremities: Symmetric in appearance  Skin: No rashes, lesions or ulcers on exposed skin  Psychiatry: Judgement and insight appear normal. Mood & affect appropriate.   Data Reviewed: I have personally reviewed following labs and imaging studies  CBC: Recent Labs  Lab 08/02/19 1623 08/03/19 0448  WBC 11.7* 10.2  NEUTROABS  --  6.5  HGB 10.6* 10.1*  HCT 33.1* 32.5*  MCV 94.8 96.4  PLT 191 366   Basic Metabolic Panel: Recent Labs  Lab 08/02/19 1623 08/03/19 0326 08/04/19 0342 08/05/19 0359  NA 138 137 140 140  K 4.5 5.5* 4.3 4.6  CL 106 105 108 107  CO2 21* 21* 24 21*  GLUCOSE 121* 112* 122* 117*  BUN 32* 33* 33* 29*  CREATININE 2.45* 2.60* 2.59* 2.46*  CALCIUM 8.9 8.6* 8.3*  8.6*  MG  --   --  2.0 2.0   GFR: Estimated Creatinine Clearance: 19.3 mL/min (A) (by C-G formula based on SCr of 2.46 mg/dL (H)). Liver Function Tests: Recent Labs  Lab 08/02/19 1623 08/03/19 0326  AST 14* 20  ALT 12 10  ALKPHOS 57 50  BILITOT 0.7 1.5*  PROT 6.4* 5.8*  ALBUMIN 3.8 3.4*   Recent Labs  Lab 08/02/19 1623  LIPASE 36   Recent Labs  Lab 08/05/19 0359  AMMONIA 17   Coagulation Profile: No results for input(s): INR, PROTIME in the last 168 hours. Cardiac Enzymes: No results for input(s): CKTOTAL, CKMB, CKMBINDEX, TROPONINI in the last 168 hours. BNP (last 3 results) No results for input(s): PROBNP in the last 8760 hours. HbA1C: Recent Labs  08/03/19 0448  HGBA1C 5.7*   CBG: Recent Labs  Lab 08/04/19 1125 08/04/19 1705 08/04/19 2104 08/05/19 0742 08/05/19 1314  GLUCAP 128* 111* 120* 110* 158*   Lipid Profile: Recent Labs    08/05/19 0359  CHOL 106  HDL 33*  LDLCALC 56  TRIG 85  CHOLHDL 3.2   Thyroid Function Tests: Recent Labs    08/04/19 0342 08/04/19 0639  TSH 2.985  --   FREET4  --  1.28*  T3FREE  --  1.9*   Anemia Panel: Recent Labs    08/05/19 0359  VITAMINB12 501   Sepsis Labs: Recent Labs  Lab 08/04/19 0639 08/04/19 0933  PROCALCITON 0.94  --   LATICACIDVEN 1.2 1.4    Recent Results (from the past 240 hour(s))  Respiratory Panel by RT PCR (Flu A&B, Covid) - Nasopharyngeal Swab     Status: None   Collection Time: 08/02/19  6:18 PM   Specimen: Nasopharyngeal Swab  Result Value Ref Range Status   SARS Coronavirus 2 by RT PCR NEGATIVE NEGATIVE Final    Comment: (NOTE) SARS-CoV-2 target nucleic acids are NOT DETECTED. The SARS-CoV-2 RNA is generally detectable in upper respiratoy specimens during the acute phase of infection. The lowest concentration of SARS-CoV-2 viral copies this assay can detect is 131 copies/mL. A negative result does not preclude SARS-Cov-2 infection and should not be used as the sole  basis for treatment or other patient management decisions. A negative result may occur with  improper specimen collection/handling, submission of specimen other than nasopharyngeal swab, presence of viral mutation(s) within the areas targeted by this assay, and inadequate number of viral copies (<131 copies/mL). A negative result must be combined with clinical observations, patient history, and epidemiological information. The expected result is Negative. Fact Sheet for Patients:  PinkCheek.be Fact Sheet for Healthcare Providers:  GravelBags.it This test is not yet ap proved or cleared by the [place] FDA and  has been authorized for detection and/or diagnosis of SARS-CoV-2 by FDA under an Emergency Use Authorization (EUA). This EUA will remain  in effect (meaning this test can be used) for the duration of the COVID-19 declaration under Section 564(b)(1) of the Act, 21 U.S.C. section 360bbb-3(b)(1), unless the authorization is terminated or revoked sooner.    Influenza A by PCR NEGATIVE NEGATIVE Final   Influenza B by PCR NEGATIVE NEGATIVE Final    Comment: (NOTE) The Xpert Xpress SARS-CoV-2/FLU/RSV assay is intended as an aid in  the diagnosis of influenza from Nasopharyngeal swab specimens and  should not be used as a sole basis for treatment. Nasal washings and  aspirates are unacceptable for Xpert Xpress SARS-CoV-2/FLU/RSV  testing. Fact Sheet for Patients: PinkCheek.be Fact Sheet for Healthcare Providers: GravelBags.it This test is not yet approved or cleared by the [place] FDA and  has been authorized for detection and/or diagnosis of SARS-CoV-2 by  FDA under an Emergency Use Authorization (EUA). This EUA will remain  in effect (meaning this test can be used) for the duration of the  Covid-19 declaration under Section 564(b)(1) of the Act, 21  U.S.C.  section 360bbb-3(b)(1), unless the authorization is  terminated or revoked. Performed at [facility], [place] [address], [place], [place] [address]   Culture, blood (routine x 2) Call MD if unable to obtain prior to antibiotics being given     Status: None (Preliminary result)   Collection Time: 08/03/19  4:20 AM   Specimen: BLOOD  Result Value Ref Range Status  Specimen Description BLOOD RIGHT WRIST  Final   Special Requests   Final    BOTTLES DRAWN AEROBIC AND ANAEROBIC Blood Culture results may not be optimal due to an inadequate volume of blood received in culture bottles   Culture   Final    NO GROWTH 2 DAYS Performed at [facility], [place] [address] [place], [place] [address]    Report Status PENDING  Incomplete  Culture, blood (Routine X 2) w Reflex to ID Panel     Status: None (Preliminary result)   Collection Time: 08/03/19 10:43 PM   Specimen: BLOOD RIGHT ARM  Result Value Ref Range Status   Specimen Description BLOOD RIGHT ARM  Final   Special Requests   Final    BOTTLES DRAWN AEROBIC AND ANAEROBIC Blood Culture adequate volume   Culture   Final    NO GROWTH 2 DAYS Performed at [facility], [place] [address], [place], [place] [address]    Report Status PENDING  Incomplete      Radiology Studies: US RENAL  Result Date: 08/04/2019 CLINICAL DATA:  86 year old male with acute renal insufficiency. EXAM: RENAL / URINARY TRACT ULTRASOUND COMPLETE COMPARISON:  None. FINDINGS: Right Kidney: Renal measurements: 9.9 x 5.0 x 4.2 cm = volume: 109 mL. There is moderate parenchyma atrophy and cortical thinning. Mild increased echogenicity. There is a 2 cm interpolar cyst. No hydronephrosis or shadowing stone. Left Kidney: Renal measurements: 8.5 x 5.1 x 3.9 cm = volume: 87 mL. Moderate parenchyma atrophy and cortical thinning. Mild increased echogenicity. No hydronephrosis or shadowing stone. Bladder: Appears normal for degree of bladder distention. Other: None.  IMPRESSION: 1. No hydronephrosis or shadowing stone. 2. Increased renal parenchymal echogenicity and parenchymal atrophy in keeping with chronic kidney disease. Electronically Signed   By: Anner Crete M.D.   On: 08/04/2019 19:11   DG Foot Complete Right  Result Date: 08/04/2019 CLINICAL DATA:  Right foot pain with swelling EXAM: RIGHT FOOT COMPLETE - 3+ VIEW COMPARISON:  12/25/2009 FINDINGS: No fracture or malalignment. Vascular calcifications. Degenerative changes at the first MTP joint. Arthritis at the second through fifth IP joints. Moderate plantar calcaneal spur. IMPRESSION: 1. No acute osseous abnormality 2. Arthritis at the first MTP joint and IP joints. Electronically Signed   By: Donavan Foil M.D.   On: 08/04/2019 18:39   ECHOCARDIOGRAM COMPLETE  Result Date: 08/04/2019    ECHOCARDIOGRAM REPORT   Patient Name:   Adam [name] Date of Exam: 08/04/2019 Medical Rec #:  [id number]   Height:       64.0 in Accession #:    [id number]  Weight:       160.5 lb Date of Birth:  11-02-1930    BSA:          1.782 m Patient Age:    8 years    BP:           157/81 mmHg Patient Gender: M           HR:           84 bpm. Exam Location:  Inpatient Procedure: 2D Echo, Cardiac Doppler and Color Doppler Indications:    I48.91* Unspeicified atrial fibrillation  History:        Patient has prior history of Echocardiogram examinations, most                 recent 05/01/2018. Abnormal ECG, Aortic Valve Disease,  Arrythmias:PAC, Signs/Symptoms:Altered Mental Status, Murmur and                 Hypotension; Risk Factors:Hypertension, Dyslipidemia and                 Diabetes. Hypoxia.  Sonographer:    Roseanna Rainbow RDCS Referring Phys: [id number] SEYED A SHAHMEHDI  Sonographer Comments: Technically difficult study due to poor echo windows and suboptimal parasternal window. IMPRESSIONS  1. Left ventricular ejection fraction, by estimation, is 60 to 65%. The left ventricle has normal function. The left ventricle has no  regional wall motion abnormalities. Left ventricular diastolic parameters are consistent with Grade II diastolic dysfunction (pseudonormalization).  2. Right ventricular systolic function is normal. The right ventricular size is normal.  3. Left atrial size was mild to moderately dilated.  4. Right atrial size was mildly dilated.  5. The mitral valve is grossly normal. Mild mitral valve regurgitation. No evidence of mitral stenosis.  6. The aortic valve is abnormal. Aortic valve regurgitation is trivial. Moderate aortic valve stenosis.  7. The inferior vena cava is dilated in size with >50% respiratory variability, suggesting right atrial pressure of 8 mmHg. Conclusion(s)/Recommendation(s): Frequent ectopic beats throughout study. FINDINGS  Left Ventricle: Left ventricular ejection fraction, by estimation, is 60 to 65%. The left ventricle has normal function. The left ventricle has no regional wall motion abnormalities. The left ventricular internal cavity size was normal in size. There is  no left ventricular hypertrophy. Left ventricular diastolic parameters are consistent with Grade II diastolic dysfunction (pseudonormalization). Right Ventricle: The right ventricular size is normal. No increase in right ventricular wall thickness. Right ventricular systolic function is normal. Left Atrium: Left atrial size was mild to moderately dilated. Right Atrium: Right atrial size was mildly dilated. Pericardium: There is no evidence of pericardial effusion. Mitral Valve: The mitral valve is grossly normal. Mild mitral valve regurgitation. No evidence of mitral valve stenosis. Tricuspid Valve: The tricuspid valve is normal in structure. Tricuspid valve regurgitation is trivial. No evidence of tricuspid stenosis. Aortic Valve: The aortic valve is abnormal. . There is severe thickening and moderate calcification of the aortic valve. Aortic valve regurgitation is trivial. Moderate aortic stenosis is present. There is severe  thickening of the aortic valve. There is moderate calcification of the aortic valve. Aortic valve mean gradient measures 30.8 mmHg. Aortic valve peak gradient measures 51.2 mmHg. Aortic valve area, by VTI measures 0.75 cm. Pulmonic Valve: The pulmonic valve was not well visualized. Pulmonic valve regurgitation is not visualized. No evidence of pulmonic stenosis. Aorta: The aortic root and ascending aorta are structurally normal, with no evidence of dilitation. Pulmonary Artery: The pulmonary artery is not well seen. Venous: The inferior vena cava is dilated in size with greater than 50% respiratory variability, suggesting right atrial pressure of 8 mmHg. IAS/Shunts: No atrial level shunt detected by color flow Doppler.  LEFT VENTRICLE PLAX 2D LVIDd:         4.50 cm     Diastology LVIDs:         3.00 cm     LV e' lateral:   8.00 cm/s LV PW:         1.20 cm     LV E/e' lateral: 16.6 LV IVS:        1.00 cm     LV e' medial:    10.00 cm/s LVOT diam:     1.90 cm     LV E/e' medial:  13.3 LV SV:  61 LV SV Index:   34 LVOT Area:     2.84 cm  LV Volumes (MOD) LV vol d, MOD A2C: 81.7 ml LV vol d, MOD A4C: 86.2 ml LV vol s, MOD A2C: 29.1 ml LV vol s, MOD A4C: 21.1 ml LV SV MOD A2C:     52.6 ml LV SV MOD A4C:     86.2 ml LV SV MOD BP:      60.5 ml RIGHT VENTRICLE         IVC TAPSE (M-mode): 2.1 cm  IVC diam: 2.40 cm LEFT ATRIUM             Index       RIGHT ATRIUM           Index LA diam:        3.90 cm 2.19 cm/m  RA Area:     18.80 cm LA Vol (A2C):   63.9 ml 35.86 ml/m RA Volume:   49.20 ml  27.61 ml/m LA Vol (A4C):   51.9 ml 29.13 ml/m LA Biplane Vol: 57.8 ml 32.44 ml/m  AORTIC VALVE AV Area (Vmax):    0.84 cm AV Area (Vmean):   0.75 cm AV Area (VTI):     0.75 cm AV Vmax:           357.80 cm/s AV Vmean:          261.800 cm/s AV VTI:            0.809 m AV Peak Grad:      51.2 mmHg AV Mean Grad:      30.8 mmHg LVOT Vmax:         106.00 cm/s LVOT Vmean:        69.100 cm/s LVOT VTI:          0.214 m LVOT/AV VTI  ratio: 0.26  AORTA Ao Root diam: 3.40 cm MITRAL VALVE MV Area (PHT): 3.37 cm     SHUNTS MV Decel Time: 225 msec     Systemic VTI:  0.21 m MV E velocity: 133.00 cm/s  Systemic Diam: 1.90 cm MV A velocity: 88.80 cm/s MV E/A ratio:  1.50 Buford Dresser MD Electronically signed by Buford Dresser MD Signature Date/Time: 08/04/2019/8:33:31 PM    Final       Scheduled Meds: . vitamin C  500 mg Oral Daily  . aspirin  81 mg Oral Daily  . atorvastatin  20 mg Oral Daily  . timolol  1 drop Left Eye BID   And  . brimonidine  1 drop Left Eye BID  . cholecalciferol  2,000 Units Oral Daily  . dorzolamide-timolol  1 drop Left Eye BID  . doxycycline  100 mg Oral Q12H  . febuxostat  40 mg Oral Once per day on Sun Tue Fri  . finasteride  5 mg Oral Daily  . furosemide  20 mg Oral Daily  . hydrALAZINE  50 mg Oral Q8H  . insulin aspart  0-5 Units Subcutaneous QHS  . insulin aspart  0-9 Units Subcutaneous TID WC  . latanoprost  1 drop Both Eyes QHS  . metoprolol tartrate  12.5 mg Oral BID  . multivitamin  1 tablet Oral BID  . sodium bicarbonate  1,300 mg Oral BID  . triamcinolone cream   Topical BID   Continuous Infusions: . cefTRIAXone (ROCEPHIN)  IV 2 g (08/04/19 2137)  . heparin 1,100 Units/hr (08/04/19 2129)     LOS: 3 days      Time spent:  35 minutes   Dessa Phi, DO [facility] 08/05/2019, 2:08 PM   Available via Epic secure chat 7am-7pm After these hours, please refer to coverage provider listed on [email or web address]

## 2019-08-06 ENCOUNTER — Inpatient Hospital Stay (HOSPITAL_COMMUNITY): Payer: Medicare Other

## 2019-08-06 DIAGNOSIS — I4891 Unspecified atrial fibrillation: Secondary | ICD-10-CM | POA: Diagnosis not present

## 2019-08-06 DIAGNOSIS — I10 Essential (primary) hypertension: Secondary | ICD-10-CM

## 2019-08-06 DIAGNOSIS — J189 Pneumonia, unspecified organism: Secondary | ICD-10-CM | POA: Diagnosis not present

## 2019-08-06 LAB — CBC
HCT: 31.3 % — ABNORMAL LOW (ref 39.0–52.0)
Hemoglobin: 10 g/dL — ABNORMAL LOW (ref 13.0–17.0)
MCH: 30.1 pg (ref 26.0–34.0)
MCHC: 31.9 g/dL (ref 30.0–36.0)
MCV: 94.3 fL (ref 80.0–100.0)
Platelets: 205 10*3/uL (ref 150–400)
RBC: 3.32 MIL/uL — ABNORMAL LOW (ref 4.22–5.81)
RDW: 13.9 % (ref 11.5–15.5)
WBC: 10 10*3/uL (ref 4.0–10.5)
nRBC: 0 % (ref 0.0–0.2)

## 2019-08-06 LAB — IMMUNOFIXATION, URINE

## 2019-08-06 LAB — PROTEIN ELECTROPHORESIS, SERUM
A/G Ratio: 1.2 (ref 0.7–1.7)
Albumin ELP: 3 g/dL (ref 2.9–4.4)
Alpha-1-Globulin: 0.4 g/dL (ref 0.0–0.4)
Alpha-2-Globulin: 0.9 g/dL (ref 0.4–1.0)
Beta Globulin: 0.7 g/dL (ref 0.7–1.3)
Gamma Globulin: 0.5 g/dL (ref 0.4–1.8)
Globulin, Total: 2.5 g/dL (ref 2.2–3.9)
Total Protein ELP: 5.5 g/dL — ABNORMAL LOW (ref 6.0–8.5)

## 2019-08-06 LAB — KAPPA/LAMBDA LIGHT CHAINS
Kappa free light chain: 53.4 mg/L — ABNORMAL HIGH (ref 3.3–19.4)
Kappa, lambda light chain ratio: 1.64 (ref 0.26–1.65)
Lambda free light chains: 32.5 mg/L — ABNORMAL HIGH (ref 5.7–26.3)

## 2019-08-06 LAB — HEPARIN LEVEL (UNFRACTIONATED): Heparin Unfractionated: 0.26 IU/mL — ABNORMAL LOW (ref 0.30–0.70)

## 2019-08-06 LAB — GLUCOSE, CAPILLARY
Glucose-Capillary: 109 mg/dL — ABNORMAL HIGH (ref 70–99)
Glucose-Capillary: 155 mg/dL — ABNORMAL HIGH (ref 70–99)
Glucose-Capillary: 120 mg/dL — ABNORMAL HIGH (ref 70–99)
Glucose-Capillary: 159 mg/dL — ABNORMAL HIGH (ref 70–99)

## 2019-08-06 LAB — BASIC METABOLIC PANEL
Anion gap: 7 (ref 5–15)
BUN: 34 mg/dL — ABNORMAL HIGH (ref 8–23)
CO2: 19 mmol/L — ABNORMAL LOW (ref 22–32)
Calcium: 8.1 mg/dL — ABNORMAL LOW (ref 8.9–10.3)
Chloride: 109 mmol/L (ref 98–111)
Creatinine, Ser: 2.44 mg/dL — ABNORMAL HIGH (ref 0.61–1.24)
GFR calc Af Amer: 27 mL/min — ABNORMAL LOW (ref >60)
GFR calc non Af Amer: 23 mL/min — ABNORMAL LOW (ref >60)
Glucose, Bld: 123 mg/dL — ABNORMAL HIGH (ref 70–99)
Potassium: 4.1 mmol/L (ref 3.5–5.1)
Sodium: 135 mmol/L (ref 135–145)

## 2019-08-06 LAB — PROCALCITONIN: Procalcitonin: 0.65 ng/mL

## 2019-08-06 MED ORDER — APIXABAN 2.5 MG PO TABS
2.5000 mg | ORAL_TABLET | Freq: Two times a day (BID) | ORAL | Status: DC
  Administered 2019-08-06 – 2019-08-09 (×7): 2.5 mg via ORAL
  Filled 2019-08-06 (×7): qty 1

## 2019-08-06 MED ORDER — IPRATROPIUM BROMIDE 0.02 % IN SOLN: 0.5000 mg | Freq: Four times a day (QID) | RESPIRATORY_TRACT | Status: DC | PRN

## 2019-08-06 MED ORDER — HYDRALAZINE HCL 25 MG PO TABS
25.0000 mg | ORAL_TABLET | Freq: Once | ORAL | Status: AC
  Administered 2019-08-06: 25 mg via ORAL
  Filled 2019-08-06: qty 1

## 2019-08-06 MED ORDER — PANTOPRAZOLE SODIUM 40 MG PO TBEC
40.0000 mg | DELAYED_RELEASE_TABLET | Freq: Every day | ORAL | Status: DC
  Administered 2019-08-06 – 2019-08-09 (×5): 40 mg via ORAL
  Filled 2019-08-06 (×5): qty 1

## 2019-08-06 MED ORDER — FUROSEMIDE 10 MG/ML IJ SOLN
40.0000 mg | Freq: Two times a day (BID) | INTRAMUSCULAR | Status: AC
  Administered 2019-08-06 (×2): 40 mg via INTRAVENOUS
  Filled 2019-08-06 (×2): qty 4

## 2019-08-06 MED ORDER — LEVALBUTEROL HCL 0.63 MG/3ML IN NEBU
0.6300 mg | INHALATION_SOLUTION | Freq: Four times a day (QID) | RESPIRATORY_TRACT | Status: DC | PRN
  Administered 2019-08-06 – 2019-08-07 (×4): 0.63 mg via RESPIRATORY_TRACT
  Filled 2019-08-06 (×4): qty 3

## 2019-08-06 MED ORDER — METOPROLOL TARTRATE 5 MG/5ML IV SOLN
5.0000 mg | INTRAVENOUS | Status: DC | PRN
  Administered 2019-08-06: 5 mg via INTRAVENOUS
  Filled 2019-08-06 (×3): qty 5

## 2019-08-06 MED ORDER — HYDRALAZINE HCL 50 MG PO TABS
75.0000 mg | ORAL_TABLET | Freq: Three times a day (TID) | ORAL | Status: DC
  Administered 2019-08-06 – 2019-08-07 (×3): 75 mg via ORAL
  Filled 2019-08-06 (×3): qty 1

## 2019-08-06 NOTE — Progress Notes (Signed)
Physical Therapy Treatment Patient Details Name: Adam [name] MRN: [id number] DOB: [birth year] Today's Date: 08/06/2019    History of Present Illness 86 y.o. male with a hx of hypertension, diet-controlled diabetes mellitus, GERD, BPH, chronic kidney disease IV, and gait imbalance admitted to ED on 5/2 from [place] with multifocal PNA, and developed afib with RVR. Cardiology following.    PT Comments    Pt with increased work of breathing this day at rest, but sats WFL. PT and RN trialled room air with pt during gait training, with SpO2 maintained 91% and greater on RA. Pt with increased gait distance this day with min guard for safety and cuing for proper form provided. PT discussed d/c plan with both pt and pt's daughter, and pt's daughter present states that pt's other daughter will be staying with pt 24/7 to assist him with mobility and ADLs as needed. Therefore, PT updated plan to recommend HHPT with 24/7. Pt with preference to d/c to home apt. PT to continue to follow and progress mobility as tolerated.    Follow Up Recommendations  Home health PT;Supervision/Assistance - 24 hour    Equipment Recommendations  None recommended by PT    Recommendations for Other Services       Precautions / Restrictions Precautions Precautions: Fall Restrictions Weight Bearing Restrictions: No    Mobility  Bed Mobility               General bed mobility comments: pt received in chair  Transfers Overall transfer level: Needs assistance Equipment used: Rolling walker (2 wheeled) Transfers: Sit to/from Stand Sit to Stand: Min guard         General transfer comment: min guard for safety, no verbal cuing required for stand  Ambulation/Gait Ambulation/Gait assistance: Min guard Gait Distance (Feet): 160 Feet Assistive device: Rolling walker (2 wheeled) Gait Pattern/deviations: Step-through pattern;Decreased stride length;Trunk flexed Gait velocity: decr   General Gait Details:  Min guard for safety, verbal cuing for placement in RW and upright posture. Room air trialled, with min SpO2 of 91%, DOE 3/4. Proper breathing technique encouraged.   Stairs             Wheelchair Mobility    Modified Rankin (Stroke Patients Only)       Balance Overall balance assessment: Needs assistance   Sitting balance-Leahy Scale: Good       Standing balance-Leahy Scale: Poor Standing balance comment: reliant on ext support of RW                            Cognition Arousal/Alertness: Awake/alert Behavior During Therapy: WFL for tasks assessed/performed Overall Cognitive Status: Within Functional Limits for tasks assessed                                        Exercises      General Comments General comments (skin integrity, edema, etc.): SpO2 91% and greater on RA      Pertinent Vitals/Pain Pain Assessment: No/denies pain Pain Intervention(s): Monitored during session    Home Living                      Prior Function            PT Goals (current goals can now be found in the care plan section) Acute Rehab PT Goals Patient Stated Goal:  go back to ILF, return to independence PT Goal Formulation: With patient Time For Goal Achievement: 08/19/19 Potential to Achieve Goals: Good Progress towards PT goals: Progressing toward goals    Frequency    Min 3X/week(hopeful to d/c home, frequency to reflect this)      PT Plan Discharge plan needs to be updated    Co-evaluation              AM-PAC PT "6 Clicks" Mobility   Outcome Measure  Help needed turning from your back to your side while in a flat bed without using bedrails?: A Little Help needed moving from lying on your back to sitting on the side of a flat bed without using bedrails?: A Little Help needed moving to and from a bed to a chair (including a wheelchair)?: A Little Help needed standing up from a chair using your arms (e.g., wheelchair or  bedside chair)?: A Little Help needed to walk in hospital room?: A Little Help needed climbing 3-5 steps with a railing? : A Lot 6 Click Score: 17    End of Session   Activity Tolerance: Patient limited by fatigue Patient left: in chair;with call bell/phone within reach;with family/visitor present Nurse Communication: Mobility status PT Visit Diagnosis: Other abnormalities of gait and mobility (R26.89);Muscle weakness (generalized) (M62.81)     Time: 1610-9604 PT Time Calculation (min) (ACUTE ONLY): 20 min  Charges:  $Gait Training: 8-22 mins                    Norvel Wenker E, PT [place] Pager [phone]  Office [place] 08/06/2019, 2:49 PM

## 2019-08-06 NOTE — Progress Notes (Addendum)
[facility] Daily Progress Note  Indications for Consult: AKI  S: Patient seen resting in bed experiencing some shortness of breath with audible wheeze, nasal cannula in place, just finished receiving his breathing treatment.  Reports his ankle has gotten better, no other concerns at this time.  O:BP (!) 155/69   Pulse 75   Temp 98.2 F (36.8 C)   Resp 18   Ht 5\' 4"  (1.626 m)   Wt 72.8 kg   SpO2 95%   BMI 27.55 kg/m   Intake/Output Summary (Last 24 hours) at 08/06/2019 0851 Last data filed at 08/05/2019 2118 Gross per 24 hour  Intake 363.97 ml  Output 550 ml  Net -186.03 ml   Intake/Output: I/O last 3 completed shifts: In: 1099.1 [P.O.:120; I.V.:437.1; IV Piggyback:542] Out: 950 [Urine:950]  Intake/Output this shift:  No intake/output data recorded. Weight change:   Physical exam: Gen: Pleasant patient, nontoxic-appearing CVS: Currently regular rhythm, rate 80s, grade 2/6 systolic murmur appreciated, clear S2 Resp: Upon auscultation CTA bilaterally, patient with increased work of breathing however speaking in complete sentences Abd: Soft, nontender, normal bowel sounds appreciated Ext: Trace edema to bilateral lower extremities after Lasix  Recent Labs  Lab 08/02/19 1623 08/03/19 0326 08/04/19 0342 08/05/19 0359 08/06/19 0313  NA 138 137 140 140 135  K 4.5 5.5* 4.3 4.6 4.1  CL 106 105 108 107 109  CO2 21* 21* 24 21* 19*  GLUCOSE 121* 112* 122* 117* 123*  BUN 32* 33* 33* 29* 34*  CREATININE 2.45* 2.60* 2.59* 2.46* 2.44*  ALBUMIN 3.8 3.4*  --   --   --   CALCIUM 8.9 8.6* 8.3* 8.6* 8.1*  AST 14* 20  --   --   --   ALT 12 10  --   --   --    Liver Function Tests: Recent Labs  Lab 08/02/19 1623 08/03/19 0326  AST 14* 20  ALT 12 10  ALKPHOS 57 50  BILITOT 0.7 1.5*  PROT 6.4* 5.8*  ALBUMIN 3.8 3.4*   Recent Labs  Lab 08/02/19 1623  LIPASE 36   Recent Labs  Lab 08/05/19 0359  AMMONIA 17   CBC: Recent Labs  Lab 08/02/19 1623  08/03/19 0448  WBC 11.7* 10.2  NEUTROABS  --  6.5  HGB 10.6* 10.1*  HCT 33.1* 32.5*  MCV 94.8 96.4  PLT 191 172   Cardiac Enzymes: No results for input(s): CKTOTAL, CKMB, CKMBINDEX, TROPONINI in the last 168 hours. CBG: Recent Labs  Lab 08/05/19 0742 08/05/19 1314 08/05/19 1631 08/05/19 2104 08/06/19 0733  GLUCAP 110* 158* 109* 121* 109*    Iron Studies: No results for input(s): IRON, TIBC, TRANSFERRIN, FERRITIN in the last 72 hours. Studies/Results: US RENAL  Result Date: 08/04/2019 CLINICAL DATA:  86 year old male with acute renal insufficiency. EXAM: RENAL / URINARY TRACT ULTRASOUND COMPLETE COMPARISON:  None. FINDINGS: Right Kidney: Renal measurements: 9.9 x 5.0 x 4.2 cm = volume: 109 mL. There is moderate parenchyma atrophy and cortical thinning. Mild increased echogenicity. There is a 2 cm interpolar cyst. No hydronephrosis or shadowing stone. Left Kidney: Renal measurements: 8.5 x 5.1 x 3.9 cm = volume: 87 mL. Moderate parenchyma atrophy and cortical thinning. Mild increased echogenicity. No hydronephrosis or shadowing stone. Bladder: Appears normal for degree of bladder distention. Other: None. IMPRESSION: 1. No hydronephrosis or shadowing stone. 2. Increased renal parenchymal echogenicity and parenchymal atrophy in keeping with chronic kidney disease. Electronically Signed   By: Laren Everts.D.  On: 08/04/2019 19:11   DG CHEST PORT 1 VIEW  Result Date: 08/06/2019 CLINICAL DATA:  Shortness of breath EXAM: PORTABLE CHEST 1 VIEW COMPARISON:  CT 08/02/2019 FINDINGS: Asymmetrically increased opacity in the right hemithorax likely reflecting layering effusion on this portable supine film. Small left effusion noted as well. Patchy areas of consolidation throughout the lungs, increased from most recent comparison. No visible pneumothorax. Cardiomediastinal contours are stable with a calcified aorta. Telemetry leads overlie the chest. No acute osseous or soft tissue abnormality.  Degenerative changes are present in the imaged spine and shoulders. IMPRESSION: 1. Bilateral pleural effusions, right greater than left. 2. Patchy areas of consolidation throughout the lungs, increased from most recent comparison, likely acute infection or inflammation. Electronically Signed   By: Lovena Le M.D.   On: 08/06/2019 02:14   DG Foot Complete Right  Result Date: 08/04/2019 CLINICAL DATA:  Right foot pain with swelling EXAM: RIGHT FOOT COMPLETE - 3+ VIEW COMPARISON:  12/25/2009 FINDINGS: No fracture or malalignment. Vascular calcifications. Degenerative changes at the first MTP joint. Arthritis at the second through fifth IP joints. Moderate plantar calcaneal spur. IMPRESSION: 1. No acute osseous abnormality 2. Arthritis at the first MTP joint and IP joints. Electronically Signed   By: Donavan Foil M.D.   On: 08/04/2019 18:39   ECHOCARDIOGRAM COMPLETE  Result Date: 08/04/2019    ECHOCARDIOGRAM REPORT   Patient Name:   Adam [name] Date of Exam: 08/04/2019 Medical Rec #:  [id number]   Height:       64.0 in Accession #:    [id number]  Weight:       160.5 lb Date of Birth:  October 20, 1930    BSA:          1.782 m Patient Age:    62 years    BP:           157/81 mmHg Patient Gender: M           HR:           84 bpm. Exam Location:  Inpatient Procedure: 2D Echo, Cardiac Doppler and Color Doppler Indications:    I48.91* Unspeicified atrial fibrillation  History:        Patient has prior history of Echocardiogram examinations, most                 recent 05/01/2018. Abnormal ECG, Aortic Valve Disease,                 Arrythmias:PAC, Signs/Symptoms:Altered Mental Status, Murmur and                 Hypotension; Risk Factors:Hypertension, Dyslipidemia and                 Diabetes. Hypoxia.  Sonographer:    Roseanna Rainbow RDCS Referring Phys: [id number] SEYED A SHAHMEHDI  Sonographer Comments: Technically difficult study due to poor echo windows and suboptimal parasternal window. IMPRESSIONS  1. Left ventricular ejection  fraction, by estimation, is 60 to 65%. The left ventricle has normal function. The left ventricle has no regional wall motion abnormalities. Left ventricular diastolic parameters are consistent with Grade II diastolic dysfunction (pseudonormalization).  2. Right ventricular systolic function is normal. The right ventricular size is normal.  3. Left atrial size was mild to moderately dilated.  4. Right atrial size was mildly dilated.  5. The mitral valve is grossly normal. Mild mitral valve regurgitation. No evidence of mitral stenosis.  6. The aortic valve is abnormal. Aortic valve regurgitation is  trivial. Moderate aortic valve stenosis.  7. The inferior vena cava is dilated in size with >50% respiratory variability, suggesting right atrial pressure of 8 mmHg. Conclusion(s)/Recommendation(s): Frequent ectopic beats throughout study. FINDINGS  Left Ventricle: Left ventricular ejection fraction, by estimation, is 60 to 65%. The left ventricle has normal function. The left ventricle has no regional wall motion abnormalities. The left ventricular internal cavity size was normal in size. There is  no left ventricular hypertrophy. Left ventricular diastolic parameters are consistent with Grade II diastolic dysfunction (pseudonormalization). Right Ventricle: The right ventricular size is normal. No increase in right ventricular wall thickness. Right ventricular systolic function is normal. Left Atrium: Left atrial size was mild to moderately dilated. Right Atrium: Right atrial size was mildly dilated. Pericardium: There is no evidence of pericardial effusion. Mitral Valve: The mitral valve is grossly normal. Mild mitral valve regurgitation. No evidence of mitral valve stenosis. Tricuspid Valve: The tricuspid valve is normal in structure. Tricuspid valve regurgitation is trivial. No evidence of tricuspid stenosis. Aortic Valve: The aortic valve is abnormal. . There is severe thickening and moderate calcification of the  aortic valve. Aortic valve regurgitation is trivial. Moderate aortic stenosis is present. There is severe thickening of the aortic valve. There is moderate calcification of the aortic valve. Aortic valve mean gradient measures 30.8 mmHg. Aortic valve peak gradient measures 51.2 mmHg. Aortic valve area, by VTI measures 0.75 cm. Pulmonic Valve: The pulmonic valve was not well visualized. Pulmonic valve regurgitation is not visualized. No evidence of pulmonic stenosis. Aorta: The aortic root and ascending aorta are structurally normal, with no evidence of dilitation. Pulmonary Artery: The pulmonary artery is not well seen. Venous: The inferior vena cava is dilated in size with greater than 50% respiratory variability, suggesting right atrial pressure of 8 mmHg. IAS/Shunts: No atrial level shunt detected by color flow Doppler.  LEFT VENTRICLE PLAX 2D LVIDd:         4.50 cm     Diastology LVIDs:         3.00 cm     LV e' lateral:   8.00 cm/s LV PW:         1.20 cm     LV E/e' lateral: 16.6 LV IVS:        1.00 cm     LV e' medial:    10.00 cm/s LVOT diam:     1.90 cm     LV E/e' medial:  13.3 LV SV:         61 LV SV Index:   34 LVOT Area:     2.84 cm  LV Volumes (MOD) LV vol d, MOD A2C: 81.7 ml LV vol d, MOD A4C: 86.2 ml LV vol s, MOD A2C: 29.1 ml LV vol s, MOD A4C: 21.1 ml LV SV MOD A2C:     52.6 ml LV SV MOD A4C:     86.2 ml LV SV MOD BP:      60.5 ml RIGHT VENTRICLE         IVC TAPSE (M-mode): 2.1 cm  IVC diam: 2.40 cm LEFT ATRIUM             Index       RIGHT ATRIUM           Index LA diam:        3.90 cm 2.19 cm/m  RA Area:     18.80 cm LA Vol (A2C):   63.9 ml 35.86 ml/m RA Volume:   49.20 ml  27.61 ml/m LA Vol (A4C):   51.9 ml 29.13 ml/m LA Biplane Vol: 57.8 ml 32.44 ml/m  AORTIC VALVE AV Area (Vmax):    0.84 cm AV Area (Vmean):   0.75 cm AV Area (VTI):     0.75 cm AV Vmax:           357.80 cm/s AV Vmean:          261.800 cm/s AV VTI:            0.809 m AV Peak Grad:      51.2 mmHg AV Mean Grad:      30.8  mmHg LVOT Vmax:         106.00 cm/s LVOT Vmean:        69.100 cm/s LVOT VTI:          0.214 m LVOT/AV VTI ratio: 0.26  AORTA Ao Root diam: 3.40 cm MITRAL VALVE MV Area (PHT): 3.37 cm     SHUNTS MV Decel Time: 225 msec     Systemic VTI:  0.21 m MV E velocity: 133.00 cm/s  Systemic Diam: 1.90 cm MV A velocity: 88.80 cm/s MV E/A ratio:  1.50 Buford Dresser MD Electronically signed by Buford Dresser MD Signature Date/Time: 08/04/2019/8:33:31 PM    Final    . vitamin C  500 mg Oral Daily  . aspirin  81 mg Oral Daily  . atorvastatin  20 mg Oral Daily  . timolol  1 drop Left Eye BID   And  . brimonidine  1 drop Left Eye BID  . cholecalciferol  2,000 Units Oral Daily  . dorzolamide-timolol  1 drop Left Eye BID  . doxycycline  100 mg Oral Q12H  . febuxostat  40 mg Oral Once per day on Sun Tue Fri  . finasteride  5 mg Oral Daily  . furosemide  20 mg Oral Daily  . hydrALAZINE  50 mg Oral Q8H  . insulin aspart  0-5 Units Subcutaneous QHS  . insulin aspart  0-9 Units Subcutaneous TID WC  . latanoprost  1 drop Both Eyes QHS  . metoprolol tartrate  12.5 mg Oral BID  . multivitamin  1 tablet Oral BID  . pantoprazole  40 mg Oral Daily  . sodium bicarbonate  1,300 mg Oral BID  . triamcinolone cream   Topical BID    BMET    Component Value Date/Time   NA 135 08/06/2019 0313   NA 138 02/01/2012 0920   K 4.1 08/06/2019 0313   K 4.6 02/01/2012 0920   CL 109 08/06/2019 0313   CL 113 (H) 02/01/2012 0920   CO2 19 (L) 08/06/2019 0313   CO2 21 (L) 02/01/2012 0920   GLUCOSE 123 (H) 08/06/2019 0313   GLUCOSE 130 (H) 02/01/2012 0920   BUN 34 (H) 08/06/2019 0313   BUN 28.0 (H) 02/01/2012 0920   CREATININE 2.44 (H) 08/06/2019 0313   CREATININE 1.64 (H) 12/15/2015 1025   CREATININE 1.4 (H) 02/01/2012 0920   CALCIUM 8.1 (L) 08/06/2019 0313   CALCIUM 9.5 02/01/2012 0920   GFRNONAA 23 (L) 08/06/2019 0313   GFRAA 27 (L) 08/06/2019 0313   CBC    Component Value Date/Time   WBC 10.2  08/03/2019 0448   RBC 3.37 (L) 08/03/2019 0448   HGB 10.1 (L) 08/03/2019 0448   HGB 9.8 (L) 02/01/2012 0920   HCT 32.5 (L) 08/03/2019 0448   HCT 28.0 (L) 02/01/2012 0920   PLT 172 08/03/2019 0448   PLT 160 02/01/2012 0920  MCV 96.4 08/03/2019 0448   MCV 102.0 (H) 02/01/2012 0920   MCH 30.0 08/03/2019 0448   MCHC 31.1 08/03/2019 0448   RDW 14.4 08/03/2019 0448   RDW 14.4 02/01/2012 0920   LYMPHSABS 1.7 08/03/2019 0448   LYMPHSABS 0.9 02/01/2012 0920   MONOABS 1.7 (H) 08/03/2019 0448   MONOABS 0.4 02/01/2012 0920   EOSABS 0.1 08/03/2019 0448   EOSABS 0.2 02/01/2012 0920   BASOSABS 0.1 08/03/2019 0448   BASOSABS 0.1 02/01/2012 0920    Assessment/plan Multifocal pneumonia, improving: Patient with multifocal pneumonia as seen on CT chest on admission. Patient on CTX5/2and azithromycin 5/3.  -Continue on IV azithromycin (5/3-) 500 mg every 24 hours -Continue IVCTX(5/2-) 2 g every 24 hours  AKI onChronic kidney diseaseIIIb, slightly improved:Creatinine has ranged from 1.7-2 over the past year, on admission patient creatinine 2.6>>2.44 today 5/6, after receiving Lasix 20mg  PO. FENa 1.4% suggesting Intrinsic Etiology (ATN, AIN, Glomerulonephritides), UA with 100 protein, otherwise unremarkable. Likely has had progressive CKD due to hypertensive nephrosclerosis. -Continue to monitor -Strict I&Os -Holding IV fluids due to increased lower extremity swelling, BNP 600 >>1400 -Cardiology increased patient's Lasix up to 40 mg IV twice daily, follow-up serum creatinine -SPEP/UPEP, Light chains pending (pt w/ Hx of MGUS) -he has never seen a nephrologist before, will require outpatient follow up after discharge.  -no urgent indication for dialysis at this time as his Scr is not much off his baseline.  HTN, stable: BP this AM 165/71. Blood pressure treated at home with amlodipine 5 mg daily, clonidine 0.1 mg twice daily, furosemide 20 mg daily, hydralazine 25 mg 3 times daily, labetalol  100 mg twice daily. -Currently treated with 75 mg hydralazine 3 times daily, with additional 50 mg every 8 hours as needed for SBP > 180. -ContinueMetoprolol tartrate 12.5 mg twice dailyand increase as tolerated/needed. -Continue to monitor blood pressure on Lasix  Lower extremity swelling, improved, stable: Patient has history of lower extremity swelling for which he is prescribed Lasix 20 mg daily. BNP 600 > 1400 during admission after IV fluids. Echo 5/4 with LVEF 60-65% and Grade 2 Diastolic Dysfunction. UA showing 100 protein, otherwise wnl. -Strict I&Os -Lasix 40 mg IV twice daily per cardiology  A. fib with RVR, stable: Patient with new onset A. fib with RVR, and was controlled with diltiazem drip and heparin 11 mL/hour 5/3. Some episodes of asystole overnight. -Follow up Cardiology recommendations -Metoprolol tartrate 12.5 mg twice daily -Patienthad echo 05/04 - follow up results -Eliquis 2.5 mg twice daily  Ankle pain, improved: new onset, no history of fall or trauma. History of gout, Uric Acid wnl at 5.6. -PT/OT -Continue to monitor  Adam [name], [place], PGY-2 08/06/2019 8:54 AM   I have seen and examined this patient and agree with plan and assessment in the above note with renal recommendations/intervention highlighted.  Pt with audible wheezing this morning and apparently started yesterday evening.  In good spirits.  BUN/Cr stable.  Given IV lasix today.  Continue to follow UOP and Scr.  His T. Bili was rising on 08/03/19 and should be rechecked tomorrow just for completeness sake.  Broadus Myer A Brylynn Hanssen,MD 08/06/2019 11:56 AM

## 2019-08-06 NOTE — Progress Notes (Signed)
CPAP initiated per conversation with Dr. Myna Hidalgo.  Patient appears much more comfortable.  No respiratory distress noted.  Upper airway wheezing NO LONGER heard.

## 2019-08-06 NOTE — Progress Notes (Signed)
PROGRESS NOTE    Adam [name]  LKG:[id number] DOB: December 31, 1930 DOA: 08/02/2019 PCP: Javier Glazier, MD     Brief Narrative:  Adam [name] is an 86 year old male with past medical history significant for hyperlipidemia, hypertension who presented to the hospital on 5/2 due to back pain, abdominal pain and shortness of breath.  Work-up in the emergency department revealed multifocal pneumonia and patient was admitted and started on IV antibiotics.  On 5/3, patient went into A. fib RVR and was started on IV Cardizem, IV heparin.  Cardiology was consulted at that time.  Also had postconversion pauses seen on telemetry.  Nephrology also following for acute kidney injury.  New events last 24 hours / Subjective: Worsening shortness of breath overnight with A. fib RVR.  He was placed on CPAP with improvement in his respiratory status.  States that he had some productive cough of white sputum.  This morning, he is in normal sinus rhythm, states that he is doing well overall.  Has some wheezing and awaiting for breathing treatment to arrive.  Assessment & Plan:   Principal Problem:   CAP (community acquired pneumonia) Active Problems:   HTN (hypertension)   HLD (hyperlipidemia)   Type 2 diabetes mellitus (HCC)   BPH (benign prostatic hyperplasia)   Leucocytosis   ARF (acute renal failure) (HCC)   Hypoxia   Atrial fibrillation, new onset (HCC)   Atrial fibrillation with RVR (HCC)   Multifocal pneumonia -Chest x-ray showed mild hazy infrahilar opacification right worse than left -CT chest revealed small bilateral pleural effusion, concern for multifocal pneumonia -Blood cultures negative to date -SARS-CoV-2, influenza negative -Doxycycline, Rocephin  A. fib RVR -With postconversion pauses and bradycardia -IV heparin --> Eliquis -Continue metoprolol -Appreciate cardiology, EP   Acute hypoxemic respiratory failure secondary to acute on chronic diastolic heart failure -Chest x-ray obtained  5/6, independently reviewed, increased pulmonary congestion -IV Lasix  Acute kidney injury on CKD stage IIIb -Baseline creatinine 1.7 -Renal ultrasound without hydronephrosis, increased renal parenchymal echogenicity and parenchymal atrophy consistent with chronic kidney disease -Appreciate nephrology  Elevated troponin -Flat trend, 20 --> 18 -Likely demand ischemia secondary to above  Type 2 diabetes, well controlled -Hemoglobin A1c 5.7 -Sliding scale insulin  Hypertension -Continue hydralazine, metoprolol  Hyperlipidemia -Continue Lipitor , BPH -Continue Proscar   DVT prophylaxis: Eliquis Code Status: DNR Family Communication: Daughter at bedside Disposition Plan:   Status is: Inpatient  Remains inpatient appropriate because:IV treatments appropriate due to intensity of illness or inability to take PO and Inpatient level of care appropriate due to severity of illness   Dispo: The patient is from: Independent living facility              Anticipated d/c is to: Independent living facility versus SNF              Anticipated d/c date is: 2 days              Patient currently is not medically stable to d/c.  IV Lasix today, continue to monitor BMP, respiratory status, A. fib.  Continue to treat multifocal pneumonia  Consultants:   Cardiology  Nephrology   Antimicrobials:  Anti-infectives (From admission, onward)   Start     Dose/Rate Route Frequency Ordered Stop   08/05/19 1200  doxycycline (VIBRA-TABS) tablet 100 mg     100 mg Oral Every 12 hours 08/05/19 1121     08/04/19 0000  azithromycin (ZITHROMAX) 500 mg in sodium chloride 0.9 % 250 mL IVPB  Status:  Discontinued     500 mg 250 mL/hr over 60 Minutes Intravenous Every 24 hours 08/03/19 0325 08/05/19 1121   08/03/19 2200  cefTRIAXone (ROCEPHIN) 1 g in sodium chloride 0.9 % 100 mL IVPB  Status:  Discontinued     1 g 200 mL/hr over 30 Minutes Intravenous Every 24 hours 08/03/19 0325 08/03/19 2118   08/03/19  2200  cefTRIAXone (ROCEPHIN) 2 g in sodium chloride 0.9 % 100 mL IVPB     2 g 200 mL/hr over 30 Minutes Intravenous Every 24 hours 08/03/19 2118     08/02/19 2030  cefTRIAXone (ROCEPHIN) 2 g in sodium chloride 0.9 % 100 mL IVPB  Status:  Discontinued     2 g 200 mL/hr over 30 Minutes Intravenous Every 24 hours 08/02/19 2019 08/03/19 0349   08/02/19 2030  azithromycin (ZITHROMAX) 500 mg in sodium chloride 0.9 % 250 mL IVPB  Status:  Discontinued     500 mg 250 mL/hr over 60 Minutes Intravenous Every 24 hours 08/02/19 2019 08/03/19 0350       Objective: Vitals:   08/06/19 0736 08/06/19 0738 08/06/19 0916 08/06/19 0937  BP: (!) 154/108 (!) 155/69  (!) 165/71  Pulse: 75 75 91 89  Resp: 18     Temp: 98.2 F (36.8 C)     TempSrc: Oral     SpO2: 95% 95%    Weight:      Height:        Intake/Output Summary (Last 24 hours) at 08/06/2019 1230 Last data filed at 08/06/2019 0925 Gross per 24 hour  Intake 523.18 ml  Output 350 ml  Net 173.18 ml   Filed Weights   08/03/19 0315 08/03/19 1048  Weight: 72.6 kg 72.8 kg    Examination: General exam: Appears calm and comfortable  Respiratory system: Expiratory wheezes heard, respiratory effort normal Cardiovascular system: S1 & S2 heard, RRR. No pedal edema. Gastrointestinal system: Abdomen is nondistended, soft and nontender. Normal bowel sounds heard. Central nervous system: Alert and oriented. Non focal exam. Speech clear  Extremities: Symmetric in appearance bilaterally  Skin: No rashes, lesions or ulcers on exposed skin  Psychiatry: Judgement and insight appear stable. Mood & affect appropriate.    Data Reviewed: I have personally reviewed following labs and imaging studies  CBC: Recent Labs  Lab 08/02/19 1623 08/03/19 0448 08/06/19 0954  WBC 11.7* 10.2 10.0  NEUTROABS  --  6.5  --   HGB 10.6* 10.1* 10.0*  HCT 33.1* 32.5* 31.3*  MCV 94.8 96.4 94.3  PLT 191 172 751   Basic Metabolic Panel: Recent Labs  Lab  08/02/19 1623 08/03/19 0326 08/04/19 0342 08/05/19 0359 08/06/19 0313  NA 138 137 140 140 135  K 4.5 5.5* 4.3 4.6 4.1  CL 106 105 108 107 109  CO2 21* 21* 24 21* 19*  GLUCOSE 121* 112* 122* 117* 123*  BUN 32* 33* 33* 29* 34*  CREATININE 2.45* 2.60* 2.59* 2.46* 2.44*  CALCIUM 8.9 8.6* 8.3* 8.6* 8.1*  MG  --   --  2.0 2.0  --    GFR: Estimated Creatinine Clearance: 19.5 mL/min (A) (by C-G formula based on SCr of 2.44 mg/dL (H)). Liver Function Tests: Recent Labs  Lab 08/02/19 1623 08/03/19 0326  AST 14* 20  ALT 12 10  ALKPHOS 57 50  BILITOT 0.7 1.5*  PROT 6.4* 5.8*  ALBUMIN 3.8 3.4*   Recent Labs  Lab 08/02/19 1623  LIPASE 36   Recent Labs  Lab 08/05/19  0359  AMMONIA 17   Coagulation Profile: No results for input(s): INR, PROTIME in the last 168 hours. Cardiac Enzymes: No results for input(s): CKTOTAL, CKMB, CKMBINDEX, TROPONINI in the last 168 hours. BNP (last 3 results) No results for input(s): PROBNP in the last 8760 hours. HbA1C: No results for input(s): HGBA1C in the last 72 hours. CBG: Recent Labs  Lab 08/05/19 1314 08/05/19 1631 08/05/19 2104 08/06/19 0733 08/06/19 1123  GLUCAP 158* 109* 121* 109* 155*   Lipid Profile: Recent Labs    08/05/19 0359  CHOL 106  HDL 33*  LDLCALC 56  TRIG 85  CHOLHDL 3.2   Thyroid Function Tests: Recent Labs    08/04/19 0342 08/04/19 0639  TSH 2.985  --   FREET4  --  1.28*  T3FREE  --  1.9*   Anemia Panel: Recent Labs    08/05/19 0359  VITAMINB12 501   Sepsis Labs: Recent Labs  Lab 08/04/19 0639 08/04/19 0933 08/06/19 0313  PROCALCITON 0.94  --  0.65  LATICACIDVEN 1.2 1.4  --     Recent Results (from the past 240 hour(s))  Respiratory Panel by RT PCR (Flu A&B, Covid) - Nasopharyngeal Swab     Status: None   Collection Time: 08/02/19  6:18 PM   Specimen: Nasopharyngeal Swab  Result Value Ref Range Status   SARS Coronavirus 2 by RT PCR NEGATIVE NEGATIVE Final    Comment:  (NOTE) SARS-CoV-2 target nucleic acids are NOT DETECTED. The SARS-CoV-2 RNA is generally detectable in upper respiratoy specimens during the acute phase of infection. The lowest concentration of SARS-CoV-2 viral copies this assay can detect is 131 copies/mL. A negative result does not preclude SARS-Cov-2 infection and should not be used as the sole basis for treatment or other patient management decisions. A negative result may occur with  improper specimen collection/handling, submission of specimen other than nasopharyngeal swab, presence of viral mutation(s) within the areas targeted by this assay, and inadequate number of viral copies (<131 copies/mL). A negative result must be combined with clinical observations, patient history, and epidemiological information. The expected result is Negative. Fact Sheet for Patients:  PinkCheek.be Fact Sheet for Healthcare Providers:  GravelBags.it This test is not yet ap proved or cleared by the [place] FDA and  has been authorized for detection and/or diagnosis of SARS-CoV-2 by FDA under an Emergency Use Authorization (EUA). This EUA will remain  in effect (meaning this test can be used) for the duration of the COVID-19 declaration under Section 564(b)(1) of the Act, 21 U.S.C. section 360bbb-3(b)(1), unless the authorization is terminated or revoked sooner.    Influenza A by PCR NEGATIVE NEGATIVE Final   Influenza B by PCR NEGATIVE NEGATIVE Final    Comment: (NOTE) The Xpert Xpress SARS-CoV-2/FLU/RSV assay is intended as an aid in  the diagnosis of influenza from Nasopharyngeal swab specimens and  should not be used as a sole basis for treatment. Nasal washings and  aspirates are unacceptable for Xpert Xpress SARS-CoV-2/FLU/RSV  testing. Fact Sheet for Patients: PinkCheek.be Fact Sheet for Healthcare  Providers: GravelBags.it This test is not yet approved or cleared by the [place] FDA and  has been authorized for detection and/or diagnosis of SARS-CoV-2 by  FDA under an Emergency Use Authorization (EUA). This EUA will remain  in effect (meaning this test can be used) for the duration of the  Covid-19 declaration under Section 564(b)(1) of the Act, 21  U.S.C. section 360bbb-3(b)(1), unless the authorization is  terminated or revoked. Performed  at [facility], [place] [address], [place], [place] [address]   Culture, blood (routine x 2) Call MD if unable to obtain prior to antibiotics being given     Status: None (Preliminary result)   Collection Time: 08/03/19  4:20 AM   Specimen: BLOOD  Result Value Ref Range Status   Specimen Description BLOOD RIGHT WRIST  Final   Special Requests   Final    BOTTLES DRAWN AEROBIC AND ANAEROBIC Blood Culture results may not be optimal due to an inadequate volume of blood received in culture bottles   Culture   Final    NO GROWTH 3 DAYS Performed at [facility], [place] [address] [place], [place] [address]    Report Status PENDING  Incomplete  Culture, blood (Routine X 2) w Reflex to ID Panel     Status: None (Preliminary result)   Collection Time: 08/03/19 10:43 PM   Specimen: BLOOD RIGHT ARM  Result Value Ref Range Status   Specimen Description BLOOD RIGHT ARM  Final   Special Requests   Final    BOTTLES DRAWN AEROBIC AND ANAEROBIC Blood Culture adequate volume   Culture   Final    NO GROWTH 3 DAYS Performed at [facility], [place] [address] [place], [place] [address]    Report Status PENDING  Incomplete      Radiology Studies: US RENAL  Result Date: 08/04/2019 CLINICAL DATA:  86 year old male with acute renal insufficiency. EXAM: RENAL / URINARY TRACT ULTRASOUND COMPLETE COMPARISON:  None. FINDINGS: Right Kidney: Renal measurements: 9.9 x 5.0 x 4.2 cm = volume: 109 mL. There is moderate  parenchyma atrophy and cortical thinning. Mild increased echogenicity. There is a 2 cm interpolar cyst. No hydronephrosis or shadowing stone. Left Kidney: Renal measurements: 8.5 x 5.1 x 3.9 cm = volume: 87 mL. Moderate parenchyma atrophy and cortical thinning. Mild increased echogenicity. No hydronephrosis or shadowing stone. Bladder: Appears normal for degree of bladder distention. Other: None. IMPRESSION: 1. No hydronephrosis or shadowing stone. 2. Increased renal parenchymal echogenicity and parenchymal atrophy in keeping with chronic kidney disease. Electronically Signed   By: Anner Crete M.D.   On: 08/04/2019 19:11   DG CHEST PORT 1 VIEW  Result Date: 08/06/2019 CLINICAL DATA:  Shortness of breath EXAM: PORTABLE CHEST 1 VIEW COMPARISON:  CT 08/02/2019 FINDINGS: Asymmetrically increased opacity in the right hemithorax likely reflecting layering effusion on this portable supine film. Small left effusion noted as well. Patchy areas of consolidation throughout the lungs, increased from most recent comparison. No visible pneumothorax. Cardiomediastinal contours are stable with a calcified aorta. Telemetry leads overlie the chest. No acute osseous or soft tissue abnormality. Degenerative changes are present in the imaged spine and shoulders. IMPRESSION: 1. Bilateral pleural effusions, right greater than left. 2. Patchy areas of consolidation throughout the lungs, increased from most recent comparison, likely acute infection or inflammation. Electronically Signed   By: Lovena Le M.D.   On: 08/06/2019 02:14   DG Foot Complete Right  Result Date: 08/04/2019 CLINICAL DATA:  Right foot pain with swelling EXAM: RIGHT FOOT COMPLETE - 3+ VIEW COMPARISON:  12/25/2009 FINDINGS: No fracture or malalignment. Vascular calcifications. Degenerative changes at the first MTP joint. Arthritis at the second through fifth IP joints. Moderate plantar calcaneal spur. IMPRESSION: 1. No acute osseous abnormality 2. Arthritis  at the first MTP joint and IP joints. Electronically Signed   By: Donavan Foil M.D.   On: 08/04/2019 18:39   ECHOCARDIOGRAM COMPLETE  Result Date: 08/04/2019    ECHOCARDIOGRAM REPORT   Patient Name:   Adam [name] Date of Exam: 08/04/2019 Medical Rec #:  [id number]   Height:       64.0 in Accession #:    [id number]  Weight:       160.5 lb Date of Birth:  1930/10/11    BSA:          1.782 m Patient Age:    27 years    BP:           157/81 mmHg Patient Gender: M           HR:           84 bpm. Exam Location:  Inpatient Procedure: 2D Echo, Cardiac Doppler and Color Doppler Indications:    I48.91* Unspeicified atrial fibrillation  History:        Patient has prior history of Echocardiogram examinations, most                 recent 05/01/2018. Abnormal ECG, Aortic Valve Disease,                 Arrythmias:PAC, Signs/Symptoms:Altered Mental Status, Murmur and                 Hypotension; Risk Factors:Hypertension, Dyslipidemia and                 Diabetes. Hypoxia.  Sonographer:    Roseanna Rainbow RDCS Referring Phys: [id number] SEYED A SHAHMEHDI  Sonographer Comments: Technically difficult study due to poor echo windows and suboptimal parasternal window. IMPRESSIONS  1. Left ventricular ejection fraction, by estimation, is 60 to 65%. The left ventricle has normal function. The left ventricle has no regional wall motion abnormalities. Left ventricular diastolic parameters are consistent with Grade II diastolic dysfunction (pseudonormalization).  2. Right ventricular systolic function is normal. The right ventricular size is normal.  3. Left atrial size was mild to moderately dilated.  4. Right atrial size was mildly dilated.  5. The mitral valve is grossly normal. Mild mitral valve regurgitation. No evidence of mitral stenosis.  6. The aortic valve is abnormal. Aortic valve regurgitation is trivial. Moderate aortic valve stenosis.  7. The inferior vena cava is dilated in size with >50% respiratory variability, suggesting right atrial  pressure of 8 mmHg. Conclusion(s)/Recommendation(s): Frequent ectopic beats throughout study. FINDINGS  Left Ventricle: Left ventricular ejection fraction, by estimation, is 60 to 65%. The left ventricle has normal function. The left ventricle has no regional wall motion abnormalities. The left ventricular internal cavity size was normal in size. There is  no left ventricular hypertrophy. Left ventricular diastolic parameters are consistent with Grade II diastolic dysfunction (pseudonormalization). Right Ventricle: The right ventricular size is normal. No increase in right ventricular wall thickness. Right ventricular systolic function is normal. Left Atrium: Left atrial size was mild to moderately dilated. Right Atrium: Right atrial size was mildly dilated. Pericardium: There is no evidence of pericardial effusion. Mitral Valve: The mitral valve is grossly normal. Mild mitral valve regurgitation. No evidence of mitral valve stenosis. Tricuspid Valve: The tricuspid valve is normal in structure. Tricuspid valve regurgitation is trivial. No evidence of tricuspid stenosis. Aortic Valve: The aortic valve is abnormal. . There is severe thickening and moderate calcification of the aortic valve. Aortic valve regurgitation is trivial. Moderate aortic stenosis is present. There is severe thickening of the aortic valve. There is moderate calcification of the aortic valve. Aortic valve mean gradient measures 30.8 mmHg. Aortic valve  peak gradient measures 51.2 mmHg. Aortic valve area, by VTI measures 0.75 cm. Pulmonic Valve: The pulmonic valve was not well visualized. Pulmonic valve regurgitation is not visualized. No evidence of pulmonic stenosis. Aorta: The aortic root and ascending aorta are structurally normal, with no evidence of dilitation. Pulmonary Artery: The pulmonary artery is not well seen. Venous: The inferior vena cava is dilated in size with greater than 50% respiratory variability, suggesting right atrial  pressure of 8 mmHg. IAS/Shunts: No atrial level shunt detected by color flow Doppler.  LEFT VENTRICLE PLAX 2D LVIDd:         4.50 cm     Diastology LVIDs:         3.00 cm     LV e' lateral:   8.00 cm/s LV PW:         1.20 cm     LV E/e' lateral: 16.6 LV IVS:        1.00 cm     LV e' medial:    10.00 cm/s LVOT diam:     1.90 cm     LV E/e' medial:  13.3 LV SV:         61 LV SV Index:   34 LVOT Area:     2.84 cm  LV Volumes (MOD) LV vol d, MOD A2C: 81.7 ml LV vol d, MOD A4C: 86.2 ml LV vol s, MOD A2C: 29.1 ml LV vol s, MOD A4C: 21.1 ml LV SV MOD A2C:     52.6 ml LV SV MOD A4C:     86.2 ml LV SV MOD BP:      60.5 ml RIGHT VENTRICLE         IVC TAPSE (M-mode): 2.1 cm  IVC diam: 2.40 cm LEFT ATRIUM             Index       RIGHT ATRIUM           Index LA diam:        3.90 cm 2.19 cm/m  RA Area:     18.80 cm LA Vol (A2C):   63.9 ml 35.86 ml/m RA Volume:   49.20 ml  27.61 ml/m LA Vol (A4C):   51.9 ml 29.13 ml/m LA Biplane Vol: 57.8 ml 32.44 ml/m  AORTIC VALVE AV Area (Vmax):    0.84 cm AV Area (Vmean):   0.75 cm AV Area (VTI):     0.75 cm AV Vmax:           357.80 cm/s AV Vmean:          261.800 cm/s AV VTI:            0.809 m AV Peak Grad:      51.2 mmHg AV Mean Grad:      30.8 mmHg LVOT Vmax:         106.00 cm/s LVOT Vmean:        69.100 cm/s LVOT VTI:          0.214 m LVOT/AV VTI ratio: 0.26  AORTA Ao Root diam: 3.40 cm MITRAL VALVE MV Area (PHT): 3.37 cm     SHUNTS MV Decel Time: 225 msec     Systemic VTI:  0.21 m MV E velocity: 133.00 cm/s  Systemic Diam: 1.90 cm MV A velocity: 88.80 cm/s MV E/A ratio:  1.50 Buford Dresser MD Electronically signed by Buford Dresser MD Signature Date/Time: 08/04/2019/8:33:31 PM    Final       Scheduled Meds: . apixaban  2.5 mg Oral BID  . vitamin C  500 mg Oral Daily  . aspirin  81 mg Oral Daily  . atorvastatin  20 mg Oral Daily  . timolol  1 drop Left Eye BID   And  . brimonidine  1 drop Left Eye BID  . cholecalciferol  2,000 Units Oral Daily  .  dorzolamide-timolol  1 drop Left Eye BID  . doxycycline  100 mg Oral Q12H  . febuxostat  40 mg Oral Once per day on Sun Tue Fri  . finasteride  5 mg Oral Daily  . furosemide  40 mg Intravenous BID  . hydrALAZINE  75 mg Oral Q8H  . insulin aspart  0-5 Units Subcutaneous QHS  . insulin aspart  0-9 Units Subcutaneous TID WC  . latanoprost  1 drop Both Eyes QHS  . metoprolol tartrate  12.5 mg Oral BID  . multivitamin  1 tablet Oral BID  . pantoprazole  40 mg Oral Daily  . sodium bicarbonate  1,300 mg Oral BID  . triamcinolone cream   Topical BID   Continuous Infusions: . cefTRIAXone (ROCEPHIN)  IV 2 g (08/05/19 1602)     LOS: 4 days      Time spent: 35 minutes   Dessa Phi, DO [facility] 08/06/2019, 12:30 PM   Available via Epic secure chat 7am-7pm After these hours, please refer to coverage provider listed on [email or web address]

## 2019-08-06 NOTE — Progress Notes (Addendum)
PRN Duoneb given via HHN  for SOB and resp distress.  BS are clear and slightly diminished in the left lung.  Pt has upper airway wheezing/purring.

## 2019-08-06 NOTE — Discharge Instructions (Signed)

## 2019-08-06 NOTE — Progress Notes (Signed)
Patient's resting blood pressure verified manually. RN will reassess following blood pressure medication administration. Patient otherwise stable and asymptomatic.

## 2019-08-06 NOTE — Progress Notes (Addendum)
ANTICOAGULATION CONSULT NOTE - Follow Up Consult  Pharmacy Consult for IV Heparin >> Apixaban (see addendum) Indication: atrial fibrillation (New onset)  No Known Allergies  Patient Measurements: Height: 5\' 4"  (162.6 cm) Weight: 72.8 kg (160 lb 7.9 oz) IBW/kg (Calculated) : 59.2 Heparin Dosing Weight: 72.8 kg  Vital Signs: Temp: 98.2 F (36.8 C) (05/06 0736) Temp Source: Axillary (05/06 0331) BP: 155/69 (05/06 0738) Pulse Rate: 75 (05/06 0738)  Labs: Recent Labs    08/04/19 0342 08/04/19 0933 08/04/19 1823 08/05/19 0359 08/06/19 0313  HEPARINUNFRC 0.34   < > 0.34 0.46 0.26*  CREATININE 2.59*  --   --  2.46* 2.44*   < > = values in this interval not displayed.    Estimated Creatinine Clearance: 19.5 mL/min (A) (by C-G formula based on SCr of 2.44 mg/dL (H)).   Medications:  Infusions:  . cefTRIAXone (ROCEPHIN)  IV 2 g (08/05/19 1602)  . heparin 1,100 Units/hr (08/05/19 2118)    Assessment: 5 YOM who continues on IV heparin for new onset atrial fibrillation.  Heparin level this morning is SUBtherapeutic (HL 0.26 << 0.46). No CBC obtained today - no bleeding noted per RN. Will increase the rate and recheck a HL/CBC this evening.   Goal of Therapy:  Heparin level 0.3-0.7 units/ml Monitor platelets by anticoagulation protocol: Yes   Plan:  - Increase Heparin to 1250 units/hr (12.5 ml/hr) - Will continue to monitor for any signs/symptoms of bleeding and will follow up with heparin level and CBC in 8 hours   Thank you for allowing pharmacy to be a part of this patient's care.  Alycia Rossetti, PharmD, BCPS Clinical Pharmacist Clinical phone for 08/06/2019: [phone] 08/06/2019 7:51 AM   **Pharmacist phone directory can now be found on [email or web address] ([email or web address]).  Listed under [place].   --------------------------------------------------------------------------------------- Addendum:  The plan is now to transition the patient to Apixaban. Give age>80, SCr>1.5 - the  patient qualifies for the lower dosing of 2.5 mg bid.  Plan - D/c Heparin - Start Apixaban 2.5 mg bid - Will plan to educate prior to discharge - Pharmacy will sign off of consult and continue to monitor peripherally  Alycia Rossetti, PharmD, BCPS  9:24 AM

## 2019-08-06 NOTE — Progress Notes (Addendum)
Progress Note  Patient Name: Adam [name] Date of Encounter: 08/06/2019  Primary Cardiologist: Dorris Carnes, MD   Subjective   Feels better this morning, denies active SOB, no CP, he was aware of the AFib last night  Inpatient Medications    Scheduled Meds:  apixaban  2.5 mg Oral BID   vitamin C  500 mg Oral Daily   aspirin  81 mg Oral Daily   atorvastatin  20 mg Oral Daily   timolol  1 drop Left Eye BID   And   brimonidine  1 drop Left Eye BID   cholecalciferol  2,000 Units Oral Daily   dorzolamide-timolol  1 drop Left Eye BID   doxycycline  100 mg Oral Q12H   febuxostat  40 mg Oral Once per day on Sun Tue Fri   finasteride  5 mg Oral Daily   furosemide  40 mg Intravenous BID   hydrALAZINE  25 mg Oral Once   hydrALAZINE  75 mg Oral Q8H   insulin aspart  0-5 Units Subcutaneous QHS   insulin aspart  0-9 Units Subcutaneous TID WC   latanoprost  1 drop Both Eyes QHS   metoprolol tartrate  12.5 mg Oral BID   multivitamin  1 tablet Oral BID   pantoprazole  40 mg Oral Daily   sodium bicarbonate  1,300 mg Oral BID   triamcinolone cream   Topical BID   Continuous Infusions:  cefTRIAXone (ROCEPHIN)  IV 2 g (08/05/19 1602)   PRN Meds: acetaminophen, hydrALAZINE, ipratropium, levalbuterol, metoprolol tartrate   Vital Signs    Vitals:   08/06/19 0331 08/06/19 0736 08/06/19 0738 08/06/19 0916  BP: (!) 163/72 (!) 154/108 (!) 155/69   Pulse: 77 75 75 91  Resp: 18 18    Temp: 98.9 F (37.2 C) 98.2 F (36.8 C)    TempSrc: Axillary Oral    SpO2: 96% 95% 95%   Weight:      Height:        Intake/Output Summary (Last 24 hours) at 08/06/2019 0928 Last data filed at 08/05/2019 2118 Gross per 24 hour  Intake 363.97 ml  Output 550 ml  Net -186.03 ml   Last 3 Weights 08/03/2019 08/03/2019 05/21/2018  Weight (lbs) 160 lb 7.9 oz 160 lb 149 lb  Weight (kg) 72.8 kg 72.576 kg 67.586 kg      Telemetry    AFib 130's last night, back in SR today - Personally Reviewed  ECG    None  new - Personally Reviewed  Physical Exam   GEN: No acute distress.   Neck: No JVD Cardiac: RRR, no murmurs, rubs, or gallops.  Respiratory: some crackles at the bases and end exp wheezes GI: Soft, nontender, non-distended  MS: 1-2+ edema; No deformity. Neuro:  Nonfocal  Psych: Normal affect   Labs    High Sensitivity Troponin:   Recent Labs  Lab 08/02/19 1623 08/02/19 1841  TROPONINIHS 20* 18*      Chemistry Recent Labs  Lab 08/02/19 1623 08/02/19 1623 08/03/19 0326 08/03/19 0326 08/04/19 0342 08/05/19 0359 08/06/19 0313  NA 138   < > 137   < > 140 140 135  K 4.5   < > 5.5*   < > 4.3 4.6 4.1  CL 106   < > 105   < > 108 107 109  CO2 21*   < > 21*   < > 24 21* 19*  GLUCOSE 121*   < > 112*   < >  122* 117* 123*  BUN 32*   < > 33*   < > 33* 29* 34*  CREATININE 2.45*   < > 2.60*   < > 2.59* 2.46* 2.44*  CALCIUM 8.9   < > 8.6*   < > 8.3* 8.6* 8.1*  PROT 6.4*  --  5.8*  --   --   --   --   ALBUMIN 3.8  --  3.4*  --   --   --   --   AST 14*  --  20  --   --   --   --   ALT 12  --  10  --   --   --   --   ALKPHOS 57  --  50  --   --   --   --   BILITOT 0.7  --  1.5*  --   --   --   --   GFRNONAA 23*   < > 21*   < > 21* 23* 23*  GFRAA 26*   < > 25*   < > 25* 26* 27*  ANIONGAP 11   < > 11   < > 8 12 7    < > = values in this interval not displayed.     Hematology Recent Labs  Lab 08/02/19 1623 08/03/19 0448  WBC 11.7* 10.2  RBC 3.49* 3.37*  HGB 10.6* 10.1*  HCT 33.1* 32.5*  MCV 94.8 96.4  MCH 30.4 30.0  MCHC 32.0 31.1  RDW 14.2 14.4  PLT 191 172    BNP Recent Labs  Lab 08/02/19 1623 08/04/19 0639 08/05/19 0359  BNP 608.2* 1,116.7* 1,476.0*     DDimer  Recent Labs  Lab 08/02/19 1841  DDIMER 0.68*     Radiology    US RENAL  Result Date: 08/04/2019 CLINICAL DATA:  86 year old male with acute renal insufficiency. EXAM: RENAL / URINARY TRACT ULTRASOUND COMPLETE COMPARISON:  None. FINDINGS: Right Kidney: Renal measurements: 9.9 x 5.0 x 4.2 cm =  volume: 109 mL. There is moderate parenchyma atrophy and cortical thinning. Mild increased echogenicity. There is a 2 cm interpolar cyst. No hydronephrosis or shadowing stone. Left Kidney: Renal measurements: 8.5 x 5.1 x 3.9 cm = volume: 87 mL. Moderate parenchyma atrophy and cortical thinning. Mild increased echogenicity. No hydronephrosis or shadowing stone. Bladder: Appears normal for degree of bladder distention. Other: None. IMPRESSION: 1. No hydronephrosis or shadowing stone. 2. Increased renal parenchymal echogenicity and parenchymal atrophy in keeping with chronic kidney disease. Electronically Signed   By: Anner Crete M.D.   On: 08/04/2019 19:11   DG CHEST PORT 1 VIEW  Result Date: 08/06/2019 CLINICAL DATA:  Shortness of breath EXAM: PORTABLE CHEST 1 VIEW COMPARISON:  CT 08/02/2019 FINDINGS: Asymmetrically increased opacity in the right hemithorax likely reflecting layering effusion on this portable supine film. Small left effusion noted as well. Patchy areas of consolidation throughout the lungs, increased from most recent comparison. No visible pneumothorax. Cardiomediastinal contours are stable with a calcified aorta. Telemetry leads overlie the chest. No acute osseous or soft tissue abnormality. Degenerative changes are present in the imaged spine and shoulders. IMPRESSION: 1. Bilateral pleural effusions, right greater than left. 2. Patchy areas of consolidation throughout the lungs, increased from most recent comparison, likely acute infection or inflammation. Electronically Signed   By: Lovena Le M.D.   On: 08/06/2019 02:14   DG Foot Complete Right  Result Date: 08/04/2019 CLINICAL DATA:  Right foot pain with swelling EXAM:  RIGHT FOOT COMPLETE - 3+ VIEW COMPARISON:  12/25/2009 FINDINGS: No fracture or malalignment. Vascular calcifications. Degenerative changes at the first MTP joint. Arthritis at the second through fifth IP joints. Moderate plantar calcaneal spur. IMPRESSION: 1. No acute  osseous abnormality 2. Arthritis at the first MTP joint and IP joints. Electronically Signed   By: Donavan Foil M.D.   On: 08/04/2019 18:39     Cardiac Studies   08/04/2019: TTE IMPRESSIONS   1. Left ventricular ejection fraction, by estimation, is 60 to 65%. The  left ventricle has normal function. The left ventricle has no regional  wall motion abnormalities. Left ventricular diastolic parameters are  consistent with Grade II diastolic  dysfunction (pseudonormalization).   2. Right ventricular systolic function is normal. The right ventricular  size is normal.   3. Left atrial size was mild to moderately dilated.   4. Right atrial size was mildly dilated.   5. The mitral valve is grossly normal. Mild mitral valve regurgitation.  No evidence of mitral stenosis.   6. The aortic valve is abnormal. Aortic valve regurgitation is trivial.  Moderate aortic valve stenosis.   7. The inferior vena cava is dilated in size with >50% respiratory  variability, suggesting right atrial pressure of 8 mmHg.   Patient Profile     86 y.o. male with a hx of HTN, (January had hypertensive emergency admission), DM, CKD (IV), mild AS, gait imbalance   Admitted with pneumonia, developed AFib w/RVR and post termination pauses  Assessment & Plan    1. Newly found Afib, RVR     CHA2DS2Vasc is 4 (5 with h/o diastolic dysfunction/HF)     Heparin gtt > Eliquis      2. Post conversion pauses     Asymptomatic (in  Bed)    none further with current regime     He has baseline conduction system disease and may require pacing in the future, though given acute illness will hold off and follow as he recovers from his pneumonia  3. CHF (diastolic)     CKD (IV)     Will need IV lasix 4. AS      Sounds worse on exam then echo reads      He had about 5 hours of rapid AFib last night, felt more SOB used CPAP for a few hours to feel comfortable He also appears volume OL Hopefully as pneumonia improves and  volume OL is better, will see less AFib/RVR  Dr. Lovena Le has seen and examined the patient will reach out to Dr. Oval Linsey and discuss case, will defer management of his CHF and HTN to primary cardiology team    Would plan for Zio AT monitoring at discharge   For questions or updates, please contact [facility] Please consult [email or web address] for contact info under   Signed, Baldwin Jamaica, PA-C  08/06/2019, 9:28 AM    EP Attending  Patient seen and examined. Agree with above. The patient continues to have atrial fib with a RVR. His pauses have improved. His renal function remains stage 4. His AS is moderate/severe with a mean gradient of 30. He has some volume overload. At this point we need to try and maintain NSR and his atrial fib has not improved with treatment of his pneumonia. We will start amiodarone.   Mikle Bosworth.D.

## 2019-08-06 NOTE — Progress Notes (Signed)
Progress Note  Patient Name: Adam [name] Date of Encounter: 08/06/2019  Primary Cardiologist: Dorris Carnes, MD   Subjective   Denies shortness of breath though audibly wheezing.  Denies chest pain.  Inpatient Medications    Scheduled Meds: . vitamin C  500 mg Oral Daily  . aspirin  81 mg Oral Daily  . atorvastatin  20 mg Oral Daily  . timolol  1 drop Left Eye BID   And  . brimonidine  1 drop Left Eye BID  . cholecalciferol  2,000 Units Oral Daily  . dorzolamide-timolol  1 drop Left Eye BID  . doxycycline  100 mg Oral Q12H  . febuxostat  40 mg Oral Once per day on Sun Tue Fri  . finasteride  5 mg Oral Daily  . furosemide  20 mg Oral Daily  . hydrALAZINE  50 mg Oral Q8H  . insulin aspart  0-5 Units Subcutaneous QHS  . insulin aspart  0-9 Units Subcutaneous TID WC  . latanoprost  1 drop Both Eyes QHS  . metoprolol tartrate  12.5 mg Oral BID  . multivitamin  1 tablet Oral BID  . pantoprazole  40 mg Oral Daily  . sodium bicarbonate  1,300 mg Oral BID  . triamcinolone cream   Topical BID   Continuous Infusions: . cefTRIAXone (ROCEPHIN)  IV 2 g (08/05/19 1602)  . heparin 1,100 Units/hr (08/05/19 2118)   PRN Meds: acetaminophen, hydrALAZINE, ipratropium-albuterol, metoprolol tartrate   Vital Signs    Vitals:   08/06/19 0137 08/06/19 0331 08/06/19 0736 08/06/19 0738  BP:  (!) 163/72 (!) 154/108 (!) 155/69  Pulse: (!) 123 77 75 75  Resp:  18 18   Temp:  98.9 F (37.2 C) 98.2 F (36.8 C)   TempSrc:  Axillary    SpO2: 95% 96% 95% 95%  Weight:      Height:        Intake/Output Summary (Last 24 hours) at 08/06/2019 0831 Last data filed at 08/05/2019 2118 Gross per 24 hour  Intake 363.97 ml  Output 550 ml  Net -186.03 ml   Last 3 Weights 08/03/2019 08/03/2019 05/21/2018  Weight (lbs) 160 lb 7.9 oz 160 lb 149 lb  Weight (kg) 72.8 kg 72.576 kg 67.586 kg      Telemetry    Currently sinus rhythm.  Had atrial fibrillation with rates in the 120s overnight.  Postconversion  pauses are much shorter.  - Personally Reviewed  ECG    08/04/2019: Sinus rhythm.  Rate 64 bpm.  Right bundle branch block.  LAFB.- Personally Reviewed  Physical Exam   VS:  BP (!) 155/69   Pulse 75   Temp 98.2 F (36.8 C)   Resp 18   Ht 5\' 4"  (1.626 m)   Wt 72.8 kg   SpO2 95%   BMI 27.55 kg/m  , BMI Body mass index is 27.55 kg/m. GENERAL:  Well appearing HEENT: Pupils equal round and reactive, fundi not visualized, oral mucosa unremarkable NECK:  No jugular venous distention, waveform within normal limits, carotid upstroke brisk and symmetric, no bruits LUNGS: Diffuse expiratory wheezes. HEART: Regular rate and rhythm.  PMI not displaced or sustained,S1 and S2 within normal limits, no S3, no S4, no clicks, no rubs, III/VI mid peaking systolic murmur ABD:  Flat, positive bowel sounds normal in frequency in pitch, no bruits, no rebound, no guarding, no midline pulsatile mass, no hepatomegaly, no splenomegaly EXT:  2 plus pulses throughout, 2+ LE edema to above the ankles bilaterally,  no cyanosis no clubbing SKIN:  No rashes no nodules NEURO:  Cranial nerves II through XII grossly intact, motor grossly intact throughout [facility]:  Cognitively intact, oriented to person place and time   Labs    High Sensitivity Troponin:   Recent Labs  Lab 08/02/19 1623 08/02/19 1841  TROPONINIHS 20* 18*      Chemistry Recent Labs  Lab 08/02/19 1623 08/02/19 1623 08/03/19 0326 08/03/19 0326 08/04/19 0342 08/05/19 0359 08/06/19 0313  NA 138   < > 137   < > 140 140 135  K 4.5   < > 5.5*   < > 4.3 4.6 4.1  CL 106   < > 105   < > 108 107 109  CO2 21*   < > 21*   < > 24 21* 19*  GLUCOSE 121*   < > 112*   < > 122* 117* 123*  BUN 32*   < > 33*   < > 33* 29* 34*  CREATININE 2.45*   < > 2.60*   < > 2.59* 2.46* 2.44*  CALCIUM 8.9   < > 8.6*   < > 8.3* 8.6* 8.1*  PROT 6.4*  --  5.8*  --   --   --   --   ALBUMIN 3.8  --  3.4*  --   --   --   --   AST 14*  --  20  --   --   --   --   ALT 12   --  10  --   --   --   --   ALKPHOS 57  --  50  --   --   --   --   BILITOT 0.7  --  1.5*  --   --   --   --   GFRNONAA 23*   < > 21*   < > 21* 23* 23*  GFRAA 26*   < > 25*   < > 25* 26* 27*  ANIONGAP 11   < > 11   < > 8 12 7    < > = values in this interval not displayed.     Hematology Recent Labs  Lab 08/02/19 1623 08/03/19 0448  WBC 11.7* 10.2  RBC 3.49* 3.37*  HGB 10.6* 10.1*  HCT 33.1* 32.5*  MCV 94.8 96.4  MCH 30.4 30.0  MCHC 32.0 31.1  RDW 14.2 14.4  PLT 191 172    BNP Recent Labs  Lab 08/02/19 1623 08/04/19 0639 08/05/19 0359  BNP 608.2* 1,116.7* 1,476.0*     DDimer  Recent Labs  Lab 08/02/19 1841  DDIMER 0.68*     Radiology    US RENAL  Result Date: 08/04/2019 CLINICAL DATA:  86 year old male with acute renal insufficiency. EXAM: RENAL / URINARY TRACT ULTRASOUND COMPLETE COMPARISON:  None. FINDINGS: Right Kidney: Renal measurements: 9.9 x 5.0 x 4.2 cm = volume: 109 mL. There is moderate parenchyma atrophy and cortical thinning. Mild increased echogenicity. There is a 2 cm interpolar cyst. No hydronephrosis or shadowing stone. Left Kidney: Renal measurements: 8.5 x 5.1 x 3.9 cm = volume: 87 mL. Moderate parenchyma atrophy and cortical thinning. Mild increased echogenicity. No hydronephrosis or shadowing stone. Bladder: Appears normal for degree of bladder distention. Other: None. IMPRESSION: 1. No hydronephrosis or shadowing stone. 2. Increased renal parenchymal echogenicity and parenchymal atrophy in keeping with chronic kidney disease. Electronically Signed   By: Anner Crete M.D.   On: 08/04/2019 19:11  DG CHEST PORT 1 VIEW  Result Date: 08/06/2019 CLINICAL DATA:  Shortness of breath EXAM: PORTABLE CHEST 1 VIEW COMPARISON:  CT 08/02/2019 FINDINGS: Asymmetrically increased opacity in the right hemithorax likely reflecting layering effusion on this portable supine film. Small left effusion noted as well. Patchy areas of consolidation throughout the lungs,  increased from most recent comparison. No visible pneumothorax. Cardiomediastinal contours are stable with a calcified aorta. Telemetry leads overlie the chest. No acute osseous or soft tissue abnormality. Degenerative changes are present in the imaged spine and shoulders. IMPRESSION: 1. Bilateral pleural effusions, right greater than left. 2. Patchy areas of consolidation throughout the lungs, increased from most recent comparison, likely acute infection or inflammation. Electronically Signed   By: Lovena Le M.D.   On: 08/06/2019 02:14   DG Foot Complete Right  Result Date: 08/04/2019 CLINICAL DATA:  Right foot pain with swelling EXAM: RIGHT FOOT COMPLETE - 3+ VIEW COMPARISON:  12/25/2009 FINDINGS: No fracture or malalignment. Vascular calcifications. Degenerative changes at the first MTP joint. Arthritis at the second through fifth IP joints. Moderate plantar calcaneal spur. IMPRESSION: 1. No acute osseous abnormality 2. Arthritis at the first MTP joint and IP joints. Electronically Signed   By: Donavan Foil M.D.   On: 08/04/2019 18:39   ECHOCARDIOGRAM COMPLETE  Result Date: 08/04/2019    ECHOCARDIOGRAM REPORT   Patient Name:   CAMDIN HEGNER Date of Exam: 08/04/2019 Medical Rec #:  [id number]   Height:       64.0 in Accession #:    [id number]  Weight:       160.5 lb Date of Birth:  05/08/1930    BSA:          1.782 m Patient Age:    86 years    BP:           157/81 mmHg Patient Gender: M           HR:           84 bpm. Exam Location:  Inpatient Procedure: 2D Echo, Cardiac Doppler and Color Doppler Indications:    I48.91* Unspeicified atrial fibrillation  History:        Patient has prior history of Echocardiogram examinations, most                 recent 05/01/2018. Abnormal ECG, Aortic Valve Disease,                 Arrythmias:PAC, Signs/Symptoms:Altered Mental Status, Murmur and                 Hypotension; Risk Factors:Hypertension, Dyslipidemia and                 Diabetes. Hypoxia.  Sonographer:    Roseanna Rainbow RDCS Referring Phys: [id number] SEYED A SHAHMEHDI  Sonographer Comments: Technically difficult study due to poor echo windows and suboptimal parasternal window. IMPRESSIONS  1. Left ventricular ejection fraction, by estimation, is 60 to 65%. The left ventricle has normal function. The left ventricle has no regional wall motion abnormalities. Left ventricular diastolic parameters are consistent with Grade II diastolic dysfunction (pseudonormalization).  2. Right ventricular systolic function is normal. The right ventricular size is normal.  3. Left atrial size was mild to moderately dilated.  4. Right atrial size was mildly dilated.  5. The mitral valve is grossly normal. Mild mitral valve regurgitation. No evidence of mitral stenosis.  6. The aortic valve is abnormal. Aortic valve regurgitation is trivial. Moderate aortic valve stenosis.  7. The inferior vena cava is dilated in size with >50% respiratory variability, suggesting right atrial pressure of 8 mmHg. Conclusion(s)/Recommendation(s): Frequent ectopic beats throughout study. FINDINGS  Left Ventricle: Left ventricular ejection fraction, by estimation, is 60 to 65%. The left ventricle has normal function. The left ventricle has no regional wall motion abnormalities. The left ventricular internal cavity size was normal in size. There is  no left ventricular hypertrophy. Left ventricular diastolic parameters are consistent with Grade II diastolic dysfunction (pseudonormalization). Right Ventricle: The right ventricular size is normal. No increase in right ventricular wall thickness. Right ventricular systolic function is normal. Left Atrium: Left atrial size was mild to moderately dilated. Right Atrium: Right atrial size was mildly dilated. Pericardium: There is no evidence of pericardial effusion. Mitral Valve: The mitral valve is grossly normal. Mild mitral valve regurgitation. No evidence of mitral valve stenosis. Tricuspid Valve: The tricuspid valve is  normal in structure. Tricuspid valve regurgitation is trivial. No evidence of tricuspid stenosis. Aortic Valve: The aortic valve is abnormal. . There is severe thickening and moderate calcification of the aortic valve. Aortic valve regurgitation is trivial. Moderate aortic stenosis is present. There is severe thickening of the aortic valve. There is moderate calcification of the aortic valve. Aortic valve mean gradient measures 30.8 mmHg. Aortic valve peak gradient measures 51.2 mmHg. Aortic valve area, by VTI measures 0.75 cm. Pulmonic Valve: The pulmonic valve was not well visualized. Pulmonic valve regurgitation is not visualized. No evidence of pulmonic stenosis. Aorta: The aortic root and ascending aorta are structurally normal, with no evidence of dilitation. Pulmonary Artery: The pulmonary artery is not well seen. Venous: The inferior vena cava is dilated in size with greater than 50% respiratory variability, suggesting right atrial pressure of 8 mmHg. IAS/Shunts: No atrial level shunt detected by color flow Doppler.  LEFT VENTRICLE PLAX 2D LVIDd:         4.50 cm     Diastology LVIDs:         3.00 cm     LV e' lateral:   8.00 cm/s LV PW:         1.20 cm     LV E/e' lateral: 16.6 LV IVS:        1.00 cm     LV e' medial:    10.00 cm/s LVOT diam:     1.90 cm     LV E/e' medial:  13.3 LV SV:         61 LV SV Index:   34 LVOT Area:     2.84 cm  LV Volumes (MOD) LV vol d, MOD A2C: 81.7 ml LV vol d, MOD A4C: 86.2 ml LV vol s, MOD A2C: 29.1 ml LV vol s, MOD A4C: 21.1 ml LV SV MOD A2C:     52.6 ml LV SV MOD A4C:     86.2 ml LV SV MOD BP:      60.5 ml RIGHT VENTRICLE         IVC TAPSE (M-mode): 2.1 cm  IVC diam: 2.40 cm LEFT ATRIUM             Index       RIGHT ATRIUM           Index LA diam:        3.90 cm 2.19 cm/m  RA Area:     18.80 cm LA Vol (A2C):   63.9 ml 35.86 ml/m RA Volume:   49.20 ml  27.61 ml/m LA Vol (A4C):  51.9 ml 29.13 ml/m LA Biplane Vol: 57.8 ml 32.44 ml/m  AORTIC VALVE AV Area (Vmax):     0.84 cm AV Area (Vmean):   0.75 cm AV Area (VTI):     0.75 cm AV Vmax:           357.80 cm/s AV Vmean:          261.800 cm/s AV VTI:            0.809 m AV Peak Grad:      51.2 mmHg AV Mean Grad:      30.8 mmHg LVOT Vmax:         106.00 cm/s LVOT Vmean:        69.100 cm/s LVOT VTI:          0.214 m LVOT/AV VTI ratio: 0.26  AORTA Ao Root diam: 3.40 cm MITRAL VALVE MV Area (PHT): 3.37 cm     SHUNTS MV Decel Time: 225 msec     Systemic VTI:  0.21 m MV E velocity: 133.00 cm/s  Systemic Diam: 1.90 cm MV A velocity: 88.80 cm/s MV E/A ratio:  1.50 Buford Dresser MD Electronically signed by Buford Dresser MD Signature Date/Time: 08/04/2019/8:33:31 PM    Final     Cardiac Studies   Echo 08/04/19:   1. Left ventricular ejection fraction, by estimation, is 60 to 65%. The  left ventricle has normal function. The left ventricle has no regional  wall motion abnormalities. Left ventricular diastolic parameters are  consistent with Grade II diastolic  dysfunction (pseudonormalization).  2. Right ventricular systolic function is normal. The right ventricular  size is normal.  3. Left atrial size was mild to moderately dilated.  4. Right atrial size was mildly dilated.  5. The mitral valve is grossly normal. Mild mitral valve regurgitation.  No evidence of mitral stenosis.  6. The aortic valve is abnormal. Aortic valve regurgitation is trivial.  Moderate aortic valve stenosis.  7. The inferior vena cava is dilated in size with >50% respiratory  variability, suggesting right atrial pressure of 8 mmHg.   Patient Profile     Mr. Serano is an 54M with hypertension, DM, CKD IV and mild aortic stenosis admitted with back pain and found to have pneumonia.  Since admission he also went into atrial fibrillation with RVR.  Assessment & Plan    # Paroxysmal atrial fibrillation: # Sick sinus syndrome: Mr. Carver is currently maintaining sinus rhythm.  He did have some recurrent atrial  fibrillation overnight.  Rates are poorly controlled.  He is completely asymptomatic.  His postconversion pauses are much shorter.  Continue with metoprolol for now.  We did not end up starting amiodarone yesterday and he is maintaining sinus rhythm.  Appreciate EP seeing him.  He may need a pacemaker in the long run but not at this time.  We will switch from heparin to Eliquis today.  Hopefully his rhythm will settle down as his pneumonia resolves.  # Aortic stenosis: Moderate on echo this admission.  Mean gradient 30 mmHg. Monitor as outpatient.  # Hypertension:  BP has been elevated.  We will resume his home hydralazine 75 mg 3 times daily.  Continue metoprolol.  He is unsure why amlodipine was discontinued as an outpatient.  He thinks his PCP made this change.  He does not recall having edema or other adverse side effects.  # CKD IV:  Creatinine stable.  He has some lower extremity edema.  We will try giving 2 doses IV  Lasix today and monitoring closely.  # CAP: Per primary team.  Asked for breathing treatment as he is audibly wheezing.       For questions or updates, please contact [facility] Please consult [email or web address] for contact info under        Signed, Skeet Latch, MD  08/06/2019, 8:31 AM

## 2019-08-07 DIAGNOSIS — I4891 Unspecified atrial fibrillation: Secondary | ICD-10-CM | POA: Diagnosis not present

## 2019-08-07 DIAGNOSIS — E785 Hyperlipidemia, unspecified: Secondary | ICD-10-CM | POA: Diagnosis not present

## 2019-08-07 DIAGNOSIS — N179 Acute kidney failure, unspecified: Secondary | ICD-10-CM | POA: Diagnosis not present

## 2019-08-07 DIAGNOSIS — J189 Pneumonia, unspecified organism: Secondary | ICD-10-CM | POA: Diagnosis not present

## 2019-08-07 LAB — CBC
HCT: 31.8 % — ABNORMAL LOW (ref 39.0–52.0)
Hemoglobin: 10.4 g/dL — ABNORMAL LOW (ref 13.0–17.0)
MCH: 30.4 pg (ref 26.0–34.0)
MCHC: 32.7 g/dL (ref 30.0–36.0)
MCV: 93 fL (ref 80.0–100.0)
Platelets: 217 10*3/uL (ref 150–400)
RBC: 3.42 MIL/uL — ABNORMAL LOW (ref 4.22–5.81)
RDW: 13.8 % (ref 11.5–15.5)
WBC: 8.9 10*3/uL (ref 4.0–10.5)
nRBC: 0 % (ref 0.0–0.2)

## 2019-08-07 LAB — HEPATIC FUNCTION PANEL
ALT: 21 U/L (ref 0–44)
AST: 21 U/L (ref 15–41)
Albumin: 2.8 g/dL — ABNORMAL LOW (ref 3.5–5.0)
Alkaline Phosphatase: 72 U/L (ref 38–126)
Bilirubin, Direct: <0.1 mg/dL (ref 0.0–0.2)
Total Bilirubin: 0.5 mg/dL (ref 0.3–1.2)
Total Protein: 5.9 g/dL — ABNORMAL LOW (ref 6.5–8.1)

## 2019-08-07 LAB — BASIC METABOLIC PANEL
Anion gap: 11 (ref 5–15)
BUN: 43 mg/dL — ABNORMAL HIGH (ref 8–23)
CO2: 22 mmol/L (ref 22–32)
Calcium: 8.5 mg/dL — ABNORMAL LOW (ref 8.9–10.3)
Chloride: 104 mmol/L (ref 98–111)
Creatinine, Ser: 2.52 mg/dL — ABNORMAL HIGH (ref 0.61–1.24)
GFR calc Af Amer: 26 mL/min — ABNORMAL LOW (ref >60)
GFR calc non Af Amer: 22 mL/min — ABNORMAL LOW (ref >60)
Glucose, Bld: 129 mg/dL — ABNORMAL HIGH (ref 70–99)
Potassium: 3.6 mmol/L (ref 3.5–5.1)
Sodium: 137 mmol/L (ref 135–145)

## 2019-08-07 LAB — GLUCOSE, CAPILLARY
Glucose-Capillary: 120 mg/dL — ABNORMAL HIGH (ref 70–99)
Glucose-Capillary: 279 mg/dL — ABNORMAL HIGH (ref 70–99)
Glucose-Capillary: 127 mg/dL — ABNORMAL HIGH (ref 70–99)
Glucose-Capillary: 128 mg/dL — ABNORMAL HIGH (ref 70–99)

## 2019-08-07 LAB — PROCALCITONIN: Procalcitonin: 0.47 ng/mL

## 2019-08-07 MED ORDER — FUROSEMIDE 20 MG PO TABS: 20.0000 mg | ORAL_TABLET | Freq: Two times a day (BID) | ORAL | Status: DC

## 2019-08-07 MED ORDER — AMLODIPINE BESYLATE 5 MG PO TABS
5.0000 mg | ORAL_TABLET | Freq: Every day | ORAL | Status: DC
  Administered 2019-08-07 – 2019-08-09 (×3): 5 mg via ORAL
  Filled 2019-08-07 (×3): qty 1

## 2019-08-07 MED ORDER — AMIODARONE HCL IN DEXTROSE 360-4.14 MG/200ML-% IV SOLN
30.0000 mg/h | INTRAVENOUS | Status: DC
  Administered 2019-08-07 – 2019-08-09 (×4): 30 mg/h via INTRAVENOUS
  Filled 2019-08-07 (×5): qty 200

## 2019-08-07 MED ORDER — ONDANSETRON HCL 4 MG/2ML IJ SOLN: 4.0000 mg | Freq: Four times a day (QID) | INTRAMUSCULAR | Status: DC | PRN

## 2019-08-07 MED ORDER — FUROSEMIDE 10 MG/ML IJ SOLN
40.0000 mg | Freq: Two times a day (BID) | INTRAMUSCULAR | Status: DC
  Administered 2019-08-07: 40 mg via INTRAVENOUS
  Filled 2019-08-07: qty 4

## 2019-08-07 MED ORDER — FUROSEMIDE 40 MG PO TABS
40.0000 mg | ORAL_TABLET | Freq: Two times a day (BID) | ORAL | Status: DC
  Administered 2019-08-07 – 2019-08-09 (×4): 40 mg via ORAL
  Filled 2019-08-07 (×4): qty 1

## 2019-08-07 MED ORDER — HYDRALAZINE HCL 25 MG PO TABS
25.0000 mg | ORAL_TABLET | Freq: Once | ORAL | Status: AC
  Administered 2019-08-07: 25 mg via ORAL
  Filled 2019-08-07: qty 1

## 2019-08-07 MED ORDER — AMIODARONE HCL IN DEXTROSE 360-4.14 MG/200ML-% IV SOLN
60.0000 mg/h | INTRAVENOUS | Status: AC
  Filled 2019-08-07: qty 200

## 2019-08-07 MED ORDER — AMIODARONE LOAD VIA INFUSION
150.0000 mg | Freq: Once | INTRAVENOUS | Status: AC
  Administered 2019-08-07: 60 mg via INTRAVENOUS
  Filled 2019-08-07: qty 83.34

## 2019-08-07 MED ORDER — HYDRALAZINE HCL 50 MG PO TABS
100.0000 mg | ORAL_TABLET | Freq: Three times a day (TID) | ORAL | Status: DC
  Administered 2019-08-07 – 2019-08-09 (×7): 100 mg via ORAL
  Filled 2019-08-07 (×7): qty 2

## 2019-08-07 NOTE — Plan of Care (Signed)
PATIENT IS PROGRESSING ACCORDING TO PLAN OF CARE

## 2019-08-07 NOTE — Progress Notes (Addendum)
Progress Note  Patient Name: Adam [name] Date of Encounter: 08/07/2019  Primary Cardiologist: Dorris Carnes, MD   Subjective   Feels (and looks) better this morning, OOB to chair  Inpatient Medications    Scheduled Meds:  apixaban  2.5 mg Oral BID   vitamin C  500 mg Oral Daily   aspirin  81 mg Oral Daily   atorvastatin  20 mg Oral Daily   timolol  1 drop Left Eye BID   And   brimonidine  1 drop Left Eye BID   cholecalciferol  2,000 Units Oral Daily   dorzolamide-timolol  1 drop Left Eye BID   doxycycline  100 mg Oral Q12H   febuxostat  40 mg Oral Once per day on Sun Tue Fri   finasteride  5 mg Oral Daily   hydrALAZINE  75 mg Oral Q8H   insulin aspart  0-5 Units Subcutaneous QHS   insulin aspart  0-9 Units Subcutaneous TID WC   latanoprost  1 drop Both Eyes QHS   metoprolol tartrate  12.5 mg Oral BID   multivitamin  1 tablet Oral BID   pantoprazole  40 mg Oral Daily   sodium bicarbonate  1,300 mg Oral BID   triamcinolone cream   Topical BID   Continuous Infusions:  cefTRIAXone (ROCEPHIN)  IV 2 g (08/06/19 2115)   PRN Meds: acetaminophen, hydrALAZINE, ipratropium, levalbuterol, metoprolol tartrate   Vital Signs    Vitals:   08/07/19 0157 08/07/19 0503 08/07/19 0734 08/07/19 0737  BP:  (!) 149/98 (!) 174/112   Pulse: 97 (!) 108 (!) 147 (!) 130  Resp:   19   Temp:  98.2 F (36.8 C) 97.6 F (36.4 C)   TempSrc:  Oral Oral   SpO2:  95% 98%   Weight:      Height:        Intake/Output Summary (Last 24 hours) at 08/07/2019 0816 Last data filed at 08/07/2019 0509 Gross per 24 hour  Intake 159.21 ml  Output 950 ml  Net -790.79 ml   Last 3 Weights 08/03/2019 08/03/2019 05/21/2018  Weight (lbs) 160 lb 7.9 oz 160 lb 149 lb  Weight (kg) 72.8 kg 72.576 kg 67.586 kg      Telemetry    Back in AFib 130-140's last night - Personally Reviewed  ECG    None new - Personally Reviewed  Physical Exam   GEN: No acute distress.   Neck: No JVD Cardiac: irregular and  tachycardic, no murmurs, rubs, or gallops.  Respiratory: some crackles at the bases and no wheezes GI: Soft, nontender, non-distended  MS: 2+ edema; No deformity. Neuro:  Nonfocal  Psych: Normal affect   Labs    High Sensitivity Troponin:   Recent Labs  Lab 08/02/19 1623 08/02/19 1841  TROPONINIHS 20* 18*      Chemistry Recent Labs  Lab 08/02/19 1623 08/02/19 1623 08/03/19 0326 08/04/19 0342 08/05/19 0359 08/06/19 0313 08/07/19 0333  NA 138   < > 137   < > 140 135 137  K 4.5   < > 5.5*   < > 4.6 4.1 3.6  CL 106   < > 105   < > 107 109 104  CO2 21*   < > 21*   < > 21* 19* 22  GLUCOSE 121*   < > 112*   < > 117* 123* 129*  BUN 32*   < > 33*   < > 29* 34* 43*  CREATININE 2.45*   < >  2.60*   < > 2.46* 2.44* 2.52*  CALCIUM 8.9   < > 8.6*   < > 8.6* 8.1* 8.5*  PROT 6.4*  --  5.8*  --   --   --  5.9*  ALBUMIN 3.8  --  3.4*  --   --   --  2.8*  AST 14*  --  20  --   --   --  21  ALT 12  --  10  --   --   --  21  ALKPHOS 57  --  50  --   --   --  72  BILITOT 0.7  --  1.5*  --   --   --  0.5  GFRNONAA 23*   < > 21*   < > 23* 23* 22*  GFRAA 26*   < > 25*   < > 26* 27* 26*  ANIONGAP 11   < > 11   < > 12 7 11    < > = values in this interval not displayed.     Hematology Recent Labs  Lab 08/03/19 0448 08/06/19 0954 08/07/19 0333  WBC 10.2 10.0 8.9  RBC 3.37* 3.32* 3.42*  HGB 10.1* 10.0* 10.4*  HCT 32.5* 31.3* 31.8*  MCV 96.4 94.3 93.0  MCH 30.0 30.1 30.4  MCHC 31.1 31.9 32.7  RDW 14.4 13.9 13.8  PLT 172 205 217    BNP Recent Labs  Lab 08/02/19 1623 08/04/19 0639 08/05/19 0359  BNP 608.2* 1,116.7* 1,476.0*     DDimer  Recent Labs  Lab 08/02/19 1841  DDIMER 0.68*     Radiology    US RENAL  Result Date: 08/04/2019 CLINICAL DATA:  86 year old male with acute renal insufficiency. EXAM: RENAL / URINARY TRACT ULTRASOUND COMPLETE COMPARISON:  None. FINDINGS: Right Kidney: Renal measurements: 9.9 x 5.0 x 4.2 cm = volume: 109 mL. There is moderate parenchyma  atrophy and cortical thinning. Mild increased echogenicity. There is a 2 cm interpolar cyst. No hydronephrosis or shadowing stone. Left Kidney: Renal measurements: 8.5 x 5.1 x 3.9 cm = volume: 87 mL. Moderate parenchyma atrophy and cortical thinning. Mild increased echogenicity. No hydronephrosis or shadowing stone. Bladder: Appears normal for degree of bladder distention. Other: None. IMPRESSION: 1. No hydronephrosis or shadowing stone. 2. Increased renal parenchymal echogenicity and parenchymal atrophy in keeping with chronic kidney disease. Electronically Signed   By: Anner Crete M.D.   On: 08/04/2019 19:11   DG CHEST PORT 1 VIEW  Result Date: 08/06/2019 CLINICAL DATA:  Shortness of breath EXAM: PORTABLE CHEST 1 VIEW COMPARISON:  CT 08/02/2019 FINDINGS: Asymmetrically increased opacity in the right hemithorax likely reflecting layering effusion on this portable supine film. Small left effusion noted as well. Patchy areas of consolidation throughout the lungs, increased from most recent comparison. No visible pneumothorax. Cardiomediastinal contours are stable with a calcified aorta. Telemetry leads overlie the chest. No acute osseous or soft tissue abnormality. Degenerative changes are present in the imaged spine and shoulders. IMPRESSION: 1. Bilateral pleural effusions, right greater than left. 2. Patchy areas of consolidation throughout the lungs, increased from most recent comparison, likely acute infection or inflammation. Electronically Signed   By: Lovena Le M.D.   On: 08/06/2019 02:14   DG Foot Complete Right  Result Date: 08/04/2019 CLINICAL DATA:  Right foot pain with swelling EXAM: RIGHT FOOT COMPLETE - 3+ VIEW COMPARISON:  12/25/2009 FINDINGS: No fracture or malalignment. Vascular calcifications. Degenerative changes at the first MTP joint. Arthritis at the  second through fifth IP joints. Moderate plantar calcaneal spur. IMPRESSION: 1. No acute osseous abnormality 2. Arthritis at the  first MTP joint and IP joints. Electronically Signed   By: Donavan Foil M.D.   On: 08/04/2019 18:39     Cardiac Studies   08/04/2019: TTE IMPRESSIONS   1. Left ventricular ejection fraction, by estimation, is 60 to 65%. The  left ventricle has normal function. The left ventricle has no regional  wall motion abnormalities. Left ventricular diastolic parameters are  consistent with Grade II diastolic  dysfunction (pseudonormalization).   2. Right ventricular systolic function is normal. The right ventricular  size is normal.   3. Left atrial size was mild to moderately dilated.   4. Right atrial size was mildly dilated.   5. The mitral valve is grossly normal. Mild mitral valve regurgitation.  No evidence of mitral stenosis.   6. The aortic valve is abnormal. Aortic valve regurgitation is trivial.  Moderate aortic valve stenosis.   7. The inferior vena cava is dilated in size with >50% respiratory  variability, suggesting right atrial pressure of 8 mmHg.   Patient Profile     86 y.o. male with a hx of HTN, (January had hypertensive emergency admission), DM, CKD (IV), mild AS, gait imbalance   Admitted with pneumonia, developed AFib w/RVR and post termination pauses  Assessment & Plan    1. Newly found Afib, RVR     CHA2DS2Vasc is 4 (5 with h/o diastolic dysfunction/HF)     Heparin gtt > Eliquis  Start amio gtt today      2. Post conversion pauses     Asymptomatic (in  Bed)    none further with current regime though continues to have fast AFib     He has baseline conduction system disease and may require pacing in the future, though given acute illness will hold off and follow as he recovers from his pneumonia  Hopefully with amio will not have long pauses  3. CHF (diastolic)     CKD (IV)     Fluid neg yesterday 747ml, Creat 2.53 (baseline probably about 2.0)     Still markedly overloaded      4. AS      Sounds worse on exam then echo reads  Dr. Lovena Le has seen and  examined the patient continue IV diuresis and start amio gtt  For questions or updates, please contact [facility] Please consult [email or web address] for contact info under   Signed, Baldwin [place], PA-C  08/07/2019, 8:16 AM    EP Attending  Patient seen and examined. Agree with the findings as noted above. The patient appears to be doing better with regard to his atrial fib. However, it remains present. We will start amiodarone if no improvement. I have tried to avoid out of concern for the need for PPM which would ideally be done once his pneumonia has resolved.   Mikle Bosworth.D.

## 2019-08-07 NOTE — Progress Notes (Addendum)
[facility] Daily Progress Note  Indications for Consult: AKI  S: Patient seen sitting upright in his chair with legs elevated, daughter at bedside.  Had a breathing treatment this morning. O:BP (!) 174/112 (BP Location: Right Arm)   Pulse (!) 130   Temp 97.6 F (36.4 C) (Oral)   Resp 19   Ht 5\' 4"  (1.626 m)   Wt 72.8 kg   SpO2 98%   BMI 27.55 kg/m   Intake/Output Summary (Last 24 hours) at 08/07/2019 0817 Last data filed at 08/07/2019 0509 Gross per 24 hour  Intake 159.21 ml  Output 950 ml  Net -790.79 ml   Intake/Output: I/O last 3 completed shifts: In: 159.2 [I.V.:159.2] Out: 1150 [Urine:1150]  Intake/Output this shift:  No intake/output data recorded. Weight change:   Physical exam Gen: Nontoxic appearing CVS: Irregularly irregular rhythm, heart rate 829, grade 2 systolic murmur appreciated, clear S2 Resp: CTA bilaterally, no wheezes or crackles appreciated Abd: Normal bowel sounds Ext: 2+ pitting edema appreciated to bilateral lower extremities  Recent Labs  Lab 08/02/19 1623 08/03/19 0326 08/04/19 0342 08/05/19 0359 08/06/19 0313 08/07/19 0333  NA 138 137 140 140 135 137  K 4.5 5.5* 4.3 4.6 4.1 3.6  CL 106 105 108 107 109 104  CO2 21* 21* 24 21* 19* 22  GLUCOSE 121* 112* 122* 117* 123* 129*  BUN 32* 33* 33* 29* 34* 43*  CREATININE 2.45* 2.60* 2.59* 2.46* 2.44* 2.52*  ALBUMIN 3.8 3.4*  --   --   --  2.8*  CALCIUM 8.9 8.6* 8.3* 8.6* 8.1* 8.5*  AST 14* 20  --   --   --  21  ALT 12 10  --   --   --  21   Liver Function Tests: Recent Labs  Lab 08/02/19 1623 08/03/19 0326 08/07/19 0333  AST 14* 20 21  ALT 12 10 21   ALKPHOS 57 50 72  BILITOT 0.7 1.5* 0.5  PROT 6.4* 5.8* 5.9*  ALBUMIN 3.8 3.4* 2.8*   Recent Labs  Lab 08/02/19 1623  LIPASE 36   Recent Labs  Lab 08/05/19 0359  AMMONIA 17   CBC: Recent Labs  Lab 08/02/19 1623 08/02/19 1623 08/03/19 0448 08/06/19 0954 08/07/19 0333  WBC 11.7*   < > 10.2 10.0 8.9   NEUTROABS  --   --  6.5  --   --   HGB 10.6*   < > 10.1* 10.0* 10.4*  HCT 33.1*   < > 32.5* 31.3* 31.8*  MCV 94.8  --  96.4 94.3 93.0  PLT 191   < > 172 205 217   < > = values in this interval not displayed.   Cardiac Enzymes: No results for input(s): CKTOTAL, CKMB, CKMBINDEX, TROPONINI in the last 168 hours. CBG: Recent Labs  Lab 08/06/19 0733 08/06/19 1123 08/06/19 1609 08/06/19 2054 08/07/19 0726  GLUCAP 109* 155* 120* 159* 120*    Iron Studies: No results for input(s): IRON, TIBC, TRANSFERRIN, FERRITIN in the last 72 hours. Studies/Results: DG CHEST PORT 1 VIEW  Result Date: 08/06/2019 CLINICAL DATA:  Shortness of breath EXAM: PORTABLE CHEST 1 VIEW COMPARISON:  CT 08/02/2019 FINDINGS: Asymmetrically increased opacity in the right hemithorax likely reflecting layering effusion on this portable supine film. Small left effusion noted as well. Patchy areas of consolidation throughout the lungs, increased from most recent comparison. No visible pneumothorax. Cardiomediastinal contours are stable with a calcified aorta. Telemetry leads overlie the chest. No acute osseous or soft tissue abnormality.  Degenerative changes are present in the imaged spine and shoulders. IMPRESSION: 1. Bilateral pleural effusions, right greater than left. 2. Patchy areas of consolidation throughout the lungs, increased from most recent comparison, likely acute infection or inflammation. Electronically Signed   By: Lovena Le M.D.   On: 08/06/2019 02:14   . apixaban  2.5 mg Oral BID  . vitamin C  500 mg Oral Daily  . aspirin  81 mg Oral Daily  . atorvastatin  20 mg Oral Daily  . timolol  1 drop Left Eye BID   And  . brimonidine  1 drop Left Eye BID  . cholecalciferol  2,000 Units Oral Daily  . dorzolamide-timolol  1 drop Left Eye BID  . doxycycline  100 mg Oral Q12H  . febuxostat  40 mg Oral Once per day on Sun Tue Fri  . finasteride  5 mg Oral Daily  . hydrALAZINE  75 mg Oral Q8H  . insulin aspart   0-5 Units Subcutaneous QHS  . insulin aspart  0-9 Units Subcutaneous TID WC  . latanoprost  1 drop Both Eyes QHS  . metoprolol tartrate  12.5 mg Oral BID  . multivitamin  1 tablet Oral BID  . pantoprazole  40 mg Oral Daily  . sodium bicarbonate  1,300 mg Oral BID  . triamcinolone cream   Topical BID    BMET    Component Value Date/Time   NA 137 08/07/2019 0333   NA 138 02/01/2012 0920   K 3.6 08/07/2019 0333   K 4.6 02/01/2012 0920   CL 104 08/07/2019 0333   CL 113 (H) 02/01/2012 0920   CO2 22 08/07/2019 0333   CO2 21 (L) 02/01/2012 0920   GLUCOSE 129 (H) 08/07/2019 0333   GLUCOSE 130 (H) 02/01/2012 0920   BUN 43 (H) 08/07/2019 0333   BUN 28.0 (H) 02/01/2012 0920   CREATININE 2.52 (H) 08/07/2019 0333   CREATININE 1.64 (H) 12/15/2015 1025   CREATININE 1.4 (H) 02/01/2012 0920   CALCIUM 8.5 (L) 08/07/2019 0333   CALCIUM 9.5 02/01/2012 0920   GFRNONAA 22 (L) 08/07/2019 0333   GFRAA 26 (L) 08/07/2019 0333   CBC    Component Value Date/Time   WBC 8.9 08/07/2019 0333   RBC 3.42 (L) 08/07/2019 0333   HGB 10.4 (L) 08/07/2019 0333   HGB 9.8 (L) 02/01/2012 0920   HCT 31.8 (L) 08/07/2019 0333   HCT 28.0 (L) 02/01/2012 0920   PLT 217 08/07/2019 0333   PLT 160 02/01/2012 0920   MCV 93.0 08/07/2019 0333   MCV 102.0 (H) 02/01/2012 0920   MCH 30.4 08/07/2019 0333   MCHC 32.7 08/07/2019 0333   RDW 13.8 08/07/2019 0333   RDW 14.4 02/01/2012 0920   LYMPHSABS 1.7 08/03/2019 0448   LYMPHSABS 0.9 02/01/2012 0920   MONOABS 1.7 (H) 08/03/2019 0448   MONOABS 0.4 02/01/2012 0920   EOSABS 0.1 08/03/2019 0448   EOSABS 0.2 02/01/2012 0920   BASOSABS 0.1 08/03/2019 0448   BASOSABS 0.1 02/01/2012 0920   Assessment/plan Multifocal pneumonia, improving: Patient with multifocal pneumonia as seen on CT chest on admission. Patient on CTX5/2and azithromycin 5/3.  -Continue on IV azithromycin (5/3-) 500 mg every 24 hours -Continue IVCTX(5/2-) 2 g every 24 hours  AKI onChronic kidney  diseaseIIIb, slightly worse today:Creatininehas ranged from 1.7-2 over the past year, on admission patient creatinine 2.6>>2.44>2.52 today5/7, 800 cc urine output after receiving IV Lasix 40mg  BID.Kappa and Lamda light chains both elevated. SPEP unremarkable for monoclonal protein.  -  Continue to monitor -Strict I&Os -Holding IV fluids -Continue Lasix at slightly decreased dose 40 mg p.o. twice daily -he has never seen a nephrologist before,will require outpatient follow up after discharge.  -no urgent indication for dialysis at this time as his Scr is not much off his baseline.  HTN, stable:BP this AM 173/99.Blood pressure treated at home with amlodipine 5 mg daily, clonidine 0.1 mg twice daily, furosemide 20 mg daily, hydralazine 25 mg 3 times daily, labetalol 100 mg twice daily. -Currently treated with 75 mg hydralazine 3 times daily, with additional 50 mg every 8 hours as needed for SBP > 180. -Amlodipine 5 mg -Amiodarone drip per cardiology -ContinueMetoprolol tartrate 12.5 mg twice dailyand increase as tolerated/needed. -Continue to monitor blood pressure on Lasix  Lower extremity swelling, improved, stable: Patient has history of lower extremity swelling, was treated yesterday with IV Lasix 40 mg twice daily, however creatinine increased -Strict I&Os -Continue Lasix at decreased dose 40 mg p.o. twice daily  A. fib with RVR, stable: Patient with new onset A. fib with RVR, and was controlled with diltiazem drip and heparin 11 mL/hour 5/3.Some episodes of asystole overnight. -Follow up Cardiology recommendations -Amiodarone drip per cardiology -Metoprolol tartrate 12.5 mg twice daily -Patienthad echo 05/04 - follow up results -Eliquis 2.5 mg twice daily  Ankle pain, resolved: new onset, no history of fall or trauma. History of gout, Uric Acid wnl at 5.6. -PT/OT -Continue to monitor   Milus Banister, [place], PGY-2 08/07/2019 9:31 AM  I  have seen and examined this patient and agree with plan and assessment in the above note with renal recommendations/intervention highlighted.  Breathing much more comfortably today.  Some lower extremity edema.  Bump in BUN/Cr following brisk diuresis with IV lasix.  Ok to change to po lasix now that respiratory status has improved.  Broadus Authur A Joshawn Crissman,MD 08/07/2019 4:01 PM

## 2019-08-07 NOTE — Progress Notes (Signed)
Patient refuses CPAP due to mask and pressure.

## 2019-08-07 NOTE — Progress Notes (Signed)
Patient assessed following call from [place] regarding heart rate. Patient was active at the time attempting to toilet. Heart rate reassessed at resting state and found to be irregular consistent with atrial fib at a rate of 97. No intervention required at this time. Patient was short of breath and prn breathing treatment was given. RN will reassess.

## 2019-08-07 NOTE — Progress Notes (Signed)
Progress Note  Patient Name: Adam [name] Date of Encounter: 08/07/2019  Primary Cardiologist: Dorris Carnes, MD   Subjective   Feeling well.  Denies chest pain, palpitations or shortness of breath.   Inpatient Medications    Scheduled Meds: . amiodarone  150 mg Intravenous Once  . amLODipine  5 mg Oral Daily  . apixaban  2.5 mg Oral BID  . vitamin C  500 mg Oral Daily  . aspirin  81 mg Oral Daily  . atorvastatin  20 mg Oral Daily  . timolol  1 drop Left Eye BID   And  . brimonidine  1 drop Left Eye BID  . cholecalciferol  2,000 Units Oral Daily  . dorzolamide-timolol  1 drop Left Eye BID  . doxycycline  100 mg Oral Q12H  . febuxostat  40 mg Oral Once per day on Sun Tue Fri  . finasteride  5 mg Oral Daily  . furosemide  40 mg Intravenous BID  . hydrALAZINE  100 mg Oral Q8H  . insulin aspart  0-5 Units Subcutaneous QHS  . insulin aspart  0-9 Units Subcutaneous TID WC  . latanoprost  1 drop Both Eyes QHS  . metoprolol tartrate  12.5 mg Oral BID  . multivitamin  1 tablet Oral BID  . pantoprazole  40 mg Oral Daily  . sodium bicarbonate  1,300 mg Oral BID  . triamcinolone cream   Topical BID   Continuous Infusions: . amiodarone 60 mg/hr (08/07/19 0925)   Followed by  . amiodarone    . cefTRIAXone (ROCEPHIN)  IV 2 g (08/06/19 2115)   PRN Meds: acetaminophen, hydrALAZINE, ipratropium, levalbuterol, metoprolol tartrate, ondansetron (ZOFRAN) IV   Vital Signs    Vitals:   08/07/19 0630 08/07/19 0734 08/07/19 0737 08/07/19 0909  BP:  (!) 174/112  (!) 173/99  Pulse: (!) 112 (!) 147 (!) 130 (!) 119  Resp:  19    Temp:  97.6 F (36.4 C)    TempSrc:  Oral    SpO2:  98%  98%  Weight:      Height:        Intake/Output Summary (Last 24 hours) at 08/07/2019 0928 Last data filed at 08/07/2019 0509 Gross per 24 hour  Intake --  Output 950 ml  Net -950 ml   Last 3 Weights 08/03/2019 08/03/2019 05/21/2018  Weight (lbs) 160 lb 7.9 oz 160 lb 149 lb  Weight (kg) 72.8 kg 72.576 kg  67.586 kg      Telemetry    Currently sinus rhythm.  Had atrial fibrillation with rates in the 120s overnight.  Postconversion pauses are much shorter.  - Personally Reviewed  ECG    08/04/2019: Sinus rhythm.  Rate 64 bpm.  Right bundle branch block.  LAFB.- Personally Reviewed  Physical Exam   VS:  BP (!) 173/99   Pulse (!) 119   Temp 97.6 F (36.4 C) (Oral)   Resp 19   Ht 5\' 4"  (1.626 m)   Wt 72.8 kg   SpO2 98%   BMI 27.55 kg/m  , BMI Body mass index is 27.55 kg/m. GENERAL:  Well appearing HEENT: Pupils equal round and reactive, fundi not visualized, oral mucosa unremarkable NECK:  No jugular venous distention, waveform within normal limits, carotid upstroke brisk and symmetric, no bruits LUNGS: Less wheezing HEART: Tachycardic.  Irregularly irregular.  PMI not displaced or sustained,S1 and S2 within normal limits, no S3, no S4, no clicks, no rubs, III/VI mid peaking systolic murmur ABD:  Flat, positive bowel sounds normal in frequency in pitch, no bruits, no rebound, no guarding, no midline pulsatile mass, no hepatomegaly, no splenomegaly EXT:  2 plus pulses throughout, 2+ LE edema to above the ankles bilaterally, no cyanosis no clubbing SKIN:  No rashes no nodules NEURO:  Cranial nerves II through XII grossly intact, motor grossly intact throughout [facility]:  Cognitively intact, oriented to person place and time   Labs    High Sensitivity Troponin:   Recent Labs  Lab 08/02/19 1623 08/02/19 1841  TROPONINIHS 20* 18*      Chemistry Recent Labs  Lab 08/02/19 1623 08/02/19 1623 08/03/19 0326 08/04/19 0342 08/05/19 0359 08/06/19 0313 08/07/19 0333  NA 138   < > 137   < > 140 135 137  K 4.5   < > 5.5*   < > 4.6 4.1 3.6  CL 106   < > 105   < > 107 109 104  CO2 21*   < > 21*   < > 21* 19* 22  GLUCOSE 121*   < > 112*   < > 117* 123* 129*  BUN 32*   < > 33*   < > 29* 34* 43*  CREATININE 2.45*   < > 2.60*   < > 2.46* 2.44* 2.52*  CALCIUM 8.9   < > 8.6*   < > 8.6*  8.1* 8.5*  PROT 6.4*  --  5.8*  --   --   --  5.9*  ALBUMIN 3.8  --  3.4*  --   --   --  2.8*  AST 14*  --  20  --   --   --  21  ALT 12  --  10  --   --   --  21  ALKPHOS 57  --  50  --   --   --  72  BILITOT 0.7  --  1.5*  --   --   --  0.5  GFRNONAA 23*   < > 21*   < > 23* 23* 22*  GFRAA 26*   < > 25*   < > 26* 27* 26*  ANIONGAP 11   < > 11   < > 12 7 11    < > = values in this interval not displayed.     Hematology Recent Labs  Lab 08/03/19 0448 08/06/19 0954 08/07/19 0333  WBC 10.2 10.0 8.9  RBC 3.37* 3.32* 3.42*  HGB 10.1* 10.0* 10.4*  HCT 32.5* 31.3* 31.8*  MCV 96.4 94.3 93.0  MCH 30.0 30.1 30.4  MCHC 31.1 31.9 32.7  RDW 14.4 13.9 13.8  PLT 172 205 217    BNP Recent Labs  Lab 08/02/19 1623 08/04/19 0639 08/05/19 0359  BNP 608.2* 1,116.7* 1,476.0*     DDimer  Recent Labs  Lab 08/02/19 1841  DDIMER 0.68*     Radiology    DG CHEST PORT 1 VIEW  Result Date: 08/06/2019 CLINICAL DATA:  Shortness of breath EXAM: PORTABLE CHEST 1 VIEW COMPARISON:  CT 08/02/2019 FINDINGS: Asymmetrically increased opacity in the right hemithorax likely reflecting layering effusion on this portable supine film. Small left effusion noted as well. Patchy areas of consolidation throughout the lungs, increased from most recent comparison. No visible pneumothorax. Cardiomediastinal contours are stable with a calcified aorta. Telemetry leads overlie the chest. No acute osseous or soft tissue abnormality. Degenerative changes are present in the imaged spine and shoulders. IMPRESSION: 1. Bilateral pleural effusions, right greater than left. 2.  Patchy areas of consolidation throughout the lungs, increased from most recent comparison, likely acute infection or inflammation. Electronically Signed   By: Lovena Le M.D.   On: 08/06/2019 02:14    Cardiac Studies   Echo 08/04/19:   1. Left ventricular ejection fraction, by estimation, is 60 to 65%. The  left ventricle has normal function. The  left ventricle has no regional  wall motion abnormalities. Left ventricular diastolic parameters are  consistent with Grade II diastolic  dysfunction (pseudonormalization).  2. Right ventricular systolic function is normal. The right ventricular  size is normal.  3. Left atrial size was mild to moderately dilated.  4. Right atrial size was mildly dilated.  5. The mitral valve is grossly normal. Mild mitral valve regurgitation.  No evidence of mitral stenosis.  6. The aortic valve is abnormal. Aortic valve regurgitation is trivial.  Moderate aortic valve stenosis.  7. The inferior vena cava is dilated in size with >50% respiratory  variability, suggesting right atrial pressure of 8 mmHg.   Patient Profile     Mr. Llerena is an 30M with hypertension, DM, CKD IV and mild aortic stenosis admitted with back pain and found to have pneumonia.  Since admission he also went into atrial fibrillation with RVR.  Assessment & Plan    # Paroxysmal atrial fibrillation: # Sick sinus syndrome: Mr. Rennels is back in atrial fibrillation and rates are poorly controlled.  EP is following and has started him on an IV amiodarone.  We will continue to watch for postconversion pauses on telemetry.  Continue metoprolol and Eliquis.  He previously had over 4-second pauses.  He may need a pacemaker in the long run but not at this time.  Likely triggered by his pneumonia, though given his lack of symptoms is quite possible he has been in and out of atrial fibrillation for a long time.  # Aortic stenosis: Moderate on echo this admission.  Mean gradient 30 mmHg. Monitor as outpatient.  # Hypertension:  BP continues to be elevated.  We will add amlodipine 5 mg daily.  We will also increase hydralazine to 100 mg 3 times daily and continue metoprolol.  # CKD IV:  Creatinine stable.  He has some lower extremity edema.  Continue IV Lasix and will add TED hose.  Likely switch to oral Lasix tomorrow.  # CAP: Per  primary team.         For questions or updates, please contact [place] Please consult [email or web address] for contact info under        Signed, Skeet Latch, MD  08/07/2019, 9:28 AM

## 2019-08-07 NOTE — Progress Notes (Addendum)
PROGRESS NOTE    Adam [name]  CWC:[id number] DOB: November 16, 1930 DOA: 08/02/2019 PCP: Javier Glazier, MD     Brief Narrative:  Adam [name] is an 86 year old male with past medical history significant for hyperlipidemia, hypertension who presented to the hospital on 5/2 due to back pain, abdominal pain and shortness of breath.  Work-up in the emergency department revealed multifocal pneumonia and patient was admitted and started on IV antibiotics.  On 5/3, patient went into A. fib RVR and was started on IV Cardizem, IV heparin.  Cardiology was consulted at that time.  Also had postconversion pauses seen on telemetry.  Nephrology also following for acute kidney injury.  New events last 24 hours / Subjective: Patient is sitting in chair, has no physical complaints.  Went back into A. fib RVR with rates in the 140s this morning.  He denies any dizziness, lightheadedness, chest pain or pressure.  Denies worsening shortness of breath.  Had episode of nausea and dry heaving this morning which has now resolved  Assessment & Plan:   Principal Problem:   CAP (community acquired pneumonia) Active Problems:   HTN (hypertension)   HLD (hyperlipidemia)   Type 2 diabetes mellitus (HCC)   BPH (benign prostatic hyperplasia)   Leucocytosis   ARF (acute renal failure) (HCC)   Hypoxia   Atrial fibrillation, new onset (HCC)   Atrial fibrillation with RVR (HCC)   Multifocal pneumonia -Chest x-ray showed mild hazy infrahilar opacification right worse than left -CT chest revealed small bilateral pleural effusion, concern for multifocal pneumonia -Blood cultures negative to date -SARS-CoV-2, influenza negative -Doxycycline, Rocephin x 7 days   A. fib RVR -With postconversion pauses and bradycardia -Eliquis -Continue metoprolol.  Started on IV amiodarone -Appreciate cardiology, EP   Acute hypoxemic respiratory failure secondary to acute on chronic diastolic heart failure -Chest x-ray obtained 5/6,  independently reviewed, increased pulmonary congestion -IV Lasix  Acute kidney injury on CKD stage IIIb -Baseline creatinine 1.7 -Renal ultrasound without hydronephrosis, increased renal parenchymal echogenicity and parenchymal atrophy consistent with chronic kidney disease -Appreciate nephrology  Elevated troponin -Flat trend, 20 --> 18 -Likely demand ischemia secondary to above  Type 2 diabetes, well controlled -Hemoglobin A1c 5.7 -Sliding scale insulin  Hypertension -Continue hydralazine, metoprolol, Norvasc  Hyperlipidemia -Continue Lipitor , BPH -Continue Proscar   DVT prophylaxis: Eliquis Code Status: DNR Family Communication: Daughter at bedside Disposition Plan:   Status is: Inpatient  Remains inpatient appropriate because:Hemodynamically unstable, IV treatments appropriate due to intensity of illness or inability to take PO and Inpatient level of care appropriate due to severity of illness   Dispo: The patient is from: Independent living facility              Anticipated d/c is to: Independent living facility with home health              Anticipated d/c date is: 2 days              Patient currently is not medically stable to d/c.  IV Lasix, IV amiodarone, continue to monitor BMP, respiratory status, A. fib.  Continue to treat multifocal pneumonia  Consultants:   Cardiology  EP  Nephrology   Antimicrobials:  Anti-infectives (From admission, onward)   Start     Dose/Rate Route Frequency Ordered Stop   08/05/19 1200  doxycycline (VIBRA-TABS) tablet 100 mg     100 mg Oral Every 12 hours 08/05/19 1121 08/09/19 2359   08/04/19 0000  azithromycin (ZITHROMAX) 500 mg in sodium  chloride 0.9 % 250 mL IVPB  Status:  Discontinued     500 mg 250 mL/hr over 60 Minutes Intravenous Every 24 hours 08/03/19 0325 08/05/19 1121   08/03/19 2200  cefTRIAXone (ROCEPHIN) 1 g in sodium chloride 0.9 % 100 mL IVPB  Status:  Discontinued     1 g 200 mL/hr over 30 Minutes  Intravenous Every 24 hours 08/03/19 0325 08/03/19 2118   08/03/19 2200  cefTRIAXone (ROCEPHIN) 2 g in sodium chloride 0.9 % 100 mL IVPB     2 g 200 mL/hr over 30 Minutes Intravenous Every 24 hours 08/03/19 2118 08/10/19 2159   08/02/19 2030  cefTRIAXone (ROCEPHIN) 2 g in sodium chloride 0.9 % 100 mL IVPB  Status:  Discontinued     2 g 200 mL/hr over 30 Minutes Intravenous Every 24 hours 08/02/19 2019 08/03/19 0349   08/02/19 2030  azithromycin (ZITHROMAX) 500 mg in sodium chloride 0.9 % 250 mL IVPB  Status:  Discontinued     500 mg 250 mL/hr over 60 Minutes Intravenous Every 24 hours 08/02/19 2019 08/03/19 0350       Objective: Vitals:   08/07/19 0630 08/07/19 0734 08/07/19 0737 08/07/19 0909  BP:  (!) 174/112  (!) 173/99  Pulse: (!) 112 (!) 147 (!) 130 (!) 119  Resp:  19    Temp:  97.6 F (36.4 C)    TempSrc:  Oral    SpO2:  98%  98%  Weight:      Height:        Intake/Output Summary (Last 24 hours) at 08/07/2019 1217 Last data filed at 08/07/2019 1054 Gross per 24 hour  Intake --  Output 1100 ml  Net -1100 ml   Filed Weights   08/03/19 0315 08/03/19 1048  Weight: 72.6 kg 72.8 kg    Examination: General exam: Appears calm and comfortable  Respiratory system: Clear to auscultation. Respiratory effort normal. Cardiovascular system: S1 & S2 heard, irregular rhythm, rate 140s.  Gastrointestinal system: Abdomen is nondistended, soft and nontender. Normal bowel sounds heard. Central nervous system: Alert and oriented. Non focal exam. Speech clear  Extremities: Symmetric in appearance bilaterally  Skin: No rashes, lesions or ulcers on exposed skin  Psychiatry: Judgement and insight appear stable. Mood & affect appropriate.    Data Reviewed: I have personally reviewed following labs and imaging studies  CBC: Recent Labs  Lab 08/02/19 1623 08/03/19 0448 08/06/19 0954 08/07/19 0333  WBC 11.7* 10.2 10.0 8.9  NEUTROABS  --  6.5  --   --   HGB 10.6* 10.1* 10.0* 10.4*   HCT 33.1* 32.5* 31.3* 31.8*  MCV 94.8 96.4 94.3 93.0  PLT 191 172 205 562   Basic Metabolic Panel: Recent Labs  Lab 08/03/19 0326 08/04/19 0342 08/05/19 0359 08/06/19 0313 08/07/19 0333  NA 137 140 140 135 137  K 5.5* 4.3 4.6 4.1 3.6  CL 105 108 107 109 104  CO2 21* 24 21* 19* 22  GLUCOSE 112* 122* 117* 123* 129*  BUN 33* 33* 29* 34* 43*  CREATININE 2.60* 2.59* 2.46* 2.44* 2.52*  CALCIUM 8.6* 8.3* 8.6* 8.1* 8.5*  MG  --  2.0 2.0  --   --    GFR: Estimated Creatinine Clearance: 18.9 mL/min (A) (by C-G formula based on SCr of 2.52 mg/dL (H)). Liver Function Tests: Recent Labs  Lab 08/02/19 1623 08/03/19 0326 08/07/19 0333  AST 14* 20 21  ALT 12 10 21   ALKPHOS 57 50 72  BILITOT 0.7 1.5* 0.5  PROT 6.4* 5.8* 5.9*  ALBUMIN 3.8 3.4* 2.8*   Recent Labs  Lab 08/02/19 1623  LIPASE 36   Recent Labs  Lab 08/05/19 0359  AMMONIA 17   Coagulation Profile: No results for input(s): INR, PROTIME in the last 168 hours. Cardiac Enzymes: No results for input(s): CKTOTAL, CKMB, CKMBINDEX, TROPONINI in the last 168 hours. BNP (last 3 results) No results for input(s): PROBNP in the last 8760 hours. HbA1C: No results for input(s): HGBA1C in the last 72 hours. CBG: Recent Labs  Lab 08/06/19 0733 08/06/19 1123 08/06/19 1609 08/06/19 2054 08/07/19 0726  GLUCAP 109* 155* 120* 159* 120*   Lipid Profile: Recent Labs    08/05/19 0359  CHOL 106  HDL 33*  LDLCALC 56  TRIG 85  CHOLHDL 3.2   Thyroid Function Tests: No results for input(s): TSH, T4TOTAL, FREET4, T3FREE, THYROIDAB in the last 72 hours. Anemia Panel: Recent Labs    08/05/19 0359  VITAMINB12 501   Sepsis Labs: Recent Labs  Lab 08/04/19 1638 08/04/19 0933 08/06/19 0313 08/07/19 0333  PROCALCITON 0.94  --  0.65 0.47  LATICACIDVEN 1.2 1.4  --   --     Recent Results (from the past 240 hour(s))  Respiratory Panel by RT PCR (Flu A&B, Covid) - Nasopharyngeal Swab     Status: None   Collection Time:  08/02/19  6:18 PM   Specimen: Nasopharyngeal Swab  Result Value Ref Range Status   SARS Coronavirus 2 by RT PCR NEGATIVE NEGATIVE Final    Comment: (NOTE) SARS-CoV-2 target nucleic acids are NOT DETECTED. The SARS-CoV-2 RNA is generally detectable in upper respiratoy specimens during the acute phase of infection. The lowest concentration of SARS-CoV-2 viral copies this assay can detect is 131 copies/mL. A negative result does not preclude SARS-Cov-2 infection and should not be used as the sole basis for treatment or other patient management decisions. A negative result may occur with  improper specimen collection/handling, submission of specimen other than nasopharyngeal swab, presence of viral mutation(s) within the areas targeted by this assay, and inadequate number of viral copies (<131 copies/mL). A negative result must be combined with clinical observations, patient history, and epidemiological information. The expected result is Negative. Fact Sheet for Patients:  PinkCheek.be Fact Sheet for Healthcare Providers:  GravelBags.it This test is not yet ap proved or cleared by the [place] FDA and  has been authorized for detection and/or diagnosis of SARS-CoV-2 by FDA under an Emergency Use Authorization (EUA). This EUA will remain  in effect (meaning this test can be used) for the duration of the COVID-19 declaration under Section 564(b)(1) of the Act, 21 U.S.C. section 360bbb-3(b)(1), unless the authorization is terminated or revoked sooner.    Influenza A by PCR NEGATIVE NEGATIVE Final   Influenza B by PCR NEGATIVE NEGATIVE Final    Comment: (NOTE) The Xpert Xpress SARS-CoV-2/FLU/RSV assay is intended as an aid in  the diagnosis of influenza from Nasopharyngeal swab specimens and  should not be used as a sole basis for treatment. Nasal washings and  aspirates are unacceptable for Xpert Xpress SARS-CoV-2/FLU/RSV   testing. Fact Sheet for Patients: PinkCheek.be Fact Sheet for Healthcare Providers: GravelBags.it This test is not yet approved or cleared by the [place] FDA and  has been authorized for detection and/or diagnosis of SARS-CoV-2 by  FDA under an Emergency Use Authorization (EUA). This EUA will remain  in effect (meaning this test can be used) for the duration of the  Covid-19 declaration under Section  564(b)(1) of the Act, 21  U.S.C. section 360bbb-3(b)(1), unless the authorization is  terminated or revoked. Performed at [facility], [place] [address], [place], [place] [address]   Culture, blood (routine x 2) Call MD if unable to obtain prior to antibiotics being given     Status: None (Preliminary result)   Collection Time: 08/03/19  4:20 AM   Specimen: BLOOD  Result Value Ref Range Status   Specimen Description BLOOD RIGHT WRIST  Final   Special Requests   Final    BOTTLES DRAWN AEROBIC AND ANAEROBIC Blood Culture results may not be optimal due to an inadequate volume of blood received in culture bottles   Culture   Final    NO GROWTH 4 DAYS Performed at [facility], [place] [address], [place], [place] [address]    Report Status PENDING  Incomplete  Culture, blood (Routine X 2) w Reflex to ID Panel     Status: None (Preliminary result)   Collection Time: 08/03/19 10:43 PM   Specimen: BLOOD RIGHT ARM  Result Value Ref Range Status   Specimen Description BLOOD RIGHT ARM  Final   Special Requests   Final    BOTTLES DRAWN AEROBIC AND ANAEROBIC Blood Culture adequate volume   Culture   Final    NO GROWTH 4 DAYS Performed at [facility], [place] [address] [place], [place] [address]    Report Status PENDING  Incomplete      Radiology Studies: DG CHEST PORT 1 VIEW  Result Date: 08/06/2019 CLINICAL DATA:  Shortness of breath EXAM: PORTABLE CHEST 1 VIEW COMPARISON:  CT 08/02/2019 FINDINGS: Asymmetrically  increased opacity in the right hemithorax likely reflecting layering effusion on this portable supine film. Small left effusion noted as well. Patchy areas of consolidation throughout the lungs, increased from most recent comparison. No visible pneumothorax. Cardiomediastinal contours are stable with a calcified aorta. Telemetry leads overlie the chest. No acute osseous or soft tissue abnormality. Degenerative changes are present in the imaged spine and shoulders. IMPRESSION: 1. Bilateral pleural effusions, right greater than left. 2. Patchy areas of consolidation throughout the lungs, increased from most recent comparison, likely acute infection or inflammation. Electronically Signed   By: Lovena Le M.D.   On: 08/06/2019 02:14      Scheduled Meds: . amLODipine  5 mg Oral Daily  . apixaban  2.5 mg Oral BID  . vitamin C  500 mg Oral Daily  . aspirin  81 mg Oral Daily  . atorvastatin  20 mg Oral Daily  . timolol  1 drop Left Eye BID   And  . brimonidine  1 drop Left Eye BID  . cholecalciferol  2,000 Units Oral Daily  . dorzolamide-timolol  1 drop Left Eye BID  . doxycycline  100 mg Oral Q12H  . febuxostat  40 mg Oral Once per day on Sun Tue Fri  . finasteride  5 mg Oral Daily  . furosemide  40 mg Oral BID  . hydrALAZINE  100 mg Oral Q8H  . insulin aspart  0-5 Units Subcutaneous QHS  . insulin aspart  0-9 Units Subcutaneous TID WC  . latanoprost  1 drop Both Eyes QHS  . metoprolol tartrate  12.5 mg Oral BID  . multivitamin  1 tablet Oral BID  . pantoprazole  40 mg Oral Daily  . sodium bicarbonate  1,300 mg Oral BID  . triamcinolone cream   Topical BID   Continuous Infusions: . amiodarone  Followed by  . amiodarone    . cefTRIAXone (ROCEPHIN)  IV 2 g (08/06/19 2115)     LOS: 5 days      Time spent: 35 minutes   Dessa Phi, DO [facility] 08/07/2019, 12:17 PM   Available via Epic secure chat 7am-7pm After these hours, please refer to coverage provider listed  on [email or web address]

## 2019-08-08 LAB — CBC
HCT: 31.9 % — ABNORMAL LOW (ref 39.0–52.0)
Hemoglobin: 10.4 g/dL — ABNORMAL LOW (ref 13.0–17.0)
MCH: 30.1 pg (ref 26.0–34.0)
MCHC: 32.6 g/dL (ref 30.0–36.0)
MCV: 92.5 fL (ref 80.0–100.0)
Platelets: 231 10*3/uL (ref 150–400)
RBC: 3.45 MIL/uL — ABNORMAL LOW (ref 4.22–5.81)
RDW: 13.9 % (ref 11.5–15.5)
WBC: 10.6 10*3/uL — ABNORMAL HIGH (ref 4.0–10.5)
nRBC: 0 % (ref 0.0–0.2)

## 2019-08-08 LAB — CULTURE, BLOOD (ROUTINE X 2)
Culture: NO GROWTH
Culture: NO GROWTH
Special Requests: ADEQUATE

## 2019-08-08 LAB — BASIC METABOLIC PANEL
Anion gap: 14 (ref 5–15)
BUN: 43 mg/dL — ABNORMAL HIGH (ref 8–23)
CO2: 23 mmol/L (ref 22–32)
Calcium: 8.9 mg/dL (ref 8.9–10.3)
Chloride: 103 mmol/L (ref 98–111)
Creatinine, Ser: 2.67 mg/dL — ABNORMAL HIGH (ref 0.61–1.24)
GFR calc Af Amer: 24 mL/min — ABNORMAL LOW (ref >60)
GFR calc non Af Amer: 21 mL/min — ABNORMAL LOW (ref >60)
Glucose, Bld: 129 mg/dL — ABNORMAL HIGH (ref 70–99)
Potassium: 3.9 mmol/L (ref 3.5–5.1)
Sodium: 140 mmol/L (ref 135–145)

## 2019-08-08 LAB — GLUCOSE, CAPILLARY
Glucose-Capillary: 130 mg/dL — ABNORMAL HIGH (ref 70–99)
Glucose-Capillary: 121 mg/dL — ABNORMAL HIGH (ref 70–99)
Glucose-Capillary: 129 mg/dL — ABNORMAL HIGH (ref 70–99)

## 2019-08-08 MED ORDER — CARVEDILOL 6.25 MG PO TABS
6.2500 mg | ORAL_TABLET | Freq: Two times a day (BID) | ORAL | Status: DC
  Administered 2019-08-08 – 2019-08-09 (×3): 6.25 mg via ORAL
  Filled 2019-08-08 (×3): qty 1

## 2019-08-08 MED ORDER — IPRATROPIUM BROMIDE 0.02 % IN SOLN
0.5000 mg | Freq: Two times a day (BID) | RESPIRATORY_TRACT | Status: DC
  Administered 2019-08-08 – 2019-08-09 (×2): 0.5 mg via RESPIRATORY_TRACT
  Filled 2019-08-08 (×2): qty 2.5

## 2019-08-08 MED ORDER — IPRATROPIUM BROMIDE 0.02 % IN SOLN
0.5000 mg | Freq: Four times a day (QID) | RESPIRATORY_TRACT | Status: DC
  Administered 2019-08-08: 0.5 mg via RESPIRATORY_TRACT
  Filled 2019-08-08: qty 2.5

## 2019-08-08 NOTE — Progress Notes (Signed)
Progress Note  Patient Name: Adam [name] Date of Encounter: 08/08/2019  Primary Cardiologist: Dorris Carnes, MD   Subjective   Denies chest pain. Had some trouble sleeping  Inpatient Medications    Scheduled Meds: . amLODipine  5 mg Oral Daily  . apixaban  2.5 mg Oral BID  . vitamin C  500 mg Oral Daily  . aspirin  81 mg Oral Daily  . atorvastatin  20 mg Oral Daily  . timolol  1 drop Left Eye BID   And  . brimonidine  1 drop Left Eye BID  . cholecalciferol  2,000 Units Oral Daily  . dorzolamide-timolol  1 drop Left Eye BID  . doxycycline  100 mg Oral Q12H  . febuxostat  40 mg Oral Once per day on Sun Tue Fri  . finasteride  5 mg Oral Daily  . furosemide  40 mg Oral BID  . hydrALAZINE  100 mg Oral Q8H  . insulin aspart  0-5 Units Subcutaneous QHS  . insulin aspart  0-9 Units Subcutaneous TID WC  . ipratropium  0.5 mg Nebulization Q6H  . latanoprost  1 drop Both Eyes QHS  . metoprolol tartrate  12.5 mg Oral BID  . multivitamin  1 tablet Oral BID  . pantoprazole  40 mg Oral Daily  . sodium bicarbonate  1,300 mg Oral BID  . triamcinolone cream   Topical BID   Continuous Infusions: . amiodarone 30 mg/hr (08/08/19 0434)  . cefTRIAXone (ROCEPHIN)  IV 2 g (08/07/19 2154)   PRN Meds: acetaminophen, hydrALAZINE, levalbuterol, metoprolol tartrate, ondansetron (ZOFRAN) IV   Vital Signs    Vitals:   08/08/19 0222 08/08/19 0616 08/08/19 0624 08/08/19 0857  BP: (!) 170/64 (!) 190/76 (!) 190/76 (!) 163/66  Pulse: 81  81 83  Resp: 20   18  Temp: 98.5 F (36.9 C)   97.9 F (36.6 C)  TempSrc:      SpO2: 96%   96%  Weight:      Height:        Intake/Output Summary (Last 24 hours) at 08/08/2019 1040 Last data filed at 08/08/2019 0626 Gross per 24 hour  Intake 340 ml  Output 776 ml  Net -436 ml   Filed Weights   08/03/19 0315 08/03/19 1048  Weight: 72.6 kg 72.8 kg    Telemetry    NSR since yesterday with PVC's - Personally Reviewed  ECG    none - Personally  Reviewed  Physical Exam   GEN: No acute distress.   Neck: No JVD Cardiac: RRR, 2/6 AS murmurs, rubs, or gallops.  Respiratory: Clear to auscultation bilaterally except for basilar rales. GI: Soft, nontender, non-distended  MS: No edema; No deformity. Neuro:  Nonfocal  Psych: Normal affect   Labs    Chemistry Recent Labs  Lab 08/02/19 1623 08/02/19 1623 08/03/19 0326 08/04/19 0342 08/06/19 0313 08/07/19 0333 08/08/19 0402  NA 138   < > 137   < > 135 137 140  K 4.5   < > 5.5*   < > 4.1 3.6 3.9  CL 106   < > 105   < > 109 104 103  CO2 21*   < > 21*   < > 19* 22 23  GLUCOSE 121*   < > 112*   < > 123* 129* 129*  BUN 32*   < > 33*   < > 34* 43* 43*  CREATININE 2.45*   < > 2.60*   < > 2.44* 2.52*  2.67*  CALCIUM 8.9   < > 8.6*   < > 8.1* 8.5* 8.9  PROT 6.4*  --  5.8*  --   --  5.9*  --   ALBUMIN 3.8  --  3.4*  --   --  2.8*  --   AST 14*  --  20  --   --  21  --   ALT 12  --  10  --   --  21  --   ALKPHOS 57  --  50  --   --  72  --   BILITOT 0.7  --  1.5*  --   --  0.5  --   GFRNONAA 23*   < > 21*   < > 23* 22* 21*  GFRAA 26*   < > 25*   < > 27* 26* 24*  ANIONGAP 11   < > 11   < > 7 11 14    < > = values in this interval not displayed.     Hematology Recent Labs  Lab 08/06/19 0954 08/07/19 0333 08/08/19 0402  WBC 10.0 8.9 10.6*  RBC 3.32* 3.42* 3.45*  HGB 10.0* 10.4* 10.4*  HCT 31.3* 31.8* 31.9*  MCV 94.3 93.0 92.5  MCH 30.1 30.4 30.1  MCHC 31.9 32.7 32.6  RDW 13.9 13.8 13.9  PLT 205 217 231    Cardiac EnzymesNo results for input(s): TROPONINI in the last 168 hours. No results for input(s): TROPIPOC in the last 168 hours.   BNP Recent Labs  Lab 08/02/19 1623 08/04/19 0639 08/05/19 0359  BNP 608.2* 1,116.7* 1,476.0*     DDimer  Recent Labs  Lab 08/02/19 1841  DDIMER 0.68*     Radiology    No results found.  Cardiac Studies   none  Patient Profile     86 y.o. male admitted with pneumonia and found to have atrial fib in the setting of  chronic renal insufficiency  Assessment & Plan    1. Atrial fib - he has responded to IV amio. We will keep him on IV amio today and if no more atrial fib, switch to oral amio tomorrow. 2. HTN - his bp is up at times. We will continue his current meds. I switch to coreg from metoprolol. Continue hydralazine 3. AS - moderate/severe but not surgical at this time. 4. Acute on chronic renal insufficiency - note a slight increase in the creatinine. Renal service is following.      For questions or updates, please contact [place] Please consult [email or web address] for contact info under Cardiology/STEMI.      Signed, [name], MD  08/08/2019, 10:40 AM  Patient ID: Adam [name], male   DOB: July 20, 1930, 86 y.o.   MRN: [id number]

## 2019-08-08 NOTE — Progress Notes (Signed)
Patient refuses CPAP cannot tolerate the mask.

## 2019-08-08 NOTE — Progress Notes (Signed)
PROGRESS NOTE    Adam [name]  YTK:[id number] DOB: Apr 30, 1930 DOA: 08/02/2019 PCP: Javier Glazier, MD     Brief Narrative:  Adam [name] is an 86 year old male with past medical history significant for hyperlipidemia, hypertension who presented to the hospital on 5/2 due to back pain, abdominal pain and shortness of breath.  Work-up in the emergency department revealed multifocal pneumonia and patient was admitted and started on IV antibiotics.  On 5/3, patient went into A. fib RVR and was started on IV Cardizem, IV heparin.  Cardiology was consulted at that time.  Also had postconversion pauses seen on telemetry.  Nephrology also following for acute kidney injury. Due to continued A fib RVR, he was switched to IV amiodarone.   New events last 24 hours / Subjective: Patient is sitting in chair, states he is feeling better today. Denies SOB. Has left shoulder discomfort, wonders if he slept on it wrong and asking to try a heating pad. BP elevated overnight.   Assessment & Plan:   Principal Problem:   CAP (community acquired pneumonia) Active Problems:   HTN (hypertension)   HLD (hyperlipidemia)   Type 2 diabetes mellitus (HCC)   BPH (benign prostatic hyperplasia)   Leucocytosis   ARF (acute renal failure) (HCC)   Hypoxia   Atrial fibrillation, new onset (HCC)   Atrial fibrillation with RVR (HCC)   Multifocal pneumonia -Chest x-ray showed mild hazy infrahilar opacification right worse than left -CT chest revealed small bilateral pleural effusion, concern for multifocal pneumonia -Blood cultures negative to date -SARS-CoV-2, influenza negative -Doxycycline, Rocephin x 7 days (last day 5/9)  -Repeat CXR in AM   A. fib RVR -With postconversion pauses and bradycardia -Eliquis -Continue metoprolol.  IV amiodarone -Appreciate cardiology, EP   Acute hypoxemic respiratory failure secondary to acute on chronic diastolic heart failure -Chest x-ray obtained 5/6, independently reviewed,  increased pulmonary congestion -PO Lasix -Wean O2 and ordered home desat O2 screen. Hopefully will not have to discharge with O2   Acute kidney injury on CKD stage IIIb -Baseline creatinine 1.7 -Renal ultrasound without hydronephrosis, increased renal parenchymal echogenicity and parenchymal atrophy consistent with chronic kidney disease -Appreciate nephrology  Elevated troponin -Flat trend, 20 --> 18 -Likely demand ischemia secondary to above  Type 2 diabetes, well controlled -Hemoglobin A1c 5.7 -Sliding scale insulin  Hypertension -Continue hydralazine, metoprolol, Norvasc  Hyperlipidemia -Continue Lipitor  BPH -Continue Proscar   DVT prophylaxis: Eliquis Code Status: DNR Family Communication: Daughters at bedside Disposition Plan:   Status is: Inpatient  Remains inpatient appropriate because:IV treatments appropriate due to intensity of illness or inability to take PO and Inpatient level of care appropriate due to severity of illness   Dispo: The patient is from: Independent living facility              Anticipated d/c is to: Independent living facility with home health              Anticipated d/c date is: 1-2 days              Patient currently is not medically stable to d/c.  Remains on IV amiodarone, continue to monitor BMP, respiratory status, A. fib.  Continue to treat multifocal pneumonia. Wean O2 and repeat CXR in AM.   Consultants:   Cardiology  EP  Nephrology   Antimicrobials:  Anti-infectives (From admission, onward)   Start     Dose/Rate Route Frequency Ordered Stop   08/05/19 1200  doxycycline (VIBRA-TABS) tablet 100 mg  100 mg Oral Every 12 hours 08/05/19 1121 08/09/19 2359   08/04/19 0000  azithromycin (ZITHROMAX) 500 mg in sodium chloride 0.9 % 250 mL IVPB  Status:  Discontinued     500 mg 250 mL/hr over 60 Minutes Intravenous Every 24 hours 08/03/19 0325 08/05/19 1121   08/03/19 2200  cefTRIAXone (ROCEPHIN) 1 g in sodium chloride 0.9 %  100 mL IVPB  Status:  Discontinued     1 g 200 mL/hr over 30 Minutes Intravenous Every 24 hours 08/03/19 0325 08/03/19 2118   08/03/19 2200  cefTRIAXone (ROCEPHIN) 2 g in sodium chloride 0.9 % 100 mL IVPB     2 g 200 mL/hr over 30 Minutes Intravenous Every 24 hours 08/03/19 2118 08/08/19 2359   08/02/19 2030  cefTRIAXone (ROCEPHIN) 2 g in sodium chloride 0.9 % 100 mL IVPB  Status:  Discontinued     2 g 200 mL/hr over 30 Minutes Intravenous Every 24 hours 08/02/19 2019 08/03/19 0349   08/02/19 2030  azithromycin (ZITHROMAX) 500 mg in sodium chloride 0.9 % 250 mL IVPB  Status:  Discontinued     500 mg 250 mL/hr over 60 Minutes Intravenous Every 24 hours 08/02/19 2019 08/03/19 0350       Objective: Vitals:   08/08/19 0222 08/08/19 0616 08/08/19 0624 08/08/19 0857  BP: (!) 170/64 (!) 190/76 (!) 190/76 (!) 163/66  Pulse: 81  81 83  Resp: 20   18  Temp: 98.5 F (36.9 C)   97.9 F (36.6 C)  TempSrc:      SpO2: 96%   96%  Weight:      Height:        Intake/Output Summary (Last 24 hours) at 08/08/2019 0911 Last data filed at 08/08/2019 0626 Gross per 24 hour  Intake 340 ml  Output 776 ml  Net -436 ml   Filed Weights   08/03/19 0315 08/03/19 1048  Weight: 72.6 kg 72.8 kg    Examination: General exam: Appears calm and comfortable  Respiratory system: Clear to auscultation. Respiratory effort normal. Cardiovascular system: S1 & S2 heard, RRR. Trace pedal edema. Ted hose in place  Gastrointestinal system: Abdomen is nondistended, soft and nontender. Normal bowel sounds heard. Central nervous system: Alert and oriented. Non focal exam. Speech clear  Extremities: Symmetric in appearance bilaterally  Skin: No rashes, lesions or ulcers on exposed skin  Psychiatry: Judgement and insight appear stable. Mood & affect appropriate.    Data Reviewed: I have personally reviewed following labs and imaging studies  CBC: Recent Labs  Lab 08/02/19 1623 08/03/19 0448 08/06/19 0954  08/07/19 0333 08/08/19 0402  WBC 11.7* 10.2 10.0 8.9 10.6*  NEUTROABS  --  6.5  --   --   --   HGB 10.6* 10.1* 10.0* 10.4* 10.4*  HCT 33.1* 32.5* 31.3* 31.8* 31.9*  MCV 94.8 96.4 94.3 93.0 92.5  PLT 191 172 205 217 562   Basic Metabolic Panel: Recent Labs  Lab 08/04/19 0342 08/05/19 0359 08/06/19 0313 08/07/19 0333 08/08/19 0402  NA 140 140 135 137 140  K 4.3 4.6 4.1 3.6 3.9  CL 108 107 109 104 103  CO2 24 21* 19* 22 23  GLUCOSE 122* 117* 123* 129* 129*  BUN 33* 29* 34* 43* 43*  CREATININE 2.59* 2.46* 2.44* 2.52* 2.67*  CALCIUM 8.3* 8.6* 8.1* 8.5* 8.9  MG 2.0 2.0  --   --   --    GFR: Estimated Creatinine Clearance: 17.8 mL/min (A) (by C-G formula based on SCr  of 2.67 mg/dL (H)). Liver Function Tests: Recent Labs  Lab 08/02/19 1623 08/03/19 0326 08/07/19 0333  AST 14* 20 21  ALT 12 10 21   ALKPHOS 57 50 72  BILITOT 0.7 1.5* 0.5  PROT 6.4* 5.8* 5.9*  ALBUMIN 3.8 3.4* 2.8*   Recent Labs  Lab 08/02/19 1623  LIPASE 36   Recent Labs  Lab 08/05/19 0359  AMMONIA 17   Coagulation Profile: No results for input(s): INR, PROTIME in the last 168 hours. Cardiac Enzymes: No results for input(s): CKTOTAL, CKMB, CKMBINDEX, TROPONINI in the last 168 hours. BNP (last 3 results) No results for input(s): PROBNP in the last 8760 hours. HbA1C: No results for input(s): HGBA1C in the last 72 hours. CBG: Recent Labs  Lab 08/06/19 2054 08/07/19 0726 08/07/19 1231 08/07/19 1539 08/07/19 2144  GLUCAP 159* 120* 279* 127* 128*   Lipid Profile: No results for input(s): CHOL, HDL, LDLCALC, TRIG, CHOLHDL, LDLDIRECT in the last 72 hours. Thyroid Function Tests: No results for input(s): TSH, T4TOTAL, FREET4, T3FREE, THYROIDAB in the last 72 hours. Anemia Panel: No results for input(s): VITAMINB12, FOLATE, FERRITIN, TIBC, IRON, RETICCTPCT in the last 72 hours. Sepsis Labs: Recent Labs  Lab 08/04/19 0639 08/04/19 0933 08/06/19 0313 08/07/19 0333  PROCALCITON 0.94  --   0.65 0.47  LATICACIDVEN 1.2 1.4  --   --     Recent Results (from the past 240 hour(s))  Respiratory Panel by RT PCR (Flu A&B, Covid) - Nasopharyngeal Swab     Status: None   Collection Time: 08/02/19  6:18 PM   Specimen: Nasopharyngeal Swab  Result Value Ref Range Status   SARS Coronavirus 2 by RT PCR NEGATIVE NEGATIVE Final    Comment: (NOTE) SARS-CoV-2 target nucleic acids are NOT DETECTED. The SARS-CoV-2 RNA is generally detectable in upper respiratoy specimens during the acute phase of infection. The lowest concentration of SARS-CoV-2 viral copies this assay can detect is 131 copies/mL. A negative result does not preclude SARS-Cov-2 infection and should not be used as the sole basis for treatment or other patient management decisions. A negative result may occur with  improper specimen collection/handling, submission of specimen other than nasopharyngeal swab, presence of viral mutation(s) within the areas targeted by this assay, and inadequate number of viral copies (<131 copies/mL). A negative result must be combined with clinical observations, patient history, and epidemiological information. The expected result is Negative. Fact Sheet for Patients:  PinkCheek.be Fact Sheet for Healthcare Providers:  GravelBags.it This test is not yet ap proved or cleared by the [place] FDA and  has been authorized for detection and/or diagnosis of SARS-CoV-2 by FDA under an Emergency Use Authorization (EUA). This EUA will remain  in effect (meaning this test can be used) for the duration of the COVID-19 declaration under Section 564(b)(1) of the Act, 21 U.S.C. section 360bbb-3(b)(1), unless the authorization is terminated or revoked sooner.    Influenza A by PCR NEGATIVE NEGATIVE Final   Influenza B by PCR NEGATIVE NEGATIVE Final    Comment: (NOTE) The Xpert Xpress SARS-CoV-2/FLU/RSV assay is intended as an aid in  the  diagnosis of influenza from Nasopharyngeal swab specimens and  should not be used as a sole basis for treatment. Nasal washings and  aspirates are unacceptable for Xpert Xpress SARS-CoV-2/FLU/RSV  testing. Fact Sheet for Patients: PinkCheek.be Fact Sheet for Healthcare Providers: GravelBags.it This test is not yet approved or cleared by the [place] FDA and  has been authorized for detection and/or diagnosis of SARS-CoV-2  by  FDA under an Emergency Use Authorization (EUA). This EUA will remain  in effect (meaning this test can be used) for the duration of the  Covid-19 declaration under Section 564(b)(1) of the Act, 21  U.S.C. section 360bbb-3(b)(1), unless the authorization is  terminated or revoked. Performed at [facility], [place] [address], [place], [place] [address]   Culture, blood (routine x 2) Call MD if unable to obtain prior to antibiotics being given     Status: None   Collection Time: 08/03/19  4:20 AM   Specimen: BLOOD  Result Value Ref Range Status   Specimen Description BLOOD RIGHT WRIST  Final   Special Requests   Final    BOTTLES DRAWN AEROBIC AND ANAEROBIC Blood Culture results may not be optimal due to an inadequate volume of blood received in culture bottles   Culture   Final    NO GROWTH 5 DAYS Performed at [facility], [place] [address], [place], [place] [address]    Report Status 08/08/2019 FINAL  Final  Culture, blood (Routine X 2) w Reflex to ID Panel     Status: None   Collection Time: 08/03/19 10:43 PM   Specimen: BLOOD RIGHT ARM  Result Value Ref Range Status   Specimen Description BLOOD RIGHT ARM  Final   Special Requests   Final    BOTTLES DRAWN AEROBIC AND ANAEROBIC Blood Culture adequate volume   Culture   Final    NO GROWTH 5 DAYS Performed at [facility], [place] [address], [place], [place] [address]    Report Status 08/08/2019 FINAL  Final      Radiology  Studies: No results found.    Scheduled Meds: . amLODipine  5 mg Oral Daily  . apixaban  2.5 mg Oral BID  . vitamin C  500 mg Oral Daily  . aspirin  81 mg Oral Daily  . atorvastatin  20 mg Oral Daily  . timolol  1 drop Left Eye BID   And  . brimonidine  1 drop Left Eye BID  . cholecalciferol  2,000 Units Oral Daily  . dorzolamide-timolol  1 drop Left Eye BID  . doxycycline  100 mg Oral Q12H  . febuxostat  40 mg Oral Once per day on Sun Tue Fri  . finasteride  5 mg Oral Daily  . furosemide  40 mg Oral BID  . hydrALAZINE  100 mg Oral Q8H  . insulin aspart  0-5 Units Subcutaneous QHS  . insulin aspart  0-9 Units Subcutaneous TID WC  . latanoprost  1 drop Both Eyes QHS  . metoprolol tartrate  12.5 mg Oral BID  . multivitamin  1 tablet Oral BID  . pantoprazole  40 mg Oral Daily  . sodium bicarbonate  1,300 mg Oral BID  . triamcinolone cream   Topical BID   Continuous Infusions: . amiodarone 30 mg/hr (08/08/19 0434)  . cefTRIAXone (ROCEPHIN)  IV 2 g (08/07/19 2154)     LOS: 6 days      Time spent: 30 minutes   Dessa Phi, DO [facility] 08/08/2019, 9:11 AM   Available via Epic secure chat 7am-7pm After these hours, please refer to coverage provider listed on [email or web address]

## 2019-08-08 NOTE — Progress Notes (Signed)
Notified On call physician regarding elevated B/p, Prn Hydralazine was given with little change after 30 minute recheck. Will cont to monitor.

## 2019-08-08 NOTE — Progress Notes (Signed)
Patient ID: Adam [name], male   DOB: Jan 19, 1931, 86 y.o.   MRN: [id number] S: Feels well today O:BP (!) 163/66 (BP Location: Right Arm)   Pulse 83   Temp 97.9 F (36.6 C)   Resp 18   Ht 5\' 4"  (1.626 m)   Wt 72.8 kg   SpO2 96%   BMI 27.55 kg/m   Intake/Output Summary (Last 24 hours) at 08/08/2019 1049 Last data filed at 08/08/2019 0626 Gross per 24 hour  Intake 340 ml  Output 776 ml  Net -436 ml   Intake/Output: I/O last 3 completed shifts: In: 340 [P.O.:240; IV Piggyback:100] Out: 5726 [Urine:1525; Stool:1]  Intake/Output this shift:  No intake/output data recorded. Weight change:  Gen: WD WN WM in NAD CVS: IRR IRR Resp: occ exp wheezes Abd: +BS, soft, NT/ND Ext: 1+ ankle edema  Recent Labs  Lab 08/02/19 1623 08/03/19 0326 08/04/19 0342 08/05/19 0359 08/06/19 0313 08/07/19 0333 08/08/19 0402  NA 138 137 140 140 135 137 140  K 4.5 5.5* 4.3 4.6 4.1 3.6 3.9  CL 106 105 108 107 109 104 103  CO2 21* 21* 24 21* 19* 22 23  GLUCOSE 121* 112* 122* 117* 123* 129* 129*  BUN 32* 33* 33* 29* 34* 43* 43*  CREATININE 2.45* 2.60* 2.59* 2.46* 2.44* 2.52* 2.67*  ALBUMIN 3.8 3.4*  --   --   --  2.8*  --   CALCIUM 8.9 8.6* 8.3* 8.6* 8.1* 8.5* 8.9  AST 14* 20  --   --   --  21  --   ALT 12 10  --   --   --  21  --    Liver Function Tests: Recent Labs  Lab 08/02/19 1623 08/03/19 0326 08/07/19 0333  AST 14* 20 21  ALT 12 10 21   ALKPHOS 57 50 72  BILITOT 0.7 1.5* 0.5  PROT 6.4* 5.8* 5.9*  ALBUMIN 3.8 3.4* 2.8*   Recent Labs  Lab 08/02/19 1623  LIPASE 36   Recent Labs  Lab 08/05/19 0359  AMMONIA 17   CBC: Recent Labs  Lab 08/02/19 1623 08/02/19 1623 08/03/19 0448 08/03/19 0448 08/06/19 0954 08/07/19 0333 08/08/19 0402  WBC 11.7*   < > 10.2   < > 10.0 8.9 10.6*  NEUTROABS  --   --  6.5  --   --   --   --   HGB 10.6*   < > 10.1*   < > 10.0* 10.4* 10.4*  HCT 33.1*   < > 32.5*   < > 31.3* 31.8* 31.9*  MCV 94.8  --  96.4  --  94.3 93.0 92.5  PLT 191   < > 172    < > 205 217 231   < > = values in this interval not displayed.   Cardiac Enzymes: No results for input(s): CKTOTAL, CKMB, CKMBINDEX, TROPONINI in the last 168 hours. CBG: Recent Labs  Lab 08/06/19 2054 08/07/19 0726 08/07/19 1231 08/07/19 1539 08/07/19 2144  GLUCAP 159* 120* 279* 127* 128*    Iron Studies: No results for input(s): IRON, TIBC, TRANSFERRIN, FERRITIN in the last 72 hours. Studies/Results: No results found. [place] amLODipine  5 mg Oral Daily  . apixaban  2.5 mg Oral BID  . vitamin C  500 mg Oral Daily  . aspirin  81 mg Oral Daily  . atorvastatin  20 mg Oral Daily  . timolol  1 drop Left Eye BID   And  . brimonidine  1 drop Left  Eye BID  . carvedilol  6.25 mg Oral BID WC  . cholecalciferol  2,000 Units Oral Daily  . dorzolamide-timolol  1 drop Left Eye BID  . doxycycline  100 mg Oral Q12H  . febuxostat  40 mg Oral Once per day on Sun Tue Fri  . finasteride  5 mg Oral Daily  . furosemide  40 mg Oral BID  . hydrALAZINE  100 mg Oral Q8H  . insulin aspart  0-5 Units Subcutaneous QHS  . insulin aspart  0-9 Units Subcutaneous TID WC  . ipratropium  0.5 mg Nebulization Q6H  . latanoprost  1 drop Both Eyes QHS  . multivitamin  1 tablet Oral BID  . pantoprazole  40 mg Oral Daily  . sodium bicarbonate  1,300 mg Oral BID  . triamcinolone cream   Topical BID    BMET    Component Value Date/Time   NA 140 08/08/2019 0402   NA 138 02/01/2012 0920   K 3.9 08/08/2019 0402   K 4.6 02/01/2012 0920   CL 103 08/08/2019 0402   CL 113 (H) 02/01/2012 0920   CO2 23 08/08/2019 0402   CO2 21 (L) 02/01/2012 0920   GLUCOSE 129 (H) 08/08/2019 0402   GLUCOSE 130 (H) 02/01/2012 0920   BUN 43 (H) 08/08/2019 0402   BUN 28.0 (H) 02/01/2012 0920   CREATININE 2.67 (H) 08/08/2019 0402   CREATININE 1.64 (H) 12/15/2015 1025   CREATININE 1.4 (H) 02/01/2012 0920   CALCIUM 8.9 08/08/2019 0402   CALCIUM 9.5 02/01/2012 0920   GFRNONAA 21 (L) 08/08/2019 0402   GFRAA 24 (L) 08/08/2019 0402    CBC    Component Value Date/Time   WBC 10.6 (H) 08/08/2019 0402   RBC 3.45 (L) 08/08/2019 0402   HGB 10.4 (L) 08/08/2019 0402   HGB 9.8 (L) 02/01/2012 0920   HCT 31.9 (L) 08/08/2019 0402   HCT 28.0 (L) 02/01/2012 0920   PLT 231 08/08/2019 0402   PLT 160 02/01/2012 0920   MCV 92.5 08/08/2019 0402   MCV 102.0 (H) 02/01/2012 0920   MCH 30.1 08/08/2019 0402   MCHC 32.6 08/08/2019 0402   RDW 13.9 08/08/2019 0402   RDW 14.4 02/01/2012 0920   LYMPHSABS 1.7 08/03/2019 0448   LYMPHSABS 0.9 02/01/2012 0920   MONOABS 1.7 (H) 08/03/2019 0448   MONOABS 0.4 02/01/2012 0920   EOSABS 0.1 08/03/2019 0448   EOSABS 0.2 02/01/2012 0920   BASOSABS 0.1 08/03/2019 0448   BASOSABS 0.1 02/01/2012 0920     Assessment/Plan:  1. AKI/CKD stage IIIb-IV (baseline Scr 1.7-2)- in setting of CAP, a fib with RVR and some volume overload and IV diuresis.  IV lasix stopped yesterday and started on po lasix today due to rising BUN/Cr.   2. Multifocal pneumonia- per primary svc.  WBC climbing. 3. Paroxysmal atrial fibrillation with RVR- EP and Cardiology following 4. Sick Sinus syndrome- per EP.  May require pacemaker 5. Aortic stenosis- moderate on ECHO 6. HTN- BP elevated and amlodipine 5 mg added, hydralazine 100mg  tid and metoprolol switched to carvedilol 7. Acute hypoxic respiratory distress initially thought to be due to acute on chronic diastolic CHF (ECHO with EF 60-65% and grade II DD), and given multiple doses of IV lasix.  He has responded more to atrovent nebulizers.  Given recurrence of wheezing, will order atrovent q6 and not prn as he is not able to ask for it given his underlying dementia. 8. Dementia- stable  Donetta Potts, MD [facility] (  [phone]  

## 2019-08-09 ENCOUNTER — Other Ambulatory Visit: Payer: Self-pay | Admitting: Cardiology

## 2019-08-09 ENCOUNTER — Inpatient Hospital Stay (HOSPITAL_COMMUNITY): Payer: Medicare Other

## 2019-08-09 DIAGNOSIS — I480 Paroxysmal atrial fibrillation: Secondary | ICD-10-CM

## 2019-08-09 DIAGNOSIS — I495 Sick sinus syndrome: Secondary | ICD-10-CM

## 2019-08-09 LAB — CBC
HCT: 30.7 % — ABNORMAL LOW (ref 39.0–52.0)
Hemoglobin: 10.2 g/dL — ABNORMAL LOW (ref 13.0–17.0)
MCH: 30.3 pg (ref 26.0–34.0)
MCHC: 33.2 g/dL (ref 30.0–36.0)
MCV: 91.1 fL (ref 80.0–100.0)
Platelets: 252 10*3/uL (ref 150–400)
RBC: 3.37 MIL/uL — ABNORMAL LOW (ref 4.22–5.81)
RDW: 13.7 % (ref 11.5–15.5)
WBC: 9.3 10*3/uL (ref 4.0–10.5)
nRBC: 0 % (ref 0.0–0.2)

## 2019-08-09 LAB — BASIC METABOLIC PANEL
Anion gap: 14 (ref 5–15)
BUN: 44 mg/dL — ABNORMAL HIGH (ref 8–23)
CO2: 22 mmol/L (ref 22–32)
Calcium: 8.5 mg/dL — ABNORMAL LOW (ref 8.9–10.3)
Chloride: 102 mmol/L (ref 98–111)
Creatinine, Ser: 2.49 mg/dL — ABNORMAL HIGH (ref 0.61–1.24)
GFR calc Af Amer: 26 mL/min — ABNORMAL LOW (ref >60)
GFR calc non Af Amer: 22 mL/min — ABNORMAL LOW (ref >60)
Glucose, Bld: 113 mg/dL — ABNORMAL HIGH (ref 70–99)
Potassium: 3 mmol/L — ABNORMAL LOW (ref 3.5–5.1)
Sodium: 138 mmol/L (ref 135–145)

## 2019-08-09 LAB — GLUCOSE, CAPILLARY
Glucose-Capillary: 106 mg/dL — ABNORMAL HIGH (ref 70–99)
Glucose-Capillary: 219 mg/dL — ABNORMAL HIGH (ref 70–99)

## 2019-08-09 MED ORDER — APIXABAN 2.5 MG PO TABS: 2.5000 mg | ORAL_TABLET | Freq: Two times a day (BID) | ORAL | 0 refills | Status: AC

## 2019-08-09 MED ORDER — FUROSEMIDE 40 MG PO TABS: 40.0000 mg | ORAL_TABLET | Freq: Two times a day (BID) | ORAL | 0 refills | Status: DC

## 2019-08-09 MED ORDER — HYDRALAZINE HCL 100 MG PO TABS: 100.0000 mg | ORAL_TABLET | Freq: Three times a day (TID) | ORAL | 0 refills | Status: AC

## 2019-08-09 MED ORDER — AMLODIPINE BESYLATE 5 MG PO TABS: 5.0000 mg | ORAL_TABLET | Freq: Every day | ORAL | 0 refills | Status: DC

## 2019-08-09 MED ORDER — AMIODARONE HCL 200 MG PO TABS
200.0000 mg | ORAL_TABLET | Freq: Two times a day (BID) | ORAL | 0 refills | Status: DC
Start: 2019-08-09 — End: 2019-09-08

## 2019-08-09 MED ORDER — DOXYCYCLINE HYCLATE 100 MG PO TABS: 100.0000 mg | ORAL_TABLET | Freq: Two times a day (BID) | ORAL | 0 refills | Status: AC

## 2019-08-09 MED ORDER — CARVEDILOL 6.25 MG PO TABS: 6.2500 mg | ORAL_TABLET | Freq: Two times a day (BID) | ORAL | 0 refills | Status: DC

## 2019-08-09 MED ORDER — AMIODARONE HCL 200 MG PO TABS
200.0000 mg | ORAL_TABLET | Freq: Once | ORAL | Status: AC
  Administered 2019-08-09: 200 mg via ORAL
  Filled 2019-08-09: qty 1

## 2019-08-09 MED ORDER — POTASSIUM CHLORIDE CRYS ER 20 MEQ PO TBCR
40.0000 meq | EXTENDED_RELEASE_TABLET | Freq: Once | ORAL | Status: AC
  Administered 2019-08-09: 40 meq via ORAL
  Filled 2019-08-09: qty 2

## 2019-08-09 MED ORDER — AMIODARONE HCL 200 MG PO TABS: 200.0000 mg | ORAL_TABLET | Freq: Every day | ORAL | Status: DC

## 2019-08-09 NOTE — Progress Notes (Signed)
SATURATION QUALIFICATIONS: (This note is used to comply with regulatory documentation for home oxygen)  Patient Saturations on Room Air at Rest = 96%  Patient Saturations on Room Air while Ambulating = 97%  Patient Saturations on N/A Liters of oxygen while Ambulating = N/A%  Please briefly explain why patient needs home oxygen: Pt does not need supplemental O2.   [place] Pager [phone] Office [phone]

## 2019-08-09 NOTE — TOC Transition Note (Signed)
Transition of Care [facility]) - CM/SW Discharge Note   Patient Details  Name: Myron Stankovich MRN: [id number] Date of Birth: [birth year]  Transition of Care [facility] CM/SW Contact:  Carles Collet, RN Phone Number: 08/09/2019, 1:06 PM   Clinical Narrative:   Damaris Schooner w patient and two daughter at bedside. Discussed dispo plan. Patient will go back to [place] with [facility] services. Discussed [place] providers and programs. Family would like to try [facility] w [facility]. Referral for [facility] services accepted by [facility]. No DME needs.     Final next level of care: [place] Barriers to Discharge: No Barriers Identified   Patient Goals and CMS Choice Patient states their goals for this hospitalization and ongoing recovery are:: to go home CMS Medicare.gov Compare Post Acute Care list provided to:: Patient Choice offered to / list presented to : Adult Children  Discharge Placement                       Discharge Plan and Services                          HH Arranged: PT, RN, OT [facility] Agency: [place] Date [facility] Agency Contacted: 08/09/19 Time [place]: [id number] Representative spoke with at [place]: [place] ([place]) Interventions     Readmission Risk Interventions No flowsheet data found.

## 2019-08-09 NOTE — Discharge Summary (Addendum)
Physician Discharge Summary  Adam [name] HGD:[id number] DOB: [birth year] DOA: 08/02/2019  PCP: Javier Glazier, MD  Admit date: 08/02/2019 Discharge date: 08/09/2019  Admitted From: ILF Disposition:  ILF  Recommendations for Outpatient Follow-up:  1. Follow up with PCP in 1 week 2. Follow up with Dr. [name] in 3-4 weeks 3. Follow up with [facility]  4. Repeat BMP in 3-5 days  Home Health: PT OT RN    Discharge Condition: Stable CODE STATUS: DNR  Diet recommendation:  Diet Orders (From admission, onward)    Start     Ordered   08/09/19 0000  Diet - low sodium heart healthy     08/09/19 1201   08/03/19 0326  Diet Heart Room service appropriate? Yes; Fluid consistency: Thin  Diet effective now    Question Answer Comment  Room service appropriate? Yes   Fluid consistency: Thin      08/03/19 0325         Brief/Interim Summary: Adam [name] is an 86 year old male with past medical history significant for hyperlipidemia, hypertension who presented to the hospital on 5/2 due to back pain, abdominal pain and shortness of breath.  Work-up in the emergency department revealed multifocal pneumonia and patient was admitted and started on IV antibiotics.  On 5/3, patient went into A. fib RVR and was started on IV Cardizem, IV heparin.  Cardiology was consulted at that time.  Also had postconversion pauses seen on telemetry.  Nephrology also following for acute kidney injury. Due to continued A fib RVR, he was switched to IV amiodarone. He made slow clinical improvements and weaned off O2 to room air prior to discharge home.   Discharge Diagnoses:  Principal Problem:   CAP (community acquired pneumonia) Active Problems:   HTN (hypertension)   HLD (hyperlipidemia)   Type 2 diabetes mellitus (HCC)   BPH (benign prostatic hyperplasia)   Leucocytosis   ARF (acute renal failure) (HCC)   Hypoxia   Atrial fibrillation, new onset (HCC)   Atrial fibrillation with RVR  (HCC)   Multifocal pneumonia -Chest x-ray showed mild hazy infrahilar opacification right worse than left -CT chest revealed small bilateral pleural effusion, concern for multifocal pneumonia -Blood cultures negative to date -SARS-CoV-2, influenza negative -Doxycycline, Rocephin x 7 days   A. fib RVR -With postconversion pauses and bradycardia -Eliquis -Continue coreg.  IV amiodarone --> p.o. amiodarone -Appreciate cardiology, EP   Acute hypoxemic respiratory failure secondary to acute on chronic diastolic heart failure -Chest x-ray obtained 5/6, independently reviewed, increased pulmonary congestion -PO Lasix -On room air this morning  Acute kidney injury on CKD stage IIIb -Baseline creatinine 1.7 -Renal ultrasound without hydronephrosis, increased renal parenchymal echogenicity and parenchymal atrophy consistent with chronic kidney disease -Appreciate nephrology  Elevated troponin -Flat trend, 20 --> 18 -Likely demand ischemia secondary to above  Type 2 diabetes, well controlled -Hemoglobin A1c 5.7  Hypertension -Continue hydralazine, coreg, Norvasc  Hyperlipidemia -Continue Lipitor  BPH -Continue Proscar    Discharge Instructions  Discharge Instructions    (HEART FAILURE PATIENTS) Call MD:  Anytime you have any of the following symptoms: 1) 3 pound weight gain in 24 hours or 5 pounds in 1 week 2) shortness of breath, with or without a dry hacking cough 3) swelling in the hands, feet or stomach 4) if you have to sleep on extra pillows at night in order to breathe.   Complete by: As directed    Call MD for:  difficulty breathing, headache or visual disturbances  Complete by: As directed    Call MD for:  extreme fatigue   Complete by: As directed    Call MD for:  persistant dizziness or light-headedness   Complete by: As directed    Call MD for:  persistant nausea and vomiting   Complete by: As directed    Call MD for:  severe uncontrolled pain    Complete by: As directed    Call MD for:  temperature >100.4   Complete by: As directed    Diet - low sodium heart healthy   Complete by: As directed    Discharge instructions   Complete by: As directed    You were cared for by a hospitalist during your hospital stay. If you have any questions about your discharge medications or the care you received while you were in the hospital after you are discharged, you can call the unit and ask to speak with the hospitalist on call if the hospitalist that took care of you is not available. Once you are discharged, your primary care physician will handle any further medical issues. Please note that NO REFILLS for any discharge medications will be authorized once you are discharged, as it is imperative that you return to your primary care physician (or establish a relationship with a primary care physician if you do not have one) for your aftercare needs so that they can reassess your need for medications and monitor your lab values.   Increase activity slowly   Complete by: As directed      Allergies as of 08/09/2019   No Known Allergies     Medication List    STOP taking these medications   aspirin EC 81 MG tablet   labetalol 100 MG tablet Commonly known as: NORMODYNE     TAKE these medications   amiodarone 200 MG tablet Commonly known as: Pacerone Take 1 tablet (200 mg total) by mouth 2 (two) times daily.   amLODipine 5 MG tablet Commonly known as: NORVASC Take 1 tablet (5 mg total) by mouth daily. Start taking on: Aug 10, 2019   apixaban 2.5 MG Tabs tablet Commonly known as: ELIQUIS Take 1 tablet (2.5 mg total) by mouth 2 (two) times daily.   atorvastatin 20 MG tablet Commonly known as: LIPITOR Take 1 tablet (20 mg total) by mouth daily.   betamethasone dipropionate 0.05 % ointment Commonly known as: DIPROLENE Apply topically daily.   carvedilol 6.25 MG tablet Commonly known as: COREG Take 1 tablet (6.25 mg total) by mouth 2  (two) times daily with a meal.   clobetasol cream 0.05 % Commonly known as: TEMOVATE Apply 1 application topically every evening.   Combigan 0.2-0.5 % ophthalmic solution Generic drug: brimonidine-timolol Place 1 drop into the left eye 2 (two) times daily.   doxycycline 100 MG tablet Commonly known as: VIBRA-TABS Take 1 tablet (100 mg total) by mouth every 12 (twelve) hours for 1 dose. At 10pm 08/09/19   finasteride 5 MG tablet Commonly known as: PROSCAR Take 5 mg by mouth daily.   furosemide 40 MG tablet Commonly known as: LASIX Take 1 tablet (40 mg total) by mouth 2 (two) times daily. What changed:   medication strength  how much to take  when to take this   hydrALAZINE 100 MG tablet Commonly known as: APRESOLINE Take 1 tablet (100 mg total) by mouth every 8 (eight) hours. What changed:   medication strength  how much to take  when to take this   omeprazole  20 MG tablet Commonly known as: PRILOSEC OTC Take 20 mg by mouth daily.   PRESERVISION AREDS PO Take 1 tablet by mouth 2 (two) times daily.   Rocklatan 0.02-0.005 % Soln Generic drug: Netarsudil-Latanoprost Place 1 drop into the left eye at bedtime.   Uloric 40 MG tablet Generic drug: febuxostat Take 40 mg by mouth See admin instructions. on Tuesday, Friday, and Sunday      Follow-up Information    [facility] Follow up.   Specialty: Cardiology Why: the office will call to arrange the monitor Contact information: [address], [address] [place]       Fay Records, MD Follow up.   Specialty: Cardiology Why: the office should call you to arrange follow up with Dr. [name] in 3-4 weeks after monitor if you have not heard by Wed. call the office.   Contact information: [place] [address] [phone]        Javier Glazier, MD. Schedule an appointment as soon as possible for a visit in 1  week(s).   Specialty: Internal Medicine Contact information: [place] [address] [phone]        [facility]. Schedule an appointment as soon as possible for a visit in 1 week(s).   Specialty: Nephrology Contact information: [address] [place] [address] [phone]        [facility] Follow up.   Specialty: Home Health Services Why: For home health services. They will call in the next 1-2 days to set up your first appointment Contact information: [place] [address] [phone]          No Known Allergies  Consultations:  Cardiology  EP  Nephrology   Procedures/Studies: CT Chest Wo Contrast  Result Date: 08/02/2019 CLINICAL DATA:  86 year old male with chest pain and back pain. EXAM: CT CHEST WITHOUT CONTRAST TECHNIQUE: Multidetector CT imaging of the chest was performed following the standard protocol without IV contrast. COMPARISON:  Chest radiograph dated 08/02/2019. FINDINGS: Evaluation of this exam is limited in the absence of intravenous contrast. Cardiovascular: Borderline cardiomegaly. Advanced 3 vessel coronary vascular calcification. There is trace pericardial effusion measuring up to 7 mm in thickness anterior to the heart. There is atherosclerotic calcification of the aortic valve annulus. Moderate atherosclerotic calcification of the aorta. The central pulmonary arteries are grossly unremarkable. Mediastinum/Nodes: No hilar or mediastinal adenopathy. There is a small hiatal hernia. The esophagus and the thyroid gland are grossly unremarkable. No mediastinal fluid collection. Lungs/Pleura: Small bilateral pleural effusions, right greater left. There are bibasilar linear and streaky densities which may represent atelectasis or infiltrate. Additionally scattered areas of ground-glass nodularity noted throughout the lungs also concerning for an  infectious process. Clinical correlation and follow-up to resolution recommended. There is no pneumothorax. The central airways are patent. Upper Abdomen: No acute abnormality. Musculoskeletal: Degenerative changes of the spine. No acute osseous pathology. IMPRESSION: 1. Small bilateral pleural effusions with findings concerning for multifocal pneumonia. Clinical correlation and follow-up recommended. 2. Advanced 3 vessel coronary vascular calcification and Aortic Atherosclerosis (ICD10-I70.0). Electronically Signed   By: Anner Crete M.D.   On: 08/02/2019 20:13   US RENAL  Result Date: 08/04/2019 CLINICAL DATA:  86 year old male with acute renal insufficiency. EXAM: RENAL / URINARY TRACT ULTRASOUND COMPLETE COMPARISON:  None. FINDINGS: Right Kidney: Renal measurements: 9.9 x 5.0 x 4.2  cm = volume: 109 mL. There is moderate parenchyma atrophy and cortical thinning. Mild increased echogenicity. There is a 2 cm interpolar cyst. No hydronephrosis or shadowing stone. Left Kidney: Renal measurements: 8.5 x 5.1 x 3.9 cm = volume: 87 mL. Moderate parenchyma atrophy and cortical thinning. Mild increased echogenicity. No hydronephrosis or shadowing stone. Bladder: Appears normal for degree of bladder distention. Other: None. IMPRESSION: 1. No hydronephrosis or shadowing stone. 2. Increased renal parenchymal echogenicity and parenchymal atrophy in keeping with chronic kidney disease. Electronically Signed   By: Anner Crete M.D.   On: 08/04/2019 19:11   DG CHEST PORT 1 VIEW  Result Date: 08/09/2019 CLINICAL DATA:  Pneumonia EXAM: PORTABLE CHEST 1 VIEW COMPARISON:  Aug 06, 2019 FINDINGS: There is a new area of apparent infiltrate in the left mid lung peripherally. In comparison with 3 days prior, there is less ill-defined opacity in the right perihilar region currently. Slight ill-defined opacity remains in the right base and right perihilar regions. There is atelectatic change in the medial left base. Heart is  borderline enlarged with pulmonary vascularity normal. No adenopathy. There is aortic atherosclerosis. No bone lesions. IMPRESSION: New area of apparent pneumonia left mid lung. Somewhat less opacity in the right perihilar and right base regions consistent with partial but incomplete clearing of infiltrate from these areas. There is medial left base atelectasis. Stable cardiac prominence. Electronically Signed   By: Lowella Grip III M.D.   On: 08/09/2019 11:09   DG CHEST PORT 1 VIEW  Result Date: 08/06/2019 CLINICAL DATA:  Shortness of breath EXAM: PORTABLE CHEST 1 VIEW COMPARISON:  CT 08/02/2019 FINDINGS: Asymmetrically increased opacity in the right hemithorax likely reflecting layering effusion on this portable supine film. Small left effusion noted as well. Patchy areas of consolidation throughout the lungs, increased from most recent comparison. No visible pneumothorax. Cardiomediastinal contours are stable with a calcified aorta. Telemetry leads overlie the chest. No acute osseous or soft tissue abnormality. Degenerative changes are present in the imaged spine and shoulders. IMPRESSION: 1. Bilateral pleural effusions, right greater than left. 2. Patchy areas of consolidation throughout the lungs, increased from most recent comparison, likely acute infection or inflammation. Electronically Signed   By: Lovena Le M.D.   On: 08/06/2019 02:14   DG Chest Port 1 View  Result Date: 08/02/2019 CLINICAL DATA:  Abdominal, back and shoulder pain. Shortness of breath. EXAM: PORTABLE CHEST 1 VIEW COMPARISON:  06/28/2016 FINDINGS: Lungs are adequately inflated with subtle hazy infrahilar opacification right worse than left which may be due to vascular congestion less likely infection. Borderline cardiomegaly. No effusion. Remainder the exam is unchanged. IMPRESSION: 1. Mild hazy infrahilar opacification right worse than left likely due to vascular congestion although infection is possible. 2.  Borderline  cardiomegaly. Electronically Signed   By: Marin Olp M.D.   On: 08/02/2019 15:51   DG Foot Complete Right  Result Date: 08/04/2019 CLINICAL DATA:  Right foot pain with swelling EXAM: RIGHT FOOT COMPLETE - 3+ VIEW COMPARISON:  12/25/2009 FINDINGS: No fracture or malalignment. Vascular calcifications. Degenerative changes at the first MTP joint. Arthritis at the second through fifth IP joints. Moderate plantar calcaneal spur. IMPRESSION: 1. No acute osseous abnormality 2. Arthritis at the first MTP joint and IP joints. Electronically Signed   By: Donavan Foil M.D.   On: 08/04/2019 18:39   ECHOCARDIOGRAM COMPLETE  Result Date: 08/04/2019    ECHOCARDIOGRAM REPORT   Patient Name:   Adam [name] Date of Exam: 08/04/2019 Medical Rec #:  [id number]   Height:       64.0 in Accession #:    [id number]  Weight:       160.5 lb Date of Birth:  [birth year]    BSA:          1.782 m Patient Age:    86 years    BP:           157/81 mmHg Patient Gender: M           HR:           84 bpm. Exam Location:  Inpatient Procedure: 2D Echo, Cardiac Doppler and Color Doppler Indications:    I48.91* Unspeicified atrial fibrillation  History:        Patient has prior history of Echocardiogram examinations, most                 recent 05/01/2018. Abnormal ECG, Aortic Valve Disease,                 Arrythmias:PAC, Signs/Symptoms:Altered Mental Status, Murmur and                 Hypotension; Risk Factors:Hypertension, Dyslipidemia and                 Diabetes. Hypoxia.  Sonographer:    Roseanna Rainbow RDCS Referring Phys: [id number] SEYED A SHAHMEHDI  Sonographer Comments: Technically difficult study due to poor echo windows and suboptimal parasternal window. IMPRESSIONS  1. Left ventricular ejection fraction, by estimation, is 60 to 65%. The left ventricle has normal function. The left ventricle has no regional wall motion abnormalities. Left ventricular diastolic parameters are consistent with Grade II diastolic dysfunction (pseudonormalization).  2.  Right ventricular systolic function is normal. The right ventricular size is normal.  3. Left atrial size was mild to moderately dilated.  4. Right atrial size was mildly dilated.  5. The mitral valve is grossly normal. Mild mitral valve regurgitation. No evidence of mitral stenosis.  6. The aortic valve is abnormal. Aortic valve regurgitation is trivial. Moderate aortic valve stenosis.  7. The inferior vena cava is dilated in size with >50% respiratory variability, suggesting right atrial pressure of 8 mmHg. Conclusion(s)/Recommendation(s): Frequent ectopic beats throughout study. FINDINGS  Left Ventricle: Left ventricular ejection fraction, by estimation, is 60 to 65%. The left ventricle has normal function. The left ventricle has no regional wall motion abnormalities. The left ventricular internal cavity size was normal in size. There is  no left ventricular hypertrophy. Left ventricular diastolic parameters are consistent with Grade II diastolic dysfunction (pseudonormalization). Right Ventricle: The right ventricular size is normal. No increase in right ventricular wall thickness. Right ventricular systolic function is normal. Left Atrium: Left atrial size was mild to moderately dilated. Right Atrium: Right atrial size was mildly dilated. Pericardium: There is no evidence of pericardial effusion. Mitral Valve: The mitral valve is grossly normal. Mild mitral valve regurgitation. No evidence of mitral valve stenosis. Tricuspid Valve: The tricuspid valve is normal in structure. Tricuspid valve regurgitation is trivial. No evidence of tricuspid stenosis. Aortic Valve: The aortic valve is abnormal. . There is severe thickening and moderate calcification of the aortic valve. Aortic valve regurgitation is trivial. Moderate aortic stenosis is present. There is severe thickening of the aortic valve. There is moderate calcification of the aortic valve. Aortic valve mean gradient measures 30.8 mmHg. Aortic valve peak  gradient measures 51.2 mmHg. Aortic valve area, by VTI measures 0.75 cm. Pulmonic Valve: The pulmonic valve was not well visualized. Pulmonic  valve regurgitation is not visualized. No evidence of pulmonic stenosis. Aorta: The aortic root and ascending aorta are structurally normal, with no evidence of dilitation. Pulmonary Artery: The pulmonary artery is not well seen. Venous: The inferior vena cava is dilated in size with greater than 50% respiratory variability, suggesting right atrial pressure of 8 mmHg. IAS/Shunts: No atrial level shunt detected by color flow Doppler.  LEFT VENTRICLE PLAX 2D LVIDd:         4.50 cm     Diastology LVIDs:         3.00 cm     LV e' lateral:   8.00 cm/s LV PW:         1.20 cm     LV E/e' lateral: 16.6 LV IVS:        1.00 cm     LV e' medial:    10.00 cm/s LVOT diam:     1.90 cm     LV E/e' medial:  13.3 LV SV:         61 LV SV Index:   34 LVOT Area:     2.84 cm  LV Volumes (MOD) LV vol d, MOD A2C: 81.7 ml LV vol d, MOD A4C: 86.2 ml LV vol s, MOD A2C: 29.1 ml LV vol s, MOD A4C: 21.1 ml LV SV MOD A2C:     52.6 ml LV SV MOD A4C:     86.2 ml LV SV MOD BP:      60.5 ml RIGHT VENTRICLE         IVC TAPSE (M-mode): 2.1 cm  IVC diam: 2.40 cm LEFT ATRIUM             Index       RIGHT ATRIUM           Index LA diam:        3.90 cm 2.19 cm/m  RA Area:     18.80 cm LA Vol (A2C):   63.9 ml 35.86 ml/m RA Volume:   49.20 ml  27.61 ml/m LA Vol (A4C):   51.9 ml 29.13 ml/m LA Biplane Vol: 57.8 ml 32.44 ml/m  AORTIC VALVE AV Area (Vmax):    0.84 cm AV Area (Vmean):   0.75 cm AV Area (VTI):     0.75 cm AV Vmax:           357.80 cm/s AV Vmean:          261.800 cm/s AV VTI:            0.809 m AV Peak Grad:      51.2 mmHg AV Mean Grad:      30.8 mmHg LVOT Vmax:         106.00 cm/s LVOT Vmean:        69.100 cm/s LVOT VTI:          0.214 m LVOT/AV VTI ratio: 0.26  AORTA Ao Root diam: 3.40 cm MITRAL VALVE MV Area (PHT): 3.37 cm     SHUNTS MV Decel Time: 225 msec     Systemic VTI:  0.21 m MV E  velocity: 133.00 cm/s  Systemic Diam: 1.90 cm MV A velocity: 88.80 cm/s MV E/A ratio:  1.50 Buford Dresser MD Electronically signed by Buford Dresser MD Signature Date/Time: 08/04/2019/8:33:31 PM    Final    Intravitreal Injection, Pharmacologic Agent - OD - Right Eye  Result Date: 07/23/2019 Time Out 07/23/2019. 10:52 AM. Confirmed correct patient, procedure, site, and patient consented. Anesthesia Topical anesthesia was  used. Anesthetic medications included Akten 3.5%. Procedure Preparation included 10% betadine to eyelids. A 30 gauge needle was used. Injection: 1.25 mg Bevacizumab (AVASTIN) SOLN   NDC: [address]-8   Route: Intravitreal, Site: Right Eye, Waste: 0 mg Post-op Post injection exam found visual acuity of at least counting fingers. The patient tolerated the procedure well. There were no complications. The patient received written and verbal post procedure care education. Post injection medications were not given.   OCT, Retina - OU - Both Eyes  Result Date: 07/23/2019 Right Eye Quality was good. Scan locations included subfoveal. Central Foveal Thickness: 349. Progression has improved. Findings include retinal drusen , abnormal foveal contour, subretinal scarring, cystoid macular edema, macular pucker, outer retinal atrophy, central retinal atrophy, choroidal neovascular membrane. Left Eye Quality was good. Scan locations included subfoveal. Central Foveal Thickness: 265. Progression has improved. Findings include abnormal foveal contour, outer retinal atrophy, central retinal atrophy, retinal drusen , choroidal neovascular membrane. Notes OD, stable overall.  Some foveal atrophy some perifoveal CME is chronic, will repeat Avastin OD today      Discharge Exam: Vitals:   08/09/19 0716 08/09/19 0813  BP: (!) 175/67   Pulse: (!) 119   Resp: 19   Temp: 98.4 F (36.9 C)   SpO2: 97% 97%    General exam: Appears calm and comfortable  Respiratory system: Clear to  auscultation. Respiratory effort normal. Cardiovascular system: S1 & S2 heard, irregular rhythm, rate 110.  Trace pedal edema. Gastrointestinal system: Abdomen is nondistended, soft and nontender. Normal bowel sounds heard. Central nervous system: Alert and oriented. Non focal exam. Speech clear  Extremities: Symmetric in appearance bilaterally  Skin: No rashes, lesions or ulcers on exposed skin  Psychiatry: Judgement and insight appear stable. Mood & affect appropriate.    The results of significant diagnostics from this hospitalization (including imaging, microbiology, ancillary and laboratory) are listed below for reference.     Microbiology: Recent Results (from the past 240 hour(s))  Respiratory Panel by RT PCR (Flu A&B, Covid) - Nasopharyngeal Swab     Status: None   Collection Time: 08/02/19  6:18 PM   Specimen: Nasopharyngeal Swab  Result Value Ref Range Status   SARS Coronavirus 2 by RT PCR NEGATIVE NEGATIVE Final    Comment: (NOTE) SARS-CoV-2 target nucleic acids are NOT DETECTED. The SARS-CoV-2 RNA is generally detectable in upper respiratoy specimens during the acute phase of infection. The lowest concentration of SARS-CoV-2 viral copies this assay can detect is 131 copies/mL. A negative result does not preclude SARS-Cov-2 infection and should not be used as the sole basis for treatment or other patient management decisions. A negative result may occur with  improper specimen collection/handling, submission of specimen other than nasopharyngeal swab, presence of viral mutation(s) within the areas targeted by this assay, and inadequate number of viral copies (<131 copies/mL). A negative result must be combined with clinical observations, patient history, and epidemiological information. The expected result is Negative. Fact Sheet for Patients:  PinkCheek.be Fact Sheet for Healthcare Providers:  GravelBags.it This  test is not yet ap proved or cleared by the [place] FDA and  has been authorized for detection and/or diagnosis of SARS-CoV-2 by FDA under an Emergency Use Authorization (EUA). This EUA will remain  in effect (meaning this test can be used) for the duration of the COVID-19 declaration under Section 564(b)(1) of the Act, 21 U.S.C. section 360bbb-3(b)(1), unless the authorization is terminated or revoked sooner.    Influenza A by PCR NEGATIVE NEGATIVE  Final   Influenza B by PCR NEGATIVE NEGATIVE Final    Comment: (NOTE) The Xpert Xpress SARS-CoV-2/FLU/RSV assay is intended as an aid in  the diagnosis of influenza from Nasopharyngeal swab specimens and  should not be used as a sole basis for treatment. Nasal washings and  aspirates are unacceptable for Xpert Xpress SARS-CoV-2/FLU/RSV  testing. Fact Sheet for Patients: PinkCheek.be Fact Sheet for Healthcare Providers: GravelBags.it This test is not yet approved or cleared by the [place] FDA and  has been authorized for detection and/or diagnosis of SARS-CoV-2 by  FDA under an Emergency Use Authorization (EUA). This EUA will remain  in effect (meaning this test can be used) for the duration of the  Covid-19 declaration under Section 564(b)(1) of the Act, 21  U.S.C. section 360bbb-3(b)(1), unless the authorization is  terminated or revoked. Performed at [facility], [place] [address] [place], [place] [address]   Culture, blood (routine x 2) Call MD if unable to obtain prior to antibiotics being given     Status: None   Collection Time: 08/03/19  4:20 AM   Specimen: BLOOD  Result Value Ref Range Status   Specimen Description BLOOD RIGHT WRIST  Final   Special Requests   Final    BOTTLES DRAWN AEROBIC AND ANAEROBIC Blood Culture results may not be optimal due to an inadequate volume of blood received in culture bottles   Culture   Final    NO GROWTH 5  DAYS Performed at [facility], [place] [address] [place], [place] [address]    Report Status 08/08/2019 FINAL  Final  Culture, blood (Routine X 2) w Reflex to ID Panel     Status: None   Collection Time: 08/03/19 10:43 PM   Specimen: BLOOD RIGHT ARM  Result Value Ref Range Status   Specimen Description BLOOD RIGHT ARM  Final   Special Requests   Final    BOTTLES DRAWN AEROBIC AND ANAEROBIC Blood Culture adequate volume   Culture   Final    NO GROWTH 5 DAYS Performed at [facility], [place] [address], [place], [place] [address]    Report Status 08/08/2019 FINAL  Final     Labs: BNP (last 3 results) Recent Labs    08/02/19 1623 08/04/19 0639 08/05/19 0359  BNP 608.2* 1,116.7* 1,245.8*   Basic Metabolic Panel: Recent Labs  Lab 08/04/19 0342 08/04/19 0342 08/05/19 0359 08/06/19 0313 08/07/19 0333 08/08/19 0402 08/09/19 0338  NA 140   < > 140 135 137 140 138  K 4.3   < > 4.6 4.1 3.6 3.9 3.0*  CL 108   < > 107 109 104 103 102  CO2 24   < > 21* 19* 22 23 22   GLUCOSE 122*   < > 117* 123* 129* 129* 113*  BUN 33*   < > 29* 34* 43* 43* 44*  CREATININE 2.59*   < > 2.46* 2.44* 2.52* 2.67* 2.49*  CALCIUM 8.3*   < > 8.6* 8.1* 8.5* 8.9 8.5*  MG 2.0  --  2.0  --   --   --   --    < > = values in this interval not displayed.   Liver Function Tests: Recent Labs  Lab 08/02/19 1623 08/03/19 0326 08/07/19 0333  AST 14* 20 21  ALT 12 10 21   ALKPHOS 57 50 72  BILITOT 0.7 1.5* 0.5  PROT 6.4* 5.8* 5.9*  ALBUMIN 3.8 3.4* 2.8*   Recent Labs  Lab 08/02/19 1623  LIPASE 36   Recent Labs  Lab 08/05/19 0359  AMMONIA 17   CBC: Recent Labs  Lab 08/03/19 0448 08/06/19 0954 08/07/19 0333 08/08/19 0402 08/09/19 0338  WBC 10.2 10.0 8.9 10.6* 9.3  NEUTROABS 6.5  --   --   --   --   HGB 10.1* 10.0* 10.4* 10.4* 10.2*  HCT 32.5* 31.3* 31.8* 31.9* 30.7*  MCV 96.4 94.3 93.0 92.5 91.1  PLT 172 205 217 231 252   Cardiac Enzymes: No results for input(s): CKTOTAL, CKMB,  CKMBINDEX, TROPONINI in the last 168 hours. BNP: Invalid input(s): POCBNP CBG: Recent Labs  Lab 08/08/19 1140 08/08/19 1643 08/08/19 2132 08/09/19 0712 08/09/19 1147  GLUCAP 130* 121* 129* 106* 219*   D-Dimer No results for input(s): DDIMER in the last 72 hours. Hgb A1c No results for input(s): HGBA1C in the last 72 hours. Lipid Profile No results for input(s): CHOL, HDL, LDLCALC, TRIG, CHOLHDL, LDLDIRECT in the last 72 hours. Thyroid function studies No results for input(s): TSH, T4TOTAL, T3FREE, THYROIDAB in the last 72 hours.  Invalid input(s): FREET3 Anemia work up No results for input(s): VITAMINB12, FOLATE, FERRITIN, TIBC, IRON, RETICCTPCT in the last 72 hours. Urinalysis    Component Value Date/Time   COLORURINE YELLOW 08/04/2019 2030   APPEARANCEUR CLEAR 08/04/2019 2030   [place] 1.014 08/04/2019 2030   [place] 6.0 08/04/2019 2030   GLUCOSEU NEGATIVE 08/04/2019 2030   [place] NEGATIVE 08/04/2019 2030   [place] NEGATIVE 08/04/2019 2030   [place] 08/04/2019 2030   PROTEINUR 100 (A) 08/04/2019 2030   NITRITE NEGATIVE 08/04/2019 2030   LEUKOCYTESUR NEGATIVE 08/04/2019 2030   Sepsis Labs Invalid input(s): PROCALCITONIN,  WBC,  LACTICIDVEN Microbiology Recent Results (from the past 240 hour(s))  Respiratory Panel by RT PCR (Flu A&B, Covid) - Nasopharyngeal Swab     Status: None   Collection Time: 08/02/19  6:18 PM   Specimen: Nasopharyngeal Swab  Result Value Ref Range Status   SARS Coronavirus 2 by RT PCR NEGATIVE NEGATIVE Final    Comment: (NOTE) SARS-CoV-2 target nucleic acids are NOT DETECTED. The SARS-CoV-2 RNA is generally detectable in upper respiratoy specimens during the acute phase of infection. The lowest concentration of SARS-CoV-2 viral copies this assay can detect is 131 copies/mL. A negative result does not preclude SARS-Cov-2 infection and should not be used as the sole basis for treatment or other patient management decisions. A  negative result may occur with  improper specimen collection/handling, submission of specimen other than nasopharyngeal swab, presence of viral mutation(s) within the areas targeted by this assay, and inadequate number of viral copies (<131 copies/mL). A negative result must be combined with clinical observations, patient history, and epidemiological information. The expected result is Negative. Fact Sheet for Patients:  PinkCheek.be Fact Sheet for Healthcare Providers:  GravelBags.it This test is not yet ap proved or cleared by the [place] FDA and  has been authorized for detection and/or diagnosis of SARS-CoV-2 by FDA under an Emergency Use Authorization (EUA). This EUA will remain  in effect (meaning this test can be used) for the duration of the COVID-19 declaration under Section 564(b)(1) of the Act, 21 U.S.C. section 360bbb-3(b)(1), unless the authorization is terminated or revoked sooner.    Influenza A by PCR NEGATIVE NEGATIVE Final   Influenza B by PCR NEGATIVE NEGATIVE Final    Comment: (NOTE) The Xpert Xpress SARS-CoV-2/FLU/RSV assay is intended as an aid in  the diagnosis of influenza from Nasopharyngeal swab specimens and  should not be used as a sole basis for treatment. Nasal washings and  aspirates are unacceptable for Xpert Xpress SARS-CoV-2/FLU/RSV  testing. Fact Sheet for Patients: PinkCheek.be Fact Sheet for Healthcare Providers: GravelBags.it This test is not yet approved or cleared by the [place] FDA and  has been authorized for detection and/or diagnosis of SARS-CoV-2 by  FDA under an Emergency Use Authorization (EUA). This EUA will remain  in effect (meaning this test can be used) for the duration of the  Covid-19 declaration under Section 564(b)(1) of the Act, 21  U.S.C. section 360bbb-3(b)(1), unless the authorization is   terminated or revoked. Performed at [facility], [place] [address] [place], [place] [address]   Culture, blood (routine x 2) Call MD if unable to obtain prior to antibiotics being given     Status: None   Collection Time: 08/03/19  4:20 AM   Specimen: BLOOD  Result Value Ref Range Status   Specimen Description BLOOD RIGHT WRIST  Final   Special Requests   Final    BOTTLES DRAWN AEROBIC AND ANAEROBIC Blood Culture results may not be optimal due to an inadequate volume of blood received in culture bottles   Culture   Final    NO GROWTH 5 DAYS Performed at [facility], [place] [address], [place], [place] [address]    Report Status 08/08/2019 FINAL  Final  Culture, blood (Routine X 2) w Reflex to ID Panel     Status: None   Collection Time: 08/03/19 10:43 PM   Specimen: BLOOD RIGHT ARM  Result Value Ref Range Status   Specimen Description BLOOD RIGHT ARM  Final   Special Requests   Final    BOTTLES DRAWN AEROBIC AND ANAEROBIC Blood Culture adequate volume   Culture   Final    NO GROWTH 5 DAYS Performed at [facility], [place] [address], [place],  [address]    Report Status 08/08/2019 FINAL  Final     Patient was seen and examined on the day of discharge and was found to be in stable condition. Time coordinating discharge: 35 minutes including assessment and coordination of care, as well as examination of the patient.   SIGNED:  Dessa Phi, DO [facility] 08/09/2019, 2:29 PM

## 2019-08-09 NOTE — Progress Notes (Signed)
PROGRESS NOTE    Adam [name]  SNK:[id number] DOB: June 06, 1930 DOA: 08/02/2019 PCP: Javier Glazier, MD     Brief Narrative:  Adam [name] is an 86 year old male with past medical history significant for hyperlipidemia, hypertension who presented to the hospital on 5/2 due to back pain, abdominal pain and shortness of breath.  Work-up in the emergency department revealed multifocal pneumonia and patient was admitted and started on IV antibiotics.  On 5/3, patient went into A. fib RVR and was started on IV Cardizem, IV heparin.  Cardiology was consulted at that time.  Also had postconversion pauses seen on telemetry.  Nephrology also following for acute kidney injury. Due to continued A fib RVR, he was switched to IV amiodarone.   New events last 24 hours / Subjective: Patient is sitting in chair, no complaints this morning.  Has some left ankle pain but able to bear weight without pain.  Denies any shortness of breath.  Assessment & Plan:   Principal Problem:   CAP (community acquired pneumonia) Active Problems:   HTN (hypertension)   HLD (hyperlipidemia)   Type 2 diabetes mellitus (HCC)   BPH (benign prostatic hyperplasia)   Leucocytosis   ARF (acute renal failure) (HCC)   Hypoxia   Atrial fibrillation, new onset (HCC)   Atrial fibrillation with RVR (HCC)   Multifocal pneumonia -Chest x-ray showed mild hazy infrahilar opacification right worse than left -CT chest revealed small bilateral pleural effusion, concern for multifocal pneumonia -Blood cultures negative to date -SARS-CoV-2, influenza negative -Doxycycline, Rocephin x 7 days (last day 5/9)  -Repeat CXR in AM, pending official radiology read, it was independently reviewed by myself 5/9 with improvement in consolidation  A. fib RVR -With postconversion pauses and bradycardia -Eliquis -Continue metoprolol.  IV amiodarone --> p.o. amiodarone -Appreciate cardiology, EP   Acute hypoxemic respiratory failure secondary to  acute on chronic diastolic heart failure -Chest x-ray obtained 5/6, independently reviewed, increased pulmonary congestion -PO Lasix -On room air this morning, would like patient to ambulate off of oxygen  Acute kidney injury on CKD stage IIIb -Baseline creatinine 1.7 -Renal ultrasound without hydronephrosis, increased renal parenchymal echogenicity and parenchymal atrophy consistent with chronic kidney disease -Appreciate nephrology  Elevated troponin -Flat trend, 20 --> 18 -Likely demand ischemia secondary to above  Type 2 diabetes, well controlled -Hemoglobin A1c 5.7 -Sliding scale insulin  Hypertension -Continue hydralazine, metoprolol, Norvasc  Hyperlipidemia -Continue Lipitor  BPH -Continue Proscar  Hypokalemia -Replace, trend   DVT prophylaxis: Eliquis Code Status: DNR Family Communication: Daughters at bedside Disposition Plan:   Status is: Inpatient  Remains inpatient appropriate because:Inpatient level of care appropriate due to severity of illness   Dispo: The patient is from: Independent living facility              Anticipated d/c is to: Independent living facility with home health              Anticipated d/c date is: 1 day              Patient currently is not medically stable to d/c.  He will be switched to p.o. amiodarone.  Daughter is requesting another PT evaluation for home desat oxygen screening.  Last day of antibiotic today.  Hopeful discharge back to independent living facility 5/10.  Consultants:   Cardiology  EP  Nephrology   Antimicrobials:  Anti-infectives (From admission, onward)   Start     Dose/Rate Route Frequency Ordered Stop   08/05/19 1200  doxycycline (VIBRA-TABS)  tablet 100 mg     100 mg Oral Every 12 hours 08/05/19 1121 08/09/19 2359   08/04/19 0000  azithromycin (ZITHROMAX) 500 mg in sodium chloride 0.9 % 250 mL IVPB  Status:  Discontinued     500 mg 250 mL/hr over 60 Minutes Intravenous Every 24 hours 08/03/19 0325  08/05/19 1121   08/03/19 2200  cefTRIAXone (ROCEPHIN) 1 g in sodium chloride 0.9 % 100 mL IVPB  Status:  Discontinued     1 g 200 mL/hr over 30 Minutes Intravenous Every 24 hours 08/03/19 0325 08/03/19 2118   08/03/19 2200  cefTRIAXone (ROCEPHIN) 2 g in sodium chloride 0.9 % 100 mL IVPB     2 g 200 mL/hr over 30 Minutes Intravenous Every 24 hours 08/03/19 2118 08/08/19 2359   08/02/19 2030  cefTRIAXone (ROCEPHIN) 2 g in sodium chloride 0.9 % 100 mL IVPB  Status:  Discontinued     2 g 200 mL/hr over 30 Minutes Intravenous Every 24 hours 08/02/19 2019 08/03/19 0349   08/02/19 2030  azithromycin (ZITHROMAX) 500 mg in sodium chloride 0.9 % 250 mL IVPB  Status:  Discontinued     500 mg 250 mL/hr over 60 Minutes Intravenous Every 24 hours 08/02/19 2019 08/03/19 0350       Objective: Vitals:   08/09/19 0422 08/09/19 0626 08/09/19 0716 08/09/19 0813  BP: (!) 163/91 (!) 153/97 (!) 175/67   Pulse: (!) 102  (!) 119   Resp:   19   Temp:   98.4 F (36.9 C)   TempSrc:      SpO2:   97% 97%  Weight:      Height:        Intake/Output Summary (Last 24 hours) at 08/09/2019 1033 Last data filed at 08/09/2019 0300 Gross per 24 hour  Intake 1449.56 ml  Output --  Net 1449.56 ml   Filed Weights   08/03/19 0315 08/03/19 1048  Weight: 72.6 kg 72.8 kg    Examination: General exam: Appears calm and comfortable  Respiratory system: Clear to auscultation. Respiratory effort normal. Cardiovascular system: S1 & S2 heard, irregular rhythm, rate 110.  Trace pedal edema. Gastrointestinal system: Abdomen is nondistended, soft and nontender. Normal bowel sounds heard. Central nervous system: Alert and oriented. Non focal exam. Speech clear  Extremities: Symmetric in appearance bilaterally  Skin: No rashes, lesions or ulcers on exposed skin  Psychiatry: Judgement and insight appear stable. Mood & affect appropriate.    Data Reviewed: I have personally reviewed following labs and imaging  studies  CBC: Recent Labs  Lab 08/03/19 0448 08/06/19 0954 08/07/19 0333 08/08/19 0402 08/09/19 0338  WBC 10.2 10.0 8.9 10.6* 9.3  NEUTROABS 6.5  --   --   --   --   HGB 10.1* 10.0* 10.4* 10.4* 10.2*  HCT 32.5* 31.3* 31.8* 31.9* 30.7*  MCV 96.4 94.3 93.0 92.5 91.1  PLT 172 205 217 231 782   Basic Metabolic Panel: Recent Labs  Lab 08/04/19 0342 08/04/19 0342 08/05/19 0359 08/06/19 0313 08/07/19 0333 08/08/19 0402 08/09/19 0338  NA 140   < > 140 135 137 140 138  K 4.3   < > 4.6 4.1 3.6 3.9 3.0*  CL 108   < > 107 109 104 103 102  CO2 24   < > 21* 19* 22 23 22   GLUCOSE 122*   < > 117* 123* 129* 129* 113*  BUN 33*   < > 29* 34* 43* 43* 44*  CREATININE 2.59*   < >  2.46* 2.44* 2.52* 2.67* 2.49*  CALCIUM 8.3*   < > 8.6* 8.1* 8.5* 8.9 8.5*  MG 2.0  --  2.0  --   --   --   --    < > = values in this interval not displayed.   GFR: Estimated Creatinine Clearance: 19.1 mL/min (A) (by C-G formula based on SCr of 2.49 mg/dL (H)). Liver Function Tests: Recent Labs  Lab 08/02/19 1623 08/03/19 0326 08/07/19 0333  AST 14* 20 21  ALT 12 10 21   ALKPHOS 57 50 72  BILITOT 0.7 1.5* 0.5  PROT 6.4* 5.8* 5.9*  ALBUMIN 3.8 3.4* 2.8*   Recent Labs  Lab 08/02/19 1623  LIPASE 36   Recent Labs  Lab 08/05/19 0359  AMMONIA 17   Coagulation Profile: No results for input(s): INR, PROTIME in the last 168 hours. Cardiac Enzymes: No results for input(s): CKTOTAL, CKMB, CKMBINDEX, TROPONINI in the last 168 hours. BNP (last 3 results) No results for input(s): PROBNP in the last 8760 hours. HbA1C: No results for input(s): HGBA1C in the last 72 hours. CBG: Recent Labs  Lab 08/07/19 2144 08/08/19 1140 08/08/19 1643 08/08/19 2132 08/09/19 0712  GLUCAP 128* 130* 121* 129* 106*   Lipid Profile: No results for input(s): CHOL, HDL, LDLCALC, TRIG, CHOLHDL, LDLDIRECT in the last 72 hours. Thyroid Function Tests: No results for input(s): TSH, T4TOTAL, FREET4, T3FREE, THYROIDAB in the  last 72 hours. Anemia Panel: No results for input(s): VITAMINB12, FOLATE, FERRITIN, TIBC, IRON, RETICCTPCT in the last 72 hours. Sepsis Labs: Recent Labs  Lab 08/04/19 0639 08/04/19 0933 08/06/19 0313 08/07/19 0333  PROCALCITON 0.94  --  0.65 0.47  LATICACIDVEN 1.2 1.4  --   --     Recent Results (from the past 240 hour(s))  Respiratory Panel by RT PCR (Flu A&B, Covid) - Nasopharyngeal Swab     Status: None   Collection Time: 08/02/19  6:18 PM   Specimen: Nasopharyngeal Swab  Result Value Ref Range Status   SARS Coronavirus 2 by RT PCR NEGATIVE NEGATIVE Final    Comment: (NOTE) SARS-CoV-2 target nucleic acids are NOT DETECTED. The SARS-CoV-2 RNA is generally detectable in upper respiratoy specimens during the acute phase of infection. The lowest concentration of SARS-CoV-2 viral copies this assay can detect is 131 copies/mL. A negative result does not preclude SARS-Cov-2 infection and should not be used as the sole basis for treatment or other patient management decisions. A negative result may occur with  improper specimen collection/handling, submission of specimen other than nasopharyngeal swab, presence of viral mutation(s) within the areas targeted by this assay, and inadequate number of viral copies (<131 copies/mL). A negative result must be combined with clinical observations, patient history, and epidemiological information. The expected result is Negative. Fact Sheet for Patients:  PinkCheek.be Fact Sheet for Healthcare Providers:  GravelBags.it This test is not yet ap proved or cleared by the [place] FDA and  has been authorized for detection and/or diagnosis of SARS-CoV-2 by FDA under an Emergency Use Authorization (EUA). This EUA will remain  in effect (meaning this test can be used) for the duration of the COVID-19 declaration under Section 564(b)(1) of the Act, 21 U.S.C. section 360bbb-3(b)(1),  unless the authorization is terminated or revoked sooner.    Influenza A by PCR NEGATIVE NEGATIVE Final   Influenza B by PCR NEGATIVE NEGATIVE Final    Comment: (NOTE) The Xpert Xpress SARS-CoV-2/FLU/RSV assay is intended as an aid in  the diagnosis of influenza from Nasopharyngeal  swab specimens and  should not be used as a sole basis for treatment. Nasal washings and  aspirates are unacceptable for Xpert Xpress SARS-CoV-2/FLU/RSV  testing. Fact Sheet for Patients: PinkCheek.be Fact Sheet for Healthcare Providers: GravelBags.it This test is not yet approved or cleared by the [place] FDA and  has been authorized for detection and/or diagnosis of SARS-CoV-2 by  FDA under an Emergency Use Authorization (EUA). This EUA will remain  in effect (meaning this test can be used) for the duration of the  Covid-19 declaration under Section 564(b)(1) of the Act, 21  U.S.C. section 360bbb-3(b)(1), unless the authorization is  terminated or revoked. Performed at [facility], [place] [address] [place], [place] [address]   Culture, blood (routine x 2) Call MD if unable to obtain prior to antibiotics being given     Status: None   Collection Time: 08/03/19  4:20 AM   Specimen: BLOOD  Result Value Ref Range Status   Specimen Description BLOOD RIGHT WRIST  Final   Special Requests   Final    BOTTLES DRAWN AEROBIC AND ANAEROBIC Blood Culture results may not be optimal due to an inadequate volume of blood received in culture bottles   Culture   Final    NO GROWTH 5 DAYS Performed at [facility], [place] [address] [place], [place] [address]    Report Status 08/08/2019 FINAL  Final  Culture, blood (Routine X 2) w Reflex to ID Panel     Status: None   Collection Time: 08/03/19 10:43 PM   Specimen: BLOOD RIGHT ARM  Result Value Ref Range Status   Specimen Description BLOOD RIGHT ARM  Final   Special Requests   Final    BOTTLES  DRAWN AEROBIC AND ANAEROBIC Blood Culture adequate volume   Culture   Final    NO GROWTH 5 DAYS Performed at [facility], [place] [address], [place], [place] [address]    Report Status 08/08/2019 FINAL  Final      Radiology Studies: No results found.    Scheduled Meds: . amLODipine  5 mg Oral Daily  . apixaban  2.5 mg Oral BID  . vitamin C  500 mg Oral Daily  . aspirin  81 mg Oral Daily  . atorvastatin  20 mg Oral Daily  . timolol  1 drop Left Eye BID   And  . brimonidine  1 drop Left Eye BID  . carvedilol  6.25 mg Oral BID WC  . cholecalciferol  2,000 Units Oral Daily  . dorzolamide-timolol  1 drop Left Eye BID  . doxycycline  100 mg Oral Q12H  . febuxostat  40 mg Oral Once per day on Sun Tue Fri  . finasteride  5 mg Oral Daily  . furosemide  40 mg Oral BID  . hydrALAZINE  100 mg Oral Q8H  . insulin aspart  0-5 Units Subcutaneous QHS  . insulin aspart  0-9 Units Subcutaneous TID WC  . ipratropium  0.5 mg Nebulization BID  . latanoprost  1 drop Both Eyes QHS  . multivitamin  1 tablet Oral BID  . pantoprazole  40 mg Oral Daily  . sodium bicarbonate  1,300 mg Oral BID  . triamcinolone cream   Topical BID   Continuous Infusions: . amiodarone 30 mg/hr (08/09/19 0427)     LOS: 7 days      Time spent: 30 minutes   Dessa Phi, DO [facility] 08/09/2019, 10:33 AM   Available via  Epic secure chat 7am-7pm After these hours, please refer to coverage provider listed on [email or web address]

## 2019-08-09 NOTE — Progress Notes (Signed)
Patient ID: Adam [name], male   DOB: 01-Jul-1930, 86 y.o.   MRN: [id number] S: Feels well, no complaints O:BP (!) 175/67   Pulse (!) 119   Temp 98.4 F (36.9 C)   Resp 19   Ht 5\' 4"  (1.626 m)   Wt 72.8 kg   SpO2 97%   BMI 27.55 kg/m   Intake/Output Summary (Last 24 hours) at 08/09/2019 1053 Last data filed at 08/09/2019 0300 Gross per 24 hour  Intake 1449.56 ml  Output -  Net 1449.56 ml   Intake/Output: I/O last 3 completed shifts: In: 1789.6 [P.O.:600; I.V.:789.6; IV Piggyback:400] Out: 476 [Urine:475; Stool:1]  Intake/Output this shift:  No intake/output data recorded. Weight change:  Gen: NAD CVS: IRR IRR, tachy at 119 Resp: cta without wheezes this morning Abd: +BS, soft, NT/ND Ext: 1+ pedal/ankle edema  Recent Labs  Lab 08/02/19 1623 08/02/19 1623 08/03/19 0326 08/04/19 0342 08/05/19 0359 08/06/19 0313 08/07/19 0333 08/08/19 0402 08/09/19 0338  NA 138   < > 137 140 140 135 137 140 138  K 4.5   < > 5.5* 4.3 4.6 4.1 3.6 3.9 3.0*  CL 106   < > 105 108 107 109 104 103 102  CO2 21*   < > 21* 24 21* 19* 22 23 22   GLUCOSE 121*   < > 112* 122* 117* 123* 129* 129* 113*  BUN 32*   < > 33* 33* 29* 34* 43* 43* 44*  CREATININE 2.45*   < > 2.60* 2.59* 2.46* 2.44* 2.52* 2.67* 2.49*  ALBUMIN 3.8  --  3.4*  --   --   --  2.8*  --   --   CALCIUM 8.9   < > 8.6* 8.3* 8.6* 8.1* 8.5* 8.9 8.5*  AST 14*  --  20  --   --   --  21  --   --   ALT 12  --  10  --   --   --  21  --   --    < > = values in this interval not displayed.   Liver Function Tests: Recent Labs  Lab 08/02/19 1623 08/03/19 0326 08/07/19 0333  AST 14* 20 21  ALT 12 10 21   ALKPHOS 57 50 72  BILITOT 0.7 1.5* 0.5  PROT 6.4* 5.8* 5.9*  ALBUMIN 3.8 3.4* 2.8*   Recent Labs  Lab 08/02/19 1623  LIPASE 36   Recent Labs  Lab 08/05/19 0359  AMMONIA 17   CBC: Recent Labs  Lab 08/03/19 0448 08/03/19 0448 08/06/19 0954 08/06/19 0954 08/07/19 0333 08/08/19 0402 08/09/19 0338  WBC 10.2   < > 10.0   < >  8.9 10.6* 9.3  NEUTROABS 6.5  --   --   --   --   --   --   HGB 10.1*   < > 10.0*   < > 10.4* 10.4* 10.2*  HCT 32.5*   < > 31.3*   < > 31.8* 31.9* 30.7*  MCV 96.4  --  94.3  --  93.0 92.5 91.1  PLT 172   < > 205   < > 217 231 252   < > = values in this interval not displayed.   Cardiac Enzymes: No results for input(s): CKTOTAL, CKMB, CKMBINDEX, TROPONINI in the last 168 hours. CBG: Recent Labs  Lab 08/07/19 2144 08/08/19 1140 08/08/19 1643 08/08/19 2132 08/09/19 0712  GLUCAP 128* 130* 121* 129* 106*    Iron Studies: No results for input(s):  IRON, TIBC, TRANSFERRIN, FERRITIN in the last 72 hours. Studies/Results: No results found. Marland Kitchen amLODipine  5 mg Oral Daily  . apixaban  2.5 mg Oral BID  . vitamin C  500 mg Oral Daily  . aspirin  81 mg Oral Daily  . atorvastatin  20 mg Oral Daily  . timolol  1 drop Left Eye BID   And  . brimonidine  1 drop Left Eye BID  . carvedilol  6.25 mg Oral BID WC  . cholecalciferol  2,000 Units Oral Daily  . dorzolamide-timolol  1 drop Left Eye BID  . doxycycline  100 mg Oral Q12H  . febuxostat  40 mg Oral Once per day on Sun Tue Fri  . finasteride  5 mg Oral Daily  . furosemide  40 mg Oral BID  . hydrALAZINE  100 mg Oral Q8H  . insulin aspart  0-5 Units Subcutaneous QHS  . insulin aspart  0-9 Units Subcutaneous TID WC  . ipratropium  0.5 mg Nebulization BID  . latanoprost  1 drop Both Eyes QHS  . multivitamin  1 tablet Oral BID  . pantoprazole  40 mg Oral Daily  . sodium bicarbonate  1,300 mg Oral BID  . triamcinolone cream   Topical BID    BMET    Component Value Date/Time   NA 138 08/09/2019 0338   NA 138 02/01/2012 0920   K 3.0 (L) 08/09/2019 0338   K 4.6 02/01/2012 0920   CL 102 08/09/2019 0338   CL 113 (H) 02/01/2012 0920   CO2 22 08/09/2019 0338   CO2 21 (L) 02/01/2012 0920   GLUCOSE 113 (H) 08/09/2019 0338   GLUCOSE 130 (H) 02/01/2012 0920   BUN 44 (H) 08/09/2019 0338   BUN 28.0 (H) 02/01/2012 0920   CREATININE 2.49 (H)  08/09/2019 0338   CREATININE 1.64 (H) 12/15/2015 1025   CREATININE 1.4 (H) 02/01/2012 0920   CALCIUM 8.5 (L) 08/09/2019 0338   CALCIUM 9.5 02/01/2012 0920   GFRNONAA 22 (L) 08/09/2019 0338   GFRAA 26 (L) 08/09/2019 0338   CBC    Component Value Date/Time   WBC 9.3 08/09/2019 0338   RBC 3.37 (L) 08/09/2019 0338   HGB 10.2 (L) 08/09/2019 0338   HGB 9.8 (L) 02/01/2012 0920   HCT 30.7 (L) 08/09/2019 0338   HCT 28.0 (L) 02/01/2012 0920   PLT 252 08/09/2019 0338   PLT 160 02/01/2012 0920   MCV 91.1 08/09/2019 0338   MCV 102.0 (H) 02/01/2012 0920   MCH 30.3 08/09/2019 0338   MCHC 33.2 08/09/2019 0338   RDW 13.7 08/09/2019 0338   RDW 14.4 02/01/2012 0920   LYMPHSABS 1.7 08/03/2019 0448   LYMPHSABS 0.9 02/01/2012 0920   MONOABS 1.7 (H) 08/03/2019 0448   MONOABS 0.4 02/01/2012 0920   EOSABS 0.1 08/03/2019 0448   EOSABS 0.2 02/01/2012 0920   BASOSABS 0.1 08/03/2019 0448   BASOSABS 0.1 02/01/2012 0920    Assessment/Plan:  1. AKI/CKD stage IIIb-IV (baseline Scr 1.7-2)- in setting of CAP, a fib with RVR and some volume overload and IV diuresis.  IV lasix stopped 08/07/19 and started on po lasix 40 mg 08/08/19 due to rising BUN/Cr.   Cr improved slightly to 2.49. 2. Multifocal pneumonia- per primary svc.  WBC climbing.  Completed 7 days of rocephin, now on doxycycline.   3. Paroxysmal atrial fibrillation with RVR- had postconversion pauses with bradycardia.  Given IV amiodarone and now on po amio.  No need for pacemaker.  Cardiology has  signed off. 4. Sick Sinus syndrome- per EP.  5. Aortic stenosis- moderate on ECHO 6. HTN- BP elevated and amlodipine 5 mg added, hydralazine 100mg  tid and metoprolol switched to carvedilol at 6.25 mg bid.  Increase as needed. 7. Acute hypoxic respiratory distress initially thought to be due to acute on chronic diastolic CHF (ECHO with EF 60-65% and grade II DD), and given multiple doses of IV lasix.  He has responded more to atrovent nebulizers.  Given  recurrence of wheezing, will order atrovent q6 and not prn as he is not able to ask for it given his underlying dementia. 8. Anemia of CKD- Hgb stable 9. Dementia- stable  Donetta Potts, MD [employer] [phone]

## 2019-08-09 NOTE — Progress Notes (Signed)
Progress Note  Patient Name: Adam [name] Date of Encounter: 08/09/2019  Primary Cardiologist: Dorris Carnes, MD   Subjective   Dyspnea improved. Denies palpitations.  Inpatient Medications    Scheduled Meds: . amLODipine  5 mg Oral Daily  . apixaban  2.5 mg Oral BID  . vitamin C  500 mg Oral Daily  . aspirin  81 mg Oral Daily  . atorvastatin  20 mg Oral Daily  . timolol  1 drop Left Eye BID   And  . brimonidine  1 drop Left Eye BID  . carvedilol  6.25 mg Oral BID WC  . cholecalciferol  2,000 Units Oral Daily  . dorzolamide-timolol  1 drop Left Eye BID  . doxycycline  100 mg Oral Q12H  . febuxostat  40 mg Oral Once per day on Sun Tue Fri  . finasteride  5 mg Oral Daily  . furosemide  40 mg Oral BID  . hydrALAZINE  100 mg Oral Q8H  . insulin aspart  0-5 Units Subcutaneous QHS  . insulin aspart  0-9 Units Subcutaneous TID WC  . ipratropium  0.5 mg Nebulization BID  . latanoprost  1 drop Both Eyes QHS  . multivitamin  1 tablet Oral BID  . pantoprazole  40 mg Oral Daily  . sodium bicarbonate  1,300 mg Oral BID  . triamcinolone cream   Topical BID   Continuous Infusions: . amiodarone 30 mg/hr (08/09/19 0427)   PRN Meds: acetaminophen, hydrALAZINE, levalbuterol, metoprolol tartrate, ondansetron (ZOFRAN) IV   Vital Signs    Vitals:   08/09/19 0422 08/09/19 0626 08/09/19 0716 08/09/19 0813  BP: (!) 163/91 (!) 153/97 (!) 175/67   Pulse: (!) 102  (!) 119   Resp:   19   Temp:   98.4 F (36.9 C)   TempSrc:      SpO2:   97% 97%  Weight:      Height:        Intake/Output Summary (Last 24 hours) at 08/09/2019 1024 Last data filed at 08/09/2019 0300 Gross per 24 hour  Intake 1449.56 ml  Output --  Net 1449.56 ml   Filed Weights   08/03/19 0315 08/03/19 1048  Weight: 72.6 kg 72.8 kg    Telemetry    Atrial fib with a RVR/CVR - Personally Reviewed  ECG    none - Personally Reviewed  Physical Exam   GEN: No acute distress.   Neck: No JVD Cardiac: IRIRR, no  murmurs, rubs, or gallops.  Respiratory: Clear to auscultation bilaterally. GI: Soft, nontender, non-distended  MS: No edema; No deformity. Neuro:  Nonfocal  Psych: Normal affect   Labs    Chemistry Recent Labs  Lab 08/02/19 1623 08/02/19 1623 08/03/19 0326 08/04/19 0342 08/07/19 0333 08/08/19 0402 08/09/19 0338  NA 138   < > 137   < > 137 140 138  K 4.5   < > 5.5*   < > 3.6 3.9 3.0*  CL 106   < > 105   < > 104 103 102  CO2 21*   < > 21*   < > 22 23 22   GLUCOSE 121*   < > 112*   < > 129* 129* 113*  BUN 32*   < > 33*   < > 43* 43* 44*  CREATININE 2.45*   < > 2.60*   < > 2.52* 2.67* 2.49*  CALCIUM 8.9   < > 8.6*   < > 8.5* 8.9 8.5*  PROT 6.4*  --  5.8*  --  5.9*  --   --   ALBUMIN 3.8  --  3.4*  --  2.8*  --   --   AST 14*  --  20  --  21  --   --   ALT 12  --  10  --  21  --   --   ALKPHOS 57  --  50  --  72  --   --   BILITOT 0.7  --  1.5*  --  0.5  --   --   GFRNONAA 23*   < > 21*   < > 22* 21* 22*  GFRAA 26*   < > 25*   < > 26* 24* 26*  ANIONGAP 11   < > 11   < > 11 14 14    < > = values in this interval not displayed.     Hematology Recent Labs  Lab 08/07/19 0333 08/08/19 0402 08/09/19 0338  WBC 8.9 10.6* 9.3  RBC 3.42* 3.45* 3.37*  HGB 10.4* 10.4* 10.2*  HCT 31.8* 31.9* 30.7*  MCV 93.0 92.5 91.1  MCH 30.4 30.1 30.3  MCHC 32.7 32.6 33.2  RDW 13.8 13.9 13.7  PLT 217 231 252    Cardiac EnzymesNo results for input(s): TROPONINI in the last 168 hours. No results for input(s): TROPIPOC in the last 168 hours.   BNP Recent Labs  Lab 08/02/19 1623 08/04/19 0639 08/05/19 0359  BNP 608.2* 1,116.7* 1,476.0*     DDimer  Recent Labs  Lab 08/02/19 1841  DDIMER 0.68*     Radiology    No results found.  Cardiac Studies   none  Patient Profile     86 y.o. male admitted with pneumonia, found to have atrial fib and initially long post conversion pauses  Assessment & Plan    1. Atrial fib- he is in atrial fib this morning but his rates are not too  fast and he is asymptomatic. We will switch him from IV to oral amiodarone 200 mg twice daily.  2. Coags - he will be discharged home on eliquis 2.5 bid 3. Acute on chronic renal insufficiency - his creatinine is stable.  4. Acute on chronic diastolic heart failure - he appears euvolemic. He will go home on lasix 40 mg bid. 5. AS - he will need a repeat 2D echo in 6 months. 6. Sinus node dysfunction - he has had a marked improvement in his post conversion pauses.  7. Disp. -he can be discharged home on meds as above. I would like him to wear a 14 day zio patch. I will have him see Dr. [name] back in 3-4 weeks. We will defer whether or not he needs to go to rehab to PT team. [facility] will sign off.   Medication Recommendations:  See above Other recommendations (labs, testing, etc):  Bmp in 3-5 days Follow up as an outpatient:  See above  For questions or updates, please contact [place] Please consult [email or web address] for contact info under Cardiology/STEMI.      Signed, [name], MD  08/09/2019, 10:24 AM  Patient ID: Adam [name], male   DOB: 02/07/1931, 86 y.o.   MRN: [id number]

## 2019-08-09 NOTE — Progress Notes (Signed)
Physical Therapy Treatment Patient Details Name: Adam [name] MRN: [id number] DOB: 1930-06-30 Today's Date: 08/09/2019    History of Present Illness 86 y.o. male with a hx of hypertension, diet-controlled diabetes mellitus, GERD, BPH, chronic kidney disease IV, and gait imbalance admitted to ED on 5/2 from [place] with multifocal PNA, and developed afib with RVR. Cardiology following.    PT Comments    Pt making good progress with mobility. Feel pt can return to his independent living facility with some initial supervision of daughter and HHPT and [place]. Pt doesn't require supplemental O2 with SpO2 staying >96%. Pt and daughters in agreement.    Follow Up Recommendations  Home health PT;Supervision - Intermittent     Equipment Recommendations  None recommended by PT    Recommendations for Other Services       Precautions / Restrictions Precautions Precautions: Fall Restrictions Weight Bearing Restrictions: No    Mobility  Bed Mobility               General bed mobility comments: Pt up in chair  Transfers Overall transfer level: Modified independent Equipment used: 4-wheeled walker;None Transfers: Sit to/from Stand Sit to Stand: Modified independent (Device/Increase time)         General transfer comment: Performed x 5 for strengthening  Ambulation/Gait Ambulation/Gait assistance: Supervision;Min guard Gait Distance (Feet): 250 Feet Assistive device: 4-wheeled walker;None Gait Pattern/deviations: Step-through pattern;Decreased stride length;Trunk flexed Gait velocity: decr Gait velocity interpretation: 1.31 - 2.62 ft/sec, indicative of limited community ambulator General Gait Details: supervision for lines when using rollator with steady gait. Without assistive device min guard and pt using wall rail at times   Stairs             Wheelchair Mobility    Modified Rankin (Stroke Patients Only)       Balance Overall balance assessment: Needs  assistance Sitting-balance support: No upper extremity supported;Feet supported Sitting balance-Leahy Scale: Normal     Standing balance support: No upper extremity supported Standing balance-Leahy Scale: Fair                              Cognition Arousal/Alertness: Awake/alert Behavior During Therapy: WFL for tasks assessed/performed Overall Cognitive Status: Within Functional Limits for tasks assessed                                        Exercises Other Exercises Other Exercises: repeated sit to stand x 4    General Comments General comments (skin integrity, edema, etc.): SpO2 97% at rest on RA and with amb      Pertinent Vitals/Pain Pain Assessment: Faces Faces Pain Scale: Hurts a little bit Pain Location: rt foot/ankle Pain Intervention(s): Monitored during session    Home Living                      Prior Function            PT Goals (current goals can now be found in the care plan section) Acute Rehab PT Goals Patient Stated Goal: go back to ILF, return to independence Progress towards PT goals: Progressing toward goals    Frequency    Min 3X/week      PT Plan Current plan remains appropriate    Co-evaluation  AM-PAC PT "6 Clicks" Mobility   Outcome Measure  Help needed turning from your back to your side while in a flat bed without using bedrails?: None Help needed moving from lying on your back to sitting on the side of a flat bed without using bedrails?: None Help needed moving to and from a bed to a chair (including a wheelchair)?: A Little Help needed standing up from a chair using your arms (e.g., wheelchair or bedside chair)?: None Help needed to walk in hospital room?: A Little Help needed climbing 3-5 steps with a railing? : A Little 6 Click Score: 21    End of Session Equipment Utilized During Treatment: Gait belt Activity Tolerance: Patient tolerated treatment well Patient  left: in chair;with call bell/phone within reach;with family/visitor present   PT Visit Diagnosis: Other abnormalities of gait and mobility (R26.89);Muscle weakness (generalized) (M62.81)     Time: 2956-2130 PT Time Calculation (min) (ACUTE ONLY): 27 min  Charges:  $Gait Training: 23-37 mins                     [place] Pager [phone] Office [place] 08/09/2019, 11:44 AM

## 2019-08-11 DIAGNOSIS — Q6101 Congenital single renal cyst: Secondary | ICD-10-CM | POA: Diagnosis not present

## 2019-08-11 DIAGNOSIS — N179 Acute kidney failure, unspecified: Secondary | ICD-10-CM | POA: Diagnosis not present

## 2019-08-11 DIAGNOSIS — D72829 Elevated white blood cell count, unspecified: Secondary | ICD-10-CM | POA: Diagnosis not present

## 2019-08-11 DIAGNOSIS — Z9181 History of falling: Secondary | ICD-10-CM | POA: Diagnosis not present

## 2019-08-11 DIAGNOSIS — K449 Diaphragmatic hernia without obstruction or gangrene: Secondary | ICD-10-CM | POA: Diagnosis not present

## 2019-08-11 DIAGNOSIS — E785 Hyperlipidemia, unspecified: Secondary | ICD-10-CM | POA: Diagnosis not present

## 2019-08-11 DIAGNOSIS — N400 Enlarged prostate without lower urinary tract symptoms: Secondary | ICD-10-CM | POA: Diagnosis not present

## 2019-08-11 DIAGNOSIS — I0981 Rheumatic heart failure: Secondary | ICD-10-CM | POA: Diagnosis not present

## 2019-08-11 DIAGNOSIS — M1909 Primary osteoarthritis, other specified site: Secondary | ICD-10-CM | POA: Diagnosis not present

## 2019-08-11 DIAGNOSIS — I130 Hypertensive heart and chronic kidney disease with heart failure and stage 1 through stage 4 chronic kidney disease, or unspecified chronic kidney disease: Secondary | ICD-10-CM | POA: Diagnosis not present

## 2019-08-11 DIAGNOSIS — I10 Essential (primary) hypertension: Secondary | ICD-10-CM | POA: Diagnosis not present

## 2019-08-11 DIAGNOSIS — E1122 Type 2 diabetes mellitus with diabetic chronic kidney disease: Secondary | ICD-10-CM | POA: Diagnosis not present

## 2019-08-11 DIAGNOSIS — I083 Combined rheumatic disorders of mitral, aortic and tricuspid valves: Secondary | ICD-10-CM | POA: Diagnosis not present

## 2019-08-11 DIAGNOSIS — I4891 Unspecified atrial fibrillation: Secondary | ICD-10-CM | POA: Diagnosis not present

## 2019-08-11 DIAGNOSIS — J9811 Atelectasis: Secondary | ICD-10-CM | POA: Diagnosis not present

## 2019-08-11 DIAGNOSIS — Z7901 Long term (current) use of anticoagulants: Secondary | ICD-10-CM | POA: Diagnosis not present

## 2019-08-11 DIAGNOSIS — I5032 Chronic diastolic (congestive) heart failure: Secondary | ICD-10-CM | POA: Diagnosis not present

## 2019-08-11 DIAGNOSIS — J189 Pneumonia, unspecified organism: Secondary | ICD-10-CM | POA: Diagnosis not present

## 2019-08-11 DIAGNOSIS — I700 Atherosclerosis of aorta: Secondary | ICD-10-CM | POA: Diagnosis not present

## 2019-08-11 DIAGNOSIS — N1832 Chronic kidney disease, stage 3b: Secondary | ICD-10-CM | POA: Diagnosis not present

## 2019-08-11 DIAGNOSIS — M479 Spondylosis, unspecified: Secondary | ICD-10-CM | POA: Diagnosis not present

## 2019-08-11 DIAGNOSIS — I2510 Atherosclerotic heart disease of native coronary artery without angina pectoris: Secondary | ICD-10-CM | POA: Diagnosis not present

## 2019-08-11 DIAGNOSIS — J9601 Acute respiratory failure with hypoxia: Secondary | ICD-10-CM | POA: Diagnosis not present

## 2019-08-12 ENCOUNTER — Inpatient Hospital Stay (INDEPENDENT_AMBULATORY_CARE_PROVIDER_SITE_OTHER): Payer: Medicare Other

## 2019-08-12 ENCOUNTER — Encounter: Payer: Self-pay | Admitting: Internal Medicine

## 2019-08-12 DIAGNOSIS — N1832 Chronic kidney disease, stage 3b: Secondary | ICD-10-CM | POA: Diagnosis not present

## 2019-08-12 DIAGNOSIS — I2510 Atherosclerotic heart disease of native coronary artery without angina pectoris: Secondary | ICD-10-CM | POA: Diagnosis not present

## 2019-08-12 DIAGNOSIS — I480 Paroxysmal atrial fibrillation: Secondary | ICD-10-CM

## 2019-08-12 DIAGNOSIS — I129 Hypertensive chronic kidney disease with stage 1 through stage 4 chronic kidney disease, or unspecified chronic kidney disease: Secondary | ICD-10-CM | POA: Diagnosis not present

## 2019-08-12 DIAGNOSIS — R7303 Prediabetes: Secondary | ICD-10-CM | POA: Diagnosis not present

## 2019-08-12 DIAGNOSIS — E1122 Type 2 diabetes mellitus with diabetic chronic kidney disease: Secondary | ICD-10-CM | POA: Diagnosis not present

## 2019-08-12 DIAGNOSIS — I5032 Chronic diastolic (congestive) heart failure: Secondary | ICD-10-CM | POA: Diagnosis not present

## 2019-08-12 DIAGNOSIS — I495 Sick sinus syndrome: Secondary | ICD-10-CM

## 2019-08-12 DIAGNOSIS — R600 Localized edema: Secondary | ICD-10-CM | POA: Diagnosis not present

## 2019-08-12 DIAGNOSIS — E782 Mixed hyperlipidemia: Secondary | ICD-10-CM | POA: Diagnosis not present

## 2019-08-12 DIAGNOSIS — I130 Hypertensive heart and chronic kidney disease with heart failure and stage 1 through stage 4 chronic kidney disease, or unspecified chronic kidney disease: Secondary | ICD-10-CM | POA: Diagnosis not present

## 2019-08-12 DIAGNOSIS — I0981 Rheumatic heart failure: Secondary | ICD-10-CM | POA: Diagnosis not present

## 2019-08-12 DIAGNOSIS — J189 Pneumonia, unspecified organism: Secondary | ICD-10-CM | POA: Diagnosis not present

## 2019-08-12 DIAGNOSIS — N400 Enlarged prostate without lower urinary tract symptoms: Secondary | ICD-10-CM | POA: Diagnosis not present

## 2019-08-12 DIAGNOSIS — I4891 Unspecified atrial fibrillation: Secondary | ICD-10-CM | POA: Diagnosis not present

## 2019-08-12 DIAGNOSIS — D638 Anemia in other chronic diseases classified elsewhere: Secondary | ICD-10-CM | POA: Diagnosis not present

## 2019-08-13 DIAGNOSIS — I5032 Chronic diastolic (congestive) heart failure: Secondary | ICD-10-CM | POA: Diagnosis not present

## 2019-08-13 DIAGNOSIS — E1122 Type 2 diabetes mellitus with diabetic chronic kidney disease: Secondary | ICD-10-CM | POA: Diagnosis not present

## 2019-08-13 DIAGNOSIS — J189 Pneumonia, unspecified organism: Secondary | ICD-10-CM | POA: Diagnosis not present

## 2019-08-13 DIAGNOSIS — I130 Hypertensive heart and chronic kidney disease with heart failure and stage 1 through stage 4 chronic kidney disease, or unspecified chronic kidney disease: Secondary | ICD-10-CM | POA: Diagnosis not present

## 2019-08-13 DIAGNOSIS — I2510 Atherosclerotic heart disease of native coronary artery without angina pectoris: Secondary | ICD-10-CM | POA: Diagnosis not present

## 2019-08-13 DIAGNOSIS — I0981 Rheumatic heart failure: Secondary | ICD-10-CM | POA: Diagnosis not present

## 2019-08-14 DIAGNOSIS — I495 Sick sinus syndrome: Secondary | ICD-10-CM | POA: Diagnosis not present

## 2019-08-14 DIAGNOSIS — I480 Paroxysmal atrial fibrillation: Secondary | ICD-10-CM | POA: Diagnosis not present

## 2019-08-15 ENCOUNTER — Telehealth: Payer: Self-pay | Admitting: Physician Assistant

## 2019-08-15 NOTE — Telephone Encounter (Signed)
Patient fibrosed from [place], phone #[phone].  While on the monitor, patient had been in and out of atrial fibrillation.  He had a episode of atrial fibrillation yesterday on 5/14, however by last night, he has converted back to normal sinus rhythm.  He had another episode of atrial fibrillation this morning as well, heart rate was between 103-137.  Average heart rate was 118.  He is on carvedilol and amiodarone therapy.  We will continue on the current therapy.

## 2019-08-18 DIAGNOSIS — I2510 Atherosclerotic heart disease of native coronary artery without angina pectoris: Secondary | ICD-10-CM | POA: Diagnosis not present

## 2019-08-18 DIAGNOSIS — I0981 Rheumatic heart failure: Secondary | ICD-10-CM | POA: Diagnosis not present

## 2019-08-18 DIAGNOSIS — J189 Pneumonia, unspecified organism: Secondary | ICD-10-CM | POA: Diagnosis not present

## 2019-08-18 DIAGNOSIS — E1122 Type 2 diabetes mellitus with diabetic chronic kidney disease: Secondary | ICD-10-CM | POA: Diagnosis not present

## 2019-08-18 DIAGNOSIS — I130 Hypertensive heart and chronic kidney disease with heart failure and stage 1 through stage 4 chronic kidney disease, or unspecified chronic kidney disease: Secondary | ICD-10-CM | POA: Diagnosis not present

## 2019-08-18 DIAGNOSIS — I5032 Chronic diastolic (congestive) heart failure: Secondary | ICD-10-CM | POA: Diagnosis not present

## 2019-08-19 NOTE — Telephone Encounter (Signed)
The pt is on amiodarone and carvedilol Until amio is fully "loaded" he will be in and out of afib   Rates may be higher In hospital he had some long pause on conversion   So, I do not want to boost doses for now

## 2019-08-20 DIAGNOSIS — I0981 Rheumatic heart failure: Secondary | ICD-10-CM | POA: Diagnosis not present

## 2019-08-20 DIAGNOSIS — E1122 Type 2 diabetes mellitus with diabetic chronic kidney disease: Secondary | ICD-10-CM | POA: Diagnosis not present

## 2019-08-20 DIAGNOSIS — I2510 Atherosclerotic heart disease of native coronary artery without angina pectoris: Secondary | ICD-10-CM | POA: Diagnosis not present

## 2019-08-20 DIAGNOSIS — J189 Pneumonia, unspecified organism: Secondary | ICD-10-CM | POA: Diagnosis not present

## 2019-08-20 DIAGNOSIS — I5032 Chronic diastolic (congestive) heart failure: Secondary | ICD-10-CM | POA: Diagnosis not present

## 2019-08-20 DIAGNOSIS — I130 Hypertensive heart and chronic kidney disease with heart failure and stage 1 through stage 4 chronic kidney disease, or unspecified chronic kidney disease: Secondary | ICD-10-CM | POA: Diagnosis not present

## 2019-08-21 NOTE — Telephone Encounter (Signed)
Called and spoke with the patient's daughter Adam [name] and informed her father Adam [name] cardiac monitor is showing he is in and out of afib and for him to continue his medication therapy as prescribed. She also spoke with Almyra Deforest, PA-C who was able to answer her questions and concerns she had at the time of our call.

## 2019-08-21 NOTE — Telephone Encounter (Signed)
Discussed with Dr. [name], we are aware that he is still flipping in and out of afib based on the heart monitor. Dr. [name] recommended continue on the current therapy.

## 2019-08-24 DIAGNOSIS — I2510 Atherosclerotic heart disease of native coronary artery without angina pectoris: Secondary | ICD-10-CM | POA: Diagnosis not present

## 2019-08-24 DIAGNOSIS — I130 Hypertensive heart and chronic kidney disease with heart failure and stage 1 through stage 4 chronic kidney disease, or unspecified chronic kidney disease: Secondary | ICD-10-CM | POA: Diagnosis not present

## 2019-08-24 DIAGNOSIS — I0981 Rheumatic heart failure: Secondary | ICD-10-CM | POA: Diagnosis not present

## 2019-08-24 DIAGNOSIS — I5032 Chronic diastolic (congestive) heart failure: Secondary | ICD-10-CM | POA: Diagnosis not present

## 2019-08-24 DIAGNOSIS — E1122 Type 2 diabetes mellitus with diabetic chronic kidney disease: Secondary | ICD-10-CM | POA: Diagnosis not present

## 2019-08-24 DIAGNOSIS — J189 Pneumonia, unspecified organism: Secondary | ICD-10-CM | POA: Diagnosis not present

## 2019-08-25 DIAGNOSIS — E1122 Type 2 diabetes mellitus with diabetic chronic kidney disease: Secondary | ICD-10-CM | POA: Diagnosis not present

## 2019-08-25 DIAGNOSIS — J189 Pneumonia, unspecified organism: Secondary | ICD-10-CM | POA: Diagnosis not present

## 2019-08-25 DIAGNOSIS — I5032 Chronic diastolic (congestive) heart failure: Secondary | ICD-10-CM | POA: Diagnosis not present

## 2019-08-25 DIAGNOSIS — I130 Hypertensive heart and chronic kidney disease with heart failure and stage 1 through stage 4 chronic kidney disease, or unspecified chronic kidney disease: Secondary | ICD-10-CM | POA: Diagnosis not present

## 2019-08-25 DIAGNOSIS — I2510 Atherosclerotic heart disease of native coronary artery without angina pectoris: Secondary | ICD-10-CM | POA: Diagnosis not present

## 2019-08-25 DIAGNOSIS — I0981 Rheumatic heart failure: Secondary | ICD-10-CM | POA: Diagnosis not present

## 2019-08-26 DIAGNOSIS — E1122 Type 2 diabetes mellitus with diabetic chronic kidney disease: Secondary | ICD-10-CM | POA: Diagnosis not present

## 2019-08-26 DIAGNOSIS — I2510 Atherosclerotic heart disease of native coronary artery without angina pectoris: Secondary | ICD-10-CM | POA: Diagnosis not present

## 2019-08-26 DIAGNOSIS — I130 Hypertensive heart and chronic kidney disease with heart failure and stage 1 through stage 4 chronic kidney disease, or unspecified chronic kidney disease: Secondary | ICD-10-CM | POA: Diagnosis not present

## 2019-08-26 DIAGNOSIS — J189 Pneumonia, unspecified organism: Secondary | ICD-10-CM | POA: Diagnosis not present

## 2019-08-26 DIAGNOSIS — I5032 Chronic diastolic (congestive) heart failure: Secondary | ICD-10-CM | POA: Diagnosis not present

## 2019-08-26 DIAGNOSIS — I0981 Rheumatic heart failure: Secondary | ICD-10-CM | POA: Diagnosis not present

## 2019-08-27 ENCOUNTER — Ambulatory Visit (INDEPENDENT_AMBULATORY_CARE_PROVIDER_SITE_OTHER): Payer: Medicare Other | Admitting: Ophthalmology

## 2019-08-27 ENCOUNTER — Encounter (INDEPENDENT_AMBULATORY_CARE_PROVIDER_SITE_OTHER): Payer: Self-pay | Admitting: Ophthalmology

## 2019-08-27 ENCOUNTER — Other Ambulatory Visit: Payer: Self-pay

## 2019-08-27 DIAGNOSIS — H401121 Primary open-angle glaucoma, left eye, mild stage: Secondary | ICD-10-CM | POA: Diagnosis not present

## 2019-08-27 DIAGNOSIS — H353211 Exudative age-related macular degeneration, right eye, with active choroidal neovascularization: Secondary | ICD-10-CM

## 2019-08-27 DIAGNOSIS — H35371 Puckering of macula, right eye: Secondary | ICD-10-CM | POA: Diagnosis not present

## 2019-08-27 DIAGNOSIS — E119 Type 2 diabetes mellitus without complications: Secondary | ICD-10-CM | POA: Diagnosis not present

## 2019-08-27 DIAGNOSIS — H43812 Vitreous degeneration, left eye: Secondary | ICD-10-CM

## 2019-08-27 DIAGNOSIS — H353113 Nonexudative age-related macular degeneration, right eye, advanced atrophic without subfoveal involvement: Secondary | ICD-10-CM

## 2019-08-27 DIAGNOSIS — H43821 Vitreomacular adhesion, right eye: Secondary | ICD-10-CM | POA: Diagnosis not present

## 2019-08-27 DIAGNOSIS — H353221 Exudative age-related macular degeneration, left eye, with active choroidal neovascularization: Secondary | ICD-10-CM | POA: Diagnosis not present

## 2019-08-27 MED ORDER — AFLIBERCEPT 2MG/0.05ML IZ SOLN FOR KALEIDOSCOPE
2.0000 mg | INTRAVITREAL | Status: AC | PRN
  Administered 2019-08-27: 2 mg via INTRAVITREAL

## 2019-08-27 NOTE — Assessment & Plan Note (Signed)
The nature of wet macular degeneration was discussed with the patient.  Forms of therapy reviewed include the use of Anti-VEGF medications injected painlessly into the eye, as well as other possible treatment modalities, including thermal laser therapy. Fellow eye involvement and risks were discussed with the patient. Upon the finding of wet age related macular degeneration, treatment will be offered. The treatment regimen is on a treat as needed basis with the intent to treat if necessary and extend interval of exams when possible. On average 1 out of 6 patients do not need lifetime therapy. However, the risk of recurrent disease is high for a lifetime.  Initially monthly, then periodic, examinations and evaluations will determine whether the next treatment is required on the day of the examination.  OS, improved and less active CNVM at 7-week interval status post intravitreal of Eylea will repeat today and examination in 8 weeks

## 2019-08-27 NOTE — Progress Notes (Signed)
08/27/2019     CHIEF COMPLAINT Patient presents for Retina Follow Up   HISTORY OF PRESENT ILLNESS: Adam [name] is a 86 y.o. male who presents to the clinic today for:   HPI    Retina Follow Up    Patient presents with  Wet AMD.  In left eye.  Severity is moderate.  Duration of 7 weeks.  Since onset it is stable.  I, the attending physician,  performed the HPI with the patient and updated documentation appropriately.          Comments    7 Week AMD f\u OS. Possible Eylea OS. Oct  Pt states no changes in vision. Pt is now taking a blood thinnner       Last edited by Tilda Franco on 08/27/2019 10:34 AM. (History)      Referring physician: Javier Glazier, MD [place],  [place] [address]  HISTORICAL INFORMATION:   Selected notes from the MEDICAL RECORD NUMBER    Lab Results  Component Value Date   HGBA1C 5.7 (H) 08/03/2019     CURRENT MEDICATIONS: Current Outpatient Medications (Ophthalmic Drugs)  Medication Sig  . brimonidine-timolol (COMBIGAN) 0.2-0.5 % ophthalmic solution Place 1 drop into the left eye 2 (two) times daily.   . Netarsudil-Latanoprost (ROCKLATAN) 0.02-0.005 % SOLN Place 1 drop into the left eye at bedtime.    No current facility-administered medications for this visit. (Ophthalmic Drugs)   Current Outpatient Medications (Other)  Medication Sig  . amiodarone (PACERONE) 200 MG tablet Take 1 tablet (200 mg total) by mouth 2 (two) times daily.  Marland Kitchen amLODipine (NORVASC) 5 MG tablet Take 1 tablet (5 mg total) by mouth daily.  Marland Kitchen apixaban (ELIQUIS) 2.5 MG TABS tablet Take 1 tablet (2.5 mg total) by mouth 2 (two) times daily.  Marland Kitchen atorvastatin (LIPITOR) 20 MG tablet Take 1 tablet (20 mg total) by mouth daily.  . betamethasone dipropionate (DIPROLENE) 0.05 % ointment Apply topically daily.   . carvedilol (COREG) 6.25 MG tablet Take 1 tablet (6.25 mg total) by mouth 2 (two) times daily with a meal.  . clobetasol cream (TEMOVATE) 6.04 % Apply 1  application topically every evening.  . finasteride (PROSCAR) 5 MG tablet Take 5 mg by mouth daily.   . furosemide (LASIX) 40 MG tablet Take 1 tablet (40 mg total) by mouth 2 (two) times daily.  . hydrALAZINE (APRESOLINE) 100 MG tablet Take 1 tablet (100 mg total) by mouth every 8 (eight) hours.  . Multiple Vitamins-Minerals (PRESERVISION AREDS PO) Take 1 tablet by mouth 2 (two) times daily.   Marland Kitchen omeprazole (PRILOSEC OTC) 20 MG tablet Take 20 mg by mouth daily.   Marland Kitchen ULORIC 40 MG tablet Take 40 mg by mouth See admin instructions. on Tuesday, Friday, and Sunday   No current facility-administered medications for this visit. (Other)      REVIEW OF SYSTEMS:    ALLERGIES No Known Allergies  PAST MEDICAL HISTORY Past Medical History:  Diagnosis Date  . Acute meniscal tear of knee RIGHT KNEE  . Arthritis   . BPH (benign prostatic hypertrophy)   . Colon polyps    hyperplastic  . Diabetes mellitus type 2, diet-controlled (HCC) AND EXERCISE-- LAST A1C  5.6  . Diverticulosis   . Gait abnormality 11/08/2016  . GERD (gastroesophageal reflux disease)   . Glaucoma   . H/O hiatal hernia   . Heart murmur   . History of esophageal stricture S/P DILATION IN 2007  .  Hyperlipidemia   . Hypertension   . IgM monoclonal gammopathy of uncertain significance ASYMPTOMATIC    FOLLOWED BY DR Alen Blew  . Internal hemorrhoids   . Macrocytic anemia DUE TO CHRONIC RENAL INSUFF.  . Nocturia   . Right knee pain    Past Surgical History:  Procedure Laterality Date  . CATARACT EXTRACTION W/ INTRAOCULAR LENS  IMPLANT, BILATERAL  2012  . HEMORRHOID SURGERY    . HERNIA REPAIR    . KNEE ARTHROSCOPY  12/25/2011   Procedure: ARTHROSCOPY KNEE;  Surgeon: Tobi Bastos, MD;  Location: [facility];  Service: Orthopedics;  Laterality: Right;  right knee arthroscopy with medial menisectomy      FAMILY HISTORY Family History  Problem Relation Age of Onset  . Diabetes Brother   . Heart attack  Father   . Early death Father   . Hearing loss Father   . Heart disease Father   . Alcohol abuse Sister   . Cancer Sister     SOCIAL HISTORY Social History   Tobacco Use  . Smoking status: Former Smoker    Packs/day: 1.00    Years: 10.00    Pack years: 10.00    Types: Cigarettes  . Smokeless tobacco: Never Used  Substance Use Topics  . Alcohol use: Yes    Alcohol/week: 14.0 standard drinks    Types: 14 Standard drinks or equivalent per week  . Drug use: No         OPHTHALMIC EXAM: Base Eye Exam    Visual Acuity (Snellen - Linear)      Right Left   Dist [place] 20/80 + 20/30   Dist ph [place] 20/50 +        Tonometry (Tonopen, 10:40 AM)      Right Left   Pressure 11 9       Pupils      Pupils Dark Light Shape React APD   Right PERRL 4 3 Round Brisk None   Left PERRL 3 2 Round Brisk None       Neuro/Psych    Oriented x3: Yes   Mood/Affect: Normal       Dilation    Left eye: 1.0% Mydriacyl, 2.5% Phenylephrine @ 10:40 AM        Slit Lamp and Fundus Exam    External Exam      Right Left   External  Normal       Slit Lamp Exam      Right Left   Lids/Lashes Normal Normal   Conjunctiva/Sclera White and quiet White and quiet   Cornea Clear Clear   Anterior Chamber Deep and quiet Deep and quiet   Iris Round and reactive Round and reactive   Lens Clear Posterior chamber intraocular lens   Anterior Vitreous Normal Normal       Fundus Exam      Right Left   Posterior Vitreous Normal Normal   Disc  Normal   C/D Ratio  0.3   Macula  Atrophy, Age related macular degeneration, Early age related macular degeneration, Macular thickening improved   Vessels  Normal   Periphery  Normal          IMAGING AND PROCEDURES  Imaging and Procedures for 08/27/19           ASSESSMENT/PLAN:  No problem-specific Assessment & Plan notes found for this encounter.      ICD-10-CM   1. Exudative age-related macular degeneration of left eye with active choroidal  neovascularization ([place])  H35.3221 OCT, Retina - OU - Both Eyes  2. Exudative age-related macular degeneration of right eye with active choroidal neovascularization (HCC)  H35.3211 OCT, Retina - OU - Both Eyes    1.OS, improved and less active CNVM at 7-week interval status post intravitreal of Eylea will repeat today and examination in 8 weeks  2.  OD, repeat examination and possible injection antiveg F as scheduled  3.  Ophthalmic Meds Ordered this visit:  No orders of the defined types were placed in this encounter.      No follow-ups on file.  There are no Patient Instructions on file for this visit.   Explained the diagnoses, plan, and follow up with the patient and they expressed understanding.  Patient expressed understanding of the importance of proper follow up care.   Clent Demark Kissie Ziolkowski M.D. Diseases & Surgery of the Retina and Vitreous [facility] 08/27/19     Abbreviations: M myopia (nearsighted); A astigmatism; H hyperopia (farsighted); P presbyopia; Mrx spectacle prescription;  CTL contact lenses; OD right eye; OS left eye; OU both eyes  XT exotropia; ET esotropia; PEK punctate epithelial keratitis; PEE punctate epithelial erosions; DES dry eye syndrome; MGD meibomian gland dysfunction; ATs artificial tears; PFAT's preservative free artificial tears; [place] nuclear sclerotic cataract; PSC posterior subcapsular cataract; ERM epi-retinal membrane; PVD posterior vitreous detachment; RD retinal detachment; DM diabetes mellitus; DR diabetic retinopathy; NPDR non-proliferative diabetic retinopathy; PDR proliferative diabetic retinopathy; CSME clinically significant macular edema; DME diabetic macular edema; dbh dot blot hemorrhages; CWS cotton wool spot; POAG primary open angle glaucoma; C/D cup-to-disc ratio; HVF humphrey visual field; GVF goldmann visual field; OCT optical coherence tomography; IOP intraocular pressure; BRVO Branch retinal vein occlusion; CRVO  central retinal vein occlusion; CRAO central retinal artery occlusion; BRAO branch retinal artery occlusion; RT retinal tear; SB scleral buckle; PPV pars plana vitrectomy; VH Vitreous hemorrhage; PRP panretinal laser photocoagulation; IVK intravitreal kenalog; VMT vitreomacular traction; MH Macular hole;  NVD neovascularization of the disc; NVE neovascularization elsewhere; AREDS age related eye disease study; ARMD age related macular degeneration; POAG primary open angle glaucoma; EBMD epithelial/anterior basement membrane dystrophy; ACIOL anterior chamber intraocular lens; IOL intraocular lens; PCIOL posterior chamber intraocular lens; Phaco/IOL phacoemulsification with intraocular lens placement; [place] photorefractive keratectomy; LASIK laser assisted in situ keratomileusis; HTN hypertension; DM diabetes mellitus; COPD chronic obstructive pulmonary disease

## 2019-09-01 DIAGNOSIS — I130 Hypertensive heart and chronic kidney disease with heart failure and stage 1 through stage 4 chronic kidney disease, or unspecified chronic kidney disease: Secondary | ICD-10-CM | POA: Diagnosis not present

## 2019-09-01 DIAGNOSIS — I0981 Rheumatic heart failure: Secondary | ICD-10-CM | POA: Diagnosis not present

## 2019-09-01 DIAGNOSIS — I5032 Chronic diastolic (congestive) heart failure: Secondary | ICD-10-CM | POA: Diagnosis not present

## 2019-09-01 DIAGNOSIS — J189 Pneumonia, unspecified organism: Secondary | ICD-10-CM | POA: Diagnosis not present

## 2019-09-01 DIAGNOSIS — E1122 Type 2 diabetes mellitus with diabetic chronic kidney disease: Secondary | ICD-10-CM | POA: Diagnosis not present

## 2019-09-01 DIAGNOSIS — I2510 Atherosclerotic heart disease of native coronary artery without angina pectoris: Secondary | ICD-10-CM | POA: Diagnosis not present

## 2019-09-02 DIAGNOSIS — I130 Hypertensive heart and chronic kidney disease with heart failure and stage 1 through stage 4 chronic kidney disease, or unspecified chronic kidney disease: Secondary | ICD-10-CM | POA: Diagnosis not present

## 2019-09-02 DIAGNOSIS — I0981 Rheumatic heart failure: Secondary | ICD-10-CM | POA: Diagnosis not present

## 2019-09-02 DIAGNOSIS — J189 Pneumonia, unspecified organism: Secondary | ICD-10-CM | POA: Diagnosis not present

## 2019-09-02 DIAGNOSIS — E1122 Type 2 diabetes mellitus with diabetic chronic kidney disease: Secondary | ICD-10-CM | POA: Diagnosis not present

## 2019-09-02 DIAGNOSIS — I5032 Chronic diastolic (congestive) heart failure: Secondary | ICD-10-CM | POA: Diagnosis not present

## 2019-09-02 DIAGNOSIS — I2510 Atherosclerotic heart disease of native coronary artery without angina pectoris: Secondary | ICD-10-CM | POA: Diagnosis not present

## 2019-09-03 NOTE — Progress Notes (Signed)
Cardiology Office Note    Date:  09/04/2019   ID:  Adam [name], DOB 1931-02-27, MRN [id number]  PCP:  Javier Glazier, MD  Cardiologist:  Dorris Carnes, MD [facility] EP: Dr. Lovena Le  Chief Complaint: Hospital follow up   History of Present Illness:   Adam [name] is a 86 y.o. male  with a hx of hypertension, diet-controlled diabetes mellitus, GERD, BPH, chronic kidney disease IV,  Aortic stenosis, gait imbalance and recent diagnosis of afib seen for hospital follow up.   Establish care with Dr. [name] in 2017 for dizziness and found to have orthostatic hypotension.  Admitted January 2020 with altered mental status and hypoxia in setting of hypertensive urgency. MRI/MRA of the brain showed no acute signs of a stroke, but did show some focal moderate proximal left M1 stenosis with moderate proximal right P2 stenosis.  Neurology concern for hypertensive encephalopathyandless likely TIA.  Follow-up 30-day event monitor without arrhythmia.  Admitted 08/2019 for pneumonia. Also found to have afib RVR with post conversion pause up to 4.9 sec. Seen by Dr. Lovena Le for intermittent afib. Started on Amiodarone and Eliquis.  Also seen by nephrologist.  Pending final result of Zio monitor. He did had episode of afib on 5/15 at HR 103-137 (stip scanned) and Dr. [name] recommended continuation of current therapy due to hx of post conversion pause.   Here today for follow up with daughter.  Instead of Lasix 40 mg twice daily he has been taking 20 mg twice daily because he was unable to urinate.  Since then he has progressively worsening lower extremity edema.  Sleeps chronically on one pillow.  Denies orthopnea or PND.  Seems like congested on upper chest.  Coughing white sputum.  Unable to eat.  Seems full belly.  He is nauseated.  He is resides at [facility].    Past Medical History:  Diagnosis Date  . Acute meniscal tear of knee RIGHT KNEE  . Arthritis   . BPH (benign prostatic hypertrophy)    . Colon polyps    hyperplastic  . Diabetes mellitus type 2, diet-controlled (HCC) AND EXERCISE-- LAST A1C  5.6  . Diverticulosis   . Gait abnormality 11/08/2016  . GERD (gastroesophageal reflux disease)   . Glaucoma   . H/O hiatal hernia   . Heart murmur   . History of esophageal stricture S/P DILATION IN 2007  . Hyperlipidemia   . Hypertension   . IgM monoclonal gammopathy of uncertain significance ASYMPTOMATIC    FOLLOWED BY DR Alen Blew  . Internal hemorrhoids   . Macrocytic anemia DUE TO CHRONIC RENAL INSUFF.  . Nocturia   . Right knee pain     Past Surgical History:  Procedure Laterality Date  . CATARACT EXTRACTION W/ INTRAOCULAR LENS  IMPLANT, BILATERAL  2012  . HEMORRHOID SURGERY    . HERNIA REPAIR    . KNEE ARTHROSCOPY  12/25/2011   Procedure: ARTHROSCOPY KNEE;  Surgeon: Tobi Bastos, MD;  [facility];  Service: Orthopedics;  Laterality: Right;  right knee arthroscopy with medial menisectomy      Current Medications: Prior to Admission medications   Medication Sig Start Date End Date Taking? Authorizing Provider  amiodarone (PACERONE) 200 MG tablet Take 1 tablet (200 mg total) by mouth 2 (two) times daily. 08/09/19 09/08/19  Dessa Phi, DO  amLODipine (NORVASC) 5 MG tablet Take 1 tablet (5 mg total) by mouth daily. 08/10/19   Dessa Phi, DO  apixaban (ELIQUIS) 2.5 MG  TABS tablet Take 1 tablet (2.5 mg total) by mouth 2 (two) times daily. 08/09/19   Dessa Phi, DO  atorvastatin (LIPITOR) 20 MG tablet Take 1 tablet (20 mg total) by mouth daily. 05/03/18   Norval Morton, MD  betamethasone dipropionate (DIPROLENE) 0.05 % ointment Apply topically daily.  04/27/19   [provider]  brimonidine-timolol (COMBIGAN) 0.2-0.5 % ophthalmic solution Place 1 drop into the left eye 2 (two) times daily.     [provider]  carvedilol (COREG) 6.25 MG tablet Take 1 tablet (6.25 mg total) by mouth 2 (two) times daily with a meal. 08/09/19    Dessa Phi, DO  clobetasol cream (TEMOVATE) 1.77 % Apply 1 application topically every evening. 05/03/16   [provider]  finasteride (PROSCAR) 5 MG tablet Take 5 mg by mouth daily.     [provider]  furosemide (LASIX) 40 MG tablet Take 1 tablet (40 mg total) by mouth 2 (two) times daily. 08/09/19   Dessa Phi, DO  hydrALAZINE (APRESOLINE) 100 MG tablet Take 1 tablet (100 mg total) by mouth every 8 (eight) hours. 08/09/19 09/08/19  Dessa Phi, DO  Multiple Vitamins-Minerals (PRESERVISION AREDS PO) Take 1 tablet by mouth 2 (two) times daily.     [provider]  Netarsudil-Latanoprost (ROCKLATAN) 0.02-0.005 % SOLN Place 1 drop into the left eye at bedtime.     [provider]  omeprazole (PRILOSEC OTC) 20 MG tablet Take 20 mg by mouth daily.  05/13/19   [provider]  ULORIC 40 MG tablet Take 40 mg by mouth See admin instructions. on Tuesday, Friday, and Sunday    [provider]    Allergies:   Patient has no known allergies.   Social History   Socioeconomic History  . Marital status: Widowed    Spouse name: Mechele Claude  . Number of children: 2  . Years of education: 66  . Highest education level: Not on file  Occupational History  . Not on file  Tobacco Use  . Smoking status: Former Smoker    Packs/day: 1.00    Years: 10.00    Pack years: 10.00    Types: Cigarettes  . Smokeless tobacco: Never Used  Substance and Sexual Activity  . Alcohol use: Yes    Alcohol/week: 14.0 standard drinks    Types: 14 Standard drinks or equivalent per week  . Drug use: No  . Sexual activity: Not on file  Other Topics Concern  . Not on file  Social History Narrative   Lives with wife, Mechele Claude   Caffeine use: Diet coke daily   Right-handed   Social Determinants of Health   Financial Resource Strain:   . Difficulty of Paying Living Expenses:   Food Insecurity:   . Worried About Charity fundraiser in the Last Year:   . Academic librarian in the Last Year:   Transportation Needs:   . Film/video editor (Medical):   Marland Kitchen Lack of Transportation (Non-Medical):   Physical Activity:   . Days of Exercise per Week:   . Minutes of Exercise per Session:   Stress:   . Feeling of Stress :   Social Connections:   . Frequency of Communication with Friends and Family:   . Frequency of Social Gatherings with Friends and Family:   . Attends Religious Services:   . Active Member of Clubs or Organizations:   . Attends Archivist Meetings:   Marland Kitchen Marital Status:  Family History:  The patient's family history includes Alcohol abuse in his sister; Cancer in his sister; Diabetes in his brother; Early death in his father; Hearing loss in his father; Heart attack in his father; Heart disease in his father.   ROS:   Please see the history of present illness.    ROS All other systems reviewed and are negative.   PHYSICAL EXAM:   VS:  BP (!) 114/48   Pulse 76   Ht 5\' 4"  (1.626 m)   Wt 158 lb 12.8 oz (72 kg)   SpO2 96%   BMI 27.26 kg/m    GEN: Well nourished, well developed, in no acute distress  HEENT: normal  Neck: no JVD, carotid bruits, or masses Cardiac: RRR; 3/6 systolic murmurs, rubs, or gallops, 2+ bilateral lower extremity edema extending up to abdomen Respiratory:  clear to auscultation bilaterally, normal work of breathing GI: soft, nontender, distended, + BS MS: no deformity or atrophy  Skin: warm and dry, no rash Neuro:  Alert and Oriented x 3, Strength and sensation are intact Psych: euthymic mood, full affect  Wt Readings from Last 3 Encounters:  09/04/19 158 lb 12.8 oz (72 kg)  08/03/19 160 lb 7.9 oz (72.8 kg)  05/21/18 149 lb (67.6 kg)      Studies/Labs Reviewed:   EKG:  EKG is ordered today.  The ekg ordered today demonstrates *normal sinus rhythm with right bundle branch block Recent Labs: 08/04/2019: TSH 2.985 08/05/2019: B Natriuretic Peptide 1,476.0; Magnesium 2.0 08/07/2019: ALT  21 08/09/2019: BUN 44; Creatinine, Ser 2.49; Hemoglobin 10.2; Platelets 252; Potassium 3.0; Sodium 138   Lipid Panel    Component Value Date/Time   CHOL 106 08/05/2019 0359   TRIG 85 08/05/2019 0359   HDL 33 (L) 08/05/2019 0359   CHOLHDL 3.2 08/05/2019 0359   VLDL 17 08/05/2019 0359   LDLCALC 56 08/05/2019 0359    Additional studies/ records that were reviewed today include:   Echocardiogram: 08/04/19 1. Left ventricular ejection fraction, by estimation, is 60 to 65%. The  left ventricle has normal function. The left ventricle has no regional  wall motion abnormalities. Left ventricular diastolic parameters are  consistent with Grade II diastolic  dysfunction (pseudonormalization).  2. Right ventricular systolic function is normal. The right ventricular  size is normal.  3. Left atrial size was mild to moderately dilated.  4. Right atrial size was mildly dilated.  5. The mitral valve is grossly normal. Mild mitral valve regurgitation.  No evidence of mitral stenosis.  6. The aortic valve is abnormal. Aortic valve regurgitation is trivial.  Moderate aortic valve stenosis.  7. The inferior vena cava is dilated in size with >50% respiratory  variability, suggesting right atrial pressure of 8 mmHg.    ASSESSMENT & PLAN:    1. Paroxysmal atrial fibrillation with postconversion pauses Maintaining sinus rhythm.  Pending result of monitor.  Continue current dose of amiodarone 200 mg twice daily.  He has nausea and early satiety.  Questionable side effect of amiodarone versus volume overload.  Pending lab.  Will make adjustment based on that.  2.  Acute on chronic diastolic heart failure Patient has volume overload by exam and symptoms.  Advised to go back to Lasix 40 mg twice daily as recommended by nephrologist.  Check lab.  3.  Chronic kidney disease stage III/IV -Recheck renal function -Daughter will make appointment with nephrologist  4.  Moderate aortic stenosis -  Aortic valve mean gradient  measures 30.8 mmHg. Aortic  valve peak gradient measures 51.2 mmHg. Aortic  valve area, by VTI measures 0.75 cm.   5.  Hypertension -Initial blood pressure 114/48 -Repeat blood pressure by myself 120/60 -Continue current dose of amlodipine at 5 mg daily, Coreg 6.25 mg twice daily, and hydralazine 100 mg 3 times daily     Medication Adjustments/Labs and Tests Ordered: Current medicines are reviewed at length with the patient today.  Concerns regarding medicines are outlined above.  Medication changes, Labs and Tests ordered today are listed in the Patient Instructions below. Patient Instructions  Medication Instructions:  Your physician recommends that you continue on your current medications as directed. Please refer to the Current Medication list given to you today.  *If you need a refill on your cardiac medications before your next appointment, please call your pharmacy*   Lab Work: TODAY:  STAT:  CMET, PRO BNP, & CBC  If you have labs (blood work) drawn today and your tests are completely normal, you will receive your results only by: Marland Kitchen MyChart Message (if you have MyChart) OR . A paper copy in the mail If you have any lab test that is abnormal or we need to change your treatment, we will call you to review the results.   Testing/Procedures: None ordered   Follow-Up: At [facility], you and your health needs are our priority.  As part of our continuing mission to provide you with exceptional heart care, we have created designated Provider Care Teams.  These Care Teams include your primary Cardiologist (physician) and Advanced Practice Providers (APPs -  Physician Assistants and Nurse Practitioners) who all work together to provide you with the care you need, when you need it.  We recommend signing up for the patient portal called "MyChart".  Sign up information is provided on this After Visit Summary.  MyChart is used to connect with patients for  Virtual Visits (Telemedicine).  Patients are able to view lab/test results, encounter notes, upcoming appointments, etc.  Non-urgent messages can be sent to your provider as well.   To learn more about what you can do with MyChart, go to [email or web address].    Your next appointment:   Will depend upon the lab results.    Jarrett Soho, [place]  09/04/2019 11:09 AM    [facility] [place], [place], [place]  [address] Phone: [phone]; Fax: [phone]

## 2019-09-04 ENCOUNTER — Encounter: Payer: Self-pay | Admitting: Physician Assistant

## 2019-09-04 ENCOUNTER — Other Ambulatory Visit: Payer: Self-pay

## 2019-09-04 ENCOUNTER — Other Ambulatory Visit: Payer: Self-pay | Admitting: Internal Medicine

## 2019-09-04 ENCOUNTER — Ambulatory Visit (INDEPENDENT_AMBULATORY_CARE_PROVIDER_SITE_OTHER): Payer: Medicare Other | Admitting: Physician Assistant

## 2019-09-04 ENCOUNTER — Inpatient Hospital Stay (HOSPITAL_COMMUNITY)
Admission: AD | Admit: 2019-09-04 | Discharge: 2019-09-08 | DRG: 291 | Disposition: A | Discharge status: Home Health | Payer: Medicare Other | Source: Ambulatory Visit | Attending: Internal Medicine | Admitting: Internal Medicine

## 2019-09-04 VITALS — BP 114/48 | HR 76 | Ht 64.0 in | Wt 158.8 lb

## 2019-09-04 DIAGNOSIS — Z811 Family history of alcohol abuse and dependence: Secondary | ICD-10-CM

## 2019-09-04 DIAGNOSIS — N400 Enlarged prostate without lower urinary tract symptoms: Secondary | ICD-10-CM | POA: Diagnosis present

## 2019-09-04 DIAGNOSIS — Z9114 Patient's other noncompliance with medication regimen: Secondary | ICD-10-CM

## 2019-09-04 DIAGNOSIS — Z87891 Personal history of nicotine dependence: Secondary | ICD-10-CM

## 2019-09-04 DIAGNOSIS — K219 Gastro-esophageal reflux disease without esophagitis: Secondary | ICD-10-CM | POA: Diagnosis present

## 2019-09-04 DIAGNOSIS — R55 Syncope and collapse: Secondary | ICD-10-CM

## 2019-09-04 DIAGNOSIS — I350 Nonrheumatic aortic (valve) stenosis: Secondary | ICD-10-CM | POA: Diagnosis present

## 2019-09-04 DIAGNOSIS — E8770 Fluid overload, unspecified: Secondary | ICD-10-CM | POA: Diagnosis not present

## 2019-09-04 DIAGNOSIS — Z7289 Other problems related to lifestyle: Secondary | ICD-10-CM

## 2019-09-04 DIAGNOSIS — D472 Monoclonal gammopathy: Secondary | ICD-10-CM | POA: Diagnosis present

## 2019-09-04 DIAGNOSIS — E1122 Type 2 diabetes mellitus with diabetic chronic kidney disease: Secondary | ICD-10-CM | POA: Diagnosis present

## 2019-09-04 DIAGNOSIS — D631 Anemia in chronic kidney disease: Secondary | ICD-10-CM | POA: Diagnosis present

## 2019-09-04 DIAGNOSIS — Z8249 Family history of ischemic heart disease and other diseases of the circulatory system: Secondary | ICD-10-CM | POA: Diagnosis not present

## 2019-09-04 DIAGNOSIS — D509 Iron deficiency anemia, unspecified: Secondary | ICD-10-CM | POA: Diagnosis present

## 2019-09-04 DIAGNOSIS — I495 Sick sinus syndrome: Secondary | ICD-10-CM | POA: Diagnosis not present

## 2019-09-04 DIAGNOSIS — R0602 Shortness of breath: Secondary | ICD-10-CM

## 2019-09-04 DIAGNOSIS — D539 Nutritional anemia, unspecified: Secondary | ICD-10-CM | POA: Diagnosis present

## 2019-09-04 DIAGNOSIS — I5033 Acute on chronic diastolic (congestive) heart failure: Secondary | ICD-10-CM | POA: Diagnosis present

## 2019-09-04 DIAGNOSIS — Z20822 Contact with and (suspected) exposure to covid-19: Secondary | ICD-10-CM | POA: Diagnosis present

## 2019-09-04 DIAGNOSIS — N184 Chronic kidney disease, stage 4 (severe): Secondary | ICD-10-CM | POA: Diagnosis present

## 2019-09-04 DIAGNOSIS — I10 Essential (primary) hypertension: Secondary | ICD-10-CM | POA: Diagnosis not present

## 2019-09-04 DIAGNOSIS — I480 Paroxysmal atrial fibrillation: Secondary | ICD-10-CM

## 2019-09-04 DIAGNOSIS — T501X6A Underdosing of loop [high-ceiling] diuretics, initial encounter: Secondary | ICD-10-CM | POA: Diagnosis present

## 2019-09-04 DIAGNOSIS — R059 Cough, unspecified: Secondary | ICD-10-CM

## 2019-09-04 DIAGNOSIS — H409 Unspecified glaucoma: Secondary | ICD-10-CM | POA: Diagnosis present

## 2019-09-04 DIAGNOSIS — R05 Cough: Secondary | ICD-10-CM | POA: Diagnosis not present

## 2019-09-04 DIAGNOSIS — N179 Acute kidney failure, unspecified: Secondary | ICD-10-CM | POA: Diagnosis present

## 2019-09-04 DIAGNOSIS — I130 Hypertensive heart and chronic kidney disease with heart failure and stage 1 through stage 4 chronic kidney disease, or unspecified chronic kidney disease: Principal | ICD-10-CM | POA: Diagnosis present

## 2019-09-04 DIAGNOSIS — Z7901 Long term (current) use of anticoagulants: Secondary | ICD-10-CM | POA: Diagnosis not present

## 2019-09-04 DIAGNOSIS — E785 Hyperlipidemia, unspecified: Secondary | ICD-10-CM | POA: Diagnosis present

## 2019-09-04 DIAGNOSIS — I4819 Other persistent atrial fibrillation: Secondary | ICD-10-CM | POA: Diagnosis present

## 2019-09-04 DIAGNOSIS — Z833 Family history of diabetes mellitus: Secondary | ICD-10-CM | POA: Diagnosis not present

## 2019-09-04 DIAGNOSIS — Z79899 Other long term (current) drug therapy: Secondary | ICD-10-CM

## 2019-09-04 DIAGNOSIS — N1832 Chronic kidney disease, stage 3b: Secondary | ICD-10-CM

## 2019-09-04 DIAGNOSIS — I4891 Unspecified atrial fibrillation: Secondary | ICD-10-CM | POA: Diagnosis not present

## 2019-09-04 HISTORY — DX: Other persistent atrial fibrillation: I48.19

## 2019-09-04 LAB — MRSA PCR SCREENING: MRSA by PCR: NEGATIVE

## 2019-09-04 LAB — GLUCOSE, CAPILLARY
Glucose-Capillary: 130 mg/dL — ABNORMAL HIGH (ref 70–99)
Glucose-Capillary: 141 mg/dL — ABNORMAL HIGH (ref 70–99)

## 2019-09-04 LAB — SARS CORONAVIRUS 2 BY RT PCR (HOSPITAL ORDER, PERFORMED IN ~~LOC~~ HOSPITAL LAB): SARS Coronavirus 2: NEGATIVE

## 2019-09-04 MED ORDER — OMEPRAZOLE MAGNESIUM 20 MG PO TBEC: 20.0000 mg | DELAYED_RELEASE_TABLET | Freq: Every day | ORAL | Status: DC

## 2019-09-04 MED ORDER — NETARSUDIL-LATANOPROST 0.02-0.005 % OP SOLN
1.0000 [drp] | Freq: Every day | OPHTHALMIC | Status: DC
  Administered 2019-09-05 – 2019-09-07 (×3): 1 [drp] via OPHTHALMIC

## 2019-09-04 MED ORDER — ACETAMINOPHEN 325 MG PO TABS: 650.0000 mg | ORAL_TABLET | ORAL | Status: DC | PRN

## 2019-09-04 MED ORDER — FUROSEMIDE 10 MG/ML IJ SOLN
80.0000 mg | Freq: Two times a day (BID) | INTRAMUSCULAR | Status: DC
  Administered 2019-09-04 – 2019-09-05 (×2): 80 mg via INTRAVENOUS
  Filled 2019-09-04 (×2): qty 8

## 2019-09-04 MED ORDER — PANTOPRAZOLE SODIUM 40 MG PO TBEC
40.0000 mg | DELAYED_RELEASE_TABLET | Freq: Every day | ORAL | Status: DC
  Administered 2019-09-05 – 2019-09-08 (×4): 40 mg via ORAL
  Filled 2019-09-04 (×4): qty 1

## 2019-09-04 MED ORDER — FINASTERIDE 5 MG PO TABS
5.0000 mg | ORAL_TABLET | Freq: Every day | ORAL | Status: DC
  Administered 2019-09-05 – 2019-09-08 (×4): 5 mg via ORAL
  Filled 2019-09-04 (×4): qty 1

## 2019-09-04 MED ORDER — TRIAMCINOLONE ACETONIDE 0.5 % EX CREA: TOPICAL_CREAM | Freq: Every day | CUTANEOUS | Status: DC

## 2019-09-04 MED ORDER — HYDRALAZINE HCL 25 MG PO TABS
100.0000 mg | ORAL_TABLET | Freq: Three times a day (TID) | ORAL | Status: DC
  Administered 2019-09-04 – 2019-09-08 (×11): 100 mg via ORAL
  Filled 2019-09-04 (×11): qty 4

## 2019-09-04 MED ORDER — BRIMONIDINE TARTRATE 0.2 % OP SOLN
1.0000 [drp] | Freq: Two times a day (BID) | OPHTHALMIC | Status: DC
  Filled 2019-09-04: qty 5

## 2019-09-04 MED ORDER — ATORVASTATIN CALCIUM 10 MG PO TABS
20.0000 mg | ORAL_TABLET | Freq: Every day | ORAL | Status: DC
  Administered 2019-09-05 – 2019-09-08 (×4): 20 mg via ORAL
  Filled 2019-09-04 (×4): qty 2

## 2019-09-04 MED ORDER — BRIMONIDINE TARTRATE-TIMOLOL 0.2-0.5 % OP SOLN
1.0000 [drp] | Freq: Two times a day (BID) | OPHTHALMIC | Status: DC
  Administered 2019-09-05 – 2019-09-08 (×7): 1 [drp] via OPHTHALMIC

## 2019-09-04 MED ORDER — BRIMONIDINE TARTRATE-TIMOLOL 0.2-0.5 % OP SOLN: 1.0000 [drp] | Freq: Two times a day (BID) | OPHTHALMIC | Status: DC

## 2019-09-04 MED ORDER — AMIODARONE HCL 200 MG PO TABS
200.0000 mg | ORAL_TABLET | Freq: Two times a day (BID) | ORAL | Status: DC
  Administered 2019-09-04: 200 mg via ORAL
  Filled 2019-09-04: qty 1

## 2019-09-04 MED ORDER — NITROGLYCERIN 0.4 MG SL SUBL: 0.4000 mg | SUBLINGUAL_TABLET | SUBLINGUAL | Status: DC | PRN

## 2019-09-04 MED ORDER — ONDANSETRON HCL 4 MG/2ML IJ SOLN: 4.0000 mg | Freq: Four times a day (QID) | INTRAMUSCULAR | Status: DC | PRN

## 2019-09-04 MED ORDER — APIXABAN 2.5 MG PO TABS
2.5000 mg | ORAL_TABLET | Freq: Two times a day (BID) | ORAL | Status: DC
  Administered 2019-09-04 – 2019-09-08 (×8): 2.5 mg via ORAL
  Filled 2019-09-04 (×8): qty 1

## 2019-09-04 MED ORDER — TIMOLOL MALEATE 0.5 % OP SOLN
1.0000 [drp] | Freq: Two times a day (BID) | OPHTHALMIC | Status: DC
  Filled 2019-09-04: qty 5

## 2019-09-04 NOTE — Patient Instructions (Signed)
Medication Instructions:  Your physician recommends that you continue on your current medications as directed. Please refer to the Current Medication list given to you today.  *If you need a refill on your cardiac medications before your next appointment, please call your pharmacy*   Lab Work: TODAY:  STAT:  CMET, PRO BNP, & CBC  If you have labs (blood work) drawn today and your tests are completely normal, you will receive your results only by: Marland Kitchen MyChart Message (if you have MyChart) OR . A paper copy in the mail If you have any lab test that is abnormal or we need to change your treatment, we will call you to review the results.   Testing/Procedures: None ordered   Follow-Up: At [facility], you and your health needs are our priority.  As part of our continuing mission to provide you with exceptional heart care, we have created designated Provider Care Teams.  These Care Teams include your primary Cardiologist (physician) and Advanced Practice Providers (APPs -  Physician Assistants and Nurse Practitioners) who all work together to provide you with the care you need, when you need it.  We recommend signing up for the patient portal called "MyChart".  Sign up information is provided on this After Visit Summary.  MyChart is used to connect with patients for Virtual Visits (Telemedicine).  Patients are able to view lab/test results, encounter notes, upcoming appointments, etc.  Non-urgent messages can be sent to your provider as well.   To learn more about what you can do with MyChart, go to [email or web address].    Your next appointment:   Will depend upon the lab results.

## 2019-09-04 NOTE — Progress Notes (Signed)
   09/04/19 1823  Vitals  Temp 98.3 F (36.8 C)  Temp Source Oral  BP (!) 154/59  MAP (mmHg) 87  BP Location Left Arm  BP Method Automatic  Patient Position (if appropriate) Lying  Pulse Rate 76  Resp 18  Oxygen Therapy  SpO2 98 %  O2 Device Room Air  Height and Weight  Height 5\' 4"  (1.626 m)  Weight 70.3 kg  Type of Scale Used Standing  Type of Weight Actual  BSA (Calculated - sq m) 1.78 sq meters  BMI (Calculated) 26.59  Weight in (lb) to have BMI = 25 145.3  MEWS Score  MEWS Temp 0  MEWS Systolic 0  MEWS Pulse 0  MEWS RR 0  MEWS LOC 0  MEWS Score 0  MEWS Score Color Green   Pt arrived to unit. Pt is alert and oriented to self, place and situation. [name] is at bedside.  Call bell within reach and bed alarm in place.

## 2019-09-04 NOTE — H&P (Addendum)
Cardiology Office Note   Date: 09/04/2019  ID: Adam [name], DOB 09-Jan-1931, MRN [id number]  PCP: Adam Glazier, MD  Cardiologist: Adam Carnes, MD Adam [name] at [place]  EP: Adam [name]  Chief Complaint: Hospital follow up  History of Present Illness:   Adam [name] is a 86 y.o. male with a hx of hypertension, diet-controlled diabetes mellitus, GERD, BPH, chronic kidney disease IV, Aortic stenosis, gait imbalance and recent diagnosis of afib seen for hospital follow up.  Establish care with Adam [name] in 2017 for dizziness and found to have orthostatic hypotension.  Admitted January 2020 with altered mental status and hypoxia in setting of hypertensive urgency. MRI/MRA of the brain showed no acute signs of a stroke, but did show some focal moderate proximal left M1 stenosis with moderate proximal right P2 stenosis. Neurology concern for hypertensive encephalopathy and less likely TIA. Follow-up 30-day event monitor without arrhythmia.  Admitted 08/2019 for pneumonia. Also found to have afib RVR with post conversion pause up to 4.9 sec. Seen by Adam [name] for intermittent afib. Started on Amiodarone and Eliquis. Also seen by nephrologist.  Pending final result of Zio monitor. He did had episode of afib on 5/15 at HR 103-137 (stip scanned) and Adam [name] recommended continuation of current therapy due to hx of post conversion pause.  Here today for follow up with daughter. Instead of Lasix 40 mg twice daily he has been taking 20 mg twice daily because he was unable to urinate. Since then he has progressively worsening lower extremity edema. Sleeps chronically on one pillow. Denies orthopnea or PND. Seems like congested on upper chest. Coughing white sputum. Unable to eat. Seems full belly. He is nauseated. He is resides at [facility].       Past Medical History:  Diagnosis Date  . Acute meniscal tear of knee RIGHT KNEE  . Arthritis   . BPH (benign prostatic hypertrophy)   . Colon polyps    hyperplastic  .  Diabetes mellitus type 2, diet-controlled (HCC) AND EXERCISE-- LAST A1C 5.6  . Diverticulosis   . Gait abnormality 11/08/2016  . GERD (gastroesophageal reflux disease)   . Glaucoma   . H/O hiatal hernia   . Heart murmur   . History of esophageal stricture S/P DILATION IN 2007  . Hyperlipidemia   . Hypertension   . IgM monoclonal gammopathy of uncertain significance ASYMPTOMATIC    FOLLOWED BY DR Alen [name]  . Internal hemorrhoids   . Macrocytic anemia DUE TO CHRONIC RENAL INSUFF.  . Nocturia   . Right knee pain         Past Surgical History:  Procedure Laterality Date  . CATARACT EXTRACTION W/ INTRAOCULAR LENS IMPLANT, BILATERAL  2012  . HEMORRHOID SURGERY    . HERNIA REPAIR    . KNEE ARTHROSCOPY  12/25/2011   Procedure: ARTHROSCOPY KNEE; Surgeon: Adam Bastos, MD; Location: [facility]; Service: Orthopedics; Laterality: Right; right knee arthroscopy with medial menisectomy    Current Medications:         Prior to Admission medications   Medication Sig Start Date End Date Taking? Authorizing Provider  amiodarone (PACERONE) 200 MG tablet Take 1 tablet (200 mg total) by mouth 2 (two) times daily. 08/09/19 09/08/19  Adam Phi, DO  amLODipine (NORVASC) 5 MG tablet Take 1 tablet (5 mg total) by mouth daily. 08/10/19   Adam Phi, DO  apixaban (ELIQUIS) 2.5 MG TABS tablet Take 1 tablet (2.5 mg total) by mouth 2 (two) times daily. 08/09/19  Adam Phi, DO  atorvastatin (LIPITOR) 20 MG tablet Take 1 tablet (20 mg total) by mouth daily. 05/03/18   Adam Morton, MD  betamethasone dipropionate (DIPROLENE) 0.05 % ointment Apply topically daily.  04/27/19   [provider]  brimonidine-timolol (COMBIGAN) 0.2-0.5 % ophthalmic solution Place 1 drop into the left eye 2 (two) times daily.     [provider]  carvedilol (COREG) 6.25 MG tablet Take 1 tablet (6.25 mg total) by mouth 2 (two) times daily with a meal. 08/09/19   Adam Phi, DO  clobetasol cream  (TEMOVATE) 5.36 % Apply 1 application topically every evening. 05/03/16   [provider]  finasteride (PROSCAR) 5 MG tablet Take 5 mg by mouth daily.     [provider]  furosemide (LASIX) 40 MG tablet Take 1 tablet (40 mg total) by mouth 2 (two) times daily. 08/09/19   Adam Phi, DO  hydrALAZINE (APRESOLINE) 100 MG tablet Take 1 tablet (100 mg total) by mouth every 8 (eight) hours. 08/09/19 09/08/19  Adam Phi, DO  Multiple Vitamins-Minerals (PRESERVISION AREDS PO) Take 1 tablet by mouth 2 (two) times daily.     [provider]  Netarsudil-Latanoprost (ROCKLATAN) 0.02-0.005 % SOLN Place 1 drop into the left eye at bedtime.     [provider]  omeprazole (PRILOSEC OTC) 20 MG tablet Take 20 mg by mouth daily.  05/13/19   [provider]  ULORIC 40 MG tablet Take 40 mg by mouth See admin instructions. on Tuesday, Friday, and Sunday    [provider]  Allergies: Patient has no known allergies.  Social History        Socioeconomic History  . Marital status: Widowed    Spouse name: Adam [name]  . Number of children: 2  . Years of education: 60  . Highest education level: Not on file  Occupational History  . Not on file  Tobacco Use  . Smoking status: Former Smoker    Packs/day: 1.00    Years: 10.00    Pack years: 10.00    Types: Cigarettes  . Smokeless tobacco: Never Used  Substance and Sexual Activity  . Alcohol use: Yes    Alcohol/week: 14.0 standard drinks    Types: 14 Standard drinks or equivalent per week  . Drug use: No  . Sexual activity: Not on file  Other Topics Concern  . Not on file  Social History Narrative   Lives with wife, Adam [name]   Caffeine use: Diet coke daily   Right-handed   Social Determinants of Health      Financial Resource Strain:   . Difficulty of Paying Living Expenses:   Food Insecurity:   . Worried About Charity fundraiser in the Last Year:   . Arboriculturist in the Last Year:    Transportation Needs:   . Film/video editor (Medical):   Marland Kitchen Lack of Transportation (Non-Medical):   Physical Activity:   . Days of Exercise per Week:   . Minutes of Exercise per Session:   Stress:   . Feeling of Stress :   Social Connections:   . Frequency of Communication with Friends and Family:   . Frequency of Social Gatherings with Friends and Family:   . Attends Religious Services:   . Active Member of Clubs or Organizations:   . Attends Archivist Meetings:   Marland Kitchen Marital Status:    Family History: The patient's family history includes Alcohol abuse in his sister; Cancer in  his sister; Diabetes in his brother; Early death in his father; Hearing loss in his father; Heart attack in his father; Heart disease in his father.  ROS:  Please see the history of present illness.  ROS All other systems reviewed and are negative.  PHYSICAL EXAM:   VS: BP (!) 114/48  [name] 78  Ht 5\' 4"  (1.626 m)  Wt 158 lb 12.8 oz (72 kg)  SpO2 96%  BMI 27.26 kg/m  GEN: Well nourished, well developed, in no acute distress  HEENT: normal  Neck: no JVD, carotid bruits, or masses  Cardiac: RRR; 3/6 systolic murmurs, rubs, or gallops, 2+ bilateral lower extremity edema extending up to abdomen  Respiratory: clear to auscultation bilaterally, normal work of breathing  GI: soft, nontender, distended, + BS  MS: no deformity or atrophy  Skin: warm and dry, no rash  Neuro: Alert and Oriented x 3, Strength and sensation are intact  Psych: euthymic mood, full affect     Wt Readings from Last 3 Encounters:  09/04/19 158 lb 12.8 oz (72 kg)  08/03/19 160 lb 7.9 oz (72.8 kg)  05/21/18 149 lb (67.6 kg)   Studies/Labs Reviewed:  EKG: EKG is ordered today. The ekg ordered today demonstrates *normal sinus rhythm with right bundle branch block  Recent Labs:  08/04/2019: TSH 2.985  08/05/2019: B Natriuretic Peptide 1,476.0; Magnesium 2.0  08/07/2019: ALT 21  08/09/2019: BUN 44; Creatinine, Ser 2.49;  Hemoglobin 10.2; Platelets 252; Potassium 3.0; Sodium 138  Lipid Panel  Labs (Brief)                                                    Additional studies/ records that were reviewed today include:  Echocardiogram: 08/04/19  1. Left ventricular ejection fraction, by estimation, is 60 to 65%. The  left ventricle has normal function. The left ventricle has no regional  wall motion abnormalities. Left ventricular diastolic parameters are  consistent with Grade II diastolic  dysfunction (pseudonormalization).  2. Right ventricular systolic function is normal. The right ventricular  size is normal.  3. Left atrial size was mild to moderately dilated.  4. Right atrial size was mildly dilated.  5. The mitral valve is grossly normal. Mild mitral valve regurgitation.  No evidence of mitral stenosis.  6. The aortic valve is abnormal. Aortic valve regurgitation is trivial.  Moderate aortic valve stenosis.  7. The inferior vena cava is dilated in size with >50% respiratory  variability, suggesting right atrial pressure of 8 mmHg.    ASSESSMENT & PLAN:  1. Paroxysmal atrial fibrillation with postconversion pauses Maintaining sinus rhythm. Pending result of monitor. Continue current dose of amiodarone 200 mg twice daily. He has nausea and early satiety. Questionable side effect of amiodarone versus volume overload. Pending lab. Will make adjustment based on that.  2. Acute on chronic diastolic heart failure  Patient has volume overload by exam and symptoms. Advised to go back to Lasix 40 mg twice daily as recommended by nephrologist. Check lab.  3. Chronic kidney disease stage III/IV  -Recheck renal function  -Daughter will make appointment with nephrologist  4. Moderate aortic stenosis  - Aortic valve mean gradient measures 30.8 mmHg. Aortic valve peak gradient measures 51.2 mmHg. Aortic  valve area, by VTI measures 0.75 cm.  5. Hypertension  -Initial blood pressure 114/48  -Repeat  blood pressure by myself 120/60  -Continue current dose of amlodipine at 5 mg daily, Coreg 6.25 mg twice daily, and hydralazine 100 mg 3 times daily   Plan:  Patient renal function came back at 3.43.  Which is significantly up from baseline of 2.5.  Hemoglobin 8.9 from baseline of 10.0.  Pending monitor result. Likely need to reduce dose of amiodarone. Continue hydralazine. Hold Coreg and amlodipine>> resume after vitals and findings.    Reviewed with primary cardiologist Adam [name] over phone.  Will admit for diuresis and consult nephrology for close renal follow-up.  Start IV Lasix 80 mg twice daily.  Will get proBNP in the hospital.  Adam [name] to see in hospital.

## 2019-09-05 ENCOUNTER — Encounter (HOSPITAL_COMMUNITY): Payer: Self-pay

## 2019-09-05 LAB — COMPREHENSIVE METABOLIC PANEL
ALT: 18 IU/L (ref 0–44)
AST: 17 IU/L (ref 0–40)
Albumin/Globulin Ratio: 1.7 (ref 1.2–2.2)
Albumin: 3.7 g/dL (ref 3.6–4.6)
Alkaline Phosphatase: 89 IU/L (ref 48–121)
BUN/Creatinine Ratio: 17 (ref 10–24)
BUN: 60 mg/dL — ABNORMAL HIGH (ref 8–27)
Bilirubin Total: 0.3 mg/dL (ref 0.0–1.2)
CO2: 19 mmol/L — ABNORMAL LOW (ref 20–29)
Calcium: 8.6 mg/dL (ref 8.6–10.2)
Chloride: 100 mmol/L (ref 96–106)
Creatinine, Ser: 3.43 mg/dL — ABNORMAL HIGH (ref 0.76–1.27)
GFR calc Af Amer: 18 mL/min/{1.73_m2} — ABNORMAL LOW (ref >59)
GFR calc non Af Amer: 15 mL/min/{1.73_m2} — ABNORMAL LOW (ref >59)
Globulin, Total: 2.2 g/dL (ref 1.5–4.5)
Glucose: 162 mg/dL — ABNORMAL HIGH (ref 65–99)
Potassium: 5 mmol/L (ref 3.5–5.2)
Sodium: 137 mmol/L (ref 134–144)
Total Protein: 5.9 g/dL — ABNORMAL LOW (ref 6.0–8.5)

## 2019-09-05 LAB — BASIC METABOLIC PANEL
Anion gap: 12 (ref 5–15)
BUN: 62 mg/dL — ABNORMAL HIGH (ref 8–23)
CO2: 22 mmol/L (ref 22–32)
Calcium: 8.5 mg/dL — ABNORMAL LOW (ref 8.9–10.3)
Chloride: 103 mmol/L (ref 98–111)
Creatinine, Ser: 3.7 mg/dL — ABNORMAL HIGH (ref 0.61–1.24)
GFR calc Af Amer: 16 mL/min — ABNORMAL LOW (ref >60)
GFR calc non Af Amer: 14 mL/min — ABNORMAL LOW (ref >60)
Glucose, Bld: 113 mg/dL — ABNORMAL HIGH (ref 70–99)
Potassium: 4 mmol/L (ref 3.5–5.1)
Sodium: 137 mmol/L (ref 135–145)

## 2019-09-05 LAB — GLUCOSE, CAPILLARY
Glucose-Capillary: 112 mg/dL — ABNORMAL HIGH (ref 70–99)
Glucose-Capillary: 142 mg/dL — ABNORMAL HIGH (ref 70–99)
Glucose-Capillary: 116 mg/dL — ABNORMAL HIGH (ref 70–99)
Glucose-Capillary: 113 mg/dL — ABNORMAL HIGH (ref 70–99)

## 2019-09-05 LAB — CBC
HCT: 28.2 % — ABNORMAL LOW (ref 39.0–52.0)
Hematocrit: 26.8 % — ABNORMAL LOW (ref 37.5–51.0)
Hemoglobin: 8.9 g/dL — ABNORMAL LOW (ref 13.0–17.7)
Hemoglobin: 9.1 g/dL — ABNORMAL LOW (ref 13.0–17.0)
MCH: 30.2 pg (ref 26.6–33.0)
MCH: 29.4 pg (ref 26.0–34.0)
MCHC: 33.2 g/dL (ref 31.5–35.7)
MCHC: 32.3 g/dL (ref 30.0–36.0)
MCV: 91 fL (ref 79–97)
MCV: 91.3 fL (ref 80.0–100.0)
Platelets: 231 10*3/uL (ref 150–450)
Platelets: 219 10*3/uL (ref 150–400)
RBC: 2.95 x10E6/uL — ABNORMAL LOW (ref 4.14–5.80)
RBC: 3.09 MIL/uL — ABNORMAL LOW (ref 4.22–5.81)
RDW: 13.6 % (ref 11.6–15.4)
RDW: 14.6 % (ref 11.5–15.5)
WBC: 8.4 10*3/uL (ref 3.4–10.8)
WBC: 6.5 10*3/uL (ref 4.0–10.5)
nRBC: 0 % (ref 0.0–0.2)

## 2019-09-05 LAB — BRAIN NATRIURETIC PEPTIDE: B Natriuretic Peptide: 291.4 pg/mL — ABNORMAL HIGH (ref 0.0–100.0)

## 2019-09-05 LAB — PRO B NATRIURETIC PEPTIDE: NT-Pro BNP: 3337 pg/mL — ABNORMAL HIGH (ref 0–486)

## 2019-09-05 MED ORDER — AMIODARONE HCL 100 MG PO TABS
100.0000 mg | ORAL_TABLET | Freq: Every day | ORAL | Status: DC
  Administered 2019-09-05 – 2019-09-08 (×4): 100 mg via ORAL
  Filled 2019-09-05 (×4): qty 1

## 2019-09-05 MED ORDER — FUROSEMIDE 10 MG/ML IJ SOLN
80.0000 mg | Freq: Every day | INTRAMUSCULAR | Status: DC
  Administered 2019-09-06: 80 mg via INTRAVENOUS
  Filled 2019-09-05: qty 8

## 2019-09-05 NOTE — Plan of Care (Signed)

## 2019-09-05 NOTE — Progress Notes (Signed)
Progress Note  Patient Name: Adam [name] Date of Encounter: 09/05/2019  Primary Cardiologist: Dorris Carnes, MD   Subjective   C/o nausea and early satiety  Inpatient Medications    Scheduled Meds: . amiodarone  200 mg Oral BID  . apixaban  2.5 mg Oral BID  . atorvastatin  20 mg Oral Daily  . brimonidine-timolol  1 drop Both Eyes Q12H  . finasteride  5 mg Oral Daily  . furosemide  80 mg Intravenous BID  . hydrALAZINE  100 mg Oral Q8H  . Netarsudil-Latanoprost  1 drop Left Eye QHS  . pantoprazole  40 mg Oral Daily   Continuous Infusions:  PRN Meds: acetaminophen, nitroGLYCERIN, ondansetron (ZOFRAN) IV   Vital Signs    Vitals:   09/05/19 0122 09/05/19 0430 09/05/19 0611 09/05/19 0748  BP: (!) 121/51 (!) 128/48 (!) 135/55 (!) 144/59  Pulse: 78 77 75 81  Resp: 19 19  18   Temp: 98.3 F (36.8 C) 98.1 F (36.7 C)  98.2 F (36.8 C)  TempSrc: Oral Oral  Oral  SpO2: 94% 92%  93%  Weight:  69.3 kg    Height:        Intake/Output Summary (Last 24 hours) at 09/05/2019 0948 Last data filed at 09/05/2019 0122 Gross per 24 hour  Intake --  Output 700 ml  Net -700 ml   Filed Weights   09/04/19 1823 09/05/19 0430  Weight: 70.3 kg 69.3 kg    Telemetry    nsr - Personally Reviewed  ECG    nsr - Personally Reviewed  Physical Exam   GEN: frail appearing, acute distress.   Neck: 6 cm JVD Cardiac: RRR, 2/6 systolic murmurs, rubs, or gallops.  Respiratory: Clear to auscultation bilaterally. GI: Soft, nontender, non-distended  MS: No edema; No deformity. Neuro:  Nonfocal  Psych: Normal affect   Labs    Chemistry Recent Labs  Lab 09/04/19 1103 09/05/19 0545  NA 137 137  K 5.0 4.0  CL 100 103  CO2 19* 22  GLUCOSE 162* 113*  BUN 60* 62*  CREATININE 3.43* 3.70*  CALCIUM 8.6 8.5*  PROT 5.9*  --   ALBUMIN 3.7  --   AST 17  --   ALT 18  --   ALKPHOS 89  --   BILITOT 0.3  --   GFRNONAA 15* 14*  GFRAA 18* 16*  ANIONGAP  --  12     Hematology Recent Labs   Lab 09/04/19 1103 09/05/19 0545  WBC 8.4 6.5  RBC 2.95* 3.09*  HGB 8.9* 9.1*  HCT 26.8* 28.2*  MCV 91 91.3  MCH 30.2 29.4  MCHC 33.2 32.3  RDW 13.6 14.6  PLT 231 219    Cardiac EnzymesNo results for input(s): TROPONINI in the last 168 hours. No results for input(s): TROPIPOC in the last 168 hours.   BNP Recent Labs  Lab 09/04/19 1103 09/05/19 0545  BNP  --  291.4*  PROBNP 3,337*  --      DDimer No results for input(s): DDIMER in the last 168 hours.   Radiology    No results found.  Cardiac Studies   none  Patient Profile     86 y.o. male admitted with acute on chronic diastolic heart failure, non-compliance, and PAF on amiodarone.  Assessment & Plan    1. Acute on chronic diastolic heart failure - he has been given IV lasix. I will reduce dose pending Renal service rec's. 2. PAF - he is maintaining NSR. I  will reduce his dose of amiodarone. 3. Nausea/early satiety - this could be due to gut edema but could also be related to amiodarone. As he is maintaining NSR, we will reduce his dose of amio. 4. AS - he has a mean gradient of 30. Not surgical at this time.  Adam [name],M.D.  For questions or updates, please contact Sisters Please consult [email or web address] for contact info under Cardiology/STEMI.      Signed, Adam Peru, MD  09/05/2019, 9:48 AM  Patient ID: Adam [name], male   DOB: 1930-10-11, 86 y.o.   MRN: [id number]

## 2019-09-06 ENCOUNTER — Inpatient Hospital Stay (HOSPITAL_COMMUNITY): Payer: Medicare Other

## 2019-09-06 LAB — BASIC METABOLIC PANEL
Anion gap: 15 (ref 5–15)
BUN: 57 mg/dL — ABNORMAL HIGH (ref 8–23)
CO2: 21 mmol/L — ABNORMAL LOW (ref 22–32)
Calcium: 8.7 mg/dL — ABNORMAL LOW (ref 8.9–10.3)
Chloride: 102 mmol/L (ref 98–111)
Creatinine, Ser: 3.53 mg/dL — ABNORMAL HIGH (ref 0.61–1.24)
GFR calc Af Amer: 17 mL/min — ABNORMAL LOW (ref >60)
GFR calc non Af Amer: 15 mL/min — ABNORMAL LOW (ref >60)
Glucose, Bld: 192 mg/dL — ABNORMAL HIGH (ref 70–99)
Potassium: 4.4 mmol/L (ref 3.5–5.1)
Sodium: 138 mmol/L (ref 135–145)

## 2019-09-06 LAB — GLUCOSE, CAPILLARY
Glucose-Capillary: 115 mg/dL — ABNORMAL HIGH (ref 70–99)
Glucose-Capillary: 116 mg/dL — ABNORMAL HIGH (ref 70–99)
Glucose-Capillary: 130 mg/dL — ABNORMAL HIGH (ref 70–99)
Glucose-Capillary: 121 mg/dL — ABNORMAL HIGH (ref 70–99)

## 2019-09-06 MED ORDER — FUROSEMIDE 10 MG/ML IJ SOLN
80.0000 mg | Freq: Two times a day (BID) | INTRAMUSCULAR | Status: DC
  Administered 2019-09-06: 80 mg via INTRAVENOUS
  Filled 2019-09-06 (×2): qty 8

## 2019-09-06 NOTE — Consult Note (Signed)
Renal Service Consult Note [facility]  Adam [name] 09/06/2019 [name] Requesting Physician: Dr [name], [name].   Reason for Consult:  Renal failure HPI: The patient is a 86 y.o. year-old w/ hx of HTN x 20 yrs, HL, BPH, aortic stenosis and CKD IV was recently diagnosed w/ atrial fib during hospital stay in May 2021. Pt was seen in f/u in office on 6/4 and labs returned showing creat up to 3.4 from baseline 2.5.  Also Hb down to 8.9.  Pt also c/o progressive LE edema and the concern was for heart failure. So he was sent to hospital for admission. Pt was started on IV lasix 80 bid, has had good UOP and is feeling about "2/3" better today after 36 hrs diuresis. We are asked to see for CKD.    Pt denies any nsaid abuse, voiding difficulties, does have BPH. No change in urine color, dysuria.  No orthopnea today.  +LE edema as above.  Was taking lasix 1-2 yrs at low dose 10-3m but recently has been increased.  Has had HTN for about 20 yrs.    Last echo showed moderate Ao stenosis, normal appearing LV otherwise.    ROS  denies CP  no joint pain   no HA  no blurry vision  no rash  no diarrhea  no nausea/ vomiting  no dysuria  no difficulty voiding  no change in urine color    Past Medical History  Past Medical History:  Diagnosis Date  . Acute meniscal tear of knee RIGHT KNEE  . Arthritis   . BPH (benign prostatic hypertrophy)   . Colon polyps    hyperplastic  . Diabetes mellitus type 2, diet-controlled (HCC) AND EXERCISE-- LAST A1C  5.6  . Diverticulosis   . Gait abnormality 11/08/2016  . GERD (gastroesophageal reflux disease)   . Glaucoma   . H/O hiatal hernia   . Heart murmur   . History of esophageal stricture S/P DILATION IN 2007  . Hyperlipidemia   . Hypertension   . IgM monoclonal gammopathy of uncertain significance ASYMPTOMATIC    FOLLOWED BY DR SAlen Blew . Internal hemorrhoids   . Macrocytic anemia DUE TO CHRONIC RENAL INSUFF.  . Nocturia   . Right  knee pain    Past Surgical History  Past Surgical History:  Procedure Laterality Date  . CATARACT EXTRACTION W/ INTRAOCULAR LENS  IMPLANT, BILATERAL  2012  . HEMORRHOID SURGERY    . HERNIA REPAIR    . KNEE ARTHROSCOPY  12/25/2011   Procedure: ARTHROSCOPY KNEE;  Surgeon: RTobi Bastos MD;  [facility]  Service: Orthopedics;  Laterality: Right;  right knee arthroscopy with medial menisectomy     Family History  Family History  Problem Relation Age of Onset  . Diabetes Brother   . Heart attack Father   . Early death Father   . Hearing loss Father   . Heart disease Father   . Alcohol abuse Sister   . Cancer Sister    Social History  reports that he has quit smoking. His smoking use included cigarettes. He has a 10.00 pack-year smoking history. He has never used smokeless tobacco. He reports current alcohol use of about 14.0 standard drinks of alcohol per week. He reports that he does not use drugs. Allergies No Known Allergies Home medications Prior to Admission medications   Medication Sig Start Date End Date Taking? Authorizing Provider  acetaminophen (TYLENOL) 500 MG tablet Take 500 mg by  mouth daily as needed for headache (pain).   Yes [provider]  amiodarone (PACERONE) 200 MG tablet Take 1 tablet (200 mg total) by mouth 2 (two) times daily. 08/09/19 09/08/19 Yes Dessa Phi, DO  amLODipine (NORVASC) 5 MG tablet Take 1 tablet (5 mg total) by mouth daily. 08/10/19  Yes Dessa Phi, DO  apixaban (ELIQUIS) 2.5 MG TABS tablet Take 1 tablet (2.5 mg total) by mouth 2 (two) times daily. 08/09/19  Yes Dessa Phi, DO  atorvastatin (LIPITOR) 20 MG tablet Take 1 tablet (20 mg total) by mouth daily. 05/03/18  Yes Fuller Plan A, MD  betamethasone dipropionate (DIPROLENE) 0.05 % ointment Apply 1 application topically daily as needed (itching/rash).  04/27/19  Yes [provider]  Bioflavonoid Products (ESTER C PO) Take 1 tablet by mouth 2  (two) times daily.   Yes [provider]  brimonidine-timolol (COMBIGAN) 0.2-0.5 % ophthalmic solution Place 1 drop into the left eye 2 (two) times daily.    Yes [provider]  carvedilol (COREG) 6.25 MG tablet Take 1 tablet (6.25 mg total) by mouth 2 (two) times daily with a meal. 08/09/19  Yes Dessa Phi, DO  Cholecalciferol (VITAMIN D-3) 125 MCG (5000 UT) TABS Take 5,000 Units by mouth daily.   Yes [provider]  clobetasol cream (TEMOVATE) 4.49 % Apply 1 application topically daily as needed (itching/rash).  05/03/16  Yes [provider]  febuxostat (ULORIC) 40 MG tablet Take 40 mg by mouth See admin instructions. Take one tablet (40 mg) by mouth Tuesday, Friday, Sunday evening   Yes [provider]  finasteride (PROSCAR) 5 MG tablet Take 5 mg by mouth daily.    Yes [provider]  furosemide (LASIX) 40 MG tablet Take 1 tablet (40 mg total) by mouth 2 (two) times daily. Patient taking differently: Take 20 mg by mouth 2 (two) times daily.  08/09/19  Yes Dessa Phi, DO  hydrALAZINE (APRESOLINE) 100 MG tablet Take 1 tablet (100 mg total) by mouth every 8 (eight) hours. 08/09/19 09/08/19 Yes Dessa Phi, DO  Multiple Vitamins-Minerals (PRESERVISION AREDS PO) Take 1 capsule by mouth 2 (two) times daily.    Yes [provider]  Netarsudil-Latanoprost (ROCKLATAN) 0.02-0.005 % SOLN Place 1 drop into the left eye at bedtime.    Yes [provider]  omeprazole (PRILOSEC OTC) 20 MG tablet Take 20 mg by mouth daily with lunch.  05/13/19  Yes [provider]  SODIUM BICARBONATE PO Take 1 capsule by mouth in the morning and at bedtime.   Yes [provider]     Vitals:   09/05/19 1631 09/05/19 1955 09/06/19 0406 09/06/19 1146  BP: (!) 144/58 (!) 129/57 (!) 142/56 (!) 124/55  Pulse: 79 79 80 79  Resp: _0 Temp: 97.8 F (36.6 C) 98.4 F (36.9 C) 97.7 F (36.5 C) 98.7 F (37.1 C)  TempSrc: Oral Oral  Oral Oral  SpO2: 95% 91% 93% 90%  Weight:   67.8 kg   Height:       Exam Gen alert, no distress, clearing throat frequently (dtr says this is old) No rash, cyanosis or gangrene Sclera anicteric, throat clear No jvd or bruits Chest R base clear, L base fine rales RRR no RG, loud 2-3/6 SEM radiating to R > L neck Abd soft ntnd no mass or ascites +bs GU normal male MS no joint effusions or deformity Ext mild-mod bilat LE edema, no wounds or ulcers Neuro is alert, Ox  3 , nf   Date   Creat  eGFR  2013   1.6- 1.9  2017   1.64  2018   2.02  2020   1.69- 2.12 28- 36 ml/min  may 2-9, 2021 2.44- 2.60 21- 23 ml/min  jun 4-6, 2021  3.43- 3.70 14-15 ml/min    Home meds:  - eliquis 2.5 bid/ lipitor 20/ amiodarone 200 bid  - norvasc 5 / coreg 6.25 bid/ lasix 20 bid/ hydralazine 100 tid  - prilosec 20 qd/ proscar 5 qd/ uloric 37m tiw/ sod bicarb bid  - prn's/ vitamins/ supplements/ eyedrops   UA 08/04/19 (last admit)- negative, 100 prot , 0-5 rbc/ wbc  Renal [place]5/4/21 ( last admit) - 9.9/8.5 cm kidneys/ moderate bilat atrophy and cortical thinning, ^'d echotexture, no hydro, bladder appears normal   CXR today 6/6 - bilat vasc congestion, mod layering L effusion    ECHO 08/04/19 - LVEF 60-65%, mod aortic stenosis, normal RV      Assessment/ Plan: 1. AoCKD 4 - pt w/ progression kidney failure over the last 8 yrs. Renal atrophy on [place]uggesting long-term damage. More recent subacute decline may be cardiac related w/ decomp CHF and recent onset atrial fib.  Also hx aortic stenosis but is not severe. Recent UA negative, doubt GN. Repeat [place] be sure no obstructive component. Otherwise treatment is diuresis as you're doing. Will resume lasix 80 IV bid for today. CXR showing some residual CHF but pt overall feeling much better since admission. Pt clearly states he does not want dialysis and we talked about how hospice works a bit and what EOL from ESRD is like.  He is DNR. Will follow.  2. HTN - cont  meds 3. Atrial fibrillation - recent onset 4. A/C chron diast CHF - as above 5. Moderate aortic stenosis 6. BPH      Rob Meghann Landing  MD 09/06/2019, 3:57 PM  Recent Labs  Lab 09/04/19 1103 09/05/19 0545  WBC 8.4 6.5  HGB 8.9* 9.1*   Recent Labs  Lab 09/05/19 0545 09/06/19 0821  K 4.0 4.4  BUN 62* 57*  CREATININE 3.70* 3.53*  CALCIUM 8.5* 8.7*

## 2019-09-06 NOTE — Progress Notes (Signed)
Progress Note  Patient Name: Sacha Radloff Date of Encounter: 09/06/2019  Primary Cardiologist: Dorris Carnes, MD   Subjective   "I feel better. "  Inpatient Medications    Scheduled Meds: . amiodarone  100 mg Oral Daily  . apixaban  2.5 mg Oral BID  . atorvastatin  20 mg Oral Daily  . brimonidine-timolol  1 drop Both Eyes Q12H  . finasteride  5 mg Oral Daily  . furosemide  80 mg Intravenous Daily  . hydrALAZINE  100 mg Oral Q8H  . Netarsudil-Latanoprost  1 drop Left Eye QHS  . pantoprazole  40 mg Oral Daily   Continuous Infusions:  PRN Meds: acetaminophen, nitroGLYCERIN, ondansetron (ZOFRAN) IV   Vital Signs    Vitals:   09/05/19 1129 09/05/19 1631 09/05/19 1955 09/06/19 0406  BP: 128/61 (!) 144/58 (!) 129/57 (!) 142/56  Pulse: 76 79 79 80  Resp: 18 20 19 19   Temp: 98.3 F (36.8 C) 97.8 F (36.6 C) 98.4 F (36.9 C) 97.7 F (36.5 C)  TempSrc: Oral Oral Oral Oral  SpO2: 94% 95% 91% 93%  Weight:    67.8 kg  Height:        Intake/Output Summary (Last 24 hours) at 09/06/2019 0925 Last data filed at 09/06/2019 0406 Gross per 24 hour  Intake 600 ml  Output 1300 ml  Net -700 ml   Filed Weights   09/04/19 1823 09/05/19 0430 09/06/19 0406  Weight: 70.3 kg 69.3 kg 67.8 kg    Telemetry    NSR - Personally Reviewed  ECG    none - Personally Reviewed  Physical Exam   GEN: No acute distress.   Neck: No JVD Cardiac: RRR, no murmurs, rubs, or gallops.  Respiratory: Clear to auscultation bilaterally. GI: Soft, nontender, non-distended  MS: No edema; No deformity. Neuro:  Nonfocal  Psych: Normal affect   Labs    Chemistry Recent Labs  Lab 09/04/19 1103 09/05/19 0545 09/06/19 0821  NA 137 137 138  K 5.0 4.0 4.4  CL 100 103 102  CO2 19* 22 21*  GLUCOSE 162* 113* 192*  BUN 60* 62* 57*  CREATININE 3.43* 3.70* 3.53*  CALCIUM 8.6 8.5* 8.7*  PROT 5.9*  --   --   ALBUMIN 3.7  --   --   AST 17  --   --   ALT 18  --   --   ALKPHOS 89  --   --   BILITOT  0.3  --   --   GFRNONAA 15* 14* 15*  GFRAA 18* 16* 17*  ANIONGAP  --  12 15     Hematology Recent Labs  Lab 09/04/19 1103 09/05/19 0545  WBC 8.4 6.5  RBC 2.95* 3.09*  HGB 8.9* 9.1*  HCT 26.8* 28.2*  MCV 91 91.3  MCH 30.2 29.4  MCHC 33.2 32.3  RDW 13.6 14.6  PLT 231 219    Cardiac EnzymesNo results for input(s): TROPONINI in the last 168 hours. No results for input(s): TROPIPOC in the last 168 hours.   BNP Recent Labs  Lab 09/04/19 1103 09/05/19 0545  BNP  --  291.4*  PROBNP 3,337*  --      DDimer No results for input(s): DDIMER in the last 168 hours.   Radiology    No results found.  Cardiac Studies   none  Patient Profile     86 y.o. male admitted with acute on chronic diastolic heart failure and volume overload and worsening renal dysfunction  Assessment & Plan    1. Acute on chronic diastolic heart failure - his weight is down a bit. He feels less dyspneic. We will continue gentle diuresis 2. Stage 4 renal failure - his creatinine is a bit better today. Await renal service input.  3. AS - moderate but not yet surgical. He may not be a candidate. 4. HTN - his bp is improved.   For questions or updates, please contact [place] Please consult [email or web address] for contact info under Cardiology/STEMI.   Signed, [name], MD  09/06/2019, 9:25 AM  Patient ID: [name], male   DOB: 1930-08-15, 86 y.o.   MRN: [id number]

## 2019-09-07 DIAGNOSIS — I350 Nonrheumatic aortic (valve) stenosis: Secondary | ICD-10-CM

## 2019-09-07 DIAGNOSIS — N184 Chronic kidney disease, stage 4 (severe): Secondary | ICD-10-CM

## 2019-09-07 DIAGNOSIS — I10 Essential (primary) hypertension: Secondary | ICD-10-CM

## 2019-09-07 LAB — IRON AND TIBC
Iron: 16 ug/dL — ABNORMAL LOW (ref 45–182)
Saturation Ratios: 8 % — ABNORMAL LOW (ref 17.9–39.5)
TIBC: 199 ug/dL — ABNORMAL LOW (ref 250–450)
UIBC: 183 ug/dL

## 2019-09-07 LAB — GLUCOSE, CAPILLARY
Glucose-Capillary: 106 mg/dL — ABNORMAL HIGH (ref 70–99)
Glucose-Capillary: 108 mg/dL — ABNORMAL HIGH (ref 70–99)
Glucose-Capillary: 118 mg/dL — ABNORMAL HIGH (ref 70–99)
Glucose-Capillary: 121 mg/dL — ABNORMAL HIGH (ref 70–99)
Glucose-Capillary: 115 mg/dL — ABNORMAL HIGH (ref 70–99)

## 2019-09-07 LAB — BASIC METABOLIC PANEL
Anion gap: 13 (ref 5–15)
BUN: 57 mg/dL — ABNORMAL HIGH (ref 8–23)
CO2: 20 mmol/L — ABNORMAL LOW (ref 22–32)
Calcium: 8.3 mg/dL — ABNORMAL LOW (ref 8.9–10.3)
Chloride: 103 mmol/L (ref 98–111)
Creatinine, Ser: 3.72 mg/dL — ABNORMAL HIGH (ref 0.61–1.24)
GFR calc Af Amer: 16 mL/min — ABNORMAL LOW (ref >60)
GFR calc non Af Amer: 14 mL/min — ABNORMAL LOW (ref >60)
Glucose, Bld: 121 mg/dL — ABNORMAL HIGH (ref 70–99)
Potassium: 4 mmol/L (ref 3.5–5.1)
Sodium: 136 mmol/L (ref 135–145)

## 2019-09-07 LAB — FERRITIN: Ferritin: 393 ng/mL — ABNORMAL HIGH (ref 24–336)

## 2019-09-07 MED ORDER — SODIUM CHLORIDE 0.9 % IV SOLN
510.0000 mg | INTRAVENOUS | Status: DC
  Administered 2019-09-07: 510 mg via INTRAVENOUS
  Filled 2019-09-07: qty 17

## 2019-09-07 MED ORDER — FUROSEMIDE 80 MG PO TABS
80.0000 mg | ORAL_TABLET | Freq: Two times a day (BID) | ORAL | Status: DC
  Administered 2019-09-07 – 2019-09-08 (×3): 80 mg via ORAL
  Filled 2019-09-07 (×3): qty 1

## 2019-09-07 NOTE — Care Management Important Message (Signed)
Important Message  Patient Details  Name: Adam [name] MRN: [id number] Date of Birth: 14-Apr-1930   Medicare Important Message Given:  Yes     Shelda Altes 09/07/2019, 3:14 PM

## 2019-09-07 NOTE — Progress Notes (Signed)
Patient ID: Adam [name], male   DOB: 1930-06-07, 86 y.o.   MRN: [id number] [facility] Progress Note   Assessment/ Plan:   1. Acute kidney Injury on chronic kidney disease stage IV: Renal function slightly worse versus essentially unchanged overnight with creatinine of 3.7 this morning.  He does not have any acute electrolyte abnormalities or uremic signs or symptoms to prompt a change in management.  Given the clinical improvement of his volume status (although not corroborated by weight); will convert him to oral furosemide today.  He has made the decision not to pursue any forms of renal replacement therapy and can be followed up by the heart failure clinic for ongoing diuretic management. 2.  Acute exacerbation of chronic diastolic heart failure: Secondary to a lapse in compliance/dosing of diuretic therapy.  Clinically better overnight and per his daughter/patient himself back to baseline.  I will convert him to oral furosemide 80 mg twice daily today with the aim to transition to his oral outpatient dose tomorrow (previously taking 40 mg twice daily). 3.  Atrial fibrillation: Appears to be rate controlled and currently in sinus rhythm.  On apixaban. 4.  Hypertension: Blood pressure under acceptable control, monitor with ongoing diuresis. 5.  Anemia: Without overt loss and likely from chronic illness, will check iron studies with labs today.  Subjective:   Reports to be feeling better, back to baseline.   Objective:   BP (!) 135/53 (BP Location: Right Arm)   Pulse 76   Temp (!) 97.4 F (36.3 C) (Oral)   Resp 20   Ht 5\' 4"  (1.626 m)   Wt 67.9 kg Comment: scale b  SpO2 94%   BMI 25.71 kg/m   Intake/Output Summary (Last 24 hours) at 09/07/2019 0842 Last data filed at 09/07/2019 0600 Gross per 24 hour  Intake 930 ml  Output 1650 ml  Net -720 ml   Weight change: 0.136 kg  Physical Exam: Gen: Comfortably resting flat in bed, daughter at bedside. CVS: Pulse regular rhythm,  normal rate, S1 and S2 Resp: Clear to auscultation, no rales/rhonchi Abd: Soft, flat, nontender Ext: Trace right ankle edema, left ankle no edema.  Imaging: DG Chest 2 View  Result Date: 09/06/2019 CLINICAL DATA:  Cough. EXAM: CHEST - 2 VIEW COMPARISON:  None. FINDINGS: Mild, hazy left basilar atelectasis and/or infiltrate is seen. There is a small left pleural effusion. No pneumothorax is identified. The cardiac silhouette is mildly enlarged and unchanged in size. The visualized skeletal structures are unremarkable. IMPRESSION: 1. Mild, hazy left basilar atelectasis and/or infiltrate. 2. Small left pleural effusion. Electronically Signed   By: Virgina Norfolk M.D.   On: 09/06/2019 16:58   US RENAL  Result Date: 09/06/2019 CLINICAL DATA:  Stage IV chronic renal insufficiency EXAM: RENAL / URINARY TRACT ULTRASOUND COMPLETE COMPARISON:  08/04/2019 FINDINGS: Right Kidney: Renal measurements: 8.1 x 4.8 by 4.1 cm = volume: 82 mL. Increased renal cortical echotexture consistent with medical renal disease. 2.1 cm simple right renal cyst. Left Kidney: Renal measurements: 9.1 x 4.7 x 4.9 cm = volume: 109 mL. Increased renal cortical echotexture. No hydronephrosis or renal mass. Bladder: Appears normal for degree of bladder distention. Other: None. IMPRESSION: 1. Stable increased renal cortical echotexture consistent with medical renal disease. Electronically Signed   By: Randa Ngo M.D.   On: 09/06/2019 19:27    Labs: BMET Recent Labs  Lab 09/04/19 1103 09/05/19 0545 09/06/19 0821 09/07/19 0005  NA 137 137 138 136  K 5.0 4.0 4.4  4.0  CL 100 103 102 103  CO2 19* 22 21* 20*  GLUCOSE 162* 113* 192* 121*  BUN 60* 62* 57* 57*  CREATININE 3.43* 3.70* 3.53* 3.72*  CALCIUM 8.6 8.5* 8.7* 8.3*   CBC Recent Labs  Lab 09/04/19 1103 09/05/19 0545  WBC 8.4 6.5  HGB 8.9* 9.1*  HCT 26.8* 28.2*  MCV 91 91.3  PLT 231 219   Medications:    . amiodarone  100 mg Oral Daily  . apixaban  2.5 mg  Oral BID  . atorvastatin  20 mg Oral Daily  . brimonidine-timolol  1 drop Both Eyes Q12H  . finasteride  5 mg Oral Daily  . furosemide  80 mg Intravenous Q12H  . hydrALAZINE  100 mg Oral Q8H  . Netarsudil-Latanoprost  1 drop Left Eye QHS  . pantoprazole  40 mg Oral Daily   Elmarie Shiley, MD 09/07/2019, 8:42 AM

## 2019-09-07 NOTE — Progress Notes (Signed)
Progress Note  Patient Name: Adam [name] Date of Encounter: 09/07/2019  Primary Cardiologist: Dorris Carnes, MD   Subjective   Breatinng is OK   Deneis CP    Inpatient Medications    Scheduled Meds: . amiodarone  100 mg Oral Daily  . apixaban  2.5 mg Oral BID  . atorvastatin  20 mg Oral Daily  . brimonidine-timolol  1 drop Both Eyes Q12H  . finasteride  5 mg Oral Daily  . furosemide  80 mg Intravenous Q12H  . hydrALAZINE  100 mg Oral Q8H  . Netarsudil-Latanoprost  1 drop Left Eye QHS  . pantoprazole  40 mg Oral Daily   Continuous Infusions:  PRN Meds: acetaminophen, nitroGLYCERIN, ondansetron (ZOFRAN) IV   Vital Signs    Vitals:   09/06/19 1146 09/06/19 1606 09/06/19 2025 09/07/19 0512  BP: (!) 124/55 (!) 149/74 (!) 147/60 (!) 135/53  Pulse: 79  82 76  Resp: 18  18 20   Temp: 98.7 F (37.1 C)  98.3 F (36.8 C) (!) 97.4 F (36.3 C)  TempSrc: Oral  Oral Oral  SpO2: 90%  98% 94%  Weight:    67.9 kg  Height:        Intake/Output Summary (Last 24 hours) at 09/07/2019 0707 Last data filed at 09/07/2019 0600 Gross per 24 hour  Intake 1150 ml  Output 2050 ml  Net -900 ml   Filed Weights   09/05/19 0430 09/06/19 0406 09/07/19 0512  Weight: 69.3 kg 67.8 kg 67.9 kg    Telemetry    SR   - Personally Reviewed  ECG    none - Personally Reviewed  Physical Exam   GEN: No acute distress.   Neck: No JVD Cardiac: RRR,Gr III/VI systolic murmur base   Respiratory: Clear to auscultation bilaterally. GI: Soft, nontender, non-distended  MS: No edema; No deformity. Neuro:  Nonfocal  Psych: Normal affect   Labs    Chemistry Recent Labs  Lab 09/04/19 1103 09/04/19 1103 09/05/19 0545 09/06/19 0821 09/07/19 0005  NA 137  --  137 138 136  K 5.0   < > 4.0 4.4 4.0  CL 100   < > 103 102 103  CO2 19*   < > 22 21* 20*  GLUCOSE 162*  --  113* 192* 121*  BUN 60*  --  62* 57* 57*  CREATININE 3.43*   < > 3.70* 3.53* 3.72*  CALCIUM 8.6   < > 8.5* 8.7* 8.3*  PROT 5.9*   --   --   --   --   ALBUMIN 3.7  --   --   --   --   AST 17  --   --   --   --   ALT 18  --   --   --   --   ALKPHOS 89  --   --   --   --   BILITOT 0.3  --   --   --   --   GFRNONAA 15*   < > 14* 15* 14*  GFRAA 18*   < > 16* 17* 16*  ANIONGAP  --   --  12 15 13    < > = values in this interval not displayed.     Hematology Recent Labs  Lab 09/04/19 1103 09/05/19 0545  WBC 8.4 6.5  RBC 2.95* 3.09*  HGB 8.9* 9.1*  HCT 26.8* 28.2*  MCV 91 91.3  MCH 30.2 29.4  MCHC 33.2 32.3  RDW 13.6  14.6  PLT 231 219    Cardiac EnzymesNo results for input(s): TROPONINI in the last 168 hours. No results for input(s): TROPIPOC in the last 168 hours.   BNP Recent Labs  Lab 09/04/19 1103 09/05/19 0545  BNP  --  291.4*  PROBNP 3,337*  --      DDimer No results for input(s): DDIMER in the last 168 hours.   Radiology    DG Chest 2 View  Result Date: 09/06/2019 CLINICAL DATA:  Cough. EXAM: CHEST - 2 VIEW COMPARISON:  None. FINDINGS: Mild, hazy left basilar atelectasis and/or infiltrate is seen. There is a small left pleural effusion. No pneumothorax is identified. The cardiac silhouette is mildly enlarged and unchanged in size. The visualized skeletal structures are unremarkable. IMPRESSION: 1. Mild, hazy left basilar atelectasis and/or infiltrate. 2. Small left pleural effusion. Electronically Signed   By: Virgina Norfolk M.D.   On: 09/06/2019 16:58   US RENAL  Result Date: 09/06/2019 CLINICAL DATA:  Stage IV chronic renal insufficiency EXAM: RENAL / URINARY TRACT ULTRASOUND COMPLETE COMPARISON:  08/04/2019 FINDINGS: Right Kidney: Renal measurements: 8.1 x 4.8 by 4.1 cm = volume: 82 mL. Increased renal cortical echotexture consistent with medical renal disease. 2.1 cm simple right renal cyst. Left Kidney: Renal measurements: 9.1 x 4.7 x 4.9 cm = volume: 109 mL. Increased renal cortical echotexture. No hydronephrosis or renal mass. Bladder: Appears normal for degree of bladder distention.  Other: None. IMPRESSION: 1. Stable increased renal cortical echotexture consistent with medical renal disease. Electronically Signed   By: Randa Ngo M.D.   On: 09/06/2019 19:27    Cardiac Studies   none  Patient Profile     86 y.o. male admitted with acute on chronic diastolic heart failure and volume overload and worsening renal dysfunction  Assessment & Plan    1. Acute on chronic diastolic heart failure - Pt has diuresed some though wts do not relfect   Renal following   Cr is 3.72 today  Renal suggests switchin to oral  Furosemide Daughter checking with [facility] about diet (low NA diet)  I2. Stage 4 renal failure - As noted above   Pt does not want to consider dialysis  3. AS - moderate by echo back in May 2o21 with mean gradient of 30 mm Hg   Given renal function he prob is not a candidate for intervention in future   Watch salt   4. HTN - BP is OK on current regimen   5  Hx PAF   Remains in SR   Note amio was lowered to 100 qd  He deneis nausea now (? Due to amio vs fluid)  6  HL  Keep on lipitor   .   For questions or updates, please contact [place] Please consult [email or web address] for contact info under Cardiology/STEMI.   Signed, Dorris Carnes, MD  09/07/2019, 7:07 AM  Patient ID: Adam [name], male   DOB: Aug 03, 1930, 86 y.o.   MRN: [id number]

## 2019-09-07 NOTE — TOC Initial Note (Signed)
Transition of Care [facility]) - Initial/Assessment Note    Patient Details  Name: Adam [name] MRN: [id number] Date of Birth: 15-Apr-1930  Transition of Care [facility]) CM/SW Contact:    Zenon Mayo, RN Phone Number: 09/07/2019, 1:49 PM  Clinical Narrative:                 Patient is form [facility] IDL, he is active with [facility] for [facility], [place].  Notified Cory with [employer].   Expected Discharge Plan: [place] Barriers to Discharge: Continued Medical Work up   Patient Goals and CMS Choice        Expected Discharge Plan and Services Expected Discharge Plan: [place]   Discharge Planning Services: CM Consult Post Acute Care Choice: Resumption of Svcs/PTA Provider Living arrangements for the past 2 months: [place]                   DME Agency: NA       HH Arranged: RN, PT [place] Agency: [place] Date [facility] Agency Contacted: 09/07/19 Time HH Agency Contacted: 31 Representative spoke with at [place]: Tommi Rumps  Prior Living Arrangements/Services Living arrangements for the past 2 months: Materials engineer Lives with:: Self Patient language and need for interpreter reviewed:: Yes Do you feel safe going back to the place where you live?: Yes      Need for Family Participation in Patient Care: Yes (Comment) Care giver support system in place?: Yes (comment) Current home services: Home PT, Home RN([facility]) Criminal Activity/Legal Involvement Pertinent to Current Situation/Hospitalization: No - Comment as needed  Activities of Daily Living Home Assistive Devices/Equipment: None ADL Screening (condition at time of admission) Patient's cognitive ability adequate to safely complete daily activities?: Yes Is the patient deaf or have difficulty hearing?: No Does the patient have difficulty seeing, even when wearing glasses/contacts?: No Does the patient have difficulty concentrating,  remembering, or making decisions?: No Patient able to express need for assistance with ADLs?: Yes Does the patient have difficulty dressing or bathing?: No Independently performs ADLs?: Yes (appropriate for developmental age) Does the patient have difficulty walking or climbing stairs?: Yes Weakness of Legs: Both Weakness of Arms/Hands: Both  Permission Sought/Granted                  Emotional Assessment Appearance:: Appears stated age Attitude/Demeanor/Rapport: Engaged Affect (typically observed): Appropriate Orientation: : Oriented to Self, Oriented to Place, Oriented to  Time, Oriented to Situation Alcohol / Substance Use: Not Applicable Psych Involvement: No (comment)  Admission diagnosis:  Acute on chronic diastolic CHF (congestive heart failure), NYHA class 3 ([place]) [I50.33] Patient Active Problem List   Diagnosis Date Noted  . Acute on chronic diastolic CHF (congestive heart failure), NYHA class 3 ([place]) 09/04/2019  . Atrial fibrillation, new onset ([place])   . Atrial fibrillation with RVR ([place])   . CAP (community acquired pneumonia) 08/02/2019  . Leucocytosis 08/02/2019  . AKI (acute kidney injury) ([place]) 08/02/2019  . Hypoxia 08/02/2019  . Exudative age-related macular degeneration of right eye with active choroidal neovascularization ([place]) 07/09/2019  . Exudative age-related macular degeneration of left eye with active choroidal neovascularization ([place]) 07/09/2019  . Primary open angle glaucoma of left eye, mild stage 07/09/2019  . Advanced nonexudative age-related macular degeneration of right eye without subfoveal involvement 07/09/2019  . Macular pucker, right eye 07/09/2019  . Vitreomacular adhesion of right eye 07/09/2019  . Posterior vitreous detachment  of left eye 07/09/2019  . Confusion 04/30/2018  . Apraxia 04/30/2018  . HTN (hypertension) 04/30/2018  . HLD (hyperlipidemia) 04/30/2018  . Type 2 diabetes mellitus ([place]) 04/30/2018  . BPH (benign prostatic  hyperplasia) 04/30/2018  . Degenerative disc disease, lumbar 04/21/2018  . Pronation deformity of ankle, acquired 04/21/2018  . Gait abnormality 11/08/2016  . Dizziness and giddiness 06/21/2016  . Meniscus, lateral, anterior horn derangement 12/25/2011  . Meniscus, medial, bucket handle tear, old 12/25/2011  . Osteoarthritis of right knee 12/25/2011   PCP:  Javier Glazier, MD Pharmacy:   [place], [place] [address] [place] [address] Phone: [phone] Fax: [phone]  [facility] [place], [place] [address] Phone: [phone] Fax: [phone]     Social Determinants of Health (SDOH) Interventions    Readmission Risk Interventions No flowsheet data found.

## 2019-09-08 ENCOUNTER — Encounter (HOSPITAL_COMMUNITY): Payer: Self-pay | Admitting: Internal Medicine

## 2019-09-08 LAB — RENAL FUNCTION PANEL
Albumin: 3.2 g/dL — ABNORMAL LOW (ref 3.5–5.0)
Anion gap: 12 (ref 5–15)
BUN: 56 mg/dL — ABNORMAL HIGH (ref 8–23)
CO2: 23 mmol/L (ref 22–32)
Calcium: 8.5 mg/dL — ABNORMAL LOW (ref 8.9–10.3)
Chloride: 101 mmol/L (ref 98–111)
Creatinine, Ser: 3.59 mg/dL — ABNORMAL HIGH (ref 0.61–1.24)
GFR calc Af Amer: 17 mL/min — ABNORMAL LOW (ref >60)
GFR calc non Af Amer: 14 mL/min — ABNORMAL LOW (ref >60)
Glucose, Bld: 107 mg/dL — ABNORMAL HIGH (ref 70–99)
Phosphorus: 4.9 mg/dL — ABNORMAL HIGH (ref 2.5–4.6)
Potassium: 3.4 mmol/L — ABNORMAL LOW (ref 3.5–5.1)
Sodium: 136 mmol/L (ref 135–145)

## 2019-09-08 LAB — GLUCOSE, CAPILLARY
Glucose-Capillary: 111 mg/dL — ABNORMAL HIGH (ref 70–99)
Glucose-Capillary: 153 mg/dL — ABNORMAL HIGH (ref 70–99)

## 2019-09-08 MED ORDER — FUROSEMIDE 40 MG PO TABS: 80.0000 mg | ORAL_TABLET | Freq: Two times a day (BID) | ORAL | 3 refills | Status: DC

## 2019-09-08 MED ORDER — AMIODARONE HCL 200 MG PO TABS
100.0000 mg | ORAL_TABLET | Freq: Two times a day (BID) | ORAL | 3 refills | Status: DC
Start: 2019-09-08 — End: 2020-01-22

## 2019-09-08 MED ORDER — POTASSIUM CHLORIDE CRYS ER 20 MEQ PO TBCR
20.0000 meq | EXTENDED_RELEASE_TABLET | Freq: Once | ORAL | Status: AC
  Administered 2019-09-08: 20 meq via ORAL
  Filled 2019-09-08: qty 1

## 2019-09-08 NOTE — Discharge Summary (Signed)
Discharge Summary    Patient ID: Cailen Mihalik MRN: [id number]; DOB: [birth year]  Admit date: 09/04/2019 Discharge date: 09/08/2019  Primary Care Provider: Javier Glazier, MD  Primary Cardiologist: Dorris Carnes, MD  Primary Electrophysiologist:  None   Discharge Diagnoses    Principal Problem:   Acute on chronic diastolic CHF (congestive heart failure), NYHA class 3 ([place]) Active Problems:   HTN (hypertension)   AKI (acute kidney injury) ([place])   Persistent atrial fibrillation ([place])   Allergies No Known Allergies  Diagnostic Studies/Procedures    Radiology and ultrasound results below _____________   History of Present Illness     [name] is a 86 y.o. male with hx of HTN x 20 yrs, HL, BPH, aortic stenosis and CKD IV was recently diagnosed w/ atrial fib during hospital stay in May 2021. He was admitted 06/04 with acute on chronic diastolic CHF and anemia w/ AKI.   Hospital Course     Consultants: Nephrology   Mr Langille was diuresed w/ IV Lasix 80 mg IV bid. His Lasix was managed by the Nephrology, he will be discharged on 80 mg po bid. He lost 7 lbs during his stay, d/c weight 147.5 lbs.   His Cr was higher than usual on admission. BUN/Cr 44/2.49 a month ago>>60/3.43 on admit. His renal function was followed closely by Nephrology. With diuresis, peak labs were 62/3.70. Discharge labs are below, they are a little better. He is to get a BMET on Friday, results to Dr Posey Pronto and Dr [name]. Follow up with Nephrology has been arranged.   He was anemic, an iron profile was drawn, results below. He is iron deficient and got IV Feraheme. Management per Nephrology.  He was in sinus rhythm on admission, but had some atrial fib during his stay, he was continued on the amiodarone, the dose was decreased from 400 mg bid >>100 mg qd, but he will be on 100 mg bid at discharge.   He was on Eliquis 2.5 mg bid on admit, this was continued during his stay. Dr Posey Pronto was consulted because his CrCl is  < 15, but he is ok to stay on the reduced dose of Eliquis.   His Coreg and amlodipine were discontinued on admission. His hydralazine was continued. His SBP has been 110s-140s this admit. The Corega and amlodipine are still on hold, consider restart as outpt if better BP control needed.  On 06/08, he was seen by Dr [name] and all data were reviewed. He ambulated w/ staff and maintained O2 sats of 95% or more w/ ambulation. Volume status and respiratory status are at baseline. He is maintaining SR, no bleeding issues on Eliquis. His AS was moderate by echo last month, follow.   _____________  Discharge Vitals Blood pressure 132/75, pulse 76, temperature 98.2 F (36.8 C), temperature source Oral, resp. rate 18, height 5\' 4"  (1.626 m), weight 66.9 kg, SpO2 95 %.  Filed Weights   09/06/19 0406 09/07/19 0512 09/08/19 0404  Weight: 67.8 kg 67.9 kg 66.9 kg    Labs & Radiologic Studies    CBC Lab Results  Component Value Date   WBC 6.5 09/05/2019   HGB 9.1 (L) 09/05/2019   HCT 28.2 (L) 09/05/2019   MCV 91.3 09/05/2019   PLT 219 87/86/7672    Basic Metabolic Panel Recent Labs    09/07/19 0005 09/08/19 0651  NA 136 136  K 4.0 3.4*  CL 103 101  CO2 20* 23  GLUCOSE 121* 107*  BUN 57*  56*  CREATININE 3.72* 3.59*  CALCIUM 8.3* 8.5*  PHOS  --  4.9*   Liver Function Tests Recent Labs    09/08/19 0651  ALBUMIN 3.2*   BNP    Component Value Date/Time   BNP 291.4 (H) 09/05/2019 0545    ProBNP    Component Value Date/Time   PROBNP 3,337 (H) 09/04/2019 1103   Lab Results  Component Value Date   IRON 16 (L) 09/07/2019   TIBC 199 (L) 09/07/2019   FERRITIN 393 (H) 09/07/2019   _____________  DG Chest 2 View Result Date: 09/06/2019 CLINICAL DATA:  Cough. EXAM: CHEST - 2 VIEW COMPARISON:  None. FINDINGS: Mild, hazy left basilar atelectasis and/or infiltrate is seen. There is a small left pleural effusion. No pneumothorax is identified. The cardiac silhouette is mildly enlarged  and unchanged in size. The visualized skeletal structures are unremarkable. IMPRESSION: 1. Mild, hazy left basilar atelectasis and/or infiltrate. 2. Small left pleural effusion. Electronically Signed   By: Virgina Norfolk M.D.   On: 09/06/2019 16:58   US RENAL Result Date: 09/06/2019 CLINICAL DATA:  Stage IV chronic renal insufficiency EXAM: RENAL / URINARY TRACT ULTRASOUND COMPLETE COMPARISON:  08/04/2019 FINDINGS: Right Kidney: Renal measurements: 8.1 x 4.8 by 4.1 cm = volume: 82 mL. Increased renal cortical echotexture consistent with medical renal disease. 2.1 cm simple right renal cyst. Left Kidney: Renal measurements: 9.1 x 4.7 x 4.9 cm = volume: 109 mL. Increased renal cortical echotexture. No hydronephrosis or renal mass. Bladder: Appears normal for degree of bladder distention. Other: None. IMPRESSION: 1. Stable increased renal cortical echotexture consistent with medical renal disease. Electronically Signed   By: Randa Ngo M.D.   On: 09/06/2019 19:27   LONG TERM MONITOR-LIVE TELEMETRY (3-14 DAYS) Result Date: 09/07/2019 Sinus rhythm, bursts of atrial tachycardia and atrial fibrillation. 54 to 163 bpm   Average HR 73 bpm Occasional PVC  Disposition   Pt is being discharged home today in improved condition.  Follow-up Plans & Appointments    Follow-up Information    [facility] Follow up.   Specialty: [place] Why: HHRN,HHPT Contact information: [place] [address] [phone]        [place], [place]. Go on 09/21/2019.   Why: Appointment at 8:30 AM with Jen Mow PA (for Dr.Patel) Contact information: [place] [address] [phone]          Discharge Instructions    ([place]) Call MD:  Anytime you have any of the following symptoms: 1) 3 pound weight gain in 24 hours or 5 pounds in 1 week 2) shortness of breath, with or without a dry hacking cough 3) swelling in the hands, feet  or stomach 4) if you have to sleep on extra pillows at night in order to breathe.   Complete by: As directed    Diet - low sodium heart healthy   Complete by: As directed    Increase activity slowly   Complete by: As directed       Discharge Medications   Allergies as of 09/08/2019   No Known Allergies     Medication List    STOP taking these medications   amLODipine 5 MG tablet Commonly known as: NORVASC   carvedilol 6.25 MG tablet Commonly known as: COREG     TAKE these medications   acetaminophen 500 MG tablet Commonly known as: TYLENOL Take 500 mg by mouth daily as needed for headache (pain).  amiodarone 200 MG tablet Commonly known as: Pacerone Take 0.5 tablets (100 mg total) by mouth 2 (two) times daily. What changed: how much to take   apixaban 2.5 MG Tabs tablet Commonly known as: ELIQUIS Take 1 tablet (2.5 mg total) by mouth 2 (two) times daily.   atorvastatin 20 MG tablet Commonly known as: LIPITOR Take 1 tablet (20 mg total) by mouth daily.   betamethasone dipropionate 0.05 % ointment Commonly known as: DIPROLENE Apply 1 application topically daily as needed (itching/rash).   clobetasol cream 0.05 % Commonly known as: TEMOVATE Apply 1 application topically daily as needed (itching/rash).   Combigan 0.2-0.5 % ophthalmic solution Generic drug: brimonidine-timolol Place 1 drop into the left eye 2 (two) times daily.   ESTER C PO Take 1 tablet by mouth 2 (two) times daily.   febuxostat 40 MG tablet Commonly known as: ULORIC Take 40 mg by mouth See admin instructions. Take one tablet (40 mg) by mouth Tuesday, Friday, Sunday evening   finasteride 5 MG tablet Commonly known as: PROSCAR Take 5 mg by mouth daily.   furosemide 40 MG tablet Commonly known as: LASIX Take 2 tablets (80 mg total) by mouth 2 (two) times daily. What changed: how much to take   hydrALAZINE 100 MG tablet Commonly known as: APRESOLINE Take 1 tablet (100 mg total) by  mouth every 8 (eight) hours.   omeprazole 20 MG tablet Commonly known as: PRILOSEC OTC Take 20 mg by mouth daily with lunch.   PRESERVISION AREDS PO Take 1 capsule by mouth 2 (two) times daily.   Rocklatan 0.02-0.005 % Soln Generic drug: Netarsudil-Latanoprost Place 1 drop into the left eye at bedtime.   SODIUM BICARBONATE PO Take 1 capsule by mouth in the morning and at bedtime.   Vitamin D-3 125 MCG (5000 UT) Tabs Take 5,000 Units by mouth daily.          Outstanding Labs/Studies   None  Duration of Discharge Encounter   Greater than 30 minutes including physician time.  Signed, Rosaria Ferries, PA-C 09/08/2019, 11:35 AM

## 2019-09-08 NOTE — Progress Notes (Addendum)
Progress Note  Patient Name: Adam [name] Date of Encounter: 09/08/2019  Primary Cardiologist: Dorris Carnes, MD   Subjective   Breathign is OK   Inpatient Medications    Scheduled Meds: . amiodarone  100 mg Oral Daily  . apixaban  2.5 mg Oral BID  . atorvastatin  20 mg Oral Daily  . brimonidine-timolol  1 drop Both Eyes Q12H  . finasteride  5 mg Oral Daily  . furosemide  80 mg Oral BID  . hydrALAZINE  100 mg Oral Q8H  . Netarsudil-Latanoprost  1 drop Left Eye QHS  . pantoprazole  40 mg Oral Daily   Continuous Infusions: . ferumoxytol 510 mg (09/07/19 1610)   PRN Meds: acetaminophen, nitroGLYCERIN, ondansetron (ZOFRAN) IV   Vital Signs    Vitals:   09/07/19 1626 09/07/19 2046 09/08/19 0404 09/08/19 0958  BP: (!) 158/66 131/69 140/88 (!) 116/59  Pulse: 78 78 80 80  Resp: 17  18 20   Temp: 98.3 F (36.8 C) 98.2 F (36.8 C) 98.4 F (36.9 C) 98.1 F (36.7 C)  TempSrc: Oral Oral Oral Oral  SpO2: 94% 96% 96% 94%  Weight:   66.9 kg   Height:        Intake/Output Summary (Last 24 hours) at 09/08/2019 1044 Last data filed at 09/08/2019 1000 Gross per 24 hour  Intake 1300 ml  Output 1050 ml  Net 250 ml   Filed Weights   09/06/19 0406 09/07/19 0512 09/08/19 0404  Weight: 67.8 kg 67.9 kg 66.9 kg    Telemetry    SR      - Personally Reviewed  ECG    none - Personally Reviewed  Physical Exam   GEN: No acute distress.   Neck: JVP is normal  Cardiac: RRR,Gr III/VI systolic murmur base   Respiratory: Clear to auscultation bilaterally. GI: Soft, nontender, non-distended  MS: No edema; No deformity. Neuro:  Nonfocal  Psych: Normal affect   Labs    Chemistry Recent Labs  Lab 09/04/19 1103 09/05/19 0545 09/06/19 0821 09/07/19 0005 09/08/19 0651  NA 137   < > 138 136 136  K 5.0   < > 4.4 4.0 3.4*  CL 100   < > 102 103 101  CO2 19*   < > 21* 20* 23  GLUCOSE 162*   < > 192* 121* 107*  BUN 60*   < > 57* 57* 56*  CREATININE 3.43*   < > 3.53* 3.72* 3.59*   CALCIUM 8.6   < > 8.7* 8.3* 8.5*  PROT 5.9*  --   --   --   --   ALBUMIN 3.7  --   --   --  3.2*  AST 17  --   --   --   --   ALT 18  --   --   --   --   ALKPHOS 89  --   --   --   --   BILITOT 0.3  --   --   --   --   GFRNONAA 15*   < > 15* 14* 14*  GFRAA 18*   < > 17* 16* 17*  ANIONGAP  --    < > 15 13 12    < > = values in this interval not displayed.     Hematology Recent Labs  Lab 09/04/19 1103 09/05/19 0545  WBC 8.4 6.5  RBC 2.95* 3.09*  HGB 8.9* 9.1*  HCT 26.8* 28.2*  MCV 91 91.3  MCH  30.2 29.4  MCHC 33.2 32.3  RDW 13.6 14.6  PLT 231 219    Cardiac EnzymesNo results for input(s): TROPONINI in the last 168 hours. No results for input(s): TROPIPOC in the last 168 hours.   BNP Recent Labs  Lab 09/04/19 1103 09/05/19 0545  BNP  --  291.4*  PROBNP 3,337*  --      DDimer No results for input(s): DDIMER in the last 168 hours.   Radiology    DG Chest 2 View  Result Date: 09/06/2019 CLINICAL DATA:  Cough. EXAM: CHEST - 2 VIEW COMPARISON:  None. FINDINGS: Mild, hazy left basilar atelectasis and/or infiltrate is seen. There is a small left pleural effusion. No pneumothorax is identified. The cardiac silhouette is mildly enlarged and unchanged in size. The visualized skeletal structures are unremarkable. IMPRESSION: 1. Mild, hazy left basilar atelectasis and/or infiltrate. 2. Small left pleural effusion. Electronically Signed   By: Virgina Norfolk M.D.   On: 09/06/2019 16:58   US RENAL  Result Date: 09/06/2019 CLINICAL DATA:  Stage IV chronic renal insufficiency EXAM: RENAL / URINARY TRACT ULTRASOUND COMPLETE COMPARISON:  08/04/2019 FINDINGS: Right Kidney: Renal measurements: 8.1 x 4.8 by 4.1 cm = volume: 82 mL. Increased renal cortical echotexture consistent with medical renal disease. 2.1 cm simple right renal cyst. Left Kidney: Renal measurements: 9.1 x 4.7 x 4.9 cm = volume: 109 mL. Increased renal cortical echotexture. No hydronephrosis or renal mass. Bladder:  Appears normal for degree of bladder distention. Other: None. IMPRESSION: 1. Stable increased renal cortical echotexture consistent with medical renal disease. Electronically Signed   By: Randa Ngo M.D.   On: 09/06/2019 19:27   LONG TERM MONITOR-LIVE TELEMETRY (3-14 DAYS)  Result Date: 09/07/2019 Sinus rhythm, bursts of atrial tachycardia and atrial fibrillation. 54 to 163 bpm   Average HR 73 bpm Occasional PVC   Cardiac Studies   none  Patient Profile     86 y.o. male admitted with acute on chronic diastolic heart failure and volume overload and worsening renal dysfunction  Assessment & Plan    1. Acute on chronic diastolic heart failure - Pt has diuresed 1.8 L   Cr 3.59 today    Watch salt   Daughter to review with [facility] [employer] F/U closely as outpt     I2. Stage 4 renal failure -Patient seen by nephrology   Lasix 80 bid   Will follow up with BMET on Friday  Give 20 K today only  None as outpt until labs dictate. F/U in renal clinic  3. AS - moderate by echo back in May 2o21 with mean gradient of 30 mm Hg   Given renal function he prob is not a candidate for intervention in future   Watch salt   4. HTN - BP is OK on current regimen  It is very different regimen  Had been on amlodipine and carvedilol as well      5  Hx PAF   Remains in SR   Note amio was lowered to 100 qd  He deneis nausea now (? Due to amio vs fluid)   Reviewed with [facility]   Will increase to 100 bid      Hold on b blocker for now  Will follow as outpt.    6  HL  Keep on lipitor   .   For questions or updates, please contact [place] Please consult [email or web address] for contact info under Cardiology/STEMI.   Signed, Dorris Carnes, MD  09/08/2019, 10:44  AM  Patient ID: Adam [name], male   DOB: Apr 10, 1930, 86 y.o.   MRN: [id number]

## 2019-09-08 NOTE — Progress Notes (Signed)
Patient ID: Adam [name], male   DOB: 1930/11/07, 86 y.o.   MRN: [id number] [facility] Progress Note   Assessment/ Plan:   1. Acute kidney Injury on chronic kidney disease stage IV: Renal function slightly improved versus essentially unchanged overnight with creatinine of 3.6 this morning down from 3.7. Urine output lower as expected following conversion from intravenous to oral furosemide; he reports that his respiratory status is now closer to baseline and he is able to ambulate without shortness of breath. Stable enough to discharge home from a renal standpoint with outpatient follow-up in 2 to 3 weeks that will be set up. 2.  Acute exacerbation of chronic diastolic heart failure: Secondary to a lapse in compliance/dosing of diuretic therapy. Improved with intravenous diuretics that are now transitioned over to oral furosemide 80 mg twice daily. We discussed daily weights/symptom assessment. 3.  Atrial fibrillation: Appears to be rate controlled and currently in sinus rhythm.  On apixaban. 4.  Hypertension: Blood pressure under acceptable control, monitor with ongoing diuresis. 5.  Anemia: Without overt loss and likely from chronic illness, labs from yesterday showed iron deficiency for which he got intravenous Feraheme.  Subjective:   Reports to be feeling better, back to baseline.   Objective:   BP 140/88 (BP Location: Right Arm)   Pulse 80   Temp 98.4 F (36.9 C) (Oral)   Resp 18   Ht 5\' 4"  (1.626 m)   Wt 66.9 kg   SpO2 96%   BMI 25.32 kg/m   Intake/Output Summary (Last 24 hours) at 09/08/2019 9030 Last data filed at 09/08/2019 0818 Gross per 24 hour  Intake 1180 ml  Output 1050 ml  Net 130 ml   Weight change: -1.043 kg  Physical Exam: Gen: Comfortably ambulating in room with assistance, daughter at his side. CVS: Pulse regular rhythm, normal rate, S1 and S2 Resp: Clear to auscultation, no rales/rhonchi Abd: Soft, flat, nontender Ext: Trace right ankle edema,  left ankle no edema.  Imaging: DG Chest 2 View  Result Date: 09/06/2019 CLINICAL DATA:  Cough. EXAM: CHEST - 2 VIEW COMPARISON:  None. FINDINGS: Mild, hazy left basilar atelectasis and/or infiltrate is seen. There is a small left pleural effusion. No pneumothorax is identified. The cardiac silhouette is mildly enlarged and unchanged in size. The visualized skeletal structures are unremarkable. IMPRESSION: 1. Mild, hazy left basilar atelectasis and/or infiltrate. 2. Small left pleural effusion. Electronically Signed   By: Virgina Norfolk M.D.   On: 09/06/2019 16:58   US RENAL  Result Date: 09/06/2019 CLINICAL DATA:  Stage IV chronic renal insufficiency EXAM: RENAL / URINARY TRACT ULTRASOUND COMPLETE COMPARISON:  08/04/2019 FINDINGS: Right Kidney: Renal measurements: 8.1 x 4.8 by 4.1 cm = volume: 82 mL. Increased renal cortical echotexture consistent with medical renal disease. 2.1 cm simple right renal cyst. Left Kidney: Renal measurements: 9.1 x 4.7 x 4.9 cm = volume: 109 mL. Increased renal cortical echotexture. No hydronephrosis or renal mass. Bladder: Appears normal for degree of bladder distention. Other: None. IMPRESSION: 1. Stable increased renal cortical echotexture consistent with medical renal disease. Electronically Signed   By: Randa Ngo M.D.   On: 09/06/2019 19:27   LONG TERM MONITOR-LIVE TELEMETRY (3-14 DAYS)  Result Date: 09/07/2019 Sinus rhythm, bursts of atrial tachycardia and atrial fibrillation. 54 to 163 bpm   Average HR 73 bpm Occasional PVC   Labs: BMET Recent Labs  Lab 09/04/19 1103 09/05/19 0545 09/06/19 0821 09/07/19 0005 09/08/19 0651  NA 137 137 138 136 136  K 5.0 4.0 4.4 4.0 3.4*  CL 100 103 102 103 101  CO2 19* 22 21* 20* 23  GLUCOSE 162* 113* 192* 121* 107*  BUN 60* 62* 57* 57* 56*  CREATININE 3.43* 3.70* 3.53* 3.72* 3.59*  CALCIUM 8.6 8.5* 8.7* 8.3* 8.5*  PHOS  --   --   --   --  4.9*   CBC Recent Labs  Lab 09/04/19 1103 09/05/19 0545  WBC 8.4  6.5  HGB 8.9* 9.1*  HCT 26.8* 28.2*  MCV 91 91.3  PLT 231 219   Medications:    . amiodarone  100 mg Oral Daily  . apixaban  2.5 mg Oral BID  . atorvastatin  20 mg Oral Daily  . brimonidine-timolol  1 drop Both Eyes Q12H  . finasteride  5 mg Oral Daily  . furosemide  80 mg Oral BID  . hydrALAZINE  100 mg Oral Q8H  . Netarsudil-Latanoprost  1 drop Left Eye QHS  . pantoprazole  40 mg Oral Daily   Elmarie Shiley, MD 09/08/2019, 9:07 AM

## 2019-09-08 NOTE — Discharge Instructions (Signed)
BMET to be drawn by [facility] on Friday, 09/11/2019 for acute kidney injury on chronic kidney disease. Results to Dr Posey Pronto (Nephrology) and Dr [name] (cardiology)

## 2019-09-08 NOTE — TOC Transition Note (Signed)
Transition of Care [facility]) - CM/SW Discharge Note   Patient Details  Name: Kayn Haymore MRN: [id number] Date of Birth: 06-19-1930  Transition of Care [facility]) CM/SW Contact:  Zenon Mayo, RN Phone Number: 09/08/2019, 12:23 PM   Clinical Narrative:    Patient for dc today, Cory notified with Alvis Lemmings.   Final next level of care: [place] Barriers to Discharge: No Barriers Identified   Patient Goals and CMS Choice Patient states their goals for this hospitalization and ongoing recovery are:: get better CMS Medicare.gov Compare Post Acute Care list provided to:: Patient Represenative (must comment) Choice offered to / list presented to : Adult Children  Discharge Placement                       Discharge Plan and Services   Discharge Planning Services: CM Consult Post Acute Care Choice: Resumption of Svcs/PTA Provider            DME Agency: NA       HH Arranged: RN, PT [place] Agency: [place] Date [facility] Agency Contacted: 09/07/19 Time HH Agency Contacted: 1300 Representative spoke with at [place]: [place] ([place]) Interventions     Readmission Risk Interventions No flowsheet data found.

## 2019-09-08 NOTE — Progress Notes (Signed)
  Mobility Specialist Criteria Algorithm Info.  SATURATION QUALIFICATIONS: (This note is used to comply with regulatory documentation for home oxygen)  Patient Saturations on Room Air at Rest = 98%  Patient Saturations on Room Air while Ambulating = 95%  Patient Saturations on n/a Liters of oxygen while Ambulating = n/a%  Please briefly explain why patient needs home oxygen:   Mobility Team:  [facility] elevated:Self regulated Activity: Ambulated in hall;Transferred:  Bed to chair (Ambulated to sink to wash face, comb hair and brush teeth. Ambulated in hall then to chair after.) Range of motion: Active;All extremities Level of assistance: Modified independent, requires aide device or extra time Assistive device: Front wheel walker Minutes sitting in chair:  Minutes stood: 5 minutes Minutes ambulated: 5 minutes Distance ambulated (ft): 480 ft Mobility response: Tolerated well Bed Position: Chair ([place] chair)  Patient was eager and willing to participate in mobility this morning as he is feeling close to his baseline. Ambulated the entire length of hallway Mod I. without a need for a rest break. Tolerated ambulation well with no complaints of dizziness/lightheadedness or pain. Mentioned having some SOB but nothing out of the ordinary, reporting having slight SOB at baseline.   09/08/2019 9:29 AM

## 2019-09-08 NOTE — Progress Notes (Signed)
Patient's daughter is at the bedside, went over the discharge instructions with the patient's and patient's daughter. Patient verbalizes understanding of follow up visit with [facility] and medication changes. IV has been removed, CCMD has been notified and tele monitor has been taken off the patient. Patient will be discharge after having lunch.

## 2019-09-10 ENCOUNTER — Encounter (INDEPENDENT_AMBULATORY_CARE_PROVIDER_SITE_OTHER): Payer: Medicare Other | Admitting: Ophthalmology

## 2019-09-10 DIAGNOSIS — N1832 Chronic kidney disease, stage 3b: Secondary | ICD-10-CM | POA: Diagnosis not present

## 2019-09-10 DIAGNOSIS — I2510 Atherosclerotic heart disease of native coronary artery without angina pectoris: Secondary | ICD-10-CM | POA: Diagnosis not present

## 2019-09-10 DIAGNOSIS — I4891 Unspecified atrial fibrillation: Secondary | ICD-10-CM | POA: Diagnosis not present

## 2019-09-10 DIAGNOSIS — E785 Hyperlipidemia, unspecified: Secondary | ICD-10-CM | POA: Diagnosis not present

## 2019-09-10 DIAGNOSIS — K449 Diaphragmatic hernia without obstruction or gangrene: Secondary | ICD-10-CM | POA: Diagnosis not present

## 2019-09-10 DIAGNOSIS — I0981 Rheumatic heart failure: Secondary | ICD-10-CM | POA: Diagnosis not present

## 2019-09-10 DIAGNOSIS — E1122 Type 2 diabetes mellitus with diabetic chronic kidney disease: Secondary | ICD-10-CM | POA: Diagnosis not present

## 2019-09-10 DIAGNOSIS — M479 Spondylosis, unspecified: Secondary | ICD-10-CM | POA: Diagnosis not present

## 2019-09-10 DIAGNOSIS — Q6101 Congenital single renal cyst: Secondary | ICD-10-CM | POA: Diagnosis not present

## 2019-09-10 DIAGNOSIS — J9601 Acute respiratory failure with hypoxia: Secondary | ICD-10-CM | POA: Diagnosis not present

## 2019-09-10 DIAGNOSIS — M1909 Primary osteoarthritis, other specified site: Secondary | ICD-10-CM | POA: Diagnosis not present

## 2019-09-10 DIAGNOSIS — Z7901 Long term (current) use of anticoagulants: Secondary | ICD-10-CM | POA: Diagnosis not present

## 2019-09-10 DIAGNOSIS — J189 Pneumonia, unspecified organism: Secondary | ICD-10-CM | POA: Diagnosis not present

## 2019-09-10 DIAGNOSIS — I5032 Chronic diastolic (congestive) heart failure: Secondary | ICD-10-CM | POA: Diagnosis not present

## 2019-09-10 DIAGNOSIS — I083 Combined rheumatic disorders of mitral, aortic and tricuspid valves: Secondary | ICD-10-CM | POA: Diagnosis not present

## 2019-09-10 DIAGNOSIS — N179 Acute kidney failure, unspecified: Secondary | ICD-10-CM | POA: Diagnosis not present

## 2019-09-10 DIAGNOSIS — I130 Hypertensive heart and chronic kidney disease with heart failure and stage 1 through stage 4 chronic kidney disease, or unspecified chronic kidney disease: Secondary | ICD-10-CM | POA: Diagnosis not present

## 2019-09-10 DIAGNOSIS — J9811 Atelectasis: Secondary | ICD-10-CM | POA: Diagnosis not present

## 2019-09-10 DIAGNOSIS — N400 Enlarged prostate without lower urinary tract symptoms: Secondary | ICD-10-CM | POA: Diagnosis not present

## 2019-09-10 DIAGNOSIS — D72829 Elevated white blood cell count, unspecified: Secondary | ICD-10-CM | POA: Diagnosis not present

## 2019-09-10 DIAGNOSIS — Z9181 History of falling: Secondary | ICD-10-CM | POA: Diagnosis not present

## 2019-09-10 DIAGNOSIS — I700 Atherosclerosis of aorta: Secondary | ICD-10-CM | POA: Diagnosis not present

## 2019-09-11 DIAGNOSIS — J189 Pneumonia, unspecified organism: Secondary | ICD-10-CM | POA: Diagnosis not present

## 2019-09-11 DIAGNOSIS — I130 Hypertensive heart and chronic kidney disease with heart failure and stage 1 through stage 4 chronic kidney disease, or unspecified chronic kidney disease: Secondary | ICD-10-CM | POA: Diagnosis not present

## 2019-09-11 DIAGNOSIS — I0981 Rheumatic heart failure: Secondary | ICD-10-CM | POA: Diagnosis not present

## 2019-09-11 DIAGNOSIS — I5032 Chronic diastolic (congestive) heart failure: Secondary | ICD-10-CM | POA: Diagnosis not present

## 2019-09-11 DIAGNOSIS — I2510 Atherosclerotic heart disease of native coronary artery without angina pectoris: Secondary | ICD-10-CM | POA: Diagnosis not present

## 2019-09-11 DIAGNOSIS — E1122 Type 2 diabetes mellitus with diabetic chronic kidney disease: Secondary | ICD-10-CM | POA: Diagnosis not present

## 2019-09-14 ENCOUNTER — Encounter (INDEPENDENT_AMBULATORY_CARE_PROVIDER_SITE_OTHER): Payer: Medicare Other | Admitting: Ophthalmology

## 2019-09-14 DIAGNOSIS — I5032 Chronic diastolic (congestive) heart failure: Secondary | ICD-10-CM | POA: Diagnosis not present

## 2019-09-14 DIAGNOSIS — I0981 Rheumatic heart failure: Secondary | ICD-10-CM | POA: Diagnosis not present

## 2019-09-14 DIAGNOSIS — I2510 Atherosclerotic heart disease of native coronary artery without angina pectoris: Secondary | ICD-10-CM | POA: Diagnosis not present

## 2019-09-14 DIAGNOSIS — E1122 Type 2 diabetes mellitus with diabetic chronic kidney disease: Secondary | ICD-10-CM | POA: Diagnosis not present

## 2019-09-14 DIAGNOSIS — I130 Hypertensive heart and chronic kidney disease with heart failure and stage 1 through stage 4 chronic kidney disease, or unspecified chronic kidney disease: Secondary | ICD-10-CM | POA: Diagnosis not present

## 2019-09-14 DIAGNOSIS — J189 Pneumonia, unspecified organism: Secondary | ICD-10-CM | POA: Diagnosis not present

## 2019-09-15 DIAGNOSIS — I2510 Atherosclerotic heart disease of native coronary artery without angina pectoris: Secondary | ICD-10-CM | POA: Diagnosis not present

## 2019-09-15 DIAGNOSIS — I5032 Chronic diastolic (congestive) heart failure: Secondary | ICD-10-CM | POA: Diagnosis not present

## 2019-09-15 DIAGNOSIS — I130 Hypertensive heart and chronic kidney disease with heart failure and stage 1 through stage 4 chronic kidney disease, or unspecified chronic kidney disease: Secondary | ICD-10-CM | POA: Diagnosis not present

## 2019-09-15 DIAGNOSIS — I0981 Rheumatic heart failure: Secondary | ICD-10-CM | POA: Diagnosis not present

## 2019-09-15 DIAGNOSIS — J189 Pneumonia, unspecified organism: Secondary | ICD-10-CM | POA: Diagnosis not present

## 2019-09-15 DIAGNOSIS — E1122 Type 2 diabetes mellitus with diabetic chronic kidney disease: Secondary | ICD-10-CM | POA: Diagnosis not present

## 2019-09-17 DIAGNOSIS — J189 Pneumonia, unspecified organism: Secondary | ICD-10-CM | POA: Diagnosis not present

## 2019-09-17 DIAGNOSIS — I5032 Chronic diastolic (congestive) heart failure: Secondary | ICD-10-CM | POA: Diagnosis not present

## 2019-09-17 DIAGNOSIS — I0981 Rheumatic heart failure: Secondary | ICD-10-CM | POA: Diagnosis not present

## 2019-09-17 DIAGNOSIS — I2510 Atherosclerotic heart disease of native coronary artery without angina pectoris: Secondary | ICD-10-CM | POA: Diagnosis not present

## 2019-09-17 DIAGNOSIS — I130 Hypertensive heart and chronic kidney disease with heart failure and stage 1 through stage 4 chronic kidney disease, or unspecified chronic kidney disease: Secondary | ICD-10-CM | POA: Diagnosis not present

## 2019-09-17 DIAGNOSIS — E1122 Type 2 diabetes mellitus with diabetic chronic kidney disease: Secondary | ICD-10-CM | POA: Diagnosis not present

## 2019-09-22 DIAGNOSIS — I2510 Atherosclerotic heart disease of native coronary artery without angina pectoris: Secondary | ICD-10-CM | POA: Diagnosis not present

## 2019-09-22 DIAGNOSIS — I0981 Rheumatic heart failure: Secondary | ICD-10-CM | POA: Diagnosis not present

## 2019-09-22 DIAGNOSIS — J189 Pneumonia, unspecified organism: Secondary | ICD-10-CM | POA: Diagnosis not present

## 2019-09-22 DIAGNOSIS — E1122 Type 2 diabetes mellitus with diabetic chronic kidney disease: Secondary | ICD-10-CM | POA: Diagnosis not present

## 2019-09-22 DIAGNOSIS — I5032 Chronic diastolic (congestive) heart failure: Secondary | ICD-10-CM | POA: Diagnosis not present

## 2019-09-22 DIAGNOSIS — I130 Hypertensive heart and chronic kidney disease with heart failure and stage 1 through stage 4 chronic kidney disease, or unspecified chronic kidney disease: Secondary | ICD-10-CM | POA: Diagnosis not present

## 2019-09-24 ENCOUNTER — Encounter: Payer: Self-pay | Admitting: *Deleted

## 2019-09-24 ENCOUNTER — Other Ambulatory Visit: Payer: Self-pay | Admitting: *Deleted

## 2019-09-24 NOTE — Patient Outreach (Signed)
[facility]) Care Management [facility] CM Telephone Outreach, EMMI red-Alert notification/ General Discharge Post-[name] discharge day # 16 Unsuccessful (consecutive) outreach attempt # 1- new referral  09/24/2019  Adam [name] 1930-04-05 [id number]  EMMI red-Alert notification/ General Discharge EMMI call date/ day #: Sunday September 13, 2019; day # 4 Red-Alert reason(s): "Lost interest"  Unsuccessful initial telephone outreach to Adam [name], 86 y/o male referred to this [facility] RN CM this morning on re-assignment from original referral placed 09/14/19, for EMMI red-Alert notification as above.  Patient has recent [name] admission June 4-8, 2021 for CHF exacerbation and was discharged home to self-care with home [name] services through [place].  Patient lives at [facility].  Patient has history including, but not limited to,   HIPAA compliant voice mail message left for patient, requesting return call back.  Plan:  Will place Adam [name] CM unsuccessful patient outreach letter in mail requesting call back in writing  Will re-attempt Adam [name] CM telephone outreach within 4 business days if I do not hear back from patient first  Adam Rack, RN, BSN, [place] Management  [phone]   ----- Message ----- From: Adam [name] Sent: 4/97/5300  11:15 AM EDT To: Adam Royalty, RN, Adam Colonel, RN Subject: FW: RED EMMI ON APL                            Good Morning Adam [name],  Reassigned Red EMMI per Adam [name].  Thank you.  Adam [name] [phone]

## 2019-09-25 DIAGNOSIS — I130 Hypertensive heart and chronic kidney disease with heart failure and stage 1 through stage 4 chronic kidney disease, or unspecified chronic kidney disease: Secondary | ICD-10-CM | POA: Diagnosis not present

## 2019-09-25 DIAGNOSIS — E1122 Type 2 diabetes mellitus with diabetic chronic kidney disease: Secondary | ICD-10-CM | POA: Diagnosis not present

## 2019-09-25 DIAGNOSIS — J189 Pneumonia, unspecified organism: Secondary | ICD-10-CM | POA: Diagnosis not present

## 2019-09-25 DIAGNOSIS — I0981 Rheumatic heart failure: Secondary | ICD-10-CM | POA: Diagnosis not present

## 2019-09-25 DIAGNOSIS — I5032 Chronic diastolic (congestive) heart failure: Secondary | ICD-10-CM | POA: Diagnosis not present

## 2019-09-25 DIAGNOSIS — I2510 Atherosclerotic heart disease of native coronary artery without angina pectoris: Secondary | ICD-10-CM | POA: Diagnosis not present

## 2019-09-29 DIAGNOSIS — E1122 Type 2 diabetes mellitus with diabetic chronic kidney disease: Secondary | ICD-10-CM | POA: Diagnosis not present

## 2019-09-29 DIAGNOSIS — I2510 Atherosclerotic heart disease of native coronary artery without angina pectoris: Secondary | ICD-10-CM | POA: Diagnosis not present

## 2019-09-29 DIAGNOSIS — I0981 Rheumatic heart failure: Secondary | ICD-10-CM | POA: Diagnosis not present

## 2019-09-29 DIAGNOSIS — I5032 Chronic diastolic (congestive) heart failure: Secondary | ICD-10-CM | POA: Diagnosis not present

## 2019-09-29 DIAGNOSIS — J189 Pneumonia, unspecified organism: Secondary | ICD-10-CM | POA: Diagnosis not present

## 2019-09-29 DIAGNOSIS — I130 Hypertensive heart and chronic kidney disease with heart failure and stage 1 through stage 4 chronic kidney disease, or unspecified chronic kidney disease: Secondary | ICD-10-CM | POA: Diagnosis not present

## 2019-09-30 ENCOUNTER — Encounter: Payer: Self-pay | Admitting: *Deleted

## 2019-09-30 ENCOUNTER — Other Ambulatory Visit: Payer: Self-pay | Admitting: *Deleted

## 2019-09-30 NOTE — Patient Outreach (Signed)
[facility]) Care Management [facility] CM Telephone Outreach, [place] Red Alert notification from 09/14/19  09/30/2019  Adam [name] 1931/03/12 [id number]  EMMI red-Alert notification/ General Discharge EMMI call date/ day #: Sunday September 13, 2019; day # 4 Red-Alert reason(s): "Lost interest"  Successful second telephone outreach attemp to [name], 86 y/o male referred to this [facility] RN CM 09/24/19 on re-assignment from original referral placed 09/14/19, for EMMI red-Alert notification as above.  Patient has recent hospital admission June 4-8, 2021 for CHF exacerbation and was discharged home to self-care with home health services through [facility].  Patient lives at [facility] facility.  Patient has history including, but not limited to, HTN/ HLD; A-Fib; dCHF; DM- type II; macular degeneration; DJD; and BPH.  HIPAA/ identity verified during phone call today and purpose of call discussed with patient; patient reports doing "very good" and states he has not experienced depression/ sadness/ hopelessness or loss of interest; states that this has all resolved post- hospital discharge in early June.  He declines participation in screening call/ ongoing [facility] CM services, stating that he lives at [facility] and has any needed help from the staff and his local daughters.  He also adds that [facility] home health services are almost complete as he is doing so well post-hospital discharge.  Patient denies further issues, concerns, or problems today.    Plan:  Will make patient's PCP aware that patient has declined ongoing [facility] CM services today and will make patient inactive with [facility] CM  Oneta Rack, RN, BSN, [place] Care Management  [phone]

## 2019-10-06 DIAGNOSIS — I5032 Chronic diastolic (congestive) heart failure: Secondary | ICD-10-CM | POA: Diagnosis not present

## 2019-10-06 DIAGNOSIS — I2510 Atherosclerotic heart disease of native coronary artery without angina pectoris: Secondary | ICD-10-CM | POA: Diagnosis not present

## 2019-10-06 DIAGNOSIS — I0981 Rheumatic heart failure: Secondary | ICD-10-CM | POA: Diagnosis not present

## 2019-10-06 DIAGNOSIS — J189 Pneumonia, unspecified organism: Secondary | ICD-10-CM | POA: Diagnosis not present

## 2019-10-06 DIAGNOSIS — I130 Hypertensive heart and chronic kidney disease with heart failure and stage 1 through stage 4 chronic kidney disease, or unspecified chronic kidney disease: Secondary | ICD-10-CM | POA: Diagnosis not present

## 2019-10-06 DIAGNOSIS — E1122 Type 2 diabetes mellitus with diabetic chronic kidney disease: Secondary | ICD-10-CM | POA: Diagnosis not present

## 2019-10-07 ENCOUNTER — Ambulatory Visit (INDEPENDENT_AMBULATORY_CARE_PROVIDER_SITE_OTHER): Payer: Medicare Other | Admitting: Ophthalmology

## 2019-10-07 ENCOUNTER — Encounter (INDEPENDENT_AMBULATORY_CARE_PROVIDER_SITE_OTHER): Payer: Self-pay | Admitting: Ophthalmology

## 2019-10-07 ENCOUNTER — Other Ambulatory Visit: Payer: Self-pay

## 2019-10-07 DIAGNOSIS — H353211 Exudative age-related macular degeneration, right eye, with active choroidal neovascularization: Secondary | ICD-10-CM | POA: Diagnosis not present

## 2019-10-07 MED ORDER — BEVACIZUMAB CHEMO INJECTION 1.25MG/0.05ML SYRINGE FOR KALEIDOSCOPE
1.2500 mg | INTRAVITREAL | Status: AC | PRN
Start: 2019-10-07 — End: 2019-10-07
  Administered 2019-10-07: 1.25 mg via INTRAVITREAL

## 2019-10-07 NOTE — Assessment & Plan Note (Signed)
Repeat intravitreal Avastin today.  We will need to shorten interval examination to 8 weeks.

## 2019-10-07 NOTE — Progress Notes (Signed)
10/07/2019     CHIEF COMPLAINT Patient presents for Retina Follow Up   HISTORY OF PRESENT ILLNESS: Adam [name] is a 86 y.o. male who presents to the clinic today for:   HPI    Retina Follow Up    Patient presents with  Wet AMD.  In right eye.  Duration of 11 weeks.  Since onset it is stable.          Comments    11 week follow up - OCT OU, Poss Avastin OD Patient denies change in vision and overall has no complaints.        Last edited by Gerda Diss on 10/07/2019  9:41 AM. (History)      Referring physician: Javier Glazier, MD [place],  [place] [address]  HISTORICAL INFORMATION:   Selected notes from the MEDICAL RECORD NUMBER    Lab Results  Component Value Date   HGBA1C 5.7 (H) 08/03/2019     CURRENT MEDICATIONS: Current Outpatient Medications (Ophthalmic Drugs)  Medication Sig  . brimonidine-timolol (COMBIGAN) 0.2-0.5 % ophthalmic solution Place 1 drop into the left eye 2 (two) times daily.   . Netarsudil-Latanoprost (ROCKLATAN) 0.02-0.005 % SOLN Place 1 drop into the left eye at bedtime.    No current facility-administered medications for this visit. (Ophthalmic Drugs)   Current Outpatient Medications (Other)  Medication Sig  . acetaminophen (TYLENOL) 500 MG tablet Take 500 mg by mouth daily as needed for headache (pain).  Marland Kitchen amiodarone (PACERONE) 200 MG tablet Take 0.5 tablets (100 mg total) by mouth 2 (two) times daily.  Marland Kitchen apixaban (ELIQUIS) 2.5 MG TABS tablet Take 1 tablet (2.5 mg total) by mouth 2 (two) times daily.  Marland Kitchen atorvastatin (LIPITOR) 20 MG tablet Take 1 tablet (20 mg total) by mouth daily.  . betamethasone dipropionate (DIPROLENE) 0.05 % ointment Apply 1 application topically daily as needed (itching/rash).   Marland Kitchen Bioflavonoid Products (ESTER C PO) Take 1 tablet by mouth 2 (two) times daily.  . Cholecalciferol (VITAMIN D-3) 125 MCG (5000 UT) TABS Take 5,000 Units by mouth daily.  . clobetasol cream (TEMOVATE) 9.98 % Apply 1  application topically daily as needed (itching/rash).   . febuxostat (ULORIC) 40 MG tablet Take 40 mg by mouth See admin instructions. Take one tablet (40 mg) by mouth Tuesday, Friday, Sunday evening  . finasteride (PROSCAR) 5 MG tablet Take 5 mg by mouth daily.   . furosemide (LASIX) 40 MG tablet Take 2 tablets (80 mg total) by mouth 2 (two) times daily.  . hydrALAZINE (APRESOLINE) 100 MG tablet Take 1 tablet (100 mg total) by mouth every 8 (eight) hours.  . Multiple Vitamins-Minerals (PRESERVISION AREDS PO) Take 1 capsule by mouth 2 (two) times daily.   Marland Kitchen omeprazole (PRILOSEC OTC) 20 MG tablet Take 20 mg by mouth daily with lunch.   . SODIUM BICARBONATE PO Take 1 capsule by mouth in the morning and at bedtime.   No current facility-administered medications for this visit. (Other)      REVIEW OF SYSTEMS:    ALLERGIES No Known Allergies  PAST MEDICAL HISTORY Past Medical History:  Diagnosis Date  . Acute meniscal tear of knee RIGHT KNEE  . Arthritis   . BPH (benign prostatic hypertrophy)   . Colon polyps    hyperplastic  . Diabetes mellitus type 2, diet-controlled (HCC) AND EXERCISE-- LAST A1C  5.6  . Diverticulosis   . Gait abnormality 11/08/2016  . GERD (gastroesophageal reflux disease)   . Glaucoma   .  H/O hiatal hernia   . Heart murmur   . History of esophageal stricture S/P DILATION IN 2007  . Hyperlipidemia   . Hypertension   . IgM monoclonal gammopathy of uncertain significance ASYMPTOMATIC    FOLLOWED BY DR Alen Blew  . Internal hemorrhoids   . Macrocytic anemia DUE TO CHRONIC RENAL INSUFF.  . Nocturia   . Persistent atrial fibrillation ([place])   . Right knee pain    Past Surgical History:  Procedure Laterality Date  . CATARACT EXTRACTION W/ INTRAOCULAR LENS  IMPLANT, BILATERAL  2012  . HEMORRHOID SURGERY    . HERNIA REPAIR    . KNEE ARTHROSCOPY  12/25/2011   Procedure: ARTHROSCOPY KNEE;  Surgeon: Tobi Bastos, MD;  Location: [facility];   Service: Orthopedics;  Laterality: Right;  right knee arthroscopy with medial menisectomy      FAMILY HISTORY Family History  Problem Relation Age of Onset  . Diabetes Brother   . Heart attack Father   . Early death Father   . Hearing loss Father   . Heart disease Father   . Alcohol abuse Sister   . Cancer Sister     SOCIAL HISTORY Social History   Tobacco Use  . Smoking status: Former Smoker    Packs/day: 1.00    Years: 10.00    Pack years: 10.00    Types: Cigarettes  . Smokeless tobacco: Never Used  Vaping Use  . Vaping Use: Never assessed  Substance Use Topics  . Alcohol use: Yes    Alcohol/week: 14.0 standard drinks    Types: 14 Standard drinks or equivalent per week  . Drug use: No         OPHTHALMIC EXAM:  Base Eye Exam    Visual Acuity (Snellen - Linear)      Right Left   Dist [place] 20/50+1 20/25-1   Dist ph [place] NI        Tonometry (Tonopen, 9:49 AM)      Right Left   Pressure 9 7       Pupils      Pupils Dark Light Shape React APD   Right PERRL 3 2 Round Slow None   Left PERRL 3 2 Round Slow +2       Visual Fields (Counting fingers)      Left Right     Full   Restrictions Partial outer inferior temporal, inferior nasal deficiencies        Extraocular Movement      Right Left    Full Full       Neuro/Psych    Oriented x3: Yes   Mood/Affect: Normal       Dilation    Right eye: 1.0% Mydriacyl, 2.5% Phenylephrine @ 9:49 AM        Slit Lamp and Fundus Exam    External Exam      Right Left   External Normal Normal       Slit Lamp Exam      Right Left   Lids/Lashes Normal Normal   Conjunctiva/Sclera White and quiet White and quiet   Cornea Clear Clear   Anterior Chamber Deep and quiet Deep and quiet   Iris Round and reactive Round and reactive   Lens Posterior chamber intraocular lens Posterior chamber intraocular lens   Anterior Vitreous Normal Normal       Fundus Exam      Right Left   Posterior Vitreous Posterior  vitreous detachment    Disc  Normal    C/D Ratio 0.2    Macula Retinal pigment epithelial mottling, Subretinal neovascular membrane, no hemorrhage, Retinal pigment epithelial atrophy, Macular atrophy, Early age related macular degeneration, no exudates    Vessels Normal    Periphery Normal           IMAGING AND PROCEDURES  Imaging and Procedures for 10/07/19  OCT, Retina - OU - Both Eyes       Right Eye Quality was good. Scan locations included subfoveal. Central Foveal Thickness: 410. Progression has worsened. Findings include cystoid macular edema, epiretinal membrane, retinal drusen .   Left Eye Quality was good. Scan locations included subfoveal. Central Foveal Thickness: 270. Progression has been stable. Findings include no SRF, no IRF, subretinal scarring, retinal drusen .   Notes At 11 weeks, more CME centrally OD repeat Avastin vitreal OD       Intravitreal Injection, Pharmacologic Agent - OD - Right Eye       Time Out 10/07/2019. 11:04 AM. Confirmed correct patient, procedure, site, and patient consented.   Anesthesia Topical anesthesia was used. Anesthetic medications included Akten 3.5%.   Procedure Preparation included 10% betadine to eyelids, Tobramycin 0.3%. A 30 gauge needle was used.   Injection:  1.25 mg Bevacizumab (AVASTIN) SOLN   NDC: [address]-1, Lot: [id number]   Route: Intravitreal, Site: Right Eye, Waste: 0 mg  Post-op Post injection exam found visual acuity of at least counting fingers. The patient tolerated the procedure well. There were no complications. The patient received written and verbal post procedure care education. Post injection medications were not given.                 ASSESSMENT/PLAN:  Exudative age-related macular degeneration of right eye with active choroidal neovascularization (HCC) Repeat intravitreal Avastin today.  We will need to shorten interval examination to 8 weeks.      ICD-10-CM   1. Exudative age-related  macular degeneration of right eye with active choroidal neovascularization (HCC)  H35.3211 OCT, Retina - OU - Both Eyes    Intravitreal Injection, Pharmacologic Agent - OD - Right Eye    Bevacizumab (AVASTIN) SOLN 1.25 mg    1.  Repeat intravitreal Avastin OD today, 11 weeks.  Macular findings on OCT are worse, will repeat examination OD in 8 weeks  2.  Follow-up OS for examination as scheduled  3.  Ophthalmic Meds Ordered this visit:  Meds ordered this encounter  Medications  . Bevacizumab (AVASTIN) SOLN 1.25 mg       Return in about 8 weeks (around 12/02/2019) for dilate, OD, AVASTIN OCT.  There are no Patient Instructions on file for this visit.   Explained the diagnoses, plan, and follow up with the patient and they expressed understanding.  Patient expressed understanding of the importance of proper follow up care.   Clent Demark Marlia Schewe M.D. Diseases & Surgery of the Retina and Vitreous Retina & Diabetic [place] 10/07/19     Abbreviations: M myopia (nearsighted); A astigmatism; H hyperopia (farsighted); P presbyopia; Mrx spectacle prescription;  CTL contact lenses; OD right eye; OS left eye; OU both eyes  XT exotropia; ET esotropia; PEK punctate epithelial keratitis; PEE punctate epithelial erosions; DES dry eye syndrome; MGD meibomian gland dysfunction; ATs artificial tears; PFAT's preservative free artificial tears; [place] nuclear sclerotic cataract; PSC posterior subcapsular cataract; ERM epi-retinal membrane; PVD posterior vitreous detachment; RD retinal detachment; DM diabetes mellitus; DR diabetic retinopathy; NPDR non-proliferative diabetic retinopathy; PDR proliferative diabetic retinopathy; CSME clinically significant macular edema;  DME diabetic macular edema; dbh dot blot hemorrhages; CWS cotton wool spot; POAG primary open angle glaucoma; C/D cup-to-disc ratio; HVF humphrey visual field; GVF goldmann visual field; OCT optical coherence tomography; IOP intraocular pressure;  BRVO Branch retinal vein occlusion; CRVO central retinal vein occlusion; CRAO central retinal artery occlusion; BRAO branch retinal artery occlusion; RT retinal tear; SB scleral buckle; PPV pars plana vitrectomy; VH Vitreous hemorrhage; PRP panretinal laser photocoagulation; IVK intravitreal kenalog; VMT vitreomacular traction; MH Macular hole;  NVD neovascularization of the disc; NVE neovascularization elsewhere; AREDS age related eye disease study; ARMD age related macular degeneration; POAG primary open angle glaucoma; EBMD epithelial/anterior basement membrane dystrophy; ACIOL anterior chamber intraocular lens; IOL intraocular lens; PCIOL posterior chamber intraocular lens; Phaco/IOL phacoemulsification with intraocular lens placement; [place] photorefractive keratectomy; LASIK laser assisted in situ keratomileusis; HTN hypertension; DM diabetes mellitus; COPD chronic obstructive pulmonary disease

## 2019-10-20 DIAGNOSIS — N2581 Secondary hyperparathyroidism of renal origin: Secondary | ICD-10-CM | POA: Diagnosis not present

## 2019-10-20 DIAGNOSIS — N184 Chronic kidney disease, stage 4 (severe): Secondary | ICD-10-CM | POA: Diagnosis not present

## 2019-10-20 DIAGNOSIS — I129 Hypertensive chronic kidney disease with stage 1 through stage 4 chronic kidney disease, or unspecified chronic kidney disease: Secondary | ICD-10-CM | POA: Diagnosis not present

## 2019-10-20 DIAGNOSIS — D631 Anemia in chronic kidney disease: Secondary | ICD-10-CM | POA: Diagnosis not present

## 2019-10-22 ENCOUNTER — Other Ambulatory Visit: Payer: Self-pay

## 2019-10-22 ENCOUNTER — Encounter (INDEPENDENT_AMBULATORY_CARE_PROVIDER_SITE_OTHER): Payer: Self-pay | Admitting: Ophthalmology

## 2019-10-22 ENCOUNTER — Ambulatory Visit (INDEPENDENT_AMBULATORY_CARE_PROVIDER_SITE_OTHER): Payer: Medicare Other | Admitting: Ophthalmology

## 2019-10-22 DIAGNOSIS — H353221 Exudative age-related macular degeneration, left eye, with active choroidal neovascularization: Secondary | ICD-10-CM | POA: Diagnosis not present

## 2019-10-22 MED ORDER — AFLIBERCEPT 2MG/0.05ML IZ SOLN FOR KALEIDOSCOPE
2.0000 mg | INTRAVITREAL | Status: AC | PRN
  Administered 2019-10-22: 2 mg via INTRAVITREAL

## 2019-10-22 NOTE — Progress Notes (Signed)
10/22/2019     CHIEF COMPLAINT Patient presents for Retina Follow Up   HISTORY OF PRESENT ILLNESS: Adam [name] is a 86 y.o. male who presents to the clinic today for:   HPI    Retina Follow Up    Patient presents with  Wet AMD.  In left eye.  Duration of 8 weeks.  Since onset it is stable.          Comments    8 week follow up - OCT OU, Poss Eylea OS Patient denies change in vision and overall has no complaints.        Last edited by Gerda Diss on 10/22/2019 10:49 AM. (History)      Referring physician: Javier Glazier, MD [place],  [place] [address]  HISTORICAL INFORMATION:   Selected notes from the MEDICAL RECORD NUMBER    Lab Results  Component Value Date   HGBA1C 5.7 (H) 08/03/2019     CURRENT MEDICATIONS: Current Outpatient Medications (Ophthalmic Drugs)  Medication Sig   brimonidine-timolol (COMBIGAN) 0.2-0.5 % ophthalmic solution Place 1 drop into the left eye 2 (two) times daily.    Netarsudil-Latanoprost (ROCKLATAN) 0.02-0.005 % SOLN Place 1 drop into the left eye at bedtime.    No current facility-administered medications for this visit. (Ophthalmic Drugs)   Current Outpatient Medications (Other)  Medication Sig   acetaminophen (TYLENOL) 500 MG tablet Take 500 mg by mouth daily as needed for headache (pain).   amiodarone (PACERONE) 200 MG tablet Take 0.5 tablets (100 mg total) by mouth 2 (two) times daily.   apixaban (ELIQUIS) 2.5 MG TABS tablet Take 1 tablet (2.5 mg total) by mouth 2 (two) times daily.   atorvastatin (LIPITOR) 20 MG tablet Take 1 tablet (20 mg total) by mouth daily.   betamethasone dipropionate (DIPROLENE) 0.05 % ointment Apply 1 application topically daily as needed (itching/rash).    Bioflavonoid Products (ESTER C PO) Take 1 tablet by mouth 2 (two) times daily.   Cholecalciferol (VITAMIN D-3) 125 MCG (5000 UT) TABS Take 5,000 Units by mouth daily.   clobetasol cream (TEMOVATE) 8.75 % Apply 1 application  topically daily as needed (itching/rash).    febuxostat (ULORIC) 40 MG tablet Take 40 mg by mouth See admin instructions. Take one tablet (40 mg) by mouth Tuesday, Friday, Sunday evening   finasteride (PROSCAR) 5 MG tablet Take 5 mg by mouth daily.    furosemide (LASIX) 40 MG tablet Take 2 tablets (80 mg total) by mouth 2 (two) times daily.   hydrALAZINE (APRESOLINE) 100 MG tablet Take 1 tablet (100 mg total) by mouth every 8 (eight) hours.   Multiple Vitamins-Minerals (PRESERVISION AREDS PO) Take 1 capsule by mouth 2 (two) times daily.    omeprazole (PRILOSEC OTC) 20 MG tablet Take 20 mg by mouth daily with lunch.    SODIUM BICARBONATE PO Take 1 capsule by mouth in the morning and at bedtime.   No current facility-administered medications for this visit. (Other)      REVIEW OF SYSTEMS:    ALLERGIES No Known Allergies  PAST MEDICAL HISTORY Past Medical History:  Diagnosis Date   Acute meniscal tear of knee RIGHT KNEE   Arthritis    BPH (benign prostatic hypertrophy)    Colon polyps    hyperplastic   Diabetes mellitus type 2, diet-controlled (HCC) AND EXERCISE-- LAST A1C  5.6   Diverticulosis    Gait abnormality 11/08/2016   GERD (gastroesophageal reflux disease)    Glaucoma  H/O hiatal hernia    Heart murmur    History of esophageal stricture S/P DILATION IN 2007   Hyperlipidemia    Hypertension    IgM monoclonal gammopathy of uncertain significance ASYMPTOMATIC    FOLLOWED BY DR Alen Blew   Internal hemorrhoids    Macrocytic anemia DUE TO CHRONIC RENAL INSUFF.   Nocturia    Persistent atrial fibrillation (HCC)    Right knee pain    Past Surgical History:  Procedure Laterality Date   CATARACT EXTRACTION W/ INTRAOCULAR LENS  IMPLANT, BILATERAL  2012   HEMORRHOID SURGERY     HERNIA REPAIR     KNEE ARTHROSCOPY  12/25/2011   Procedure: ARTHROSCOPY KNEE;  Surgeon: Tobi Bastos, MD;  Location: [facility];  Service:  Orthopedics;  Laterality: Right;  right knee arthroscopy with medial menisectomy      FAMILY HISTORY Family History  Problem Relation Age of Onset   Diabetes Brother    Heart attack Father    Early death Father    Hearing loss Father    Heart disease Father    Alcohol abuse Sister    Cancer Sister     SOCIAL HISTORY Social History   Tobacco Use   Smoking status: Former Smoker    Packs/day: 1.00    Years: 10.00    Pack years: 10.00    Types: Cigarettes   Smokeless tobacco: Never Used  Scientific laboratory technician Use: Never assessed  Substance Use Topics   Alcohol use: Yes    Alcohol/week: 14.0 standard drinks    Types: 14 Standard drinks or equivalent per week   Drug use: No         OPHTHALMIC EXAM:  Base Eye Exam    Visual Acuity (Snellen - Linear)      Right Left   Dist [place] 20/60+2 20/25+1   Dist ph [place] 20/50-2        Tonometry (Tonopen, 10:57 AM)      Right Left   Pressure 12 8       Pupils      Pupils Dark Light Shape React APD   Right PERRL 3 2 Round Slow None   Left PERRL 3 2 Round Slow +2       Visual Fields (Counting fingers)      Left Right     Full   Restrictions Partial outer inferior temporal, inferior nasal deficiencies        Extraocular Movement      Right Left    Full Full       Neuro/Psych    Oriented x3: Yes   Mood/Affect: Normal       Dilation    Left eye: 1.0% Mydriacyl, 2.5% Phenylephrine @ 10:57 AM        Slit Lamp and Fundus Exam    External Exam      Right Left   External Normal Normal       Slit Lamp Exam      Right Left   Lids/Lashes Normal Normal   Conjunctiva/Sclera White and quiet White and quiet   Cornea Clear Clear   Anterior Chamber Deep and quiet Deep and quiet   Iris Round and reactive Round and reactive   Lens Posterior chamber intraocular lens Posterior chamber intraocular lens   Anterior Vitreous Normal Normal       Fundus Exam      Right Left   Posterior Vitreous Normal Posterior  vitreous detachment   Disc  Pallor 1+   C/D Ratio  0.45   Macula  Atrophy, Age related macular degeneration, Early age related macular degeneration, Macular thickening improved   Vessels  Normal   Periphery  Normal          IMAGING AND PROCEDURES  Imaging and Procedures for 10/22/19  OCT, Retina - OU - Both Eyes       Right Eye Quality was good. Scan locations included subfoveal. Central Foveal Thickness: 317. Progression has improved. Findings include no SRF, no IRF, vitreous traction, choroidal neovascular membrane.   Left Eye Quality was good. Scan locations included subfoveal. Central Foveal Thickness: 258. Progression has improved. Findings include no SRF, retinal drusen , subretinal fluid, central retinal atrophy, inner retinal atrophy, outer retinal atrophy.   Notes OS, improved anatomy on Eylea currently at 8 weeks.  We will repeat injection today and extend interval of exam to  10 weeks       Intravitreal Injection, Pharmacologic Agent - OS - Left Eye       Time Out 10/22/2019. 11:35 AM. Confirmed correct patient, procedure, site, and patient consented.   Anesthesia Topical anesthesia was used.   Procedure Preparation included 10% betadine to eyelids, 5% betadine to ocular surface, Ofloxacin . A 30 gauge needle was used.   Injection:  2 mg aflibercept Alfonse Flavors) SOLN   NDC: [id number], Lot: [id number]   Route: Intravitreal, Site: Left Eye, Waste: 0 mg  Post-op Post injection exam found visual acuity of at least counting fingers. The patient tolerated the procedure well. There were no complications. The patient received written and verbal post procedure care education. Post injection medications were not given.                 ASSESSMENT/PLAN:  Exudative age-related macular degeneration of left eye with active choroidal neovascularization (HCC) OS improved anatomy at 8-week examination post intravitreal Eylea, will repeat injection today and extend  exam interval to 10 weeks left eye      ICD-10-CM   1. Exudative age-related macular degeneration of left eye with active choroidal neovascularization (HCC)  H35.3221 OCT, Retina - OU - Both Eyes    Intravitreal Injection, Pharmacologic Agent - OS - Left Eye    aflibercept (EYLEA) SOLN 2 mg    1.  2.  3.  Ophthalmic Meds Ordered this visit:  Meds ordered this encounter  Medications   aflibercept (EYLEA) SOLN 2 mg       Return in about 10 weeks (around 12/31/2019) for OS, dilate, EYLEA OCT.  There are no Patient Instructions on file for this visit.   Explained the diagnoses, plan, and follow up with the patient and they expressed understanding.  Patient expressed understanding of the importance of proper follow up care.   Clent Demark Wilmoth Rasnic M.D. Diseases & Surgery of the Retina and Vitreous Retina & Diabetic [place] 10/22/19     Abbreviations: M myopia (nearsighted); A astigmatism; H hyperopia (farsighted); P presbyopia; Mrx spectacle prescription;  CTL contact lenses; OD right eye; OS left eye; OU both eyes  XT exotropia; ET esotropia; PEK punctate epithelial keratitis; PEE punctate epithelial erosions; DES dry eye syndrome; MGD meibomian gland dysfunction; ATs artificial tears; PFAT's preservative free artificial tears; [place] nuclear sclerotic cataract; PSC posterior subcapsular cataract; ERM epi-retinal membrane; PVD posterior vitreous detachment; RD retinal detachment; DM diabetes mellitus; DR diabetic retinopathy; NPDR non-proliferative diabetic retinopathy; PDR proliferative diabetic retinopathy; CSME clinically significant macular edema; DME diabetic macular edema; dbh dot blot hemorrhages; CWS cotton wool  spot; POAG primary open angle glaucoma; C/D cup-to-disc ratio; HVF humphrey visual field; GVF goldmann visual field; OCT optical coherence tomography; IOP intraocular pressure; BRVO Branch retinal vein occlusion; CRVO central retinal vein occlusion; CRAO central retinal  artery occlusion; BRAO branch retinal artery occlusion; RT retinal tear; SB scleral buckle; PPV pars plana vitrectomy; VH Vitreous hemorrhage; PRP panretinal laser photocoagulation; IVK intravitreal kenalog; VMT vitreomacular traction; MH Macular hole;  NVD neovascularization of the disc; NVE neovascularization elsewhere; AREDS age related eye disease study; ARMD age related macular degeneration; POAG primary open angle glaucoma; EBMD epithelial/anterior basement membrane dystrophy; ACIOL anterior chamber intraocular lens; IOL intraocular lens; PCIOL posterior chamber intraocular lens; Phaco/IOL phacoemulsification with intraocular lens placement; [place] photorefractive keratectomy; LASIK laser assisted in situ keratomileusis; HTN hypertension; DM diabetes mellitus; COPD chronic obstructive pulmonary disease

## 2019-10-22 NOTE — Assessment & Plan Note (Signed)
OS improved anatomy at 8-week examination post intravitreal Eylea, will repeat injection today and extend exam interval to 10 weeks left eye

## 2019-11-09 DIAGNOSIS — I129 Hypertensive chronic kidney disease with stage 1 through stage 4 chronic kidney disease, or unspecified chronic kidney disease: Secondary | ICD-10-CM | POA: Diagnosis not present

## 2019-11-09 DIAGNOSIS — D631 Anemia in chronic kidney disease: Secondary | ICD-10-CM | POA: Diagnosis not present

## 2019-11-09 DIAGNOSIS — N2581 Secondary hyperparathyroidism of renal origin: Secondary | ICD-10-CM | POA: Diagnosis not present

## 2019-11-09 DIAGNOSIS — I4819 Other persistent atrial fibrillation: Secondary | ICD-10-CM | POA: Diagnosis not present

## 2019-11-09 DIAGNOSIS — N184 Chronic kidney disease, stage 4 (severe): Secondary | ICD-10-CM | POA: Diagnosis not present

## 2019-11-12 DIAGNOSIS — M79675 Pain in left toe(s): Secondary | ICD-10-CM | POA: Diagnosis not present

## 2019-11-12 DIAGNOSIS — B351 Tinea unguium: Secondary | ICD-10-CM | POA: Diagnosis not present

## 2019-11-12 DIAGNOSIS — M79674 Pain in right toe(s): Secondary | ICD-10-CM | POA: Diagnosis not present

## 2019-12-02 ENCOUNTER — Encounter (INDEPENDENT_AMBULATORY_CARE_PROVIDER_SITE_OTHER): Payer: Medicare Other | Admitting: Ophthalmology

## 2019-12-09 ENCOUNTER — Encounter (INDEPENDENT_AMBULATORY_CARE_PROVIDER_SITE_OTHER): Payer: Medicare Other | Admitting: Ophthalmology

## 2019-12-14 ENCOUNTER — Encounter (INDEPENDENT_AMBULATORY_CARE_PROVIDER_SITE_OTHER): Payer: Self-pay | Admitting: Ophthalmology

## 2019-12-14 ENCOUNTER — Ambulatory Visit (INDEPENDENT_AMBULATORY_CARE_PROVIDER_SITE_OTHER): Payer: Medicare Other | Admitting: Ophthalmology

## 2019-12-14 ENCOUNTER — Other Ambulatory Visit: Payer: Self-pay

## 2019-12-14 DIAGNOSIS — H353211 Exudative age-related macular degeneration, right eye, with active choroidal neovascularization: Secondary | ICD-10-CM | POA: Diagnosis not present

## 2019-12-14 MED ORDER — BEVACIZUMAB CHEMO INJECTION 1.25MG/0.05ML SYRINGE FOR KALEIDOSCOPE
1.2500 mg | INTRAVITREAL | Status: AC | PRN
  Administered 2019-12-14: 1.25 mg via INTRAVITREAL

## 2019-12-14 NOTE — Assessment & Plan Note (Signed)
Chronically active wet AMD OD, stable at 8 weeks with good acuity some CME may be due to underlying RPE atrophy

## 2019-12-14 NOTE — Patient Instructions (Signed)
Patient to report any new onset visual acuity distortion or decline from either eye

## 2019-12-14 NOTE — Progress Notes (Signed)
12/14/2019     CHIEF COMPLAINT Patient presents for Retina Follow Up   HISTORY OF PRESENT ILLNESS: Adam [name] is a 86 y.o. male who presents to the clinic today for:   HPI    Retina Follow Up    Patient presents with  Wet AMD.  In right eye.  Severity is moderate.  Duration of 9 weeks.  Since onset it is stable.  I, the attending physician,  performed the HPI with the patient and updated documentation appropriately.          Comments    9 Week Wet AMD f\u OD. Possible Avastin OD. OCT  Pt states no changes in vision. Denies any complaints.       Last edited by Tilda Franco on 12/14/2019  1:55 PM. (History)      Referring physician: Javier Glazier, MD [place],  [place] [address]  HISTORICAL INFORMATION:   Selected notes from the MEDICAL RECORD NUMBER    Lab Results  Component Value Date   HGBA1C 5.7 (H) 08/03/2019     CURRENT MEDICATIONS: Current Outpatient Medications (Ophthalmic Drugs)  Medication Sig  . brimonidine-timolol (COMBIGAN) 0.2-0.5 % ophthalmic solution Place 1 drop into the left eye 2 (two) times daily.   . Netarsudil-Latanoprost (ROCKLATAN) 0.02-0.005 % SOLN Place 1 drop into the left eye at bedtime.    No current facility-administered medications for this visit. (Ophthalmic Drugs)   Current Outpatient Medications (Other)  Medication Sig  . acetaminophen (TYLENOL) 500 MG tablet Take 500 mg by mouth daily as needed for headache (pain).  Marland Kitchen amiodarone (PACERONE) 200 MG tablet Take 0.5 tablets (100 mg total) by mouth 2 (two) times daily.  Marland Kitchen apixaban (ELIQUIS) 2.5 MG TABS tablet Take 1 tablet (2.5 mg total) by mouth 2 (two) times daily.  Marland Kitchen atorvastatin (LIPITOR) 20 MG tablet Take 1 tablet (20 mg total) by mouth daily.  . betamethasone dipropionate (DIPROLENE) 0.05 % ointment Apply 1 application topically daily as needed (itching/rash).   Marland Kitchen Bioflavonoid Products (ESTER C PO) Take 1 tablet by mouth 2 (two) times daily.  .  Cholecalciferol (VITAMIN D-3) 125 MCG (5000 UT) TABS Take 5,000 Units by mouth daily.  . clobetasol cream (TEMOVATE) 0.93 % Apply 1 application topically daily as needed (itching/rash).   . febuxostat (ULORIC) 40 MG tablet Take 40 mg by mouth See admin instructions. Take one tablet (40 mg) by mouth Tuesday, Friday, Sunday evening  . finasteride (PROSCAR) 5 MG tablet Take 5 mg by mouth daily.   . furosemide (LASIX) 40 MG tablet Take 2 tablets (80 mg total) by mouth 2 (two) times daily.  . hydrALAZINE (APRESOLINE) 100 MG tablet Take 1 tablet (100 mg total) by mouth every 8 (eight) hours.  . Multiple Vitamins-Minerals (PRESERVISION AREDS PO) Take 1 capsule by mouth 2 (two) times daily.   Marland Kitchen omeprazole (PRILOSEC OTC) 20 MG tablet Take 20 mg by mouth daily with lunch.   . SODIUM BICARBONATE PO Take 1 capsule by mouth in the morning and at bedtime.   No current facility-administered medications for this visit. (Other)      REVIEW OF SYSTEMS:    ALLERGIES No Known Allergies  PAST MEDICAL HISTORY Past Medical History:  Diagnosis Date  . Acute meniscal tear of knee RIGHT KNEE  . Arthritis   . BPH (benign prostatic hypertrophy)   . Colon polyps    hyperplastic  . Diabetes mellitus type 2, diet-controlled (HCC) AND EXERCISE-- LAST A1C  5.6  .  Diverticulosis   . Gait abnormality 11/08/2016  . GERD (gastroesophageal reflux disease)   . Glaucoma   . H/O hiatal hernia   . Heart murmur   . History of esophageal stricture S/P DILATION IN 2007  . Hyperlipidemia   . Hypertension   . IgM monoclonal gammopathy of uncertain significance ASYMPTOMATIC    FOLLOWED BY DR Alen Blew  . Internal hemorrhoids   . Macrocytic anemia DUE TO CHRONIC RENAL INSUFF.  . Nocturia   . Persistent atrial fibrillation ([place])   . Right knee pain    Past Surgical History:  Procedure Laterality Date  . CATARACT EXTRACTION W/ INTRAOCULAR LENS  IMPLANT, BILATERAL  2012  . HEMORRHOID SURGERY    . HERNIA REPAIR    . KNEE  ARTHROSCOPY  12/25/2011   Procedure: ARTHROSCOPY KNEE;  Surgeon: Tobi Bastos, MD;  Location: [facility];  Service: Orthopedics;  Laterality: Right;  right knee arthroscopy with medial menisectomy      FAMILY HISTORY Family History  Problem Relation Age of Onset  . Diabetes Brother   . Heart attack Father   . Early death Father   . Hearing loss Father   . Heart disease Father   . Alcohol abuse Sister   . Cancer Sister     SOCIAL HISTORY Social History   Tobacco Use  . Smoking status: Former Smoker    Packs/day: 1.00    Years: 10.00    Pack years: 10.00    Types: Cigarettes  . Smokeless tobacco: Never Used  Vaping Use  . Vaping Use: Never assessed  Substance Use Topics  . Alcohol use: Yes    Alcohol/week: 14.0 standard drinks    Types: 14 Standard drinks or equivalent per week  . Drug use: No         OPHTHALMIC EXAM:  Base Eye Exam    Visual Acuity (Snellen - Linear)      Right Left   Dist [place] 20/60 20/30   Dist ph [place] 20/50 -2        Tonometry (Tonopen, 2:00 PM)      Right Left   Pressure 14 12       Pupils      Pupils Dark Light Shape React APD   Right PERRL 3 2 Round Slow None   Left PERRL 3 2 Round Slow +2       Visual Fields (Counting fingers)      Left Right     Full   Restrictions Partial outer inferior temporal, inferior nasal deficiencies        Neuro/Psych    Oriented x3: Yes   Mood/Affect: Normal       Dilation    Right eye: 1.0% Mydriacyl, 2.5% Phenylephrine @ 2:00 PM        Slit Lamp and Fundus Exam    External Exam      Right Left   External Normal Normal       Slit Lamp Exam      Right Left   Lids/Lashes Normal Normal   Conjunctiva/Sclera White and quiet White and quiet   Cornea Clear Clear   Anterior Chamber Deep and quiet Deep and quiet   Iris Round and reactive Round and reactive   Lens Posterior chamber intraocular lens Posterior chamber intraocular lens   Anterior Vitreous Normal Normal        Fundus Exam      Right Left   Posterior Vitreous Posterior vitreous detachment  Disc Normal    C/D Ratio 0.2    Macula Retinal pigment epithelial mottling, Subretinal neovascular membrane, no hemorrhage, Retinal pigment epithelial atrophy, Macular atrophy, Early age related macular degeneration, no exudates    Vessels Normal    Periphery Normal           IMAGING AND PROCEDURES  Imaging and Procedures for 12/14/19  OCT, Retina - OU - Both Eyes       Right Eye Quality was good. Scan locations included subfoveal. Central Foveal Thickness: 353. Progression has been stable. Findings include abnormal foveal contour, cystoid macular edema, subretinal scarring, intraretinal fluid.   Left Eye Quality was good. Scan locations included subfoveal. Central Foveal Thickness: 265. Findings include abnormal foveal contour, subretinal scarring.   Notes OD with much less vitreal macular traction yet still persistent with epiretinal membrane.  Wet AMD has improved on intravitreal Avastin, will repeat injection today at 8-week interval examination again in 8 weeks for chronically smoldering disease  OS, less activity of CNVM perifoveal will continue to observe and treat as needed       Intravitreal Injection, Pharmacologic Agent - OD - Right Eye       Time Out 12/14/2019. 2:44 PM. Confirmed correct patient, procedure, site, and patient consented.   Anesthesia Topical anesthesia was used. Anesthetic medications included [place] 3.5%.   Procedure Preparation included 5% betadine to ocular surface, Tobramycin 0.3%, 10% betadine to eyelids. A supplied needle was used.   Injection:  1.25 mg Bevacizumab (AVASTIN) SOLN   NDC: [address]-2, Lot: [id number]   Route: Intravitreal, Site: Right Eye, Waste: 0 mg  Post-op Post injection exam found visual acuity of at least counting fingers. The patient tolerated the procedure well. There were no complications. The patient received written and verbal  post procedure care education. Post injection medications were not given.                 ASSESSMENT/PLAN:  Exudative age-related macular degeneration of right eye with active choroidal neovascularization (HCC) Chronically active wet AMD OD, stable at 8 weeks with good acuity some CME may be due to underlying RPE atrophy      ICD-10-CM   1. Exudative age-related macular degeneration of right eye with active choroidal neovascularization (HCC)  H35.3211 OCT, Retina - OU - Both Eyes    Intravitreal Injection, Pharmacologic Agent - OD - Right Eye    Bevacizumab (AVASTIN) SOLN 1.25 mg    1.  2.  3.  Ophthalmic Meds Ordered this visit:  Meds ordered this encounter  Medications  . Bevacizumab (AVASTIN) SOLN 1.25 mg       Return in about 8 weeks (around 02/08/2020) for dilate, AVASTIN OCT, OD.  Patient Instructions  Patient to report any new onset visual acuity distortion or decline from either eye    Explained the diagnoses, plan, and follow up with the patient and they expressed understanding.  Patient expressed understanding of the importance of proper follow up care.   Clent Demark Sharmane Dame M.D. Diseases & Surgery of the Retina and Vitreous [facility] [place] 12/14/19     Abbreviations: M myopia (nearsighted); A astigmatism; H hyperopia (farsighted); P presbyopia; Mrx spectacle prescription;  CTL contact lenses; OD right eye; OS left eye; OU both eyes  XT exotropia; ET esotropia; PEK punctate epithelial keratitis; PEE punctate epithelial erosions; DES dry eye syndrome; MGD meibomian gland dysfunction; ATs artificial tears; PFAT's preservative free artificial tears; [place] nuclear sclerotic cataract; PSC posterior subcapsular cataract; ERM epi-retinal membrane;  PVD posterior vitreous detachment; RD retinal detachment; DM diabetes mellitus; DR diabetic retinopathy; NPDR non-proliferative diabetic retinopathy; PDR proliferative diabetic retinopathy; CSME clinically  significant macular edema; DME diabetic macular edema; dbh dot blot hemorrhages; CWS cotton wool spot; POAG primary open angle glaucoma; C/D cup-to-disc ratio; HVF humphrey visual field; GVF goldmann visual field; OCT optical coherence tomography; IOP intraocular pressure; BRVO Branch retinal vein occlusion; CRVO central retinal vein occlusion; CRAO central retinal artery occlusion; BRAO branch retinal artery occlusion; RT retinal tear; SB scleral buckle; PPV pars plana vitrectomy; VH Vitreous hemorrhage; PRP panretinal laser photocoagulation; IVK intravitreal kenalog; VMT vitreomacular traction; MH Macular hole;  NVD neovascularization of the disc; NVE neovascularization elsewhere; AREDS age related eye disease study; ARMD age related macular degeneration; POAG primary open angle glaucoma; EBMD epithelial/anterior basement membrane dystrophy; ACIOL anterior chamber intraocular lens; IOL intraocular lens; PCIOL posterior chamber intraocular lens; Phaco/IOL phacoemulsification with intraocular lens placement; [place] photorefractive keratectomy; LASIK laser assisted in situ keratomileusis; HTN hypertension; DM diabetes mellitus; COPD chronic obstructive pulmonary disease

## 2019-12-30 ENCOUNTER — Encounter (INDEPENDENT_AMBULATORY_CARE_PROVIDER_SITE_OTHER): Payer: Medicare Other | Admitting: Ophthalmology

## 2019-12-30 DIAGNOSIS — M5441 Lumbago with sciatica, right side: Secondary | ICD-10-CM | POA: Diagnosis not present

## 2019-12-31 ENCOUNTER — Encounter (INDEPENDENT_AMBULATORY_CARE_PROVIDER_SITE_OTHER): Payer: Medicare Other | Admitting: Ophthalmology

## 2019-12-31 DIAGNOSIS — E559 Vitamin D deficiency, unspecified: Secondary | ICD-10-CM | POA: Diagnosis not present

## 2019-12-31 DIAGNOSIS — N184 Chronic kidney disease, stage 4 (severe): Secondary | ICD-10-CM | POA: Diagnosis not present

## 2019-12-31 DIAGNOSIS — R2689 Other abnormalities of gait and mobility: Secondary | ICD-10-CM | POA: Diagnosis not present

## 2019-12-31 DIAGNOSIS — M6281 Muscle weakness (generalized): Secondary | ICD-10-CM | POA: Diagnosis not present

## 2019-12-31 DIAGNOSIS — I10 Essential (primary) hypertension: Secondary | ICD-10-CM | POA: Diagnosis not present

## 2019-12-31 DIAGNOSIS — E612 Magnesium deficiency: Secondary | ICD-10-CM | POA: Diagnosis not present

## 2019-12-31 DIAGNOSIS — N390 Urinary tract infection, site not specified: Secondary | ICD-10-CM | POA: Diagnosis not present

## 2020-01-04 ENCOUNTER — Encounter (HOSPITAL_BASED_OUTPATIENT_CLINIC_OR_DEPARTMENT_OTHER): Payer: Self-pay | Admitting: *Deleted

## 2020-01-04 ENCOUNTER — Emergency Department (HOSPITAL_BASED_OUTPATIENT_CLINIC_OR_DEPARTMENT_OTHER)
Admission: EM | Admit: 2020-01-04 | Discharge: 2020-01-05 | Disposition: A | Discharge status: Home / Self Care | Payer: Medicare Other | Attending: Emergency Medicine | Admitting: Emergency Medicine

## 2020-01-04 ENCOUNTER — Other Ambulatory Visit: Payer: Self-pay

## 2020-01-04 DIAGNOSIS — I5033 Acute on chronic diastolic (congestive) heart failure: Secondary | ICD-10-CM | POA: Insufficient documentation

## 2020-01-04 DIAGNOSIS — Z87891 Personal history of nicotine dependence: Secondary | ICD-10-CM | POA: Diagnosis not present

## 2020-01-04 DIAGNOSIS — M545 Low back pain, unspecified: Secondary | ICD-10-CM | POA: Diagnosis present

## 2020-01-04 DIAGNOSIS — R000 Tachycardia, unspecified: Secondary | ICD-10-CM | POA: Insufficient documentation

## 2020-01-04 DIAGNOSIS — Z7901 Long term (current) use of anticoagulants: Secondary | ICD-10-CM | POA: Diagnosis not present

## 2020-01-04 DIAGNOSIS — M5431 Sciatica, right side: Secondary | ICD-10-CM | POA: Insufficient documentation

## 2020-01-04 DIAGNOSIS — I110 Hypertensive heart disease with heart failure: Secondary | ICD-10-CM | POA: Insufficient documentation

## 2020-01-04 DIAGNOSIS — I10 Essential (primary) hypertension: Secondary | ICD-10-CM | POA: Diagnosis not present

## 2020-01-04 DIAGNOSIS — M5441 Lumbago with sciatica, right side: Secondary | ICD-10-CM | POA: Diagnosis not present

## 2020-01-04 DIAGNOSIS — E119 Type 2 diabetes mellitus without complications: Secondary | ICD-10-CM | POA: Diagnosis not present

## 2020-01-04 LAB — CBC WITH DIFFERENTIAL/PLATELET
Abs Immature Granulocytes: 0.07 10*3/uL (ref 0.00–0.07)
Basophils Absolute: 0.1 10*3/uL (ref 0.0–0.1)
Basophils Relative: 1 %
Eosinophils Absolute: 0 10*3/uL (ref 0.0–0.5)
Eosinophils Relative: 0 %
HCT: 34.4 % — ABNORMAL LOW (ref 39.0–52.0)
Hemoglobin: 11.5 g/dL — ABNORMAL LOW (ref 13.0–17.0)
Immature Granulocytes: 1 %
Lymphocytes Relative: 7 %
Lymphs Abs: 1 10*3/uL (ref 0.7–4.0)
MCH: 31.6 pg (ref 26.0–34.0)
MCHC: 33.4 g/dL (ref 30.0–36.0)
MCV: 94.5 fL (ref 80.0–100.0)
Monocytes Absolute: 1.9 10*3/uL — ABNORMAL HIGH (ref 0.1–1.0)
Monocytes Relative: 14 %
Neutro Abs: 10.9 10*3/uL — ABNORMAL HIGH (ref 1.7–7.7)
Neutrophils Relative %: 77 %
Platelets: 243 10*3/uL (ref 150–400)
RBC: 3.64 MIL/uL — ABNORMAL LOW (ref 4.22–5.81)
RDW: 14.7 % (ref 11.5–15.5)
WBC: 14 10*3/uL — ABNORMAL HIGH (ref 4.0–10.5)
nRBC: 0 % (ref 0.0–0.2)

## 2020-01-04 LAB — URINALYSIS, ROUTINE W REFLEX MICROSCOPIC
Bilirubin Urine: NEGATIVE
Glucose, UA: NEGATIVE mg/dL
Hgb urine dipstick: NEGATIVE
Ketones, ur: NEGATIVE mg/dL
Leukocytes,Ua: NEGATIVE
Nitrite: NEGATIVE
Protein, ur: NEGATIVE mg/dL
Specific Gravity, Urine: 1.01 (ref 1.005–1.030)
pH: 6 (ref 5.0–8.0)

## 2020-01-04 LAB — BASIC METABOLIC PANEL
Anion gap: 14 (ref 5–15)
BUN: 72 mg/dL — ABNORMAL HIGH (ref 8–23)
CO2: 23 mmol/L (ref 22–32)
Calcium: 8.8 mg/dL — ABNORMAL LOW (ref 8.9–10.3)
Chloride: 97 mmol/L — ABNORMAL LOW (ref 98–111)
Creatinine, Ser: 3.06 mg/dL — ABNORMAL HIGH (ref 0.61–1.24)
GFR calc Af Amer: 20 mL/min — ABNORMAL LOW (ref >60)
GFR calc non Af Amer: 17 mL/min — ABNORMAL LOW (ref >60)
Glucose, Bld: 136 mg/dL — ABNORMAL HIGH (ref 70–99)
Potassium: 3.5 mmol/L (ref 3.5–5.1)
Sodium: 134 mmol/L — ABNORMAL LOW (ref 135–145)

## 2020-01-04 LAB — TROPONIN I (HIGH SENSITIVITY): Troponin I (High Sensitivity): 20 ng/L — ABNORMAL HIGH (ref <18)

## 2020-01-04 MED ORDER — APIXABAN 2.5 MG PO TABS
2.5000 mg | ORAL_TABLET | Freq: Two times a day (BID) | ORAL | Status: DC
  Administered 2020-01-04: 2.5 mg via ORAL
  Filled 2020-01-04: qty 1

## 2020-01-04 MED ORDER — METHYLPREDNISOLONE 4 MG PO TBPK: ORAL_TABLET | ORAL | 0 refills | Status: AC

## 2020-01-04 MED ORDER — DILTIAZEM HCL 25 MG/5ML IV SOLN
20.0000 mg | Freq: Once | INTRAVENOUS | Status: DC
  Filled 2020-01-04: qty 5

## 2020-01-04 MED ORDER — SODIUM CHLORIDE 0.9 % IV BOLUS
500.0000 mL | Freq: Once | INTRAVENOUS | Status: AC
  Administered 2020-01-04: 500 mL via INTRAVENOUS

## 2020-01-04 MED ORDER — TRAMADOL HCL 50 MG PO TABS: 50.0000 mg | ORAL_TABLET | Freq: Four times a day (QID) | ORAL | 0 refills | Status: AC | PRN

## 2020-01-04 MED ORDER — MORPHINE SULFATE (PF) 4 MG/ML IV SOLN
4.0000 mg | Freq: Once | INTRAVENOUS | Status: AC
  Administered 2020-01-04: 4 mg via INTRAVENOUS
  Filled 2020-01-04: qty 1

## 2020-01-04 MED ORDER — HYDRALAZINE HCL 25 MG PO TABS
50.0000 mg | ORAL_TABLET | Freq: Once | ORAL | Status: AC
  Administered 2020-01-04: 50 mg via ORAL
  Filled 2020-01-04: qty 2

## 2020-01-04 NOTE — ED Triage Notes (Addendum)
Back pain for a week. He has been seen by his MD and given pain meds with no relief. His MD called orders for xrays but daughter felt due to pain he should come here instead. He took Gabapentin and Tramadol 2 hours ago with no relief.

## 2020-01-04 NOTE — ED Provider Notes (Signed)
[facility] EMERGENCY DEPARTMENT Provider Note   CSN: [id number] Arrival date & time: 01/04/20  1402     History Chief Complaint  Patient presents with  . Back Pain    Adam [name] is a 86 y.o. male with a history of diabetes, heart murmur, hypertension, hyperlipidemia, persistent A. fib on Eliquis, presenting to emergency department with back pain radiating down his right leg. He reports gradual onset of back pain while sitting on the couch watching TV approximately 1 week ago. He describes a sharp pain in his right flank that radiates down towards his right knee. He denies any falls or trauma preceding this. He says he has never had this kind of pain before. He denies any history of lower back surgery or fractures. He denies any history of sciatica. He says he has had difficulty walking due to pain, and felt like his right leg has "given out" on him several times. He denies any numbness in his legs. He denies weakness in the leg. He denies any dysuria, hematuria, or urinary retention. He denies any saddle anesthesia.  He says the pain is currently 5 out of 10. It is often worse when walking and standing up straight, nothing seems to make it better.    He quit smoking in the 1960s. He denies any personal history of aneurysm, or current abdominal pain.  He denies any chest pain or shortness of breath. He has been compliant with all of his medications.  They followed up with his PCP who prescribed him tramadol as well as gabapentin for suspected nerve pain. He says the tramadol sometimes seems to help, and sometimes does not.  Today it did not help, and his daughter brought him into the ED because the pain was worsening.  Took his meds this morning, due for evening eliquis and hydralazine dose.  HPI     Past Medical History:  Diagnosis Date  . Acute meniscal tear of knee RIGHT KNEE  . Arthritis   . BPH (benign prostatic hypertrophy)   . Colon polyps    hyperplastic  .  Diabetes mellitus type 2, diet-controlled (HCC) AND EXERCISE-- LAST A1C  5.6  . Diverticulosis   . Gait abnormality 11/08/2016  . GERD (gastroesophageal reflux disease)   . Glaucoma   . H/O hiatal hernia   . Heart murmur   . History of esophageal stricture S/P DILATION IN 2007  . Hyperlipidemia   . Hypertension   . IgM monoclonal gammopathy of uncertain significance ASYMPTOMATIC    FOLLOWED BY DR Alen Blew  . Internal hemorrhoids   . Macrocytic anemia DUE TO CHRONIC RENAL INSUFF.  . Nocturia   . Persistent atrial fibrillation ([place])   . Right knee pain     Patient Active Problem List   Diagnosis Date Noted  . Acute on chronic diastolic CHF (congestive heart failure), NYHA class 3 ([place]) 09/04/2019  . Persistent atrial fibrillation ([place])   . Atrial fibrillation with RVR ([place])   . CAP (community acquired pneumonia) 08/02/2019  . Leucocytosis 08/02/2019  . AKI (acute kidney injury) ([place]) 08/02/2019  . Hypoxia 08/02/2019  . Exudative age-related macular degeneration of right eye with active choroidal neovascularization ([place]) 07/09/2019  . Exudative age-related macular degeneration of left eye with active choroidal neovascularization ([place]) 07/09/2019  . Primary open angle glaucoma of left eye, mild stage 07/09/2019  . Advanced nonexudative age-related macular degeneration of right eye without subfoveal involvement 07/09/2019  . Macular pucker, right eye 07/09/2019  . Vitreomacular adhesion of  right eye 07/09/2019  . Posterior vitreous detachment of left eye 07/09/2019  . Confusion 04/30/2018  . Apraxia 04/30/2018  . HTN (hypertension) 04/30/2018  . HLD (hyperlipidemia) 04/30/2018  . Type 2 diabetes mellitus ([place]) 04/30/2018  . BPH (benign prostatic hyperplasia) 04/30/2018  . Degenerative disc disease, lumbar 04/21/2018  . Pronation deformity of ankle, acquired 04/21/2018  . Gait abnormality 11/08/2016  . Dizziness and giddiness 06/21/2016  . Meniscus, lateral, anterior horn  derangement 12/25/2011  . Meniscus, medial, bucket handle tear, old 12/25/2011  . Osteoarthritis of right knee 12/25/2011    Past Surgical History:  Procedure Laterality Date  . CATARACT EXTRACTION W/ INTRAOCULAR LENS  IMPLANT, BILATERAL  2012  . HEMORRHOID SURGERY    . HERNIA REPAIR    . KNEE ARTHROSCOPY  12/25/2011   Procedure: ARTHROSCOPY KNEE;  Surgeon: Tobi Bastos, MD;  Location: [facility];  Service: Orthopedics;  Laterality: Right;  right knee arthroscopy with medial menisectomy         Family History  Problem Relation Age of Onset  . Diabetes Brother   . Heart attack Father   . Early death Father   . Hearing loss Father   . Heart disease Father   . Alcohol abuse Sister   . Cancer Sister     Social History   Tobacco Use  . Smoking status: Former Smoker    Packs/day: 1.00    Years: 10.00    Pack years: 10.00    Types: Cigarettes  . Smokeless tobacco: Never Used  Vaping Use  . Vaping Use: Never assessed  Substance Use Topics  . Alcohol use: Yes    Alcohol/week: 14.0 standard drinks    Types: 14 Standard drinks or equivalent per week  . Drug use: No    Home Medications Prior to Admission medications   Medication Sig Start Date End Date Taking? Authorizing Provider  acetaminophen (TYLENOL) 500 MG tablet Take 500 mg by mouth daily as needed for headache (pain).   Yes [provider]  apixaban (ELIQUIS) 2.5 MG TABS tablet Take 1 tablet (2.5 mg total) by mouth 2 (two) times daily. 08/09/19  Yes Dessa Phi, DO  atorvastatin (LIPITOR) 20 MG tablet Take 1 tablet (20 mg total) by mouth daily. 05/03/18  Yes Fuller Plan A, MD  betamethasone dipropionate (DIPROLENE) 0.05 % ointment Apply 1 application topically daily as needed (itching/rash).  04/27/19  Yes [provider]  Bioflavonoid Products (ESTER C PO) Take 1 tablet by mouth 2 (two) times daily.   Yes [provider]  brimonidine-timolol (COMBIGAN) 0.2-0.5 %  ophthalmic solution Place 1 drop into the left eye 2 (two) times daily.    Yes [provider]  Cholecalciferol (VITAMIN D-3) 125 MCG (5000 UT) TABS Take 5,000 Units by mouth daily.   Yes [provider]  clobetasol cream (TEMOVATE) 1.61 % Apply 1 application topically daily as needed (itching/rash).  05/03/16  Yes [provider]  febuxostat (ULORIC) 40 MG tablet Take 40 mg by mouth See admin instructions. Take one tablet (40 mg) by mouth Tuesday, Friday, Sunday evening   Yes [provider]  finasteride (PROSCAR) 5 MG tablet Take 5 mg by mouth daily.    Yes [provider]  furosemide (LASIX) 40 MG tablet Take 2 tablets (80 mg total) by mouth 2 (two) times daily. 09/08/19  Yes Barrett, Evelene Croon, PA-C  gabapentin (NEURONTIN) 100 MG capsule Take by mouth. 12/30/19  Yes [provider]  Multiple Vitamins-Minerals (PRESERVISION  AREDS PO) Take 1 capsule by mouth 2 (two) times daily.    Yes [provider]  Netarsudil-Latanoprost (ROCKLATAN) 0.02-0.005 % SOLN Place 1 drop into the left eye at bedtime.    Yes [provider]  omeprazole (PRILOSEC OTC) 20 MG tablet Take 20 mg by mouth daily with lunch.  05/13/19  Yes [provider]  SODIUM BICARBONATE PO Take 1 capsule by mouth in the morning and at bedtime.   Yes [provider]  traMADol (ULTRAM) 50 MG tablet Take by mouth. 12/30/19  Yes [provider]  amiodarone (PACERONE) 200 MG tablet Take 0.5 tablets (100 mg total) by mouth 2 (two) times daily. 09/08/19 10/08/19  Barrett, Evelene Croon, PA-C  hydrALAZINE (APRESOLINE) 100 MG tablet Take 1 tablet (100 mg total) by mouth every 8 (eight) hours. 08/09/19 09/08/19  Dessa Phi, DO  methylPREDNISolone (MEDROL DOSEPAK) 4 MG TBPK tablet Use as directed 01/05/20   Wyvonnia Dusky, MD  traMADol (ULTRAM) 50 MG tablet Take 1 tablet (50 mg total) by mouth every 6 (six) hours as needed for up to 12 doses. 01/04/20   Wyvonnia Dusky, MD    Allergies    Patient has no known allergies.  Review of Systems   Review of Systems  Constitutional: Negative for chills and fever.  HENT: Negative for ear pain and sore throat.   Eyes: Negative for pain and visual disturbance.  Respiratory: Negative for cough and shortness of breath.   Cardiovascular: Negative for chest pain and palpitations.  Gastrointestinal: Negative for abdominal pain and vomiting.  Genitourinary: Negative for difficulty urinating, dysuria and hematuria.  Musculoskeletal: Positive for arthralgias and back pain.  Skin: Negative for color change and rash.  Neurological: Negative for syncope, weakness, light-headedness and headaches.  Psychiatric/Behavioral: Negative for agitation and confusion.  All other systems reviewed and are negative.   Physical Exam Updated Vital Signs BP (!) 168/72   Pulse 83   Temp 98.6 F (37 C) (Oral)   Resp 10   Ht 5\' 4"  (1.626 m)   Wt 66.9 kg   SpO2 98%   BMI 25.32 kg/m   Physical Exam Vitals and nursing note reviewed.  Constitutional:      Appearance: He is well-developed.  HENT:     Head: Normocephalic and atraumatic.  Eyes:     Conjunctiva/sclera: Conjunctivae normal.  Cardiovascular:     Rate and Rhythm: Tachycardia present. Rhythm irregular.     Pulses: Normal pulses.     Comments: A Fib with RVR, HR 120-140 bpm on telemetry Pulmonary:     Effort: Pulmonary effort is normal. No respiratory distress.     Breath sounds: Normal breath sounds.  Abdominal:     Palpations: Abdomen is soft.     Tenderness: There is no abdominal tenderness. There is no right CVA tenderness or left CVA tenderness.     Comments: No pulsatile abdominal mass  Musculoskeletal:     Cervical back: Neck supple.  Skin:    General: Skin is warm and dry.  Neurological:     Mental Status: He is alert.     Comments: Positive straight leg test with left leg raise No saddle anesthesia No sensory deficits Equal and normal strength  of the lower extremities  Psychiatric:        Mood and Affect: Mood normal.        Behavior: Behavior normal.     ED Results / Procedures / Treatments   Labs (all labs ordered  are listed, but only abnormal results are displayed) Labs Reviewed  BASIC METABOLIC PANEL - Abnormal; Notable for the following components:      Result Value   Sodium 134 (*)    Chloride 97 (*)    Glucose, Bld 136 (*)    BUN 72 (*)    Creatinine, Ser 3.06 (*)    Calcium 8.8 (*)    GFR calc non Af Amer 17 (*)    GFR calc Af Amer 20 (*)    All other components within normal limits  CBC WITH DIFFERENTIAL/PLATELET - Abnormal; Notable for the following components:   WBC 14.0 (*)    RBC 3.64 (*)    Hemoglobin 11.5 (*)    HCT 34.4 (*)    Neutro Abs 10.9 (*)    Monocytes Absolute 1.9 (*)    All other components within normal limits  TROPONIN I (HIGH SENSITIVITY) - Abnormal; Notable for the following components:   Troponin I (High Sensitivity) 20 (*)    All other components within normal limits  URINALYSIS, ROUTINE W REFLEX MICROSCOPIC  TROPONIN I (HIGH SENSITIVITY)    EKG EKG Interpretation  Date/Time:  Monday January 04 2020 20:12:32 EDT Ventricular Rate:  132 PR Interval:    QRS Duration: 160 QT Interval:  377 QTC Calculation: 559 R Axis:   -128 Text Interpretation: A Fib with RVR, no STEMI Confirmed by Octaviano Glow [phone]) on 01/05/2020 12:03:00 AM   Radiology No results found.  Procedures Procedures (including critical care time)  Medications Ordered in ED Medications  diltiazem (CARDIZEM) injection 20 mg (has no administration in time range)  apixaban (ELIQUIS) tablet 2.5 mg (2.5 mg Oral Given 01/04/20 2217)  sodium chloride 0.9 % bolus 500 mL (0 mLs Intravenous Stopped 01/04/20 2305)  morphine 4 MG/ML injection 4 mg (4 mg Intravenous Given 01/04/20 2216)  hydrALAZINE (APRESOLINE) tablet 50 mg (50 mg Oral Given 01/04/20 2217)    ED Course  I have reviewed the triage vital signs and  the nursing notes.  Pertinent labs & imaging results that were available during my care of the patient were reviewed by me and considered in my medical decision making (see chart for details).  86 yo male presenting to the ED with right sided lower back pain radiating down into his leg.  This is most consistent with radiculopathy or sciatica, particularly given his reproducibility with contralateral straight leg test.  I doubt spinal fx with no trauma, no midline ttp.    He has no objective weakness or saddle anesthesia to suggest cauda equina syndrome.  PVR was 125 cc per nursing.  Originally he presented in A Fib with RVR, as noted on his ECG.  He had no active CP or palpitations with this.  He has a hx of persistent A Fib.  Diltiazem was ordered but not given as his HR spontaneously improved to 90 bpm on its own.    Given his clinical exam, I had a lower suspicion at this time for AAA, aortic dissection, pyelonephritis, or acute intraabdominal infection or perforation.  UA showed no sign of infection or blood to suggest kidney stone.    He was given his PO evening meds and IV morphine with 500 cc IVF bolus, with significant improvement of his pain.  Medrol dose pack provided to supplement his home medications.  See ED course below.   Clinical Course as of Jan 05 128  Mon Jan 04, 2020  2256 HR now 90 bpm and irregular without receiving  diltiazem   [MT]  Tue Jan 05, 2020  0003 Patient reporting that his back pain is significantly improved after morphine.  Again I reviewed the differential with him and his daughter.  I suspect that this is likely sciatica, particularly given his positive straight leg test on the left side, which has a fairly high specificity for this condition.  He does have a minor leukocytosis here without any fever or any other clear infectious symptoms.  His urinalysis did not show signs of infection.  Have a low clinical suspicion for pyelonephritis at this time.  He also  has no abdominal tenderness whatsoever, and have a low suspicion for acute diverticulitis or colitis.  I think, given that his pain is controlled, be reasonable to discharge him home at this time with home PT evaluation.  I will have case management help set this up.  I also advised that his daughter contact his PCP and discuss getting MRI of the L-spine to look for herniated disc.  She said they would contact him tomorrow.   [MT]    Clinical Course User Index [MT] Herndon Grill, Carola Rhine, MD    Final Clinical Impression(s) / ED Diagnoses Final diagnoses:  Acute right-sided low back pain with right-sided sciatica    Rx / DC Orders ED Discharge Orders         Ordered    methylPREDNISolone (MEDROL DOSEPAK) 4 MG TBPK tablet        01/04/20 2346    traMADol (ULTRAM) 50 MG tablet  Every 6 hours PRN        01/04/20 2346           Wyvonnia Dusky, MD 01/05/20 0130

## 2020-01-04 NOTE — Discharge Instructions (Addendum)
Please call your primary care provider tomorrow to ask about scheduling an MRI of the lumbar spine (without contrast) to look for signs of herniated disc or nerve problems.  If your pain becomes unbearable again, or you begin having numbness around your groin, or difficulty urinating, please come back to the ER.  These may be signs of worsening nerve injury or another medical emergency.  For pain you can take tramadol 50 mg every 6 hours, tylenol 650 mg every 6 hours, and also prednisone (steroids) as directed on the package for the next 5 days.  Always use your walker when walking.  Get up slowly.  Be careful not to fall.  Our case manager should reach out to you in the next 1-2 days about setting up a home physical therapy evaluation and home health aide.

## 2020-01-05 ENCOUNTER — Telehealth: Payer: Self-pay | Admitting: Internal Medicine

## 2020-01-05 DIAGNOSIS — M5136 Other intervertebral disc degeneration, lumbar region: Secondary | ICD-10-CM

## 2020-01-05 NOTE — Addendum Note (Signed)
Addended by: Antonieta Iba on: 01/05/2020 08:58 AM   Modules accepted: Orders

## 2020-01-05 NOTE — TOC Initial Note (Addendum)
01/05/2020 1:03 pm   Transition of Care [facility]) - Initial/Assessment Note    Patient Details  Name: Adam [name] MRN: [id number] Date of Birth: 25-Feb-1931  [facility]) CM/SW Contact:    Erenest Rasher, RN Phone Number:  [phone] 01/05/2020, 12:57 PM  Clinical Narrative:                 Attempted call to pt, and left HIPAA compliant message for return call. Contacted pt's dtr, TOC CM spoke to dtr, [employer]. States pt had [place] a month ago. She is wanting [facility] for [facility]. 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 States he lives at [place] home. Contacted Elizebeth Koller, [place] with new referral. States they have limited aide but will assist with getting him set up with [place] aide services. ED provider updated.   Expected Discharge Plan: [place] Barriers to Discharge: No Barriers Identified   Patient Goals and CMS Choice Patient states their goals for this hospitalization and ongoing recovery are:: prefers to receive Home Health PT in home CMS Medicare.gov Compare Post Acute Care list provided to:: Patient Represenative (must comment) [name]) Choice offered to / list presented to : Adult Children  Expected Discharge Plan and Services Expected Discharge Plan: [place] In-house Referral: [facility] Discharge Planning Services: CM Consult Post Acute Care Choice: [place] arrangements for the past 2 months: [place] ([place])                           [place] Arranged: RN, PT, OT, Nurse's Aide [place] Agency: [place] Date [facility] Agency Contacted: 01/05/20 Time [place]: [phone] Representative spoke with at [place]: Adela Lank  Prior Living Arrangements/Services Living arrangements for the past 2 months: [place] Retail banker) Lives with:: Self Patient language and need for interpreter reviewed:: Yes Do you feel safe going back to the  place where you live?: Yes      Need for Family Participation in Patient Care: Yes (Comment) Care giver support system in place?: Yes (comment) Current home services: DME (transport chair, rolling walker, rollator) Criminal Activity/Legal Involvement Pertinent to Current Situation/Hospitalization: No - Comment as needed  Activities of Daily Living      Permission Sought/Granted Permission sought to share information with : Case Manager, PCP, Family Supports Permission granted to share information with : Yes, Verbal Permission Granted  Share Information with NAME: [name]  Permission granted to share info w AGENCY: [place] granted to share info w Relationship: daughter  Permission granted to share info w Contact Information: [phone]  Emotional Assessment       Orientation: : Oriented to Self, Oriented to Place, Oriented to  Time, Oriented to Situation   Psych Involvement: No (comment)  Admission diagnosis:  BACK PAIN Patient Active Problem List   Diagnosis Date Noted  . Acute on chronic diastolic CHF (congestive heart failure), NYHA class 3 ([place]) 09/04/2019  . Persistent atrial fibrillation ([place])   . Atrial fibrillation with RVR ([place])   . CAP (community acquired pneumonia) 08/02/2019  . Leucocytosis 08/02/2019  . AKI (acute kidney injury) ([place]) 08/02/2019  . Hypoxia 08/02/2019  . Exudative age-related macular degeneration of right eye with active choroidal neovascularization ([place]) 07/09/2019  . Exudative age-related macular degeneration of left eye with active choroidal neovascularization ([place]) 07/09/2019  . Primary open angle  glaucoma of left eye, mild stage 07/09/2019  . Advanced nonexudative age-related macular degeneration of right eye without subfoveal involvement 07/09/2019  . Macular pucker, right eye 07/09/2019  . Vitreomacular adhesion of right eye 07/09/2019  . Posterior vitreous detachment of left eye 07/09/2019  . Confusion 04/30/2018  .  Apraxia 04/30/2018  . HTN (hypertension) 04/30/2018  . HLD (hyperlipidemia) 04/30/2018  . Type 2 diabetes mellitus ([place]) 04/30/2018  . BPH (benign prostatic hyperplasia) 04/30/2018  . Degenerative disc disease, lumbar 04/21/2018  . Pronation deformity of ankle, acquired 04/21/2018  . Gait abnormality 11/08/2016  . Dizziness and giddiness 06/21/2016  . Meniscus, lateral, anterior horn derangement 12/25/2011  . Meniscus, medial, bucket handle tear, old 12/25/2011  . Osteoarthritis of right knee 12/25/2011   PCP:  Javier Glazier, MD Pharmacy:   [facility] [address] [place] [address] Phone: [phone] Fax: [phone]  [facility] [place] [place] [address] Phone: [phone] Fax: [phone]     Social Determinants of Health (SDOH) Interventions    Readmission Risk Interventions No flowsheet data found.

## 2020-01-05 NOTE — Telephone Encounter (Signed)
MRI of L-spine has been ordered.

## 2020-01-05 NOTE — Telephone Encounter (Signed)
I have followed pt in clinic and hospital Received call from daughter yesterday that pt with significant back pain.   Has fallen, legs give out. Seen at [facility] yesterday in ED Recomm MRI of L spine to eval for disc problem Please order

## 2020-01-06 DIAGNOSIS — E785 Hyperlipidemia, unspecified: Secondary | ICD-10-CM | POA: Diagnosis not present

## 2020-01-06 DIAGNOSIS — I4819 Other persistent atrial fibrillation: Secondary | ICD-10-CM | POA: Diagnosis not present

## 2020-01-06 DIAGNOSIS — D509 Iron deficiency anemia, unspecified: Secondary | ICD-10-CM | POA: Diagnosis not present

## 2020-01-06 DIAGNOSIS — I10 Essential (primary) hypertension: Secondary | ICD-10-CM | POA: Diagnosis not present

## 2020-01-06 DIAGNOSIS — K5790 Diverticulosis of intestine, part unspecified, without perforation or abscess without bleeding: Secondary | ICD-10-CM | POA: Diagnosis not present

## 2020-01-06 DIAGNOSIS — I083 Combined rheumatic disorders of mitral, aortic and tricuspid valves: Secondary | ICD-10-CM | POA: Diagnosis not present

## 2020-01-06 DIAGNOSIS — H35371 Puckering of macula, right eye: Secondary | ICD-10-CM | POA: Diagnosis not present

## 2020-01-06 DIAGNOSIS — I700 Atherosclerosis of aorta: Secondary | ICD-10-CM | POA: Diagnosis not present

## 2020-01-06 DIAGNOSIS — D630 Anemia in neoplastic disease: Secondary | ICD-10-CM | POA: Diagnosis not present

## 2020-01-06 DIAGNOSIS — I2510 Atherosclerotic heart disease of native coronary artery without angina pectoris: Secondary | ICD-10-CM | POA: Diagnosis not present

## 2020-01-06 DIAGNOSIS — H353231 Exudative age-related macular degeneration, bilateral, with active choroidal neovascularization: Secondary | ICD-10-CM | POA: Diagnosis not present

## 2020-01-06 DIAGNOSIS — M170 Bilateral primary osteoarthritis of knee: Secondary | ICD-10-CM | POA: Diagnosis not present

## 2020-01-06 DIAGNOSIS — I130 Hypertensive heart and chronic kidney disease with heart failure and stage 1 through stage 4 chronic kidney disease, or unspecified chronic kidney disease: Secondary | ICD-10-CM | POA: Diagnosis not present

## 2020-01-06 DIAGNOSIS — I5033 Acute on chronic diastolic (congestive) heart failure: Secondary | ICD-10-CM | POA: Diagnosis not present

## 2020-01-06 DIAGNOSIS — N401 Enlarged prostate with lower urinary tract symptoms: Secondary | ICD-10-CM | POA: Diagnosis not present

## 2020-01-06 DIAGNOSIS — M1909 Primary osteoarthritis, other specified site: Secondary | ICD-10-CM | POA: Diagnosis not present

## 2020-01-06 DIAGNOSIS — I0981 Rheumatic heart failure: Secondary | ICD-10-CM | POA: Diagnosis not present

## 2020-01-06 DIAGNOSIS — D539 Nutritional anemia, unspecified: Secondary | ICD-10-CM | POA: Diagnosis not present

## 2020-01-06 DIAGNOSIS — D631 Anemia in chronic kidney disease: Secondary | ICD-10-CM | POA: Diagnosis not present

## 2020-01-06 DIAGNOSIS — M479 Spondylosis, unspecified: Secondary | ICD-10-CM | POA: Diagnosis not present

## 2020-01-06 DIAGNOSIS — M5116 Intervertebral disc disorders with radiculopathy, lumbar region: Secondary | ICD-10-CM | POA: Diagnosis not present

## 2020-01-06 DIAGNOSIS — E1122 Type 2 diabetes mellitus with diabetic chronic kidney disease: Secondary | ICD-10-CM | POA: Diagnosis not present

## 2020-01-06 DIAGNOSIS — H409 Unspecified glaucoma: Secondary | ICD-10-CM | POA: Diagnosis not present

## 2020-01-06 DIAGNOSIS — R351 Nocturia: Secondary | ICD-10-CM | POA: Diagnosis not present

## 2020-01-06 DIAGNOSIS — N184 Chronic kidney disease, stage 4 (severe): Secondary | ICD-10-CM | POA: Diagnosis not present

## 2020-01-06 DIAGNOSIS — D472 Monoclonal gammopathy: Secondary | ICD-10-CM | POA: Diagnosis not present

## 2020-01-08 DIAGNOSIS — M170 Bilateral primary osteoarthritis of knee: Secondary | ICD-10-CM | POA: Diagnosis not present

## 2020-01-08 DIAGNOSIS — M5116 Intervertebral disc disorders with radiculopathy, lumbar region: Secondary | ICD-10-CM | POA: Diagnosis not present

## 2020-01-08 DIAGNOSIS — M479 Spondylosis, unspecified: Secondary | ICD-10-CM | POA: Diagnosis not present

## 2020-01-08 DIAGNOSIS — I4819 Other persistent atrial fibrillation: Secondary | ICD-10-CM | POA: Diagnosis not present

## 2020-01-08 DIAGNOSIS — M1909 Primary osteoarthritis, other specified site: Secondary | ICD-10-CM | POA: Diagnosis not present

## 2020-01-08 DIAGNOSIS — I2510 Atherosclerotic heart disease of native coronary artery without angina pectoris: Secondary | ICD-10-CM | POA: Diagnosis not present

## 2020-01-11 DIAGNOSIS — N184 Chronic kidney disease, stage 4 (severe): Secondary | ICD-10-CM | POA: Diagnosis not present

## 2020-01-11 DIAGNOSIS — I129 Hypertensive chronic kidney disease with stage 1 through stage 4 chronic kidney disease, or unspecified chronic kidney disease: Secondary | ICD-10-CM | POA: Diagnosis not present

## 2020-01-11 DIAGNOSIS — N2581 Secondary hyperparathyroidism of renal origin: Secondary | ICD-10-CM | POA: Diagnosis not present

## 2020-01-11 DIAGNOSIS — D631 Anemia in chronic kidney disease: Secondary | ICD-10-CM | POA: Diagnosis not present

## 2020-01-13 DIAGNOSIS — B351 Tinea unguium: Secondary | ICD-10-CM | POA: Diagnosis not present

## 2020-01-13 DIAGNOSIS — M170 Bilateral primary osteoarthritis of knee: Secondary | ICD-10-CM | POA: Diagnosis not present

## 2020-01-13 DIAGNOSIS — M479 Spondylosis, unspecified: Secondary | ICD-10-CM | POA: Diagnosis not present

## 2020-01-13 DIAGNOSIS — M79675 Pain in left toe(s): Secondary | ICD-10-CM | POA: Diagnosis not present

## 2020-01-13 DIAGNOSIS — M79674 Pain in right toe(s): Secondary | ICD-10-CM | POA: Diagnosis not present

## 2020-01-13 DIAGNOSIS — M5116 Intervertebral disc disorders with radiculopathy, lumbar region: Secondary | ICD-10-CM | POA: Diagnosis not present

## 2020-01-13 DIAGNOSIS — M1909 Primary osteoarthritis, other specified site: Secondary | ICD-10-CM | POA: Diagnosis not present

## 2020-01-13 DIAGNOSIS — I4819 Other persistent atrial fibrillation: Secondary | ICD-10-CM | POA: Diagnosis not present

## 2020-01-13 DIAGNOSIS — I2510 Atherosclerotic heart disease of native coronary artery without angina pectoris: Secondary | ICD-10-CM | POA: Diagnosis not present

## 2020-01-14 DIAGNOSIS — M5116 Intervertebral disc disorders with radiculopathy, lumbar region: Secondary | ICD-10-CM | POA: Diagnosis not present

## 2020-01-14 DIAGNOSIS — I4819 Other persistent atrial fibrillation: Secondary | ICD-10-CM | POA: Diagnosis not present

## 2020-01-14 DIAGNOSIS — M1909 Primary osteoarthritis, other specified site: Secondary | ICD-10-CM | POA: Diagnosis not present

## 2020-01-14 DIAGNOSIS — I2510 Atherosclerotic heart disease of native coronary artery without angina pectoris: Secondary | ICD-10-CM | POA: Diagnosis not present

## 2020-01-14 DIAGNOSIS — M170 Bilateral primary osteoarthritis of knee: Secondary | ICD-10-CM | POA: Diagnosis not present

## 2020-01-14 DIAGNOSIS — M479 Spondylosis, unspecified: Secondary | ICD-10-CM | POA: Diagnosis not present

## 2020-01-15 ENCOUNTER — Other Ambulatory Visit: Payer: Self-pay

## 2020-01-15 ENCOUNTER — Ambulatory Visit (HOSPITAL_COMMUNITY)
Admission: RE | Admit: 2020-01-15 | Discharge: 2020-01-15 | Disposition: A | Discharge status: Home / Self Care | Payer: Medicare Other | Source: Ambulatory Visit | Attending: Internal Medicine | Admitting: Internal Medicine

## 2020-01-15 DIAGNOSIS — M5136 Other intervertebral disc degeneration, lumbar region: Secondary | ICD-10-CM | POA: Diagnosis not present

## 2020-01-15 DIAGNOSIS — I2510 Atherosclerotic heart disease of native coronary artery without angina pectoris: Secondary | ICD-10-CM | POA: Diagnosis not present

## 2020-01-15 DIAGNOSIS — M5116 Intervertebral disc disorders with radiculopathy, lumbar region: Secondary | ICD-10-CM | POA: Diagnosis not present

## 2020-01-15 DIAGNOSIS — M545 Low back pain, unspecified: Secondary | ICD-10-CM | POA: Diagnosis not present

## 2020-01-15 DIAGNOSIS — I4819 Other persistent atrial fibrillation: Secondary | ICD-10-CM | POA: Diagnosis not present

## 2020-01-15 DIAGNOSIS — M170 Bilateral primary osteoarthritis of knee: Secondary | ICD-10-CM | POA: Diagnosis not present

## 2020-01-15 DIAGNOSIS — M479 Spondylosis, unspecified: Secondary | ICD-10-CM | POA: Diagnosis not present

## 2020-01-15 DIAGNOSIS — M1909 Primary osteoarthritis, other specified site: Secondary | ICD-10-CM | POA: Diagnosis not present

## 2020-01-15 MED ORDER — GADOBUTROL 1 MMOL/ML IV SOLN
5.0000 mL | Freq: Once | INTRAVENOUS | Status: AC | PRN
  Administered 2020-01-15: 5 mL via INTRAVENOUS

## 2020-01-18 DIAGNOSIS — M48061 Spinal stenosis, lumbar region without neurogenic claudication: Secondary | ICD-10-CM | POA: Diagnosis not present

## 2020-01-18 DIAGNOSIS — M48062 Spinal stenosis, lumbar region with neurogenic claudication: Secondary | ICD-10-CM | POA: Diagnosis not present

## 2020-01-18 DIAGNOSIS — I10 Essential (primary) hypertension: Secondary | ICD-10-CM | POA: Diagnosis not present

## 2020-01-19 ENCOUNTER — Telehealth: Payer: Self-pay

## 2020-01-19 DIAGNOSIS — M479 Spondylosis, unspecified: Secondary | ICD-10-CM | POA: Diagnosis not present

## 2020-01-19 DIAGNOSIS — I4819 Other persistent atrial fibrillation: Secondary | ICD-10-CM | POA: Diagnosis not present

## 2020-01-19 DIAGNOSIS — I2510 Atherosclerotic heart disease of native coronary artery without angina pectoris: Secondary | ICD-10-CM | POA: Diagnosis not present

## 2020-01-19 DIAGNOSIS — M170 Bilateral primary osteoarthritis of knee: Secondary | ICD-10-CM | POA: Diagnosis not present

## 2020-01-19 DIAGNOSIS — M1909 Primary osteoarthritis, other specified site: Secondary | ICD-10-CM | POA: Diagnosis not present

## 2020-01-19 DIAGNOSIS — M5116 Intervertebral disc disorders with radiculopathy, lumbar region: Secondary | ICD-10-CM | POA: Diagnosis not present

## 2020-01-19 NOTE — Telephone Encounter (Signed)
   [facility] Pre-operative Risk Assessment    HEARTCARE STAFF: - Please ensure there is not already an duplicate clearance open for this procedure. - Under Visit Info/Reason for Call, type in Other and utilize the format Clearance MM/DD/YY or Clearance TBD. Do not use dashes or single digits. - If request is for dental extraction, please clarify the # of teeth to be extracted.  Request for surgical clearance:  1. What type of surgery is being performed?  Posterior lumbar interbody fusion and Epidural steroid injection   2. When is this surgery scheduled? TBD   3. What type of clearance is required (medical clearance vs. Pharmacy clearance to hold med vs. Both)? Both   4. Are there any medications that need to be held prior to surgery and how long? Eliquis   5. Practice name and name of physician performing surgery? [facility], Dr. Ashok Pall   6. What is the office phone number? [phone]   7.   What is the office fax number? [phone]  8.   Anesthesia type (None, local, MAC, general) ? General- anesthesia surgery/ IV Sed or none-injection    Mendel Ryder 01/19/2020, 2:54 PM  _________________________________________________________________   (provider comments below)

## 2020-01-19 NOTE — Telephone Encounter (Signed)
   Primary Cardiologist: Dorris Carnes, MD  Chart reviewed as part of pre-operative protocol coverage. Because of Adam [name]'s past medical history and time since last visit, he will require a follow-up visit in order to better assess preoperative cardiovascular risk.  Pre-op covering staff: - Please schedule appointment and call patient to inform them. If patient already had an upcoming appointment within acceptable timeframe, please add "pre-op clearance" to the appointment notes so provider is aware. - Please contact requesting surgeon's office via preferred method (i.e, phone, fax) to inform them of need for appointment prior to surgery.  If applicable, this message will also be routed to pharmacy pool and/or primary cardiologist for input on holding anticoagulant/antiplatelet agent as requested below so that this information is available to the clearing provider at time of patient's appointment.   Pt never had follow up after last hospitalization.  Tami Lin Carrie Usery, PA  01/19/2020, 3:27 PM

## 2020-01-19 NOTE — Telephone Encounter (Signed)
Lvm for patient to call back to schedule appointment for preop clearance

## 2020-01-20 ENCOUNTER — Other Ambulatory Visit: Payer: Self-pay

## 2020-01-20 ENCOUNTER — Encounter (INDEPENDENT_AMBULATORY_CARE_PROVIDER_SITE_OTHER): Payer: Self-pay | Admitting: Ophthalmology

## 2020-01-20 ENCOUNTER — Ambulatory Visit (INDEPENDENT_AMBULATORY_CARE_PROVIDER_SITE_OTHER): Payer: Medicare Other | Admitting: Ophthalmology

## 2020-01-20 DIAGNOSIS — H353221 Exudative age-related macular degeneration, left eye, with active choroidal neovascularization: Secondary | ICD-10-CM

## 2020-01-20 DIAGNOSIS — E119 Type 2 diabetes mellitus without complications: Secondary | ICD-10-CM

## 2020-01-20 MED ORDER — AFLIBERCEPT 2MG/0.05ML IZ SOLN FOR KALEIDOSCOPE
2.0000 mg | INTRAVITREAL | Status: AC | PRN
  Administered 2020-01-20: 2 mg via INTRAVITREAL

## 2020-01-20 NOTE — Progress Notes (Signed)
01/20/2020     CHIEF COMPLAINT Patient presents for Retina Follow Up   HISTORY OF PRESENT ILLNESS: Adam [name] is a 86 y.o. male who presents to the clinic today for:   HPI    Retina Follow Up    Patient presents with  Wet AMD.  In left eye.  This started 13 weeks ago.  Severity is mild.  Duration of 13 weeks.  Since onset it is stable.          Comments    13 Week AMD F/U OS, poss Eylea OS  Pt c/o difficulty reading OU. Pt denies any other new symptoms OU. Pt is using eye drops as prescribed OS.       Last edited by Rockie Neighbours, [place] on 01/20/2020  2:31 PM. (History)      Referring physician: Javier Glazier, MD [place],  [place] [address]  HISTORICAL INFORMATION:   Selected notes from the MEDICAL RECORD NUMBER    Lab Results  Component Value Date   HGBA1C 5.7 (H) 08/03/2019     CURRENT MEDICATIONS: Current Outpatient Medications (Ophthalmic Drugs)  Medication Sig  . brimonidine-timolol (COMBIGAN) 0.2-0.5 % ophthalmic solution Place 1 drop into the left eye 2 (two) times daily.   . Netarsudil-Latanoprost (ROCKLATAN) 0.02-0.005 % SOLN Place 1 drop into the left eye at bedtime.    No current facility-administered medications for this visit. (Ophthalmic Drugs)   Current Outpatient Medications (Other)  Medication Sig  . acetaminophen (TYLENOL) 500 MG tablet Take 500 mg by mouth daily as needed for headache (pain).  Marland Kitchen amiodarone (PACERONE) 200 MG tablet Take 0.5 tablets (100 mg total) by mouth 2 (two) times daily.  Marland Kitchen apixaban (ELIQUIS) 2.5 MG TABS tablet Take 1 tablet (2.5 mg total) by mouth 2 (two) times daily.  Marland Kitchen atorvastatin (LIPITOR) 20 MG tablet Take 1 tablet (20 mg total) by mouth daily.  . betamethasone dipropionate (DIPROLENE) 0.05 % ointment Apply 1 application topically daily as needed (itching/rash).   Marland Kitchen Bioflavonoid Products (ESTER C PO) Take 1 tablet by mouth 2 (two) times daily.  . Cholecalciferol (VITAMIN D-3) 125 MCG (5000 UT) TABS  Take 5,000 Units by mouth daily.  . clobetasol cream (TEMOVATE) 2.95 % Apply 1 application topically daily as needed (itching/rash).   . febuxostat (ULORIC) 40 MG tablet Take 40 mg by mouth See admin instructions. Take one tablet (40 mg) by mouth Tuesday, Friday, Sunday evening  . finasteride (PROSCAR) 5 MG tablet Take 5 mg by mouth daily.   . furosemide (LASIX) 40 MG tablet Take 2 tablets (80 mg total) by mouth 2 (two) times daily.  Marland Kitchen gabapentin (NEURONTIN) 100 MG capsule Take by mouth.  . hydrALAZINE (APRESOLINE) 100 MG tablet Take 1 tablet (100 mg total) by mouth every 8 (eight) hours.  . methylPREDNISolone (MEDROL DOSEPAK) 4 MG TBPK tablet Use as directed  . Multiple Vitamins-Minerals (PRESERVISION AREDS PO) Take 1 capsule by mouth 2 (two) times daily.   Marland Kitchen omeprazole (PRILOSEC OTC) 20 MG tablet Take 20 mg by mouth daily with lunch.   . SODIUM BICARBONATE PO Take 1 capsule by mouth in the morning and at bedtime.  . traMADol (ULTRAM) 50 MG tablet Take by mouth.  . traMADol (ULTRAM) 50 MG tablet Take 1 tablet (50 mg total) by mouth every 6 (six) hours as needed for up to 12 doses.   No current facility-administered medications for this visit. (Other)      REVIEW OF SYSTEMS:  ALLERGIES No Known Allergies  PAST MEDICAL HISTORY Past Medical History:  Diagnosis Date  . Acute meniscal tear of knee RIGHT KNEE  . Arthritis   . BPH (benign prostatic hypertrophy)   . Colon polyps    hyperplastic  . Diabetes mellitus type 2, diet-controlled (HCC) AND EXERCISE-- LAST A1C  5.6  . Diverticulosis   . Gait abnormality 11/08/2016  . GERD (gastroesophageal reflux disease)   . Glaucoma   . H/O hiatal hernia   . Heart murmur   . History of esophageal stricture S/P DILATION IN 2007  . Hyperlipidemia   . Hypertension   . IgM monoclonal gammopathy of uncertain significance ASYMPTOMATIC    FOLLOWED BY DR Alen Blew  . Internal hemorrhoids   . Macrocytic anemia DUE TO CHRONIC RENAL INSUFF.  .  Nocturia   . Persistent atrial fibrillation ([place])   . Right knee pain    Past Surgical History:  Procedure Laterality Date  . CATARACT EXTRACTION W/ INTRAOCULAR LENS  IMPLANT, BILATERAL  2012  . HEMORRHOID SURGERY    . HERNIA REPAIR    . KNEE ARTHROSCOPY  12/25/2011   Procedure: ARTHROSCOPY KNEE;  Surgeon: Tobi Bastos, MD;  Location: [facility];  Service: Orthopedics;  Laterality: Right;  right knee arthroscopy with medial menisectomy      FAMILY HISTORY Family History  Problem Relation Age of Onset  . Diabetes Brother   . Heart attack Father   . Early death Father   . Hearing loss Father   . Heart disease Father   . Alcohol abuse Sister   . Cancer Sister     SOCIAL HISTORY Social History   Tobacco Use  . Smoking status: Former Smoker    Packs/day: 1.00    Years: 10.00    Pack years: 10.00    Types: Cigarettes  . Smokeless tobacco: Never Used  Vaping Use  . Vaping Use: Never assessed  Substance Use Topics  . Alcohol use: Yes    Alcohol/week: 14.0 standard drinks    Types: 14 Standard drinks or equivalent per week  . Drug use: No         OPHTHALMIC EXAM:  Base Eye Exam    Visual Acuity (ETDRS)      Right Left   Dist [place] 20/80 +2 20/60 +2   Dist ph [place] 20/60 +2 20/40 -2       Tonometry (Tonopen, 2:32 PM)      Right Left   Pressure 14 12       Pupils      Dark Light Shape React APD   Right 3 2 Round Brisk None   Left 3 2 Round Brisk +1       Visual Fields (Counting fingers)      Left Right     Full   Restrictions Partial outer inferior temporal, inferior nasal deficiencies        Extraocular Movement      Right Left    Full Full       Neuro/Psych    Oriented x3: Yes   Mood/Affect: Normal       Dilation    Left eye: 1.0% Mydriacyl, 2.5% Phenylephrine @ 2:36 PM        Slit Lamp and Fundus Exam    External Exam      Right Left   External Normal Normal       Slit Lamp Exam      Right Left   Lids/Lashes Normal  Normal   Conjunctiva/Sclera White and quiet White and quiet   Cornea Clear Clear   Anterior Chamber Deep and quiet Deep and quiet   Iris Round and reactive Round and reactive   Lens Posterior chamber intraocular lens Posterior chamber intraocular lens   Anterior Vitreous Normal Normal       Fundus Exam      Right Left   Posterior Vitreous  Posterior vitreous detachment   Disc  Pallor 1+   C/D Ratio  0.45   Macula  Atrophy, Age related macular degeneration, Early age related macular degeneration, Macular thickening improved, Disciform scar   Vessels  Normal   Periphery  Normal          IMAGING AND PROCEDURES  Imaging and Procedures for 01/20/20  OCT, Retina - OU - Both Eyes       Right Eye Quality was good. Scan locations included subfoveal. Central Foveal Thickness: 315. Progression has been stable. Findings include abnormal foveal contour, cystoid macular edema, subretinal scarring, intraretinal fluid.   Left Eye Quality was good. Scan locations included subfoveal. Central Foveal Thickness: 370. Progression has worsened. Findings include abnormal foveal contour, subretinal scarring.   Notes OD with much less vitreal macular traction yet still persistent with epiretinal membrane.  Wet AMD has improved on intravitreal Avastin, will repeat injection today at 8-week interval examination again in 8 weeks for chronically smoldering disease  Left side, at 13-week follow-up interval with increased CME activity, from active CNVM, will repeat intravitreal Eylea today and follow-up examination in 8 weeks       Intravitreal Injection, Pharmacologic Agent - OS - Left Eye       Time Out 01/20/2020. 3:14 PM. Confirmed correct patient, procedure, site, and patient consented.   Anesthesia Topical anesthesia was used.   Procedure Preparation included 10% betadine to eyelids, 5% betadine to ocular surface, Ofloxacin . A 30 gauge needle was used.   Injection:  2 mg aflibercept  Alfonse Flavors) SOLN   NDC: [id number], Lot: [id number]   Route: Intravitreal, Site: Left Eye, Waste: 0 mg  Post-op Post injection exam found visual acuity of at least counting fingers. The patient tolerated the procedure well. There were no complications. The patient received written and verbal post procedure care education. Post injection medications were not given.                 ASSESSMENT/PLAN:  Exudative age-related macular degeneration of left eye with active choroidal neovascularization (HCC) The nature of wet macular degeneration was discussed with the patient.  Forms of therapy reviewed include the use of Anti-VEGF medications injected painlessly into the eye, as well as other possible treatment modalities, including thermal laser therapy. Fellow eye involvement and risks were discussed with the patient. Upon the finding of wet age related macular degeneration, treatment will be offered. The treatment regimen is on a treat as needed basis with the intent to treat if necessary and extend interval of exams when possible. On average 1 out of 6 patients do not need lifetime therapy. However, the risk of recurrent disease is high for a lifetime.  Initially monthly, then periodic, examinations and evaluations will determine whether the next treatment is required on the day of the examination.  OS, worse CME and reactivated CNVM at 13-week follow-up interval.  Repeat injection today and examination OS in 8 weeks  Type 2 diabetes mellitus ([place]) No DR      ICD-10-CM   1. Exudative age-related macular degeneration of left eye with  active choroidal neovascularization (HCC)  H35.3221 OCT, Retina - OU - Both Eyes    Intravitreal Injection, Pharmacologic Agent - OS - Left Eye    aflibercept (EYLEA) SOLN 2 mg  2. Type 2 diabetes mellitus without complication, without long-term current use of insulin (HCC)  E11.9     1.  Repeat intravitreal Eylea OS today, follow-up in 8 weeks  2.  OD   follow-up as scheduled  3.  Ophthalmic Meds Ordered this visit:  Meds ordered this encounter  Medications  . aflibercept (EYLEA) SOLN 2 mg       Return in about 8 weeks (around 03/16/2020) for dilate, OS, EYLEA OCT.  Patient Instructions  Patient instructed to report profound worsening of distortion or visual acuity check    Explained the diagnoses, plan, and follow up with the patient and they expressed understanding.  Patient expressed understanding of the importance of proper follow up care.   Clent Demark Jazzmin Newbold M.D. Diseases & Surgery of the Retina and Vitreous Retina [facility] Diabetic [place] 01/20/20     Abbreviations: M myopia (nearsighted); A astigmatism; H hyperopia (farsighted); P presbyopia; Mrx spectacle prescription;  CTL contact lenses; OD right eye; OS left eye; OU both eyes  XT exotropia; ET esotropia; PEK punctate epithelial keratitis; PEE punctate epithelial erosions; DES dry eye syndrome; MGD meibomian gland dysfunction; ATs artificial tears; PFAT's preservative free artificial tears; [place] nuclear sclerotic cataract; PSC posterior subcapsular cataract; ERM epi-retinal membrane; PVD posterior vitreous detachment; RD retinal detachment; DM diabetes mellitus; DR diabetic retinopathy; NPDR non-proliferative diabetic retinopathy; PDR proliferative diabetic retinopathy; CSME clinically significant macular edema; DME diabetic macular edema; dbh dot blot hemorrhages; CWS cotton wool spot; POAG primary open angle glaucoma; C/D cup-to-disc ratio; HVF humphrey visual field; GVF goldmann visual field; OCT optical coherence tomography; IOP intraocular pressure; BRVO Branch retinal vein occlusion; CRVO central retinal vein occlusion; CRAO central retinal artery occlusion; BRAO branch retinal artery occlusion; RT retinal tear; SB scleral buckle; PPV pars plana vitrectomy; VH Vitreous hemorrhage; PRP panretinal laser photocoagulation; IVK intravitreal kenalog; VMT vitreomacular traction; MH  Macular hole;  NVD neovascularization of the disc; NVE neovascularization elsewhere; AREDS age related eye disease study; ARMD age related macular degeneration; POAG primary open angle glaucoma; EBMD epithelial/anterior basement membrane dystrophy; ACIOL anterior chamber intraocular lens; IOL intraocular lens; PCIOL posterior chamber intraocular lens; Phaco/IOL phacoemulsification with intraocular lens placement; [place] photorefractive keratectomy; LASIK laser assisted in situ keratomileusis; HTN hypertension; DM diabetes mellitus; COPD chronic obstructive pulmonary disease

## 2020-01-20 NOTE — Assessment & Plan Note (Signed)
No DR ?

## 2020-01-20 NOTE — Patient Instructions (Signed)
Patient instructed to report profound worsening of distortion or visual acuity check

## 2020-01-20 NOTE — Assessment & Plan Note (Signed)
The nature of wet macular degeneration was discussed with the patient.  Forms of therapy reviewed include the use of Anti-VEGF medications injected painlessly into the eye, as well as other possible treatment modalities, including thermal laser therapy. Fellow eye involvement and risks were discussed with the patient. Upon the finding of wet age related macular degeneration, treatment will be offered. The treatment regimen is on a treat as needed basis with the intent to treat if necessary and extend interval of exams when possible. On average 1 out of 6 patients do not need lifetime therapy. However, the risk of recurrent disease is high for a lifetime.  Initially monthly, then periodic, examinations and evaluations will determine whether the next treatment is required on the day of the examination.  OS, worse CME and reactivated CNVM at 13-week follow-up interval.  Repeat injection today and examination OS in 8 weeks

## 2020-01-21 ENCOUNTER — Telehealth: Payer: Self-pay | Admitting: Physician Assistant

## 2020-01-21 DIAGNOSIS — M5116 Intervertebral disc disorders with radiculopathy, lumbar region: Secondary | ICD-10-CM | POA: Diagnosis not present

## 2020-01-21 DIAGNOSIS — I2510 Atherosclerotic heart disease of native coronary artery without angina pectoris: Secondary | ICD-10-CM | POA: Diagnosis not present

## 2020-01-21 DIAGNOSIS — M479 Spondylosis, unspecified: Secondary | ICD-10-CM | POA: Diagnosis not present

## 2020-01-21 DIAGNOSIS — M1909 Primary osteoarthritis, other specified site: Secondary | ICD-10-CM | POA: Diagnosis not present

## 2020-01-21 DIAGNOSIS — M170 Bilateral primary osteoarthritis of knee: Secondary | ICD-10-CM | POA: Diagnosis not present

## 2020-01-21 DIAGNOSIS — I4819 Other persistent atrial fibrillation: Secondary | ICD-10-CM | POA: Diagnosis not present

## 2020-01-21 NOTE — Telephone Encounter (Signed)
appt confirmed for 01/22/20 for pre op clearance. I will forward to [facility] for appt tomorrow. Will remove from the pre op call back pool.

## 2020-01-21 NOTE — Telephone Encounter (Signed)
I tried to reach the pt to inform that he will need an appt for pre op clearance. I left message that we could offer tomorrow 01/22/20 @ 10:45 with Robbie Lis, PAC. Left message I will hold the slot for him though he will need to call back today before 5 pm to confirm appt or change appt. If pt does confirm then the actual appt still needs to be made.

## 2020-01-21 NOTE — Telephone Encounter (Signed)
        I went in pt chart to see who called the pt and I made an appt for tomorrow(01-22-20)

## 2020-01-22 ENCOUNTER — Ambulatory Visit (INDEPENDENT_AMBULATORY_CARE_PROVIDER_SITE_OTHER): Payer: Medicare Other | Admitting: Physician Assistant

## 2020-01-22 ENCOUNTER — Other Ambulatory Visit: Payer: Self-pay

## 2020-01-22 ENCOUNTER — Encounter: Payer: Self-pay | Admitting: Physician Assistant

## 2020-01-22 VITALS — BP 116/54 | HR 73 | Ht 64.0 in | Wt 141.0 lb

## 2020-01-22 DIAGNOSIS — I350 Nonrheumatic aortic (valve) stenosis: Secondary | ICD-10-CM | POA: Diagnosis not present

## 2020-01-22 DIAGNOSIS — M1909 Primary osteoarthritis, other specified site: Secondary | ICD-10-CM | POA: Diagnosis not present

## 2020-01-22 DIAGNOSIS — I2510 Atherosclerotic heart disease of native coronary artery without angina pectoris: Secondary | ICD-10-CM | POA: Diagnosis not present

## 2020-01-22 DIAGNOSIS — M5116 Intervertebral disc disorders with radiculopathy, lumbar region: Secondary | ICD-10-CM | POA: Diagnosis not present

## 2020-01-22 DIAGNOSIS — I4819 Other persistent atrial fibrillation: Secondary | ICD-10-CM | POA: Diagnosis not present

## 2020-01-22 DIAGNOSIS — I10 Essential (primary) hypertension: Secondary | ICD-10-CM

## 2020-01-22 DIAGNOSIS — M479 Spondylosis, unspecified: Secondary | ICD-10-CM | POA: Diagnosis not present

## 2020-01-22 DIAGNOSIS — I5032 Chronic diastolic (congestive) heart failure: Secondary | ICD-10-CM

## 2020-01-22 DIAGNOSIS — M170 Bilateral primary osteoarthritis of knee: Secondary | ICD-10-CM | POA: Diagnosis not present

## 2020-01-22 DIAGNOSIS — I480 Paroxysmal atrial fibrillation: Secondary | ICD-10-CM

## 2020-01-22 DIAGNOSIS — Z01810 Encounter for preprocedural cardiovascular examination: Secondary | ICD-10-CM

## 2020-01-22 NOTE — Progress Notes (Addendum)
Cardiology Office Note:    Date:  01/22/2020   ID:  Adam [name], DOB [birth year], MRN [id number]  PCP:  Javier Glazier, MD  [facility] Cardiologist:  Dorris Carnes, MD  [place] Electrophysiologist:  None   Chief Complaint: Surgical clearance for Posterior lumbar interbody fusion and Epidural steroid injection   History of Present Illness:    Adam [name] is a 86 y.o. male with a hx of PAF, chronic diastolic CHF, CKD III/IV. Moderate AS, HTN with orthostasis, diet-controlled diabetes mellitus, GERD, BPH and gait imbalance seen for surgical clearance.   Admitted January 2020 with altered mental status and hypoxia in setting of hypertensive urgency.MRI/MRA of the brain showed no acute signs of a stroke, but did show some focal moderate proximal left M1 stenosis with moderate proximal right P2 stenosis. Neurology concern for hypertensive encephalopathyandless likely TIA.Follow-up 30-day event monitor without arrhythmia.  Admitted 08/2019 for pneumonia. Also found to have afib RVR with post conversion pause up to 4.9 sec. Seen by Dr. Lovena Le for intermittent afib. Started on Amiodarone and Eliquis.   Admitted 09/2019 with acute on chronic dCHF exacerbation.   Here today for surgical clearance with daughter.  The patient does not want surgery but okay with epidural injection next month.  He is doing physical therapy at [facility] without cardiac symptoms.  Patient denies chest pain, shortness of breath, orthopnea, PND, syncope, lower extremity edema or melena.  Past Medical History:  Diagnosis Date  . Acute meniscal tear of knee RIGHT KNEE  . Arthritis   . BPH (benign prostatic hypertrophy)   . Colon polyps    hyperplastic  . Diabetes mellitus type 2, diet-controlled (HCC) AND EXERCISE-- LAST A1C  5.6  . Diverticulosis   . Gait abnormality 11/08/2016  . GERD (gastroesophageal reflux disease)   . Glaucoma   . H/O hiatal hernia   . Heart murmur   . History of esophageal  stricture S/P DILATION IN 2007  . Hyperlipidemia   . Hypertension   . IgM monoclonal gammopathy of uncertain significance ASYMPTOMATIC    FOLLOWED BY DR Alen Blew  . Internal hemorrhoids   . Macrocytic anemia DUE TO CHRONIC RENAL INSUFF.  . Nocturia   . Persistent atrial fibrillation ([place])   . Right knee pain     Past Surgical History:  Procedure Laterality Date  . CATARACT EXTRACTION W/ INTRAOCULAR LENS  IMPLANT, BILATERAL  2012  . HEMORRHOID SURGERY    . HERNIA REPAIR    . KNEE ARTHROSCOPY  12/25/2011   Procedure: ARTHROSCOPY KNEE;  Surgeon: Tobi Bastos, MD;  Location: [facility];  Service: Orthopedics;  Laterality: Right;  right knee arthroscopy with medial menisectomy      Current Medications: Current Meds  Medication Sig  . acetaminophen (TYLENOL) 500 MG tablet Take 500 mg by mouth daily as needed for headache (pain).  Marland Kitchen amiodarone (PACERONE) 100 MG tablet Take 100 mg by mouth daily.  Marland Kitchen apixaban (ELIQUIS) 2.5 MG TABS tablet Take 1 tablet (2.5 mg total) by mouth 2 (two) times daily.  . Ascorbic Acid (VITAMIN C) 500 MG CAPS Take 500 mg by mouth 2 (two) times daily.  Marland Kitchen atorvastatin (LIPITOR) 20 MG tablet Take 1 tablet (20 mg total) by mouth daily.  . betamethasone dipropionate (DIPROLENE) 0.05 % ointment Apply 1 application topically daily as needed (itching/rash).   Marland Kitchen Bioflavonoid Products (ESTER C PO) Take 1 tablet by mouth 2 (two) times daily.  . brimonidine-timolol (COMBIGAN) 0.2-0.5 % ophthalmic solution Place 1 drop  into the left eye 2 (two) times daily.   . clobetasol cream (TEMOVATE) 6.78 % Apply 1 application topically daily as needed (itching/rash).   . febuxostat (ULORIC) 40 MG tablet Take 40 mg by mouth See admin instructions. Take one tablet (40 mg) by mouth Tuesday, Friday, Sunday evening  . ferrous sulfate 324 MG TBEC Take 324 mg by mouth daily with breakfast.  . finasteride (PROSCAR) 5 MG tablet Take 5 mg by mouth daily.   . furosemide (LASIX)  40 MG tablet Take 40 mg by mouth. 80mg  in the AM; 40mg  in the PM  . gabapentin (NEURONTIN) 100 MG capsule Take 100 mg by mouth 3 (three) times daily.   . hydrALAZINE (APRESOLINE) 100 MG tablet Take 1 tablet (100 mg total) by mouth every 8 (eight) hours.  . methylPREDNISolone (MEDROL DOSEPAK) 4 MG TBPK tablet Use as directed  . Multiple Vitamins-Minerals (PRESERVISION AREDS PO) Take 1 capsule by mouth 2 (two) times daily.   . Netarsudil-Latanoprost (ROCKLATAN) 0.02-0.005 % SOLN Place 1 drop into the left eye at bedtime.   Marland Kitchen omeprazole (PRILOSEC OTC) 20 MG tablet Take 20 mg by mouth daily with lunch.   . traMADol (ULTRAM) 50 MG tablet Take 1 tablet (50 mg total) by mouth every 6 (six) hours as needed for up to 12 doses.     Allergies:   Patient has no known allergies.   Social History   Socioeconomic History  . Marital status: Widowed    Spouse name: Mechele Claude  . Number of children: 2  . Years of education: 85  . Highest education level: Not on file  Occupational History  . Not on file  Tobacco Use  . Smoking status: Former Smoker    Packs/day: 1.00    Years: 10.00    Pack years: 10.00    Types: Cigarettes  . Smokeless tobacco: Never Used  Vaping Use  . Vaping Use: Never assessed  Substance and Sexual Activity  . Alcohol use: Yes    Alcohol/week: 14.0 standard drinks    Types: 14 Standard drinks or equivalent per week  . Drug use: No  . Sexual activity: Not on file  Other Topics Concern  . Not on file  Social History Narrative   Lives with wife, Mechele Claude   Caffeine use: Diet coke daily   Right-handed   Social Determinants of Health   Financial Resource Strain:   . Difficulty of Paying Living Expenses: Not on file  Food Insecurity:   . Worried About Charity fundraiser in the Last Year: Not on file  . Ran Out of Food in the Last Year: Not on file  Transportation Needs:   . Lack of Transportation (Medical): Not on file  . Lack of Transportation (Non-Medical): Not on file   Physical Activity:   . Days of Exercise per Week: Not on file  . Minutes of Exercise per Session: Not on file  Stress:   . Feeling of Stress : Not on file  Social Connections:   . Frequency of Communication with Friends and Family: Not on file  . Frequency of Social Gatherings with Friends and Family: Not on file  . Attends Religious Services: Not on file  . Active Member of Clubs or Organizations: Not on file  . Attends Archivist Meetings: Not on file  . Marital Status: Not on file     Family History: The patient's family history includes Alcohol abuse in his sister; Cancer in his sister; Diabetes in his  brother; Early death in his father; Hearing loss in his father; Heart attack in his father; Heart disease in his father.    ROS:   Please see the history of present illness.    All other systems reviewed and are negative.  EKGs/Labs/Other Studies Reviewed:    The following studies were reviewed today:  Echo 08/04/2019 1. Left ventricular ejection fraction, by estimation, is 60 to 65%. The  left ventricle has normal function. The left ventricle has no regional  wall motion abnormalities. Left ventricular diastolic parameters are  consistent with Grade II diastolic  dysfunction (pseudonormalization).  2. Right ventricular systolic function is normal. The right ventricular  size is normal.  3. Left atrial size was mild to moderately dilated.  4. Right atrial size was mildly dilated.  5. The mitral valve is grossly normal. Mild mitral valve regurgitation.  No evidence of mitral stenosis.  6. The aortic valve is abnormal. Aortic valve regurgitation is trivial.  Moderate aortic valve stenosis.  7. The inferior vena cava is dilated in size with >50% respiratory  variability, suggesting right atrial pressure of 8 mmHg.   EKG:  EKG is ordered today.  The ekg done 01/04/2020  demonstrates afib at 132 bpm Recent Labs: 08/04/2019: TSH 2.985 08/05/2019: Magnesium  2.0 09/04/2019: ALT 18; NT-Pro BNP 3,337 09/05/2019: B Natriuretic Peptide 291.4 01/04/2020: BUN 72; Creatinine, Ser 3.06; Hemoglobin 11.5; Platelets 243; Potassium 3.5; Sodium 134  Recent Lipid Panel    Component Value Date/Time   CHOL 106 08/05/2019 0359   TRIG 85 08/05/2019 0359   HDL 33 (L) 08/05/2019 0359   CHOLHDL 3.2 08/05/2019 0359   VLDL 17 08/05/2019 0359   LDLCALC 56 08/05/2019 0359   Physical Exam:    VS:  BP (!) 116/54   Pulse 73   Ht 5\' 4"  (1.626 m)   Wt 141 lb (64 kg)   SpO2 99%   BMI 24.20 kg/m     Wt Readings from Last 3 Encounters:  01/22/20 141 lb (64 kg)  01/04/20 147 lb 7.8 oz (66.9 kg)  09/08/19 147 lb 8 oz (66.9 kg)     GEN:  Well nourished, well developed in no acute distress HEENT: Normal NECK: No JVD; No carotid bruits LYMPHATICS: No lymphadenopathy CARDIAC: RRR,systolic  murmurs, rubs, gallops RESPIRATORY:  Clear to auscultation without rales, wheezing or rhonchi  ABDOMEN: Soft, non-tender, non-distended MUSCULOSKELETAL:  No edema; No deformity  SKIN: Warm and dry NEUROLOGIC:  Alert and oriented x 3 PSYCHIATRIC:  Normal affect   ASSESSMENT AND PLAN:    1. Paroxysmal atrial fibrillation Sinus rhythm by exam.  Continue amiodarone and Eliquis.  No bleeding issue.  2.  Chronic diastolic heart failure Patient is euvolemic.  Continue current dose of diuretics  3.  Moderate aortic stenosis -Follow-up with routine echocardiogram.  No syncope.  4.  Hypertension Blood pressure stable and well-controlled  5.  Surgical clearance -Patient doing physical therapy without cardiac symptoms.  He is getting greater than 4 METS of activity. Given past medical history and time since last visit, based on ACC/AHA guidelines, Adam [name] would be at acceptable risk for the planned procedure without further cardiovascular testing.   Per Pharmacist "to hold eliquis for 3 days prior to procedure".    Medication Adjustments/Labs and Tests Ordered: Current  medicines are reviewed at length with the patient today.  Concerns regarding medicines are outlined above.  No orders of the defined types were placed in this encounter.  No orders of the  defined types were placed in this encounter.   Patient Instructions  Medication Instructions:  Your physician recommends that you continue on your current medications as directed. Please refer to the Current Medication list given to you today.  *If you need a refill on your cardiac medications before your next appointment, please call your pharmacy*   Lab Work: None ordered  If you have labs (blood work) drawn today and your tests are completely normal, you will receive your results only by: Marland Kitchen MyChart Message (if you have MyChart) OR . A paper copy in the mail If you have any lab test that is abnormal or we need to change your treatment, we will call you to review the results.   Testing/Procedures: None ordered   Follow-Up: At [facility], you and your health needs are our priority.  As part of our continuing mission to provide you with exceptional heart care, we have created designated Provider Care Teams.  These Care Teams include your primary Cardiologist (physician) and Advanced Practice Providers (APPs -  Physician Assistants and Nurse Practitioners) who all work together to provide you with the care you need, when you need it.  We recommend signing up for the patient portal called "MyChart".  Sign up information is provided on this After Visit Summary.  MyChart is used to connect with patients for Virtual Visits (Telemedicine).  Patients are able to view lab/test results, encounter notes, upcoming appointments, etc.  Non-urgent messages can be sent to your provider as well.   To learn more about what you can do with MyChart, go to [email or web address].    Your next appointment:   6 month(s)  The format for your next appointment:   In Person  Provider:   You may see Dorris Carnes, MD  or one of the following Advanced Practice Providers on your designated Care Team:    Richardson Dopp, PA-C  Robbie Lis, PA-C    Other Instructions      Signed, Leanor Kail, [place]  01/22/2020 11:03 AM    [place]  Patient with a CHA2DS2VASc of 5. Ok to hold eliquis for 3 days prior to procedure. Ramond Dial, Pharm.D, BCPS, CPP [place]  [address] [place], [place] [address]  Phone: [phone]; Fax: [phone]

## 2020-01-23 DIAGNOSIS — M1909 Primary osteoarthritis, other specified site: Secondary | ICD-10-CM | POA: Diagnosis not present

## 2020-01-23 DIAGNOSIS — M5116 Intervertebral disc disorders with radiculopathy, lumbar region: Secondary | ICD-10-CM | POA: Diagnosis not present

## 2020-01-23 DIAGNOSIS — M479 Spondylosis, unspecified: Secondary | ICD-10-CM | POA: Diagnosis not present

## 2020-01-23 DIAGNOSIS — I2510 Atherosclerotic heart disease of native coronary artery without angina pectoris: Secondary | ICD-10-CM | POA: Diagnosis not present

## 2020-01-23 DIAGNOSIS — I4819 Other persistent atrial fibrillation: Secondary | ICD-10-CM | POA: Diagnosis not present

## 2020-01-23 DIAGNOSIS — M170 Bilateral primary osteoarthritis of knee: Secondary | ICD-10-CM | POA: Diagnosis not present

## 2020-01-27 DIAGNOSIS — M170 Bilateral primary osteoarthritis of knee: Secondary | ICD-10-CM | POA: Diagnosis not present

## 2020-01-27 DIAGNOSIS — I4819 Other persistent atrial fibrillation: Secondary | ICD-10-CM | POA: Diagnosis not present

## 2020-01-27 DIAGNOSIS — I2510 Atherosclerotic heart disease of native coronary artery without angina pectoris: Secondary | ICD-10-CM | POA: Diagnosis not present

## 2020-01-27 DIAGNOSIS — M1909 Primary osteoarthritis, other specified site: Secondary | ICD-10-CM | POA: Diagnosis not present

## 2020-01-27 DIAGNOSIS — M479 Spondylosis, unspecified: Secondary | ICD-10-CM | POA: Diagnosis not present

## 2020-01-27 DIAGNOSIS — M5116 Intervertebral disc disorders with radiculopathy, lumbar region: Secondary | ICD-10-CM | POA: Diagnosis not present

## 2020-01-29 DIAGNOSIS — M5116 Intervertebral disc disorders with radiculopathy, lumbar region: Secondary | ICD-10-CM | POA: Diagnosis not present

## 2020-01-29 DIAGNOSIS — M1909 Primary osteoarthritis, other specified site: Secondary | ICD-10-CM | POA: Diagnosis not present

## 2020-01-29 DIAGNOSIS — I2510 Atherosclerotic heart disease of native coronary artery without angina pectoris: Secondary | ICD-10-CM | POA: Diagnosis not present

## 2020-01-29 DIAGNOSIS — M479 Spondylosis, unspecified: Secondary | ICD-10-CM | POA: Diagnosis not present

## 2020-01-29 DIAGNOSIS — I4819 Other persistent atrial fibrillation: Secondary | ICD-10-CM | POA: Diagnosis not present

## 2020-01-29 DIAGNOSIS — M170 Bilateral primary osteoarthritis of knee: Secondary | ICD-10-CM | POA: Diagnosis not present

## 2020-02-03 ENCOUNTER — Telehealth: Payer: Self-pay | Admitting: *Deleted

## 2020-02-03 NOTE — Telephone Encounter (Signed)
    [facility] Pre-operative Risk Assessment    HEARTCARE STAFF: - Please ensure there is not already an duplicate clearance open for this procedure. - Under Visit Info/Reason for Call, type in Other and utilize the format Clearance MM/DD/YY or Clearance TBD. Do not use dashes or single digits. - If request is for dental extraction, please clarify the # of teeth to be extracted.  Request for surgical clearance: I AM NOT SURE IF THIS IS A DUPLICATE TO THE CLEARANCE REQUEST ENTERED 01/19/20 FROM [facility]  1. What type of surgery is being performed? LUMBAR STEROID INJECTION   2. When is this surgery scheduled? 03/01/20   3. What type of clearance is required (medical clearance vs. Pharmacy clearance to hold med vs. Both)? BOTH  4. Are there any medications that need to be held prior to surgery and how long? ELIQUIS x 3 DAYS PRIOR TO INJECTION   5. Practice name and name of physician performing surgery? [facility]; DR. Davy Pique   6. What is the office phone number? [phone]   7.   What is the office fax number? [phone]  8.   Anesthesia type (None, local, MAC, general) ? LOCAL   Julaine Hua 02/03/2020, 10:43 AM  _________________________________________________________________   (provider comments below)

## 2020-02-03 NOTE — Telephone Encounter (Signed)
   Primary Cardiologist: Dorris Carnes, MD  Chart reviewed as part of pre-operative protocol coverage. Patient was contacted 02/03/2020 in reference to pre-operative risk assessment for pending surgery as outlined below.  Adam [name] was last seen on 01/22/2020 by Gilberto Better PA-C.  He was cleared for the procedure that day. Per Pharmacist "to hold eliquis for 3 days prior to procedure".    Therefore, based on ACC/AHA guidelines, the patient would be at acceptable risk for the planned procedure without further cardiovascular testing.   The patient was advised that if he develops new symptoms prior to surgery to contact our office to arrange for a follow-up visit, and he verbalized understanding.  I will route this recommendation to the requesting party via Epic fax function and remove from pre-op pool. Please call with questions.  [place], [place] 02/03/2020, 4:29 PM

## 2020-02-04 DIAGNOSIS — I2510 Atherosclerotic heart disease of native coronary artery without angina pectoris: Secondary | ICD-10-CM | POA: Diagnosis not present

## 2020-02-04 DIAGNOSIS — M1909 Primary osteoarthritis, other specified site: Secondary | ICD-10-CM | POA: Diagnosis not present

## 2020-02-04 DIAGNOSIS — M170 Bilateral primary osteoarthritis of knee: Secondary | ICD-10-CM | POA: Diagnosis not present

## 2020-02-04 DIAGNOSIS — I4819 Other persistent atrial fibrillation: Secondary | ICD-10-CM | POA: Diagnosis not present

## 2020-02-04 DIAGNOSIS — M479 Spondylosis, unspecified: Secondary | ICD-10-CM | POA: Diagnosis not present

## 2020-02-04 DIAGNOSIS — M5116 Intervertebral disc disorders with radiculopathy, lumbar region: Secondary | ICD-10-CM | POA: Diagnosis not present

## 2020-02-05 DIAGNOSIS — E1122 Type 2 diabetes mellitus with diabetic chronic kidney disease: Secondary | ICD-10-CM | POA: Diagnosis not present

## 2020-02-05 DIAGNOSIS — I130 Hypertensive heart and chronic kidney disease with heart failure and stage 1 through stage 4 chronic kidney disease, or unspecified chronic kidney disease: Secondary | ICD-10-CM | POA: Diagnosis not present

## 2020-02-05 DIAGNOSIS — D509 Iron deficiency anemia, unspecified: Secondary | ICD-10-CM | POA: Diagnosis not present

## 2020-02-05 DIAGNOSIS — M170 Bilateral primary osteoarthritis of knee: Secondary | ICD-10-CM | POA: Diagnosis not present

## 2020-02-05 DIAGNOSIS — I2510 Atherosclerotic heart disease of native coronary artery without angina pectoris: Secondary | ICD-10-CM | POA: Diagnosis not present

## 2020-02-05 DIAGNOSIS — M1909 Primary osteoarthritis, other specified site: Secondary | ICD-10-CM | POA: Diagnosis not present

## 2020-02-05 DIAGNOSIS — I700 Atherosclerosis of aorta: Secondary | ICD-10-CM | POA: Diagnosis not present

## 2020-02-05 DIAGNOSIS — R351 Nocturia: Secondary | ICD-10-CM | POA: Diagnosis not present

## 2020-02-05 DIAGNOSIS — E785 Hyperlipidemia, unspecified: Secondary | ICD-10-CM | POA: Diagnosis not present

## 2020-02-05 DIAGNOSIS — H353231 Exudative age-related macular degeneration, bilateral, with active choroidal neovascularization: Secondary | ICD-10-CM | POA: Diagnosis not present

## 2020-02-05 DIAGNOSIS — M5116 Intervertebral disc disorders with radiculopathy, lumbar region: Secondary | ICD-10-CM | POA: Diagnosis not present

## 2020-02-05 DIAGNOSIS — D472 Monoclonal gammopathy: Secondary | ICD-10-CM | POA: Diagnosis not present

## 2020-02-05 DIAGNOSIS — D631 Anemia in chronic kidney disease: Secondary | ICD-10-CM | POA: Diagnosis not present

## 2020-02-05 DIAGNOSIS — H35371 Puckering of macula, right eye: Secondary | ICD-10-CM | POA: Diagnosis not present

## 2020-02-05 DIAGNOSIS — I0981 Rheumatic heart failure: Secondary | ICD-10-CM | POA: Diagnosis not present

## 2020-02-05 DIAGNOSIS — M479 Spondylosis, unspecified: Secondary | ICD-10-CM | POA: Diagnosis not present

## 2020-02-05 DIAGNOSIS — I083 Combined rheumatic disorders of mitral, aortic and tricuspid valves: Secondary | ICD-10-CM | POA: Diagnosis not present

## 2020-02-05 DIAGNOSIS — D539 Nutritional anemia, unspecified: Secondary | ICD-10-CM | POA: Diagnosis not present

## 2020-02-05 DIAGNOSIS — N401 Enlarged prostate with lower urinary tract symptoms: Secondary | ICD-10-CM | POA: Diagnosis not present

## 2020-02-05 DIAGNOSIS — K5790 Diverticulosis of intestine, part unspecified, without perforation or abscess without bleeding: Secondary | ICD-10-CM | POA: Diagnosis not present

## 2020-02-05 DIAGNOSIS — I5033 Acute on chronic diastolic (congestive) heart failure: Secondary | ICD-10-CM | POA: Diagnosis not present

## 2020-02-05 DIAGNOSIS — D630 Anemia in neoplastic disease: Secondary | ICD-10-CM | POA: Diagnosis not present

## 2020-02-05 DIAGNOSIS — H409 Unspecified glaucoma: Secondary | ICD-10-CM | POA: Diagnosis not present

## 2020-02-05 DIAGNOSIS — I4819 Other persistent atrial fibrillation: Secondary | ICD-10-CM | POA: Diagnosis not present

## 2020-02-05 DIAGNOSIS — N184 Chronic kidney disease, stage 4 (severe): Secondary | ICD-10-CM | POA: Diagnosis not present

## 2020-02-08 ENCOUNTER — Encounter (INDEPENDENT_AMBULATORY_CARE_PROVIDER_SITE_OTHER): Payer: Medicare Other | Admitting: Ophthalmology

## 2020-02-10 DIAGNOSIS — I2510 Atherosclerotic heart disease of native coronary artery without angina pectoris: Secondary | ICD-10-CM | POA: Diagnosis not present

## 2020-02-10 DIAGNOSIS — I4819 Other persistent atrial fibrillation: Secondary | ICD-10-CM | POA: Diagnosis not present

## 2020-02-10 DIAGNOSIS — M5116 Intervertebral disc disorders with radiculopathy, lumbar region: Secondary | ICD-10-CM | POA: Diagnosis not present

## 2020-02-10 DIAGNOSIS — M479 Spondylosis, unspecified: Secondary | ICD-10-CM | POA: Diagnosis not present

## 2020-02-10 DIAGNOSIS — M170 Bilateral primary osteoarthritis of knee: Secondary | ICD-10-CM | POA: Diagnosis not present

## 2020-02-10 DIAGNOSIS — M1909 Primary osteoarthritis, other specified site: Secondary | ICD-10-CM | POA: Diagnosis not present

## 2020-02-11 ENCOUNTER — Other Ambulatory Visit: Payer: Self-pay

## 2020-02-11 ENCOUNTER — Encounter (INDEPENDENT_AMBULATORY_CARE_PROVIDER_SITE_OTHER): Payer: Self-pay | Admitting: Ophthalmology

## 2020-02-11 ENCOUNTER — Ambulatory Visit (INDEPENDENT_AMBULATORY_CARE_PROVIDER_SITE_OTHER): Payer: Medicare Other | Admitting: Ophthalmology

## 2020-02-11 DIAGNOSIS — H353211 Exudative age-related macular degeneration, right eye, with active choroidal neovascularization: Secondary | ICD-10-CM | POA: Diagnosis not present

## 2020-02-11 DIAGNOSIS — H35371 Puckering of macula, right eye: Secondary | ICD-10-CM

## 2020-02-11 MED ORDER — BEVACIZUMAB CHEMO INJECTION 1.25MG/0.05ML SYRINGE FOR KALEIDOSCOPE
1.2500 mg | INTRAVITREAL | Status: AC | PRN
  Administered 2020-02-11: 1.25 mg via INTRAVITREAL

## 2020-02-11 NOTE — Progress Notes (Signed)
02/11/2020     CHIEF COMPLAINT Patient presents for Retina Follow Up   HISTORY OF PRESENT ILLNESS: Adam [name] is a 86 y.o. male who presents to the clinic today for:   HPI    Retina Follow Up    Patient presents with  Wet AMD.  In right eye.  This started 8 weeks ago.  Severity is mild.  Duration of 8 weeks.  Since onset it is stable.          Comments    8 Week AMD F/U OD, poss Avastin OD  Pt denies noticeable changes to [place] OU since last visit. Pt denies ocular pain, flashes of light, or floaters OU.         Last edited by Rockie Neighbours, [place] on 02/11/2020  2:46 PM. (History)      Referring physician: Javier Glazier, MD [place],  [place] [address]  HISTORICAL INFORMATION:   Selected notes from the MEDICAL RECORD NUMBER    Lab Results  Component Value Date   HGBA1C 5.7 (H) 08/03/2019     CURRENT MEDICATIONS: Current Outpatient Medications (Ophthalmic Drugs)  Medication Sig  . brimonidine-timolol (COMBIGAN) 0.2-0.5 % ophthalmic solution Place 1 drop into the left eye 2 (two) times daily.   . Netarsudil-Latanoprost (ROCKLATAN) 0.02-0.005 % SOLN Place 1 drop into the left eye at bedtime.    No current facility-administered medications for this visit. (Ophthalmic Drugs)   Current Outpatient Medications (Other)  Medication Sig  . acetaminophen (TYLENOL) 500 MG tablet Take 500 mg by mouth daily as needed for headache (pain).  Marland Kitchen amiodarone (PACERONE) 100 MG tablet Take 100 mg by mouth daily.  Marland Kitchen apixaban (ELIQUIS) 2.5 MG TABS tablet Take 1 tablet (2.5 mg total) by mouth 2 (two) times daily.  . Ascorbic Acid (VITAMIN C) 500 MG CAPS Take 500 mg by mouth 2 (two) times daily.  Marland Kitchen atorvastatin (LIPITOR) 20 MG tablet Take 1 tablet (20 mg total) by mouth daily.  . betamethasone dipropionate (DIPROLENE) 0.05 % ointment Apply 1 application topically daily as needed (itching/rash).   Marland Kitchen Bioflavonoid Products (ESTER C PO) Take 1 tablet by mouth 2 (two) times  daily.  . clobetasol cream (TEMOVATE) 3.55 % Apply 1 application topically daily as needed (itching/rash).   . febuxostat (ULORIC) 40 MG tablet Take 40 mg by mouth See admin instructions. Take one tablet (40 mg) by mouth Tuesday, Friday, Sunday evening  . ferrous sulfate 324 MG TBEC Take 324 mg by mouth daily with breakfast.  . finasteride (PROSCAR) 5 MG tablet Take 5 mg by mouth daily.   . furosemide (LASIX) 40 MG tablet Take 40 mg by mouth. 80mg  in the AM; 40mg  in the PM  . gabapentin (NEURONTIN) 100 MG capsule Take 100 mg by mouth 3 (three) times daily.   . hydrALAZINE (APRESOLINE) 100 MG tablet Take 1 tablet (100 mg total) by mouth every 8 (eight) hours.  . methylPREDNISolone (MEDROL DOSEPAK) 4 MG TBPK tablet Use as directed  . Multiple Vitamins-Minerals (PRESERVISION AREDS PO) Take 1 capsule by mouth 2 (two) times daily.   Marland Kitchen omeprazole (PRILOSEC OTC) 20 MG tablet Take 20 mg by mouth daily with lunch.   . traMADol (ULTRAM) 50 MG tablet Take 1 tablet (50 mg total) by mouth every 6 (six) hours as needed for up to 12 doses.   No current facility-administered medications for this visit. (Other)      REVIEW OF SYSTEMS:    ALLERGIES No Known Allergies  PAST MEDICAL HISTORY Past Medical History:  Diagnosis Date  . Acute meniscal tear of knee RIGHT KNEE  . Arthritis   . BPH (benign prostatic hypertrophy)   . Colon polyps    hyperplastic  . Diabetes mellitus type 2, diet-controlled (HCC) AND EXERCISE-- LAST A1C  5.6  . Diverticulosis   . Gait abnormality 11/08/2016  . GERD (gastroesophageal reflux disease)   . Glaucoma   . H/O hiatal hernia   . Heart murmur   . History of esophageal stricture S/P DILATION IN 2007  . Hyperlipidemia   . Hypertension   . IgM monoclonal gammopathy of uncertain significance ASYMPTOMATIC    FOLLOWED BY DR Alen Blew  . Internal hemorrhoids   . Macrocytic anemia DUE TO CHRONIC RENAL INSUFF.  . Nocturia   . Persistent atrial fibrillation ([place])   . Right  knee pain    Past Surgical History:  Procedure Laterality Date  . CATARACT EXTRACTION W/ INTRAOCULAR LENS  IMPLANT, BILATERAL  2012  . HEMORRHOID SURGERY    . HERNIA REPAIR    . KNEE ARTHROSCOPY  12/25/2011   Procedure: ARTHROSCOPY KNEE;  Surgeon: Tobi Bastos, MD;  Location: [facility];  Service: Orthopedics;  Laterality: Right;  right knee arthroscopy with medial menisectomy      FAMILY HISTORY Family History  Problem Relation Age of Onset  . Diabetes Brother   . Heart attack Father   . Early death Father   . Hearing loss Father   . Heart disease Father   . Alcohol abuse Sister   . Cancer Sister     SOCIAL HISTORY Social History   Tobacco Use  . Smoking status: Former Smoker    Packs/day: 1.00    Years: 10.00    Pack years: 10.00    Types: Cigarettes  . Smokeless tobacco: Never Used  Vaping Use  . Vaping Use: Never assessed  Substance Use Topics  . Alcohol use: Yes    Alcohol/week: 14.0 standard drinks    Types: 14 Standard drinks or equivalent per week  . Drug use: No         OPHTHALMIC EXAM:  Base Eye Exam    Visual Acuity (ETDRS)      Right Left   Dist [place] 20/60 +1 20/40 +2   Dist ph [place] 20/50 -2 20/30 +2       Tonometry (Tonopen, 2:46 PM)      Right Left   Pressure 10 08       Pupils      Dark Light Shape React APD   Right 3 2 Round Brisk None   Left 3 2 Round Brisk +1       Visual Fields (Counting fingers)      Left Right     Full   Restrictions Partial outer inferior temporal, inferior nasal deficiencies        Extraocular Movement      Right Left    Full Full       Neuro/Psych    Oriented x3: Yes   Mood/Affect: Normal       Dilation    Right eye: 1.0% Mydriacyl, 2.5% Phenylephrine @ 2:52 PM        Slit Lamp and Fundus Exam    External Exam      Right Left   External Normal Normal       Slit Lamp Exam      Right Left   Lids/Lashes Normal Normal   Conjunctiva/Sclera White and  quiet White and quiet    Cornea Clear Clear   Anterior Chamber Deep and quiet Deep and quiet   Iris Round and reactive Round and reactive   Lens Posterior chamber intraocular lens Posterior chamber intraocular lens   Anterior Vitreous Normal Normal       Fundus Exam      Right Left   Posterior Vitreous Posterior vitreous detachment    Disc Normal    C/D Ratio 0.2    Macula Retinal pigment epithelial mottling, Subretinal neovascular membrane, no hemorrhage, Retinal pigment epithelial atrophy, Macular atrophy, Early age related macular degeneration, no exudates, Epiretinal membrane    Vessels Normal    Periphery Normal           IMAGING AND PROCEDURES  Imaging and Procedures for 02/11/20  OCT, Retina - OU - Both Eyes       Right Eye Quality was good. Scan locations included subfoveal. Central Foveal Thickness: 372. Progression has improved. Findings include vitreous traction, outer retinal atrophy, epiretinal membrane.   Left Eye Quality was good. Scan locations included subfoveal. Central Foveal Thickness: 262. Progression has improved. Findings include central retinal atrophy, outer retinal atrophy, inner retinal atrophy.   Notes Less active CME from CNVM OD currently at 8-week follow-up on Avastin.  Will maintain current interval of 8-week follow-up for the right eye       Intravitreal Injection, Pharmacologic Agent - OD - Right Eye       Time Out 02/11/2020. 3:24 PM. Confirmed correct patient, procedure, site, and patient consented.   Anesthesia Topical anesthesia was used. Anesthetic medications included [place] 3.5%.   Procedure Preparation included 5% betadine to ocular surface, Tobramycin 0.3%, 10% betadine to eyelids. A 30 gauge needle was used.   Injection:  1.25 mg Bevacizumab (AVASTIN) SOLN   NDC: 70360-001-02, Lot: [id number]   Route: Intravitreal, Site: Right Eye, Waste: 0 mg  Post-op Post injection exam found visual acuity of at least counting fingers. The patient tolerated  the procedure well. There were no complications. The patient received written and verbal post procedure care education. Post injection medications were not given.                 ASSESSMENT/PLAN:  Exudative age-related macular degeneration of right eye with active choroidal neovascularization (HCC) Repeat intravitreal Avastin OD today currently at 8-week follow-up, repeat examination in 8 weeks right eye  Macular pucker, right eye Moderate severe epiretinal membrane in association with vitreal macular adhesion with traction right eye yet not the the main factor in acuity as CME from CNVM recurs when antivegF has been halted in the past      ICD-10-CM   1. Exudative age-related macular degeneration of right eye with active choroidal neovascularization (HCC)  H35.3211 OCT, Retina - OU - Both Eyes    Intravitreal Injection, Pharmacologic Agent - OD - Right Eye    Bevacizumab (AVASTIN) SOLN 1.25 mg  2. Macular pucker, right eye  H35.371     1.  2.  3.  Ophthalmic Meds Ordered this visit:  Meds ordered this encounter  Medications  . Bevacizumab (AVASTIN) SOLN 1.25 mg       Return in about 8 weeks (around 04/07/2020) for dilate, OD, AVASTIN OCT.  There are no Patient Instructions on file for this visit.   Explained the diagnoses, plan, and follow up with the patient and they expressed understanding.  Patient expressed understanding of the importance of proper follow up care.   Clent Demark Zakariah Dejarnette M.D.  Diseases & Surgery of the Retina and Vitreous Retina & Diabetic [place] 02/11/20     Abbreviations: M myopia (nearsighted); A astigmatism; H hyperopia (farsighted); P presbyopia; Mrx spectacle prescription;  CTL contact lenses; OD right eye; OS left eye; OU both eyes  XT exotropia; ET esotropia; PEK punctate epithelial keratitis; PEE punctate epithelial erosions; DES dry eye syndrome; MGD meibomian gland dysfunction; ATs artificial tears; PFAT's preservative free artificial  tears; [place] nuclear sclerotic cataract; PSC posterior subcapsular cataract; ERM epi-retinal membrane; PVD posterior vitreous detachment; RD retinal detachment; DM diabetes mellitus; DR diabetic retinopathy; NPDR non-proliferative diabetic retinopathy; PDR proliferative diabetic retinopathy; CSME clinically significant macular edema; DME diabetic macular edema; dbh dot blot hemorrhages; CWS cotton wool spot; POAG primary open angle glaucoma; C/D cup-to-disc ratio; HVF humphrey visual field; GVF goldmann visual field; OCT optical coherence tomography; IOP intraocular pressure; BRVO Branch retinal vein occlusion; CRVO central retinal vein occlusion; CRAO central retinal artery occlusion; BRAO branch retinal artery occlusion; RT retinal tear; SB scleral buckle; PPV pars plana vitrectomy; VH Vitreous hemorrhage; PRP panretinal laser photocoagulation; IVK intravitreal kenalog; VMT vitreomacular traction; MH Macular hole;  NVD neovascularization of the disc; NVE neovascularization elsewhere; AREDS age related eye disease study; ARMD age related macular degeneration; POAG primary open angle glaucoma; EBMD epithelial/anterior basement membrane dystrophy; ACIOL anterior chamber intraocular lens; IOL intraocular lens; PCIOL posterior chamber intraocular lens; Phaco/IOL phacoemulsification with intraocular lens placement; [place] photorefractive keratectomy; LASIK laser assisted in situ keratomileusis; HTN hypertension; DM diabetes mellitus; COPD chronic obstructive pulmonary disease

## 2020-02-11 NOTE — Assessment & Plan Note (Signed)
Moderate severe epiretinal membrane in association with vitreal macular adhesion with traction right eye yet not the the main factor in acuity as CME from CNVM recurs when antivegF has been halted in the past

## 2020-02-11 NOTE — Assessment & Plan Note (Signed)
Repeat intravitreal Avastin OD today currently at 8-week follow-up, repeat examination in 8 weeks right eye

## 2020-02-16 DIAGNOSIS — I4819 Other persistent atrial fibrillation: Secondary | ICD-10-CM | POA: Diagnosis not present

## 2020-02-16 DIAGNOSIS — M5116 Intervertebral disc disorders with radiculopathy, lumbar region: Secondary | ICD-10-CM | POA: Diagnosis not present

## 2020-02-16 DIAGNOSIS — M1909 Primary osteoarthritis, other specified site: Secondary | ICD-10-CM | POA: Diagnosis not present

## 2020-02-16 DIAGNOSIS — M479 Spondylosis, unspecified: Secondary | ICD-10-CM | POA: Diagnosis not present

## 2020-02-16 DIAGNOSIS — I2510 Atherosclerotic heart disease of native coronary artery without angina pectoris: Secondary | ICD-10-CM | POA: Diagnosis not present

## 2020-02-16 DIAGNOSIS — M170 Bilateral primary osteoarthritis of knee: Secondary | ICD-10-CM | POA: Diagnosis not present

## 2020-03-04 DIAGNOSIS — I2510 Atherosclerotic heart disease of native coronary artery without angina pectoris: Secondary | ICD-10-CM | POA: Diagnosis not present

## 2020-03-04 DIAGNOSIS — I4819 Other persistent atrial fibrillation: Secondary | ICD-10-CM | POA: Diagnosis not present

## 2020-03-04 DIAGNOSIS — M1909 Primary osteoarthritis, other specified site: Secondary | ICD-10-CM | POA: Diagnosis not present

## 2020-03-04 DIAGNOSIS — M170 Bilateral primary osteoarthritis of knee: Secondary | ICD-10-CM | POA: Diagnosis not present

## 2020-03-04 DIAGNOSIS — M479 Spondylosis, unspecified: Secondary | ICD-10-CM | POA: Diagnosis not present

## 2020-03-04 DIAGNOSIS — M5116 Intervertebral disc disorders with radiculopathy, lumbar region: Secondary | ICD-10-CM | POA: Diagnosis not present

## 2020-03-06 DIAGNOSIS — R351 Nocturia: Secondary | ICD-10-CM | POA: Diagnosis not present

## 2020-03-06 DIAGNOSIS — I130 Hypertensive heart and chronic kidney disease with heart failure and stage 1 through stage 4 chronic kidney disease, or unspecified chronic kidney disease: Secondary | ICD-10-CM | POA: Diagnosis not present

## 2020-03-06 DIAGNOSIS — N401 Enlarged prostate with lower urinary tract symptoms: Secondary | ICD-10-CM | POA: Diagnosis not present

## 2020-03-06 DIAGNOSIS — N184 Chronic kidney disease, stage 4 (severe): Secondary | ICD-10-CM | POA: Diagnosis not present

## 2020-03-06 DIAGNOSIS — M1909 Primary osteoarthritis, other specified site: Secondary | ICD-10-CM | POA: Diagnosis not present

## 2020-03-06 DIAGNOSIS — D509 Iron deficiency anemia, unspecified: Secondary | ICD-10-CM | POA: Diagnosis not present

## 2020-03-06 DIAGNOSIS — I083 Combined rheumatic disorders of mitral, aortic and tricuspid valves: Secondary | ICD-10-CM | POA: Diagnosis not present

## 2020-03-06 DIAGNOSIS — I2510 Atherosclerotic heart disease of native coronary artery without angina pectoris: Secondary | ICD-10-CM | POA: Diagnosis not present

## 2020-03-06 DIAGNOSIS — I4819 Other persistent atrial fibrillation: Secondary | ICD-10-CM | POA: Diagnosis not present

## 2020-03-06 DIAGNOSIS — D472 Monoclonal gammopathy: Secondary | ICD-10-CM | POA: Diagnosis not present

## 2020-03-06 DIAGNOSIS — H35371 Puckering of macula, right eye: Secondary | ICD-10-CM | POA: Diagnosis not present

## 2020-03-06 DIAGNOSIS — E1122 Type 2 diabetes mellitus with diabetic chronic kidney disease: Secondary | ICD-10-CM | POA: Diagnosis not present

## 2020-03-06 DIAGNOSIS — M170 Bilateral primary osteoarthritis of knee: Secondary | ICD-10-CM | POA: Diagnosis not present

## 2020-03-06 DIAGNOSIS — D539 Nutritional anemia, unspecified: Secondary | ICD-10-CM | POA: Diagnosis not present

## 2020-03-06 DIAGNOSIS — K5790 Diverticulosis of intestine, part unspecified, without perforation or abscess without bleeding: Secondary | ICD-10-CM | POA: Diagnosis not present

## 2020-03-06 DIAGNOSIS — D631 Anemia in chronic kidney disease: Secondary | ICD-10-CM | POA: Diagnosis not present

## 2020-03-06 DIAGNOSIS — M479 Spondylosis, unspecified: Secondary | ICD-10-CM | POA: Diagnosis not present

## 2020-03-06 DIAGNOSIS — E785 Hyperlipidemia, unspecified: Secondary | ICD-10-CM | POA: Diagnosis not present

## 2020-03-06 DIAGNOSIS — H409 Unspecified glaucoma: Secondary | ICD-10-CM | POA: Diagnosis not present

## 2020-03-06 DIAGNOSIS — H353231 Exudative age-related macular degeneration, bilateral, with active choroidal neovascularization: Secondary | ICD-10-CM | POA: Diagnosis not present

## 2020-03-06 DIAGNOSIS — I0981 Rheumatic heart failure: Secondary | ICD-10-CM | POA: Diagnosis not present

## 2020-03-06 DIAGNOSIS — I700 Atherosclerosis of aorta: Secondary | ICD-10-CM | POA: Diagnosis not present

## 2020-03-06 DIAGNOSIS — M5116 Intervertebral disc disorders with radiculopathy, lumbar region: Secondary | ICD-10-CM | POA: Diagnosis not present

## 2020-03-06 DIAGNOSIS — D630 Anemia in neoplastic disease: Secondary | ICD-10-CM | POA: Diagnosis not present

## 2020-03-06 DIAGNOSIS — I5033 Acute on chronic diastolic (congestive) heart failure: Secondary | ICD-10-CM | POA: Diagnosis not present

## 2020-03-08 DIAGNOSIS — M479 Spondylosis, unspecified: Secondary | ICD-10-CM | POA: Diagnosis not present

## 2020-03-08 DIAGNOSIS — M1909 Primary osteoarthritis, other specified site: Secondary | ICD-10-CM | POA: Diagnosis not present

## 2020-03-08 DIAGNOSIS — I2510 Atherosclerotic heart disease of native coronary artery without angina pectoris: Secondary | ICD-10-CM | POA: Diagnosis not present

## 2020-03-08 DIAGNOSIS — M5116 Intervertebral disc disorders with radiculopathy, lumbar region: Secondary | ICD-10-CM | POA: Diagnosis not present

## 2020-03-08 DIAGNOSIS — M170 Bilateral primary osteoarthritis of knee: Secondary | ICD-10-CM | POA: Diagnosis not present

## 2020-03-08 DIAGNOSIS — I4819 Other persistent atrial fibrillation: Secondary | ICD-10-CM | POA: Diagnosis not present

## 2020-03-11 DIAGNOSIS — M170 Bilateral primary osteoarthritis of knee: Secondary | ICD-10-CM | POA: Diagnosis not present

## 2020-03-11 DIAGNOSIS — M1909 Primary osteoarthritis, other specified site: Secondary | ICD-10-CM | POA: Diagnosis not present

## 2020-03-11 DIAGNOSIS — I2510 Atherosclerotic heart disease of native coronary artery without angina pectoris: Secondary | ICD-10-CM | POA: Diagnosis not present

## 2020-03-11 DIAGNOSIS — M5116 Intervertebral disc disorders with radiculopathy, lumbar region: Secondary | ICD-10-CM | POA: Diagnosis not present

## 2020-03-11 DIAGNOSIS — I4819 Other persistent atrial fibrillation: Secondary | ICD-10-CM | POA: Diagnosis not present

## 2020-03-11 DIAGNOSIS — M479 Spondylosis, unspecified: Secondary | ICD-10-CM | POA: Diagnosis not present

## 2020-03-17 ENCOUNTER — Encounter (INDEPENDENT_AMBULATORY_CARE_PROVIDER_SITE_OTHER): Payer: Self-pay | Admitting: Ophthalmology

## 2020-03-17 ENCOUNTER — Ambulatory Visit (INDEPENDENT_AMBULATORY_CARE_PROVIDER_SITE_OTHER): Payer: Medicare Other | Admitting: Ophthalmology

## 2020-03-17 ENCOUNTER — Other Ambulatory Visit: Payer: Self-pay

## 2020-03-17 DIAGNOSIS — H35371 Puckering of macula, right eye: Secondary | ICD-10-CM

## 2020-03-17 DIAGNOSIS — H353221 Exudative age-related macular degeneration, left eye, with active choroidal neovascularization: Secondary | ICD-10-CM | POA: Diagnosis not present

## 2020-03-17 DIAGNOSIS — H353123 Nonexudative age-related macular degeneration, left eye, advanced atrophic without subfoveal involvement: Secondary | ICD-10-CM | POA: Insufficient documentation

## 2020-03-17 MED ORDER — AFLIBERCEPT 2MG/0.05ML IZ SOLN FOR KALEIDOSCOPE
2.0000 mg | INTRAVITREAL | Status: AC | PRN
  Administered 2020-03-17: 11:00:00 2 mg via INTRAVITREAL

## 2020-03-17 NOTE — Progress Notes (Signed)
03/17/2020     CHIEF COMPLAINT Patient presents for Retina Follow Up   HISTORY OF PRESENT ILLNESS: Adam [name] is a 86 y.o. male who presents to the clinic today for:   HPI    Retina Follow Up    Patient presents with  Wet AMD.  In left eye.  Severity is moderate.  Duration of 8 weeks.  Since onset it is stable.  I, the attending physician,  performed the HPI with the patient and updated documentation appropriately.          Comments    8 Week Wet AMD f\u OS. Possible Eylea OS. OCT  Pt states he has a hard time reading. Pt is now being seen by the [facility] for glaucoma evaluations. Using gtts as directed.        Last edited by Tilda Franco on 03/17/2020 10:39 AM. (History)      Referring physician: Javier Glazier, MD [place],  [place] [address]  HISTORICAL INFORMATION:   Selected notes from the MEDICAL RECORD NUMBER    Lab Results  Component Value Date   HGBA1C 5.7 (H) 08/03/2019     CURRENT MEDICATIONS: Current Outpatient Medications (Ophthalmic Drugs)  Medication Sig  . brimonidine-timolol (COMBIGAN) 0.2-0.5 % ophthalmic solution Place 1 drop into the left eye 2 (two) times daily.   . Netarsudil-Latanoprost (ROCKLATAN) 0.02-0.005 % SOLN Place 1 drop into the left eye at bedtime.    No current facility-administered medications for this visit. (Ophthalmic Drugs)   Current Outpatient Medications (Other)  Medication Sig  . acetaminophen (TYLENOL) 500 MG tablet Take 500 mg by mouth daily as needed for headache (pain).  Marland Kitchen amiodarone (PACERONE) 100 MG tablet Take 100 mg by mouth daily.  Marland Kitchen apixaban (ELIQUIS) 2.5 MG TABS tablet Take 1 tablet (2.5 mg total) by mouth 2 (two) times daily.  . Ascorbic Acid (VITAMIN C) 500 MG CAPS Take 500 mg by mouth 2 (two) times daily.  Marland Kitchen atorvastatin (LIPITOR) 20 MG tablet Take 1 tablet (20 mg total) by mouth daily.  . betamethasone dipropionate (DIPROLENE) 0.05 % ointment Apply 1 application topically daily as needed  (itching/rash).   Marland Kitchen Bioflavonoid Products (ESTER C PO) Take 1 tablet by mouth 2 (two) times daily.  . clobetasol cream (TEMOVATE) 8.14 % Apply 1 application topically daily as needed (itching/rash).   . febuxostat (ULORIC) 40 MG tablet Take 40 mg by mouth See admin instructions. Take one tablet (40 mg) by mouth Tuesday, Friday, Sunday evening  . ferrous sulfate 324 MG TBEC Take 324 mg by mouth daily with breakfast.  . finasteride (PROSCAR) 5 MG tablet Take 5 mg by mouth daily.   . furosemide (LASIX) 40 MG tablet Take 40 mg by mouth. 80mg  in the AM; 40mg  in the PM  . gabapentin (NEURONTIN) 100 MG capsule Take 100 mg by mouth 3 (three) times daily.   . hydrALAZINE (APRESOLINE) 100 MG tablet Take 1 tablet (100 mg total) by mouth every 8 (eight) hours.  . methylPREDNISolone (MEDROL DOSEPAK) 4 MG TBPK tablet Use as directed  . Multiple Vitamins-Minerals (PRESERVISION AREDS PO) Take 1 capsule by mouth 2 (two) times daily.   Marland Kitchen omeprazole (PRILOSEC OTC) 20 MG tablet Take 20 mg by mouth daily with lunch.   . traMADol (ULTRAM) 50 MG tablet Take 1 tablet (50 mg total) by mouth every 6 (six) hours as needed for up to 12 doses.   No current facility-administered medications for this visit. (Other)  REVIEW OF SYSTEMS:    ALLERGIES No Known Allergies  PAST MEDICAL HISTORY Past Medical History:  Diagnosis Date  . Acute meniscal tear of knee RIGHT KNEE  . Arthritis   . BPH (benign prostatic hypertrophy)   . Colon polyps    hyperplastic  . Diabetes mellitus type 2, diet-controlled (HCC) AND EXERCISE-- LAST A1C  5.6  . Diverticulosis   . Gait abnormality 11/08/2016  . GERD (gastroesophageal reflux disease)   . Glaucoma   . H/O hiatal hernia   . Heart murmur   . History of esophageal stricture S/P DILATION IN 2007  . Hyperlipidemia   . Hypertension   . IgM monoclonal gammopathy of uncertain significance ASYMPTOMATIC    FOLLOWED BY DR Alen Blew  . Internal hemorrhoids   . Macrocytic anemia  DUE TO CHRONIC RENAL INSUFF.  . Nocturia   . Persistent atrial fibrillation ([place])   . Right knee pain    Past Surgical History:  Procedure Laterality Date  . CATARACT EXTRACTION W/ INTRAOCULAR LENS  IMPLANT, BILATERAL  2012  . HEMORRHOID SURGERY    . HERNIA REPAIR    . KNEE ARTHROSCOPY  12/25/2011   Procedure: ARTHROSCOPY KNEE;  Surgeon: Tobi Bastos, MD;  Location: [facility];  Service: Orthopedics;  Laterality: Right;  right knee arthroscopy with medial menisectomy      FAMILY HISTORY Family History  Problem Relation Age of Onset  . Diabetes Brother   . Heart attack Father   . Early death Father   . Hearing loss Father   . Heart disease Father   . Alcohol abuse Sister   . Cancer Sister     SOCIAL HISTORY Social History   Tobacco Use  . Smoking status: Former Smoker    Packs/day: 1.00    Years: 10.00    Pack years: 10.00    Types: Cigarettes  . Smokeless tobacco: Never Used  Substance Use Topics  . Alcohol use: Yes    Alcohol/week: 14.0 standard drinks    Types: 14 Standard drinks or equivalent per week  . Drug use: No         OPHTHALMIC EXAM:  Base Eye Exam    Visual Acuity (Snellen - Linear)      Right Left   Dist [place] 20/80 + 20/40 -1   Dist ph [place] 20/60 -2 20/30 -1       Tonometry (Tonopen, 10:45 AM)      Right Left   Pressure 9 6       Pupils      Pupils Dark Light Shape React APD   Right PERRL 4 3 Round Brisk None   Left PERRL 3 2 Round Brisk +1       Visual Fields (Counting fingers)      Left Right   Restrictions Partial outer inferior nasal deficiency        Neuro/Psych    Oriented x3: Yes   Mood/Affect: Normal       Dilation    Left eye: 1.0% Mydriacyl, 2.5% Phenylephrine @ 10:45 AM        Slit Lamp and Fundus Exam    External Exam      Right Left   External Normal Normal       Slit Lamp Exam      Right Left   Lids/Lashes Normal Normal   Conjunctiva/Sclera White and quiet White and quiet   Cornea  Clear Clear   Anterior Chamber Deep and quiet Deep and quiet  Iris Round and reactive Round and reactive   Lens Posterior chamber intraocular lens Posterior chamber intraocular lens   Anterior Vitreous Normal Normal       Fundus Exam      Right Left   Posterior Vitreous  Posterior vitreous detachment   Disc  Pallor 1+   C/D Ratio  0.5   Macula  Atrophy, Age related macular degeneration, Early age related macular degeneration, Macular thickening improved, Disciform scar, Geographic atrophy, Epiretinal membrane   Vessels  Normal   Periphery  Normal          IMAGING AND PROCEDURES  Imaging and Procedures for 03/17/20  OCT, Retina - OU - Both Eyes       Right Eye Quality was borderline. Scan locations included subfoveal. Central Foveal Thickness: 354. Progression has improved. Findings include vitreous traction, outer retinal atrophy, epiretinal membrane, macular pucker, vitreomacular adhesion .   Left Eye Quality was good. Scan locations included subfoveal. Central Foveal Thickness: 292. Progression has improved. Findings include central retinal atrophy, outer retinal atrophy, inner retinal atrophy.   Notes Less active CME from CNVM OD currently at 5-week follow-up on Avastin.  Will maintain current interval of 8-week follow-up for the right eye  OS examination today discloses much less intraretinal fluid, status post Eylea injection some 8 weeks previous.  These photos were disclosed and reviewed with the patient  We will repeat injection today and is emanation left eye in 8 weeks            Intravitreal Injection, Pharmacologic Agent - OS - Left Eye       Time Out 03/17/2020. 11:18 AM. Confirmed correct patient, procedure, site, and patient consented.   Anesthesia Topical anesthesia was used.   Procedure Preparation included 10% betadine to eyelids, 5% betadine to ocular surface, Ofloxacin . A 30 gauge needle was used.   Injection:  2 mg aflibercept  Alfonse Flavors) SOLN   NDC: A3590391, Lot: [id number]   Route: Intravitreal, Site: Left Eye, Waste: 0 mg  Post-op Post injection exam found visual acuity of at least counting fingers. The patient tolerated the procedure well. There were no complications. The patient received written and verbal post procedure care education. Post injection medications were not given.                 ASSESSMENT/PLAN:  Exudative age-related macular degeneration of left eye with active choroidal neovascularization ([place]) OCT vastly improved with much less intraretinal fluid today as compared to prior examination dated 01-20-20.  We will repeat injection today and examination again in 8 weeks.  Macular pucker, right eye No progression OD by OCT  Advanced nonexudative age-related macular degeneration of left eye without subfoveal involvement The nature of dry age related macular degeneration was discussed with the patient as well as its possible conversion to wet. The results of the AREDS 2 study was discussed with the patient. A diet rich in dark leafy green vegetables was advised and specific recommendations were made regarding supplements with AREDS 2 formulation . Control of hypertension and serum cholesterol may slow the disease. Smoking cessation is mandatory to slow the disease and diminish the risk of progressing to wet age related macular degeneration. The patient was instructed in the use of an [place] and was told to return immediately for any changes in the Grid. Stressed to the patient do not rub eyes.. Accounts for acuity OS      ICD-10-CM   1. Exudative age-related macular degeneration of left eye  with active choroidal neovascularization ([place])  H35.3221 OCT, Retina - OU - Both Eyes    Intravitreal Injection, Pharmacologic Agent - OS - Left Eye    aflibercept (EYLEA) SOLN 2 mg  2. Macular pucker, right eye  H35.371   3. Advanced nonexudative age-related macular degeneration of left eye without  subfoveal involvement  H35.3123     1.  Macular findings left eye much improved at 8-week follow-up post intravitreal Eylea.  We will repeat injection today and exam in 8 weeks left eye  2.  OD follow-up as scheduled  3.  Ophthalmic Meds Ordered this visit:  Meds ordered this encounter  Medications  . aflibercept (EYLEA) SOLN 2 mg       Return in about 8 weeks (around 05/12/2020) for dilate, OS, EYLEA OCT.  There are no Patient Instructions on file for this visit.   Explained the diagnoses, plan, and follow up with the patient and they expressed understanding.  Patient expressed understanding of the importance of proper follow up care.   Clent Demark Brittony Billick M.D. Diseases & Surgery of the Retina and Vitreous Retina & Diabetic [place] 03/17/20     Abbreviations: M myopia (nearsighted); A astigmatism; H hyperopia (farsighted); P presbyopia; Mrx spectacle prescription;  CTL contact lenses; OD right eye; OS left eye; OU both eyes  XT exotropia; ET esotropia; PEK punctate epithelial keratitis; PEE punctate epithelial erosions; DES dry eye syndrome; MGD meibomian gland dysfunction; ATs artificial tears; PFAT's preservative free artificial tears; [place] nuclear sclerotic cataract; PSC posterior subcapsular cataract; ERM epi-retinal membrane; PVD posterior vitreous detachment; RD retinal detachment; DM diabetes mellitus; DR diabetic retinopathy; NPDR non-proliferative diabetic retinopathy; PDR proliferative diabetic retinopathy; CSME clinically significant macular edema; DME diabetic macular edema; dbh dot blot hemorrhages; CWS cotton wool spot; POAG primary open angle glaucoma; C/D cup-to-disc ratio; HVF humphrey visual field; GVF goldmann visual field; OCT optical coherence tomography; IOP intraocular pressure; BRVO Branch retinal vein occlusion; CRVO central retinal vein occlusion; CRAO central retinal artery occlusion; BRAO branch retinal artery occlusion; RT retinal tear; SB scleral buckle;  PPV pars plana vitrectomy; VH Vitreous hemorrhage; PRP panretinal laser photocoagulation; IVK intravitreal kenalog; VMT vitreomacular traction; MH Macular hole;  NVD neovascularization of the disc; NVE neovascularization elsewhere; AREDS age related eye disease study; ARMD age related macular degeneration; POAG primary open angle glaucoma; EBMD epithelial/anterior basement membrane dystrophy; ACIOL anterior chamber intraocular lens; IOL intraocular lens; PCIOL posterior chamber intraocular lens; Phaco/IOL phacoemulsification with intraocular lens placement; [place] photorefractive keratectomy; LASIK laser assisted in situ keratomileusis; HTN hypertension; DM diabetes mellitus; COPD chronic obstructive pulmonary disease

## 2020-03-17 NOTE — Assessment & Plan Note (Signed)
OCT vastly improved with much less intraretinal fluid today as compared to prior examination dated 01-20-20.  We will repeat injection today and examination again in 8 weeks.

## 2020-03-17 NOTE — Assessment & Plan Note (Signed)
The nature of dry age related macular degeneration was discussed with the patient as well as its possible conversion to wet. The results of the AREDS 2 study was discussed with the patient. A diet rich in dark leafy green vegetables was advised and specific recommendations were made regarding supplements with AREDS 2 formulation . Control of hypertension and serum cholesterol may slow the disease. Smoking cessation is mandatory to slow the disease and diminish the risk of progressing to wet age related macular degeneration. The patient was instructed in the use of an [place] and was told to return immediately for any changes in the Grid. Stressed to the patient do not rub eyes.. Accounts for acuity OS

## 2020-03-17 NOTE — Assessment & Plan Note (Signed)
No progression OD by OCT

## 2020-03-18 DIAGNOSIS — M170 Bilateral primary osteoarthritis of knee: Secondary | ICD-10-CM | POA: Diagnosis not present

## 2020-03-18 DIAGNOSIS — M5116 Intervertebral disc disorders with radiculopathy, lumbar region: Secondary | ICD-10-CM | POA: Diagnosis not present

## 2020-03-18 DIAGNOSIS — B351 Tinea unguium: Secondary | ICD-10-CM | POA: Diagnosis not present

## 2020-03-18 DIAGNOSIS — I4819 Other persistent atrial fibrillation: Secondary | ICD-10-CM | POA: Diagnosis not present

## 2020-03-18 DIAGNOSIS — M79674 Pain in right toe(s): Secondary | ICD-10-CM | POA: Diagnosis not present

## 2020-03-18 DIAGNOSIS — M1909 Primary osteoarthritis, other specified site: Secondary | ICD-10-CM | POA: Diagnosis not present

## 2020-03-18 DIAGNOSIS — I2510 Atherosclerotic heart disease of native coronary artery without angina pectoris: Secondary | ICD-10-CM | POA: Diagnosis not present

## 2020-03-18 DIAGNOSIS — M79675 Pain in left toe(s): Secondary | ICD-10-CM | POA: Diagnosis not present

## 2020-03-18 DIAGNOSIS — M479 Spondylosis, unspecified: Secondary | ICD-10-CM | POA: Diagnosis not present

## 2020-03-21 DIAGNOSIS — M5116 Intervertebral disc disorders with radiculopathy, lumbar region: Secondary | ICD-10-CM | POA: Diagnosis not present

## 2020-03-21 DIAGNOSIS — I2510 Atherosclerotic heart disease of native coronary artery without angina pectoris: Secondary | ICD-10-CM | POA: Diagnosis not present

## 2020-03-21 DIAGNOSIS — M170 Bilateral primary osteoarthritis of knee: Secondary | ICD-10-CM | POA: Diagnosis not present

## 2020-03-21 DIAGNOSIS — M1909 Primary osteoarthritis, other specified site: Secondary | ICD-10-CM | POA: Diagnosis not present

## 2020-03-21 DIAGNOSIS — M479 Spondylosis, unspecified: Secondary | ICD-10-CM | POA: Diagnosis not present

## 2020-03-21 DIAGNOSIS — I4819 Other persistent atrial fibrillation: Secondary | ICD-10-CM | POA: Diagnosis not present

## 2020-03-25 DIAGNOSIS — M5116 Intervertebral disc disorders with radiculopathy, lumbar region: Secondary | ICD-10-CM | POA: Diagnosis not present

## 2020-03-25 DIAGNOSIS — M170 Bilateral primary osteoarthritis of knee: Secondary | ICD-10-CM | POA: Diagnosis not present

## 2020-03-25 DIAGNOSIS — I4819 Other persistent atrial fibrillation: Secondary | ICD-10-CM | POA: Diagnosis not present

## 2020-03-25 DIAGNOSIS — I2510 Atherosclerotic heart disease of native coronary artery without angina pectoris: Secondary | ICD-10-CM | POA: Diagnosis not present

## 2020-03-25 DIAGNOSIS — M1909 Primary osteoarthritis, other specified site: Secondary | ICD-10-CM | POA: Diagnosis not present

## 2020-03-25 DIAGNOSIS — M479 Spondylosis, unspecified: Secondary | ICD-10-CM | POA: Diagnosis not present

## 2020-04-07 ENCOUNTER — Ambulatory Visit (INDEPENDENT_AMBULATORY_CARE_PROVIDER_SITE_OTHER): Payer: Medicare Other | Admitting: Ophthalmology

## 2020-04-07 ENCOUNTER — Encounter (INDEPENDENT_AMBULATORY_CARE_PROVIDER_SITE_OTHER): Payer: Self-pay | Admitting: Ophthalmology

## 2020-04-07 ENCOUNTER — Other Ambulatory Visit: Payer: Self-pay

## 2020-04-07 DIAGNOSIS — H353211 Exudative age-related macular degeneration, right eye, with active choroidal neovascularization: Secondary | ICD-10-CM | POA: Diagnosis not present

## 2020-04-07 DIAGNOSIS — H353221 Exudative age-related macular degeneration, left eye, with active choroidal neovascularization: Secondary | ICD-10-CM

## 2020-04-07 MED ORDER — BEVACIZUMAB 2.5 MG/0.1ML IZ SOSY
2.5000 mg | PREFILLED_SYRINGE | INTRAVITREAL | Status: AC | PRN
  Administered 2020-04-07: 2.5 mg via INTRAVITREAL

## 2020-04-07 NOTE — Progress Notes (Signed)
04/07/2020     CHIEF COMPLAINT Patient presents for Retina Follow Up (8 Week AMD F/U OD, poss Avastin OD//Pt denies noticeable changes to [place] OU since last visit. Pt denies ocular pain, flashes of light, or floaters OU. //)   HISTORY OF PRESENT ILLNESS: Adam [name] is a 87 y.o. male who presents to the clinic today for:   HPI    Retina Follow Up    Patient presents with  Wet AMD.  In right eye.  This started 8 weeks ago.  Severity is mild.  Duration of 8 weeks.  Since onset it is stable. Additional comments: 8 Week AMD F/U OD, poss Avastin OD  Pt denies noticeable changes to [place] OU since last visit. Pt denies ocular pain, flashes of light, or floaters OU.          Last edited by Rockie Neighbours, [place] on 04/07/2020  1:45 PM. (History)      Referring physician: Javier Glazier, MD [place],  [place] [address]  HISTORICAL INFORMATION:   Selected notes from the MEDICAL RECORD NUMBER    Lab Results  Component Value Date   HGBA1C 5.7 (H) 08/03/2019     CURRENT MEDICATIONS: Current Outpatient Medications (Ophthalmic Drugs)  Medication Sig  . brimonidine-timolol (COMBIGAN) 0.2-0.5 % ophthalmic solution Place 1 drop into the left eye 2 (two) times daily.   . Netarsudil-Latanoprost 0.02-0.005 % SOLN Place 1 drop into the left eye at bedtime.    No current facility-administered medications for this visit. (Ophthalmic Drugs)   Current Outpatient Medications (Other)  Medication Sig  . acetaminophen (TYLENOL) 500 MG tablet Take 500 mg by mouth daily as needed for headache (pain).  Marland Kitchen amiodarone (PACERONE) 100 MG tablet Take 100 mg by mouth daily.  Marland Kitchen apixaban (ELIQUIS) 2.5 MG TABS tablet Take 1 tablet (2.5 mg total) by mouth 2 (two) times daily.  . Ascorbic Acid (VITAMIN C) 500 MG CAPS Take 500 mg by mouth 2 (two) times daily.  Marland Kitchen atorvastatin (LIPITOR) 20 MG tablet Take 1 tablet (20 mg total) by mouth daily.  . betamethasone dipropionate (DIPROLENE) 0.05 % ointment Apply 1  application topically daily as needed (itching/rash).   Marland Kitchen Bioflavonoid Products (ESTER C PO) Take 1 tablet by mouth 2 (two) times daily.  . clobetasol cream (TEMOVATE) 3.64 % Apply 1 application topically daily as needed (itching/rash).   . febuxostat (ULORIC) 40 MG tablet Take 40 mg by mouth See admin instructions. Take one tablet (40 mg) by mouth Tuesday, Friday, Sunday evening  . ferrous sulfate 324 MG TBEC Take 324 mg by mouth daily with breakfast.  . finasteride (PROSCAR) 5 MG tablet Take 5 mg by mouth daily.   . furosemide (LASIX) 40 MG tablet Take 40 mg by mouth. 80mg  in the AM; 40mg  in the PM  . gabapentin (NEURONTIN) 100 MG capsule Take 100 mg by mouth 3 (three) times daily.   . hydrALAZINE (APRESOLINE) 100 MG tablet Take 1 tablet (100 mg total) by mouth every 8 (eight) hours.  . methylPREDNISolone (MEDROL DOSEPAK) 4 MG TBPK tablet Use as directed  . Multiple Vitamins-Minerals (PRESERVISION AREDS PO) Take 1 capsule by mouth 2 (two) times daily.   Marland Kitchen omeprazole (PRILOSEC OTC) 20 MG tablet Take 20 mg by mouth daily with lunch.   . traMADol (ULTRAM) 50 MG tablet Take 1 tablet (50 mg total) by mouth every 6 (six) hours as needed for up to 12 doses.   No current facility-administered medications for this  visit. (Other)      REVIEW OF SYSTEMS:    ALLERGIES No Known Allergies  PAST MEDICAL HISTORY Past Medical History:  Diagnosis Date  . Acute meniscal tear of knee RIGHT KNEE  . Arthritis   . BPH (benign prostatic hypertrophy)   . Colon polyps    hyperplastic  . Diabetes mellitus type 2, diet-controlled (HCC) AND EXERCISE-- LAST A1C  5.6  . Diverticulosis   . Gait abnormality 11/08/2016  . GERD (gastroesophageal reflux disease)   . Glaucoma   . H/O hiatal hernia   . Heart murmur   . History of esophageal stricture S/P DILATION IN 2007  . Hyperlipidemia   . Hypertension   . IgM monoclonal gammopathy of uncertain significance ASYMPTOMATIC    FOLLOWED BY DR Alen Blew  .  Internal hemorrhoids   . Macrocytic anemia DUE TO CHRONIC RENAL INSUFF.  . Nocturia   . Persistent atrial fibrillation ([place])   . Right knee pain    Past Surgical History:  Procedure Laterality Date  . CATARACT EXTRACTION W/ INTRAOCULAR LENS  IMPLANT, BILATERAL  2012  . HEMORRHOID SURGERY    . HERNIA REPAIR    . KNEE ARTHROSCOPY  12/25/2011   Procedure: ARTHROSCOPY KNEE;  Surgeon: Tobi Bastos, MD;  Location: [facility];  Service: Orthopedics;  Laterality: Right;  right knee arthroscopy with medial menisectomy      FAMILY HISTORY Family History  Problem Relation Age of Onset  . Diabetes Brother   . Heart attack Father   . Early death Father   . Hearing loss Father   . Heart disease Father   . Alcohol abuse Sister   . Cancer Sister     SOCIAL HISTORY Social History   Tobacco Use  . Smoking status: Former Smoker    Packs/day: 1.00    Years: 10.00    Pack years: 10.00    Types: Cigarettes  . Smokeless tobacco: Never Used  Substance Use Topics  . Alcohol use: Yes    Alcohol/week: 14.0 standard drinks    Types: 14 Standard drinks or equivalent per week  . Drug use: No         OPHTHALMIC EXAM: Base Eye Exam    Visual Acuity (ETDRS)      Right Left   Dist [place] 20/80 20/40 -1   Dist ph [place] 20/60 +1 20/25 -2       Tonometry (Tonopen, 1:46 PM)      Right Left   Pressure 07 06       Pupils      Dark Light Shape React APD   Right 4 3 Round Brisk None   Left 3 2 Round Brisk Trace       Visual Fields (Counting fingers)      Left Right     Full   Restrictions Partial outer inferior nasal deficiency        Extraocular Movement      Right Left    Full Full       Neuro/Psych    Oriented x3: Yes   Mood/Affect: Normal       Dilation    Right eye: 1.0% Mydriacyl, 2.5% Phenylephrine @ 1:50 PM        Slit Lamp and Fundus Exam    External Exam      Right Left   External Normal Normal       Slit Lamp Exam      Right Left    Lids/Lashes Normal  Normal   Conjunctiva/Sclera White and quiet White and quiet   Cornea Clear Clear   Anterior Chamber Deep and quiet Deep and quiet   Iris Round and reactive Round and reactive   Lens Posterior chamber intraocular lens Posterior chamber intraocular lens   Anterior Vitreous Normal Normal       Fundus Exam      Right Left   Posterior Vitreous Posterior vitreous detachment    Disc Normal    C/D Ratio 0.15    Macula Retinal pigment epithelial mottling, Subretinal neovascular membrane, no hemorrhage, Retinal pigment epithelial atrophy, Macular atrophy, Early age related macular degeneration, no exudates, Epiretinal membrane    Vessels Normal    Periphery Normal           IMAGING AND PROCEDURES  Imaging and Procedures for 04/07/20  OCT, Retina - OU - Both Eyes       Right Eye Quality was good. Scan locations included subfoveal. Central Foveal Thickness: 362. Progression has been stable. Findings include abnormal foveal contour, epiretinal membrane, vitreous traction, cystoid macular edema, outer retinal atrophy, subretinal hyper-reflective material.   Left Eye Quality was good. Scan locations included subfoveal. Central Foveal Thickness: 278. Progression has improved. Findings include no IRF, no SRF, retinal drusen .   Notes OD, overall stable and much less perifoveal CME on intravitreal Avastin today at an interval of 8 weeks.  We will maintain this interval as multiple recurrences in the past OD  OS vastly improved anatomy, follow-up examination OS as scheduled       Intravitreal Injection, Pharmacologic Agent - OD - Right Eye       Time Out 04/07/2020. 2:43 PM. Confirmed correct patient, procedure, site, and patient consented.   Anesthesia Topical anesthesia was used. Anesthetic medications included [place] 3.5%.   Procedure Preparation included 5% betadine to ocular surface, Tobramycin 0.3%, 10% betadine to eyelids. A 30 gauge needle was used.    Injection:  2.5 mg Bevacizumab (AVASTIN) 2.5mg /0.42mL SOSY   NDC: 85027-741-28, Lot: [id number]   Route: Intravitreal, Site: Right Eye  Post-op Post injection exam found visual acuity of at least counting fingers. The patient tolerated the procedure well. There were no complications. The patient received written and verbal post procedure care education. Post injection medications were not given.                 ASSESSMENT/PLAN:  Exudative age-related macular degeneration of right eye with active choroidal neovascularization (HCC) The nature of wet macular degeneration was discussed with the patient.  Forms of therapy reviewed include the use of Anti-VEGF medications injected painlessly into the eye, as well as other possible treatment modalities, including thermal laser therapy. Fellow eye involvement and risks were discussed with the patient. Upon the finding of wet age related macular degeneration, treatment will be offered. The treatment regimen is on a treat as needed basis with the intent to treat if necessary and extend interval of exams when possible. On average 1 out of 6 patients do not need lifetime therapy. However, the risk of recurrent disease is high for a lifetime.  Initially monthly, then periodic, examinations and evaluations will determine whether the next treatment is required on the day of the examination.  Exudative age-related macular degeneration of left eye with active choroidal neovascularization (HCC) OS, stable acuity and improved anatomy overall, follow-up as scheduled      ICD-10-CM   1. Exudative age-related macular degeneration of right eye with active choroidal neovascularization ([place])  H35.3211 OCT, Retina -  OU - Both Eyes    Intravitreal Injection, Pharmacologic Agent - OD - Right Eye    bevacizumab (AVASTIN) SOSY 2.5 mg  2. Exudative age-related macular degeneration of left eye with active choroidal neovascularization ([place])  H35.3221     1.  Stable  acuity, improved anatomy overall, at 8-week follow-up.  On Avastin injection, repeat today and examination OD in 8 weeks  2.  OS follow-up as scheduled dilate next  3.  Ophthalmic Meds Ordered this visit:  Meds ordered this encounter  Medications  . bevacizumab (AVASTIN) SOSY 2.5 mg       Return in about 8 weeks (around 06/02/2020) for dilate, OD, AVASTIN OCT.  There are no Patient Instructions on file for this visit.   Explained the diagnoses, plan, and follow up with the patient and they expressed understanding.  Patient expressed understanding of the importance of proper follow up care.   Clent Demark Gricel Copen M.D. Diseases & Surgery of the Retina and Vitreous [facility] [place] 04/07/20     Abbreviations: M myopia (nearsighted); A astigmatism; H hyperopia (farsighted); P presbyopia; Mrx spectacle prescription;  CTL contact lenses; OD right eye; OS left eye; OU both eyes  XT exotropia; ET esotropia; PEK punctate epithelial keratitis; PEE punctate epithelial erosions; DES dry eye syndrome; MGD meibomian gland dysfunction; ATs artificial tears; PFAT's preservative free artificial tears; [place] nuclear sclerotic cataract; PSC posterior subcapsular cataract; ERM epi-retinal membrane; PVD posterior vitreous detachment; RD retinal detachment; DM diabetes mellitus; DR diabetic retinopathy; NPDR non-proliferative diabetic retinopathy; PDR proliferative diabetic retinopathy; CSME clinically significant macular edema; DME diabetic macular edema; dbh dot blot hemorrhages; CWS cotton wool spot; POAG primary open angle glaucoma; C/D cup-to-disc ratio; HVF humphrey visual field; GVF goldmann visual field; OCT optical coherence tomography; IOP intraocular pressure; BRVO Branch retinal vein occlusion; CRVO central retinal vein occlusion; CRAO central retinal artery occlusion; BRAO branch retinal artery occlusion; RT retinal tear; SB scleral buckle; PPV pars plana vitrectomy; VH Vitreous hemorrhage;  PRP panretinal laser photocoagulation; IVK intravitreal kenalog; VMT vitreomacular traction; MH Macular hole;  NVD neovascularization of the disc; NVE neovascularization elsewhere; AREDS age related eye disease study; ARMD age related macular degeneration; POAG primary open angle glaucoma; EBMD epithelial/anterior basement membrane dystrophy; ACIOL anterior chamber intraocular lens; IOL intraocular lens; PCIOL posterior chamber intraocular lens; Phaco/IOL phacoemulsification with intraocular lens placement; [place] photorefractive keratectomy; LASIK laser assisted in situ keratomileusis; HTN hypertension; DM diabetes mellitus; COPD chronic obstructive pulmonary disease

## 2020-04-07 NOTE — Assessment & Plan Note (Signed)
OS, stable acuity and improved anatomy overall, follow-up as scheduled

## 2020-04-07 NOTE — Assessment & Plan Note (Signed)

## 2020-05-12 ENCOUNTER — Other Ambulatory Visit: Payer: Self-pay

## 2020-05-12 ENCOUNTER — Encounter (INDEPENDENT_AMBULATORY_CARE_PROVIDER_SITE_OTHER): Payer: Self-pay | Admitting: Ophthalmology

## 2020-05-12 ENCOUNTER — Ambulatory Visit (INDEPENDENT_AMBULATORY_CARE_PROVIDER_SITE_OTHER): Payer: Medicare Other | Admitting: Ophthalmology

## 2020-05-12 DIAGNOSIS — H35371 Puckering of macula, right eye: Secondary | ICD-10-CM | POA: Diagnosis not present

## 2020-05-12 DIAGNOSIS — H353211 Exudative age-related macular degeneration, right eye, with active choroidal neovascularization: Secondary | ICD-10-CM | POA: Diagnosis not present

## 2020-05-12 DIAGNOSIS — H353221 Exudative age-related macular degeneration, left eye, with active choroidal neovascularization: Secondary | ICD-10-CM | POA: Diagnosis not present

## 2020-05-12 DIAGNOSIS — H43821 Vitreomacular adhesion, right eye: Secondary | ICD-10-CM

## 2020-05-12 MED ORDER — AFLIBERCEPT 2MG/0.05ML IZ SOLN FOR KALEIDOSCOPE
2.0000 mg | INTRAVITREAL | Status: AC | PRN
  Administered 2020-05-12: 2 mg via INTRAVITREAL

## 2020-05-12 NOTE — Progress Notes (Signed)
05/12/2020     CHIEF COMPLAINT Patient presents for Retina Follow Up (8 WK FU OS, POSS EYLEA OS ///Pt reports stable vision OS. Pt denies any new F/F, pain, or pressure OS. )   HISTORY OF PRESENT ILLNESS: Aurthur [name] is a 87 y.o. male who presents to the clinic today for:   HPI    Retina Follow Up    Patient presents with  Wet AMD.  In left eye.  This started 8 weeks ago.  Duration of 8 weeks.  Since onset it is stable. Additional comments: 8 WK FU OS, POSS EYLEA OS    Pt reports stable vision OS. Pt denies any new F/F, pain, or pressure OS.        Last edited by [name] on 05/12/2020 10:33 AM. (History)      Referring physician: Javier Glazier, MD [place],  [place] [address]  HISTORICAL INFORMATION:   Selected notes from the MEDICAL RECORD NUMBER    Lab Results  Component Value Date   HGBA1C 5.7 (H) 08/03/2019     CURRENT MEDICATIONS: Current Outpatient Medications (Ophthalmic Drugs)  Medication Sig  . brimonidine-timolol (COMBIGAN) 0.2-0.5 % ophthalmic solution Place 1 drop into the left eye 2 (two) times daily.   . Netarsudil-Latanoprost 0.02-0.005 % SOLN Place 1 drop into the left eye at bedtime.    No current facility-administered medications for this visit. (Ophthalmic Drugs)   Current Outpatient Medications (Other)  Medication Sig  . acetaminophen (TYLENOL) 500 MG tablet Take 500 mg by mouth daily as needed for headache (pain).  Marland Kitchen amiodarone (PACERONE) 100 MG tablet Take 100 mg by mouth daily.  Marland Kitchen apixaban (ELIQUIS) 2.5 MG TABS tablet Take 1 tablet (2.5 mg total) by mouth 2 (two) times daily.  . Ascorbic Acid (VITAMIN C) 500 MG CAPS Take 500 mg by mouth 2 (two) times daily.  Marland Kitchen atorvastatin (LIPITOR) 20 MG tablet Take 1 tablet (20 mg total) by mouth daily.  . betamethasone dipropionate (DIPROLENE) 0.05 % ointment Apply 1 application topically daily as needed (itching/rash).   Marland Kitchen Bioflavonoid Products (ESTER C PO) Take 1 tablet by mouth  2 (two) times daily.  . clobetasol cream (TEMOVATE) 5.46 % Apply 1 application topically daily as needed (itching/rash).   . febuxostat (ULORIC) 40 MG tablet Take 40 mg by mouth See admin instructions. Take one tablet (40 mg) by mouth Tuesday, Friday, Sunday evening  . ferrous sulfate 324 MG TBEC Take 324 mg by mouth daily with breakfast.  . finasteride (PROSCAR) 5 MG tablet Take 5 mg by mouth daily.   . furosemide (LASIX) 40 MG tablet Take 40 mg by mouth. 80mg  in the AM; 40mg  in the PM  . gabapentin (NEURONTIN) 100 MG capsule Take 100 mg by mouth 3 (three) times daily.   . hydrALAZINE (APRESOLINE) 100 MG tablet Take 1 tablet (100 mg total) by mouth every 8 (eight) hours.  . methylPREDNISolone (MEDROL DOSEPAK) 4 MG TBPK tablet Use as directed  . Multiple Vitamins-Minerals (PRESERVISION AREDS PO) Take 1 capsule by mouth 2 (two) times daily.   Marland Kitchen omeprazole (PRILOSEC OTC) 20 MG tablet Take 20 mg by mouth daily with lunch.   . traMADol (ULTRAM) 50 MG tablet Take 1 tablet (50 mg total) by mouth every 6 (six) hours as needed for up to 12 doses.   No current facility-administered medications for this visit. (Other)      REVIEW OF SYSTEMS:    ALLERGIES No Known Allergies  PAST MEDICAL HISTORY Past Medical History:  Diagnosis Date  . Acute meniscal tear of knee RIGHT KNEE  . Arthritis   . BPH (benign prostatic hypertrophy)   . Colon polyps    hyperplastic  . Diabetes mellitus type 2, diet-controlled (HCC) AND EXERCISE-- LAST A1C  5.6  . Diverticulosis   . Gait abnormality 11/08/2016  . GERD (gastroesophageal reflux disease)   . Glaucoma   . H/O hiatal hernia   . Heart murmur   . History of esophageal stricture S/P DILATION IN 2007  . Hyperlipidemia   . Hypertension   . IgM monoclonal gammopathy of uncertain significance ASYMPTOMATIC    FOLLOWED BY DR Alen Blew  . Internal hemorrhoids   . Macrocytic anemia DUE TO CHRONIC RENAL INSUFF.  . Nocturia   . Persistent atrial fibrillation  ([place])   . Right knee pain    Past Surgical History:  Procedure Laterality Date  . CATARACT EXTRACTION W/ INTRAOCULAR LENS  IMPLANT, BILATERAL  2012  . HEMORRHOID SURGERY    . HERNIA REPAIR    . KNEE ARTHROSCOPY  12/25/2011   Procedure: ARTHROSCOPY KNEE;  Surgeon: Tobi Bastos, MD;  Location: [facility];  Service: Orthopedics;  Laterality: Right;  right knee arthroscopy with medial menisectomy      FAMILY HISTORY Family History  Problem Relation Age of Onset  . Diabetes Brother   . Heart attack Father   . Early death Father   . Hearing loss Father   . Heart disease Father   . Alcohol abuse Sister   . Cancer Sister     SOCIAL HISTORY Social History   Tobacco Use  . Smoking status: Former Smoker    Packs/day: 1.00    Years: 10.00    Pack years: 10.00    Types: Cigarettes  . Smokeless tobacco: Never Used  Substance Use Topics  . Alcohol use: Yes    Alcohol/week: 14.0 standard drinks    Types: 14 Standard drinks or equivalent per week  . Drug use: No         OPHTHALMIC EXAM: Base Eye Exam    Visual Acuity (ETDRS)      Right Left   Dist [place] 20/60 +2 20/40   Dist ph [place] NI 20/30 -2       Tonometry (Tonopen, 10:42 AM)      Right Left   Pressure 11 8       Pupils      Dark Light Shape React APD   Right 4 3 Round Brisk None   Left 3 2 Round Brisk Trace       Visual Fields (Counting fingers)      Left Right     Full   Restrictions Partial outer inferior nasal deficiency        Extraocular Movement      Right Left    Full Full       Neuro/Psych    Oriented x3: Yes   Mood/Affect: Normal       Dilation    Left eye: 1.0% Mydriacyl, 2.5% Phenylephrine @ 10:42 AM        Slit Lamp and Fundus Exam    External Exam      Right Left   External Normal Normal       Slit Lamp Exam      Right Left   Lids/Lashes Normal Normal   Conjunctiva/Sclera White and quiet White and quiet   Cornea Clear Clear   Anterior Chamber Deep  and quiet  Deep and quiet   Iris Round and reactive Round and reactive   Lens Posterior chamber intraocular lens Posterior chamber intraocular lens   Anterior Vitreous Normal Normal       Fundus Exam      Right Left   Posterior Vitreous  Posterior vitreous detachment   Disc  Pallor 1+   C/D Ratio  0.5   Macula  Atrophy, Age related macular degeneration, Early age related macular degeneration, Macular thickening improved, Disciform scar, Geographic atrophy, Epiretinal membrane   Vessels  Normal   Periphery  Normal          IMAGING AND PROCEDURES  Imaging and Procedures for 05/12/20  OCT, Retina - OU - Both Eyes       Right Eye Quality was good. Scan locations included subfoveal. Central Foveal Thickness: 352. Progression has been stable. Findings include abnormal foveal contour, epiretinal membrane, vitreous traction, cystoid macular edema, outer retinal atrophy, subretinal hyper-reflective material.   Left Eye Quality was good. Scan locations included subfoveal. Central Foveal Thickness: 290. Progression has improved. Findings include no IRF, no SRF, retinal drusen .   Notes OD, overall stable and much less perifoveal CME on intravitreal Avastin today at an interval of 4 weeks.  We will maintain this interval as multiple recurrences in the past OD  OS vastly improved anatomy and stable with no recurrence of subretinal fluid at 8-week follow-up today,, repeat Eylea OS today and examination again in 8 to 9 weeks       Intravitreal Injection, Pharmacologic Agent - OS - Left Eye       Time Out 05/12/2020. 11:58 AM. Confirmed correct patient, procedure, site, and patient consented.   Anesthesia Topical anesthesia was used.   Procedure Preparation included 10% betadine to eyelids, 5% betadine to ocular surface, Ofloxacin . A 30 gauge needle was used.   Injection:  2 mg aflibercept Alfonse Flavors) SOLN   NDC: A3590391, Lot: [id number]   Route: Intravitreal, Site: Left Eye, Waste: 0  mg  Post-op Post injection exam found visual acuity of at least counting fingers. The patient tolerated the procedure well. There were no complications. The patient received written and verbal post procedure care education. Post injection medications were not given.                 ASSESSMENT/PLAN:  Macular pucker, right eye Stable, some chronic damages developing in the right eye  Vitreomacular adhesion of right eye Contributes some to the inner perifoveal CME.  Exudative age-related macular degeneration of left eye with active choroidal neovascularization (HCC) OS, macular condition has stabilized at current 8-week follow-up interval, post Eylea will repeat injection today and examination again in 8 weeks  Exudative age-related macular degeneration of right eye with active choroidal neovascularization ([place]) Incidental note, macular condition OD has improved and stabilized only 4 weeks post injection, follow-up as scheduled      ICD-10-CM   1. Exudative age-related macular degeneration of left eye with active choroidal neovascularization (HCC)  H35.3221 OCT, Retina - OU - Both Eyes    Intravitreal Injection, Pharmacologic Agent - OS - Left Eye    aflibercept (EYLEA) SOLN 2 mg  2. Macular pucker, right eye  H35.371   3. Vitreomacular adhesion of right eye  H43.821   4. Exudative age-related macular degeneration of right eye with active choroidal neovascularization (HCC)  H35.3211     1.  OS, stabilized and improved on intravitreal Eylea currently at 8-week follow-up interval.  With good  acuity will maintain with injection today and examination again left eye at 8 weeks  2.  Will dilate OD next as scheduled  3.  Ophthalmic Meds Ordered this visit:  Meds ordered this encounter  Medications  . aflibercept (EYLEA) SOLN 2 mg       Return in about 8 weeks (around 07/07/2020) for dilate, OS, EYLEA OCT.  There are no Patient Instructions on file for this  visit.   Explained the diagnoses, plan, and follow up with the patient and they expressed understanding.  Patient expressed understanding of the importance of proper follow up care.   Clent Demark Ziah Leandro M.D. Diseases & Surgery of the Retina and Vitreous Retina & Diabetic [place] 05/12/20     Abbreviations: M myopia (nearsighted); A astigmatism; H hyperopia (farsighted); P presbyopia; Mrx spectacle prescription;  CTL contact lenses; OD right eye; OS left eye; OU both eyes  XT exotropia; ET esotropia; PEK punctate epithelial keratitis; PEE punctate epithelial erosions; DES dry eye syndrome; MGD meibomian gland dysfunction; ATs artificial tears; PFAT's preservative free artificial tears; [place] nuclear sclerotic cataract; PSC posterior subcapsular cataract; ERM epi-retinal membrane; PVD posterior vitreous detachment; RD retinal detachment; DM diabetes mellitus; DR diabetic retinopathy; NPDR non-proliferative diabetic retinopathy; PDR proliferative diabetic retinopathy; CSME clinically significant macular edema; DME diabetic macular edema; dbh dot blot hemorrhages; CWS cotton wool spot; POAG primary open angle glaucoma; C/D cup-to-disc ratio; HVF humphrey visual field; GVF goldmann visual field; OCT optical coherence tomography; IOP intraocular pressure; BRVO Branch retinal vein occlusion; CRVO central retinal vein occlusion; CRAO central retinal artery occlusion; BRAO branch retinal artery occlusion; RT retinal tear; SB scleral buckle; PPV pars plana vitrectomy; VH Vitreous hemorrhage; PRP panretinal laser photocoagulation; IVK intravitreal kenalog; VMT vitreomacular traction; MH Macular hole;  NVD neovascularization of the disc; NVE neovascularization elsewhere; AREDS age related eye disease study; ARMD age related macular degeneration; POAG primary open angle glaucoma; EBMD epithelial/anterior basement membrane dystrophy; ACIOL anterior chamber intraocular lens; IOL intraocular lens; PCIOL posterior chamber  intraocular lens; Phaco/IOL phacoemulsification with intraocular lens placement; [place] photorefractive keratectomy; LASIK laser assisted in situ keratomileusis; HTN hypertension; DM diabetes mellitus; COPD chronic obstructive pulmonary disease

## 2020-05-12 NOTE — Assessment & Plan Note (Signed)
Contributes some to the inner perifoveal CME.

## 2020-05-12 NOTE — Assessment & Plan Note (Signed)
OS, macular condition has stabilized at current 8-week follow-up interval, post Eylea will repeat injection today and examination again in 8 weeks

## 2020-05-12 NOTE — Assessment & Plan Note (Signed)
Incidental note, macular condition OD has improved and stabilized only 4 weeks post injection, follow-up as scheduled

## 2020-05-12 NOTE — Assessment & Plan Note (Signed)
Stable, some chronic damages developing in the right eye

## 2020-06-02 ENCOUNTER — Other Ambulatory Visit: Payer: Self-pay

## 2020-06-02 ENCOUNTER — Encounter (INDEPENDENT_AMBULATORY_CARE_PROVIDER_SITE_OTHER): Payer: Self-pay | Admitting: Ophthalmology

## 2020-06-02 ENCOUNTER — Ambulatory Visit (INDEPENDENT_AMBULATORY_CARE_PROVIDER_SITE_OTHER): Payer: Medicare Other | Admitting: Ophthalmology

## 2020-06-02 DIAGNOSIS — H353211 Exudative age-related macular degeneration, right eye, with active choroidal neovascularization: Secondary | ICD-10-CM | POA: Diagnosis not present

## 2020-06-02 MED ORDER — BEVACIZUMAB 2.5 MG/0.1ML IZ SOSY
2.5000 mg | PREFILLED_SYRINGE | INTRAVITREAL | Status: AC | PRN
  Administered 2020-06-02: 2.5 mg via INTRAVITREAL

## 2020-06-02 NOTE — Progress Notes (Signed)
06/02/2020     CHIEF COMPLAINT Patient presents for Retina Follow Up (8 Week Wet AMD f\u OD. Possible Avastin OD. OCT/Pt states he has trouble reading, he has to look to the side to be able to read.)   HISTORY OF PRESENT ILLNESS: Adam [name] is a 87 y.o. male who presents to the clinic today for:   HPI    Retina Follow Up    Patient presents with  Wet AMD.  In right eye.  Severity is moderate.  Duration of 8.  Since onset it is stable. Additional comments: 8 Week Wet AMD f\u OD. Possible Avastin OD. OCT Pt states he has trouble reading, he has to look to the side to be able to read.       Last edited by Tilda Franco on 06/02/2020  1:33 PM. (History)      Referring physician: Javier Glazier, MD [place],  [place] [address]  HISTORICAL INFORMATION:   Selected notes from the MEDICAL RECORD NUMBER    Lab Results  Component Value Date   HGBA1C 5.7 (H) 08/03/2019     CURRENT MEDICATIONS: Current Outpatient Medications (Ophthalmic Drugs)  Medication Sig  . brimonidine-timolol (COMBIGAN) 0.2-0.5 % ophthalmic solution Place 1 drop into the left eye 2 (two) times daily.   . Netarsudil-Latanoprost 0.02-0.005 % SOLN Place 1 drop into the left eye at bedtime.    No current facility-administered medications for this visit. (Ophthalmic Drugs)   Current Outpatient Medications (Other)  Medication Sig  . acetaminophen (TYLENOL) 500 MG tablet Take 500 mg by mouth daily as needed for headache (pain).  Marland Kitchen amiodarone (PACERONE) 100 MG tablet Take 100 mg by mouth daily.  Marland Kitchen apixaban (ELIQUIS) 2.5 MG TABS tablet Take 1 tablet (2.5 mg total) by mouth 2 (two) times daily.  . Ascorbic Acid (VITAMIN C) 500 MG CAPS Take 500 mg by mouth 2 (two) times daily.  Marland Kitchen atorvastatin (LIPITOR) 20 MG tablet Take 1 tablet (20 mg total) by mouth daily.  . betamethasone dipropionate (DIPROLENE) 0.05 % ointment Apply 1 application topically daily as needed (itching/rash).   Marland Kitchen Bioflavonoid Products  (ESTER C PO) Take 1 tablet by mouth 2 (two) times daily.  . clobetasol cream (TEMOVATE) 0.93 % Apply 1 application topically daily as needed (itching/rash).   . febuxostat (ULORIC) 40 MG tablet Take 40 mg by mouth See admin instructions. Take one tablet (40 mg) by mouth Tuesday, Friday, Sunday evening  . ferrous sulfate 324 MG TBEC Take 324 mg by mouth daily with breakfast.  . finasteride (PROSCAR) 5 MG tablet Take 5 mg by mouth daily.   . furosemide (LASIX) 40 MG tablet Take 40 mg by mouth. 80mg  in the AM; 40mg  in the PM  . gabapentin (NEURONTIN) 100 MG capsule Take 100 mg by mouth 3 (three) times daily.   . hydrALAZINE (APRESOLINE) 100 MG tablet Take 1 tablet (100 mg total) by mouth every 8 (eight) hours.  . methylPREDNISolone (MEDROL DOSEPAK) 4 MG TBPK tablet Use as directed  . Multiple Vitamins-Minerals (PRESERVISION AREDS PO) Take 1 capsule by mouth 2 (two) times daily.   Marland Kitchen omeprazole (PRILOSEC OTC) 20 MG tablet Take 20 mg by mouth daily with lunch.   . traMADol (ULTRAM) 50 MG tablet Take 1 tablet (50 mg total) by mouth every 6 (six) hours as needed for up to 12 doses.   No current facility-administered medications for this visit. (Other)      REVIEW OF SYSTEMS:  ALLERGIES No Known Allergies  PAST MEDICAL HISTORY Past Medical History:  Diagnosis Date  . Acute meniscal tear of knee RIGHT KNEE  . Arthritis   . BPH (benign prostatic hypertrophy)   . Colon polyps    hyperplastic  . Diabetes mellitus type 2, diet-controlled (HCC) AND EXERCISE-- LAST A1C  5.6  . Diverticulosis   . Gait abnormality 11/08/2016  . GERD (gastroesophageal reflux disease)   . Glaucoma   . H/O hiatal hernia   . Heart murmur   . History of esophageal stricture S/P DILATION IN 2007  . Hyperlipidemia   . Hypertension   . IgM monoclonal gammopathy of uncertain significance ASYMPTOMATIC    FOLLOWED BY DR Alen Blew  . Internal hemorrhoids   . Macrocytic anemia DUE TO CHRONIC RENAL INSUFF.  . Nocturia    . Persistent atrial fibrillation ([place])   . Right knee pain    Past Surgical History:  Procedure Laterality Date  . CATARACT EXTRACTION W/ INTRAOCULAR LENS  IMPLANT, BILATERAL  2012  . HEMORRHOID SURGERY    . HERNIA REPAIR    . KNEE ARTHROSCOPY  12/25/2011   Procedure: ARTHROSCOPY KNEE;  Surgeon: Tobi Bastos, MD;  Location: [facility];  Service: Orthopedics;  Laterality: Right;  right knee arthroscopy with medial menisectomy      FAMILY HISTORY Family History  Problem Relation Age of Onset  . Diabetes Brother   . Heart attack Father   . Early death Father   . Hearing loss Father   . Heart disease Father   . Alcohol abuse Sister   . Cancer Sister     SOCIAL HISTORY Social History   Tobacco Use  . Smoking status: Former Smoker    Packs/day: 1.00    Years: 10.00    Pack years: 10.00    Types: Cigarettes  . Smokeless tobacco: Never Used  Substance Use Topics  . Alcohol use: Yes    Alcohol/week: 14.0 standard drinks    Types: 14 Standard drinks or equivalent per week  . Drug use: No         OPHTHALMIC EXAM:  Base Eye Exam    Visual Acuity (Snellen - Linear)      Right Left   Dist cc 20/100 -1 20/40 -2   Dist ph cc 20/60 -2 20/30 -2       Tonometry (Tonopen, 1:38 PM)      Right Left   Pressure 12 10       Pupils      Pupils Dark Light Shape React APD   Right PERRL 5 3 Round Brisk None   Left PERRL 4 3 Round Brisk +1       Visual Fields (Counting fingers)      Left Right     Full   Restrictions Partial outer inferior nasal deficiency        Neuro/Psych    Oriented x3: Yes   Mood/Affect: Normal       Dilation    Right eye: 1.0% Mydriacyl, 2.5% Phenylephrine @ 1:41 PM        Slit Lamp and Fundus Exam    External Exam      Right Left   External Normal Normal       Slit Lamp Exam      Right Left   Lids/Lashes Normal Normal   Conjunctiva/Sclera White and quiet White and quiet   Cornea Clear Clear   Anterior Chamber  Deep and quiet Deep and quiet  Iris Round and reactive Round and reactive   Lens Posterior chamber intraocular lens Posterior chamber intraocular lens   Anterior Vitreous Normal Normal       Fundus Exam      Right Left   Posterior Vitreous Posterior vitreous detachment    Disc Normal    C/D Ratio 0.15    Macula Retinal pigment epithelial mottling, Subretinal neovascular membrane, no hemorrhage, Retinal pigment epithelial atrophy, Macular atrophy, Early age related macular degeneration, no exudates, Epiretinal membrane    Vessels Normal    Periphery Normal           IMAGING AND PROCEDURES  Imaging and Procedures for 06/02/20  OCT, Retina - OU - Both Eyes       Right Eye Quality was good. Scan locations included subfoveal. Central Foveal Thickness: 352. Progression has been stable. Findings include abnormal foveal contour, epiretinal membrane, vitreous traction, cystoid macular edema, outer retinal atrophy, subretinal hyper-reflective material.   Left Eye Quality was good. Scan locations included subfoveal. Central Foveal Thickness: 290. Progression has improved. Findings include no IRF, no SRF, retinal drusen .   Notes OD, overall stable and much less perifoveal CME on intravitreal Avastin today at an interval of 8 weeks.  We will this decrease this interval as multiple recurrences in the past OD  OS vastly improved anatomy and stable with no recurrence of subretinal fluid at 3-week follow-up today,,        Intravitreal Injection, Pharmacologic Agent - OD - Right Eye       Time Out 06/02/2020. 2:28 PM. Confirmed correct patient, procedure, site, and patient consented.   Anesthesia Topical anesthesia was used. Anesthetic medications included [place] 3.5%.   Procedure Preparation included 5% betadine to ocular surface, Tobramycin 0.3%, 10% betadine to eyelids. A 30 gauge needle was used.   Injection:  2.5 mg Bevacizumab (AVASTIN) 2.5mg /0.91mL SOSY   NDC: 44967-591-63,  Lot: [id number]   Route: Intravitreal, Site: Right Eye  Post-op Post injection exam found visual acuity of at least counting fingers. The patient tolerated the procedure well. There were no complications. The patient received written and verbal post procedure care education. Post injection medications were not given.                 ASSESSMENT/PLAN:  Exudative age-related macular degeneration of right eye with active choroidal neovascularization (HCC) History of multiple recurrences, today at 8-week follow-up, increased central thickening and intraretinal fluid on OCT.  Will need to repeat Avastin OD today and examination again in 6 or 7 weeks      ICD-10-CM   1. Exudative age-related macular degeneration of right eye with active choroidal neovascularization (HCC)  H35.3211 OCT, Retina - OU - Both Eyes    Intravitreal Injection, Pharmacologic Agent - OD - Right Eye    bevacizumab (AVASTIN) SOSY 2.5 mg    1.  OD, increased retinal thickening in the center of the fovea at 8-week follow-up.  History of multiple recurrences from wet AMD.  Repeat injection Avastin today and and evaluate again right eye in 6 weeks  2.  OS today at 3 weeks follow-up, follow-up left eye as scheduled  3.  Ophthalmic Meds Ordered this visit:  Meds ordered this encounter  Medications  . bevacizumab (AVASTIN) SOSY 2.5 mg       Return in about 6 weeks (around 07/14/2020) for dilate, OD, AVASTIN OCT.  There are no Patient Instructions on file for this visit.   Explained the diagnoses, plan, and follow up  with the patient and they expressed understanding.  Patient expressed understanding of the importance of proper follow up care.   Clent Demark Sheccid Lahmann M.D. Diseases & Surgery of the Retina and Vitreous Retina & Diabetic [place] 06/02/20     Abbreviations: M myopia (nearsighted); A astigmatism; H hyperopia (farsighted); P presbyopia; Mrx spectacle prescription;  CTL contact lenses; OD right eye; OS  left eye; OU both eyes  XT exotropia; ET esotropia; PEK punctate epithelial keratitis; PEE punctate epithelial erosions; DES dry eye syndrome; MGD meibomian gland dysfunction; ATs artificial tears; PFAT's preservative free artificial tears; [place] nuclear sclerotic cataract; PSC posterior subcapsular cataract; ERM epi-retinal membrane; PVD posterior vitreous detachment; RD retinal detachment; DM diabetes mellitus; DR diabetic retinopathy; NPDR non-proliferative diabetic retinopathy; PDR proliferative diabetic retinopathy; CSME clinically significant macular edema; DME diabetic macular edema; dbh dot blot hemorrhages; CWS cotton wool spot; POAG primary open angle glaucoma; C/D cup-to-disc ratio; HVF humphrey visual field; GVF goldmann visual field; OCT optical coherence tomography; IOP intraocular pressure; BRVO Branch retinal vein occlusion; CRVO central retinal vein occlusion; CRAO central retinal artery occlusion; BRAO branch retinal artery occlusion; RT retinal tear; SB scleral buckle; PPV pars plana vitrectomy; VH Vitreous hemorrhage; PRP panretinal laser photocoagulation; IVK intravitreal kenalog; VMT vitreomacular traction; MH Macular hole;  NVD neovascularization of the disc; NVE neovascularization elsewhere; AREDS age related eye disease study; ARMD age related macular degeneration; POAG primary open angle glaucoma; EBMD epithelial/anterior basement membrane dystrophy; ACIOL anterior chamber intraocular lens; IOL intraocular lens; PCIOL posterior chamber intraocular lens; Phaco/IOL phacoemulsification with intraocular lens placement; [place] photorefractive keratectomy; LASIK laser assisted in situ keratomileusis; HTN hypertension; DM diabetes mellitus; COPD chronic obstructive pulmonary disease

## 2020-06-02 NOTE — Assessment & Plan Note (Signed)
History of multiple recurrences, today at 8-week follow-up, increased central thickening and intraretinal fluid on OCT.  Will need to repeat Avastin OD today and examination again in 6 or 7 weeks

## 2020-07-07 ENCOUNTER — Encounter (INDEPENDENT_AMBULATORY_CARE_PROVIDER_SITE_OTHER): Payer: Medicare Other | Admitting: Ophthalmology

## 2020-07-14 ENCOUNTER — Other Ambulatory Visit: Payer: Self-pay

## 2020-07-14 ENCOUNTER — Encounter (INDEPENDENT_AMBULATORY_CARE_PROVIDER_SITE_OTHER): Payer: Self-pay | Admitting: Ophthalmology

## 2020-07-14 ENCOUNTER — Ambulatory Visit (INDEPENDENT_AMBULATORY_CARE_PROVIDER_SITE_OTHER): Payer: Medicare Other | Admitting: Ophthalmology

## 2020-07-14 DIAGNOSIS — H353221 Exudative age-related macular degeneration, left eye, with active choroidal neovascularization: Secondary | ICD-10-CM

## 2020-07-14 DIAGNOSIS — H35371 Puckering of macula, right eye: Secondary | ICD-10-CM

## 2020-07-14 DIAGNOSIS — H353211 Exudative age-related macular degeneration, right eye, with active choroidal neovascularization: Secondary | ICD-10-CM | POA: Diagnosis not present

## 2020-07-14 MED ORDER — AFLIBERCEPT 2MG/0.05ML IZ SOLN FOR KALEIDOSCOPE
2.0000 mg | INTRAVITREAL | Status: AC | PRN
  Administered 2020-07-14: 2 mg via INTRAVITREAL

## 2020-07-14 NOTE — Assessment & Plan Note (Signed)
This with VMA/VMT of the right eye accounts for some of the vision loss but subfoveal scarring now exists

## 2020-07-14 NOTE — Assessment & Plan Note (Signed)
Persistent subretinal and intraretinal fluid OD yet improved overall

## 2020-07-14 NOTE — Assessment & Plan Note (Signed)
At 8-week follow-up, much less intraretinal fluid and subretinal fluid in the eye with good acuity, as compared to October 2021.  Repeat injection Eylea today

## 2020-07-14 NOTE — Progress Notes (Signed)
07/14/2020     CHIEF COMPLAINT Patient presents for Retina Follow Up (9WK F/U- Possible Avastin OS/Pt states, " I feel like my VA is worse since LOV, especially NVA."/)   HISTORY OF PRESENT ILLNESS: Adam [name] is a 87 y.o. male who presents to the clinic today for:   HPI    Retina Follow Up    Patient presents with  Wet AMD.  In left eye.  This started 8 weeks ago.  Severity is mild.  Duration of 8 weeks.  Since onset it is gradually worsening. Additional comments: 9WK F/U- Possible Avastin OS Pt states, " I feel like my VA is worse since LOV, especially NVA."        Last edited by Hurman Horn, MD on 07/14/2020  2:10 PM. (History)      Referring physician: Javier Glazier, MD [place],  [place] [address]  HISTORICAL INFORMATION:   Selected notes from the MEDICAL RECORD NUMBER    Lab Results  Component Value Date   HGBA1C 5.7 (H) 08/03/2019     CURRENT MEDICATIONS: Current Outpatient Medications (Ophthalmic Drugs)  Medication Sig  . brimonidine-timolol (COMBIGAN) 0.2-0.5 % ophthalmic solution Place 1 drop into the left eye 2 (two) times daily.   . Netarsudil-Latanoprost 0.02-0.005 % SOLN Place 1 drop into the left eye at bedtime.    No current facility-administered medications for this visit. (Ophthalmic Drugs)   Current Outpatient Medications (Other)  Medication Sig  . acetaminophen (TYLENOL) 500 MG tablet Take 500 mg by mouth daily as needed for headache (pain).  Marland Kitchen amiodarone (PACERONE) 100 MG tablet Take 100 mg by mouth daily.  Marland Kitchen apixaban (ELIQUIS) 2.5 MG TABS tablet Take 1 tablet (2.5 mg total) by mouth 2 (two) times daily.  . Ascorbic Acid (VITAMIN C) 500 MG CAPS Take 500 mg by mouth 2 (two) times daily.  Marland Kitchen atorvastatin (LIPITOR) 20 MG tablet Take 1 tablet (20 mg total) by mouth daily.  . betamethasone dipropionate (DIPROLENE) 0.05 % ointment Apply 1 application topically daily as needed (itching/rash).   Marland Kitchen Bioflavonoid Products (ESTER C PO) Take  1 tablet by mouth 2 (two) times daily.  . clobetasol cream (TEMOVATE) 8.52 % Apply 1 application topically daily as needed (itching/rash).   . febuxostat (ULORIC) 40 MG tablet Take 40 mg by mouth See admin instructions. Take one tablet (40 mg) by mouth Tuesday, Friday, Sunday evening  . ferrous sulfate 324 MG TBEC Take 324 mg by mouth daily with breakfast.  . finasteride (PROSCAR) 5 MG tablet Take 5 mg by mouth daily.   . furosemide (LASIX) 40 MG tablet Take 40 mg by mouth. 80mg  in the AM; 40mg  in the PM  . gabapentin (NEURONTIN) 100 MG capsule Take 100 mg by mouth 3 (three) times daily.   . hydrALAZINE (APRESOLINE) 100 MG tablet Take 1 tablet (100 mg total) by mouth every 8 (eight) hours.  . methylPREDNISolone (MEDROL DOSEPAK) 4 MG TBPK tablet Use as directed  . Multiple Vitamins-Minerals (PRESERVISION AREDS PO) Take 1 capsule by mouth 2 (two) times daily.   Marland Kitchen omeprazole (PRILOSEC OTC) 20 MG tablet Take 20 mg by mouth daily with lunch.   . traMADol (ULTRAM) 50 MG tablet Take 1 tablet (50 mg total) by mouth every 6 (six) hours as needed for up to 12 doses.   No current facility-administered medications for this visit. (Other)      REVIEW OF SYSTEMS:    ALLERGIES No Known Allergies  PAST MEDICAL  HISTORY Past Medical History:  Diagnosis Date  . Acute meniscal tear of knee RIGHT KNEE  . Arthritis   . BPH (benign prostatic hypertrophy)   . Colon polyps    hyperplastic  . Diabetes mellitus type 2, diet-controlled (HCC) AND EXERCISE-- LAST A1C  5.6  . Diverticulosis   . Gait abnormality 11/08/2016  . GERD (gastroesophageal reflux disease)   . Glaucoma   . H/O hiatal hernia   . Heart murmur   . History of esophageal stricture S/P DILATION IN 2007  . Hyperlipidemia   . Hypertension   . IgM monoclonal gammopathy of uncertain significance ASYMPTOMATIC    FOLLOWED BY DR Alen Blew  . Internal hemorrhoids   . Macrocytic anemia DUE TO CHRONIC RENAL INSUFF.  . Nocturia   . Persistent  atrial fibrillation ([place])   . Right knee pain    Past Surgical History:  Procedure Laterality Date  . CATARACT EXTRACTION W/ INTRAOCULAR LENS  IMPLANT, BILATERAL  2012  . HEMORRHOID SURGERY    . HERNIA REPAIR    . KNEE ARTHROSCOPY  12/25/2011   Procedure: ARTHROSCOPY KNEE;  Surgeon: Tobi Bastos, MD;  Location: [facility];  Service: Orthopedics;  Laterality: Right;  right knee arthroscopy with medial menisectomy      FAMILY HISTORY Family History  Problem Relation Age of Onset  . Diabetes Brother   . Heart attack Father   . Early death Father   . Hearing loss Father   . Heart disease Father   . Alcohol abuse Sister   . Cancer Sister     SOCIAL HISTORY Social History   Tobacco Use  . Smoking status: Former Smoker    Packs/day: 1.00    Years: 10.00    Pack years: 10.00    Types: Cigarettes  . Smokeless tobacco: Never Used  Substance Use Topics  . Alcohol use: Yes    Alcohol/week: 14.0 standard drinks    Types: 14 Standard drinks or equivalent per week  . Drug use: No         OPHTHALMIC EXAM: Base Eye Exam    Visual Acuity (ETDRS)      Right Left   Dist [place] 20/80 20/40 -2   Dist ph [place] NI 20/30       Tonometry (Tonopen, 1:29 PM)      Right Left   Pressure 12 12       Pupils      Pupils Dark Light Shape React APD   Right PERRL 5 4 Round Brisk None   Left PERRL 3 3 Round Brisk +1       Visual Fields      Left Right   Restrictions Partial outer inferior nasal deficiency        Neuro/Psych    Oriented x3: Yes   Mood/Affect: Normal       Dilation    Left eye: 1.0% Mydriacyl, 2.5% Phenylephrine @ 1:29 PM        Slit Lamp and Fundus Exam    External Exam      Right Left   External Normal Normal       Slit Lamp Exam      Right Left   Lids/Lashes Normal Normal   Conjunctiva/Sclera White and quiet White and quiet   Cornea Clear Clear   Anterior Chamber Deep and quiet Deep and quiet   Iris Round and reactive Round and  reactive   Lens Posterior chamber intraocular lens Posterior chamber intraocular lens  Anterior Vitreous Normal Normal       Fundus Exam      Right Left   Posterior Vitreous  Posterior vitreous detachment   Disc  Pallor 1+   C/D Ratio  0.5   Macula  Atrophy, Age related macular degeneration, Early age related macular degeneration, Macular thickening improved, Disciform scar, Ge[place] atrophy, Epiretinal membrane   Vessels  Normal   Periphery  Normal          IMAGING AND PROCEDURES  Imaging and Procedures for 07/14/20  OCT, Retina - OU - Both Eyes       Right Eye Quality was good. Scan locations included subfoveal. Central Foveal Thickness: 352. Progression has been stable. Findings include abnormal foveal contour, epiretinal membrane, vitreous traction, cystoid macular edema, outer retinal atrophy, subretinal hyper-reflective material.   Left Eye Quality was good. Scan locations included subfoveal. Central Foveal Thickness: 290. Progression has improved. Findings include no IRF, no SRF, retinal drusen .   Notes OD, overall stable and much less perifoveal CME on intravitreal Avastin today at an interval of 8 weeks.  We will this decrease this interval as multiple recurrences in the past OD  OS vastly improved anatomy and stable with no recurrence of subretinal fluid at 8-week follow-up today,, repeat Eylea injection OS       Intravitreal Injection, Pharmacologic Agent - OS - Left Eye       Time Out 07/14/2020. 2:11 PM. Confirmed correct patient, procedure, site, and patient consented.   Anesthesia Topical anesthesia was used.   Procedure Preparation included 10% betadine to eyelids, 5% betadine to ocular surface, Ofloxacin . A 30 gauge needle was used.   Injection:  2 mg aflibercept Alfonse Flavors) SOLN   NDC: A3590391, Lot: [id number]   Route: Intravitreal, Site: Left Eye, Waste: 0 mg  Post-op Post injection exam found visual acuity of at least counting fingers.  The patient tolerated the procedure well. There were no complications. The patient received written and verbal post procedure care education. Post injection medications were not given.                 ASSESSMENT/PLAN:  Exudative age-related macular degeneration of left eye with active choroidal neovascularization (HCC) At 8-week follow-up, much less intraretinal fluid and subretinal fluid in the eye with good acuity, as compared to October 2021.  Repeat injection Eylea today  Exudative age-related macular degeneration of right eye with active choroidal neovascularization (HCC) Persistent subretinal and intraretinal fluid OD yet improved overall  Macular pucker, right eye This with VMA/VMT of the right eye accounts for some of the vision loss but subfoveal scarring now exists      ICD-10-CM   1. Exudative age-related macular degeneration of right eye with active choroidal neovascularization (HCC)  H35.3211 OCT, Retina - OU - Both Eyes    Intravitreal Injection, Pharmacologic Agent - OS - Left Eye    aflibercept (EYLEA) SOLN 2 mg  2. Exudative age-related macular degeneration of left eye with active choroidal neovascularization ([place])  H35.3221   3. Macular pucker, right eye  H35.371     1.  OS, stable and acuity improved acuity and improved anatomy on Eylea currently 8-week follow-up.  Pete injection OS today Eylea  2.  Dilate OD next as scheduled  3.  Ophthalmic Meds Ordered this visit:  Meds ordered this encounter  Medications  . aflibercept (EYLEA) SOLN 2 mg       Return in about 8 weeks (around 09/08/2020) for dilate, OS, EYLEA  OCT.  There are no Patient Instructions on file for this visit.   Explained the diagnoses, plan, and follow up with the patient and they expressed understanding.  Patient expressed understanding of the importance of proper follow up care.   Clent Demark Jayli Fogleman M.D. Diseases & Surgery of the Retina and Vitreous Retina & Diabetic [place] 07/14/20     Abbreviations: M myopia (nearsighted); A astigmatism; H hyperopia (farsighted); P presbyopia; Mrx spectacle prescription;  CTL contact lenses; OD right eye; OS left eye; OU both eyes  XT exotropia; ET esotropia; PEK punctate epithelial keratitis; PEE punctate epithelial erosions; DES dry eye syndrome; MGD meibomian gland dysfunction; ATs artificial tears; PFAT's preservative free artificial tears; [place] nuclear sclerotic cataract; PSC posterior subcapsular cataract; ERM epi-retinal membrane; PVD posterior vitreous detachment; RD retinal detachment; DM diabetes mellitus; DR diabetic retinopathy; NPDR non-proliferative diabetic retinopathy; PDR proliferative diabetic retinopathy; CSME clinically significant macular edema; DME diabetic macular edema; dbh dot blot hemorrhages; CWS cotton wool spot; POAG primary open angle glaucoma; C/D cup-to-disc ratio; HVF humphrey visual field; GVF goldmann visual field; OCT optical coherence tomography; IOP intraocular pressure; BRVO Branch retinal vein occlusion; CRVO central retinal vein occlusion; CRAO central retinal artery occlusion; BRAO branch retinal artery occlusion; RT retinal tear; SB scleral buckle; PPV pars plana vitrectomy; VH Vitreous hemorrhage; PRP panretinal laser photocoagulation; IVK intravitreal kenalog; VMT vitreomacular traction; MH Macular hole;  NVD neovascularization of the disc; NVE neovascularization elsewhere; AREDS age related eye disease study; ARMD age related macular degeneration; POAG primary open angle glaucoma; EBMD epithelial/anterior basement membrane dystrophy; ACIOL anterior chamber intraocular lens; IOL intraocular lens; PCIOL posterior chamber intraocular lens; Phaco/IOL phacoemulsification with intraocular lens placement; [place] photorefractive keratectomy; LASIK laser assisted in situ keratomileusis; HTN hypertension; DM diabetes mellitus; COPD chronic obstructive pulmonary disease

## 2020-07-19 ENCOUNTER — Encounter (INDEPENDENT_AMBULATORY_CARE_PROVIDER_SITE_OTHER): Payer: Self-pay | Admitting: Ophthalmology

## 2020-07-19 ENCOUNTER — Other Ambulatory Visit: Payer: Self-pay

## 2020-07-19 ENCOUNTER — Ambulatory Visit (INDEPENDENT_AMBULATORY_CARE_PROVIDER_SITE_OTHER): Payer: Medicare Other | Admitting: Ophthalmology

## 2020-07-19 DIAGNOSIS — H353113 Nonexudative age-related macular degeneration, right eye, advanced atrophic without subfoveal involvement: Secondary | ICD-10-CM

## 2020-07-19 DIAGNOSIS — H353211 Exudative age-related macular degeneration, right eye, with active choroidal neovascularization: Secondary | ICD-10-CM | POA: Diagnosis not present

## 2020-07-19 MED ORDER — BEVACIZUMAB 2.5 MG/0.1ML IZ SOSY
2.5000 mg | PREFILLED_SYRINGE | INTRAVITREAL | Status: AC | PRN
  Administered 2020-07-19: 2.5 mg via INTRAVITREAL

## 2020-07-19 NOTE — Progress Notes (Addendum)
07/19/2020     CHIEF COMPLAINT Patient presents for Retina Follow Up (6 Wk F/U OD, poss Avastin OD//Pt denies noticeable changes to [place] OU since last visit. Pt denies ocular pain, flashes of light, or floaters OU. //)   HISTORY OF PRESENT ILLNESS: Adam [name] is a 87 y.o. male who presents to the clinic today for:   HPI    Retina Follow Up    Patient presents with  Wet AMD.  In right eye.  This started 6 weeks ago.  Severity is mild.  Duration of 6 weeks.  Since onset it is stable. Additional comments: 6 Wk F/U OD, poss Avastin OD  Pt denies noticeable changes to [place] OU since last visit. Pt denies ocular pain, flashes of light, or floaters OU.          Last edited by Rockie Neighbours, [place] on 07/19/2020  2:13 PM. (History)      Referring physician: Javier Glazier, MD [place],  [place] [address]  HISTORICAL INFORMATION:   Selected notes from the MEDICAL RECORD NUMBER    Lab Results  Component Value Date   HGBA1C 5.7 (H) 08/03/2019     CURRENT MEDICATIONS: Current Outpatient Medications (Ophthalmic Drugs)  Medication Sig  . brimonidine-timolol (COMBIGAN) 0.2-0.5 % ophthalmic solution Place 1 drop into the left eye 2 (two) times daily.   . Netarsudil-Latanoprost 0.02-0.005 % SOLN Place 1 drop into the left eye at bedtime.    No current facility-administered medications for this visit. (Ophthalmic Drugs)   Current Outpatient Medications (Other)  Medication Sig  . acetaminophen (TYLENOL) 500 MG tablet Take 500 mg by mouth daily as needed for headache (pain).  Marland Kitchen amiodarone (PACERONE) 100 MG tablet Take 100 mg by mouth daily.  Marland Kitchen apixaban (ELIQUIS) 2.5 MG TABS tablet Take 1 tablet (2.5 mg total) by mouth 2 (two) times daily.  . Ascorbic Acid (VITAMIN C) 500 MG CAPS Take 500 mg by mouth 2 (two) times daily.  Marland Kitchen atorvastatin (LIPITOR) 20 MG tablet Take 1 tablet (20 mg total) by mouth daily.  . betamethasone dipropionate (DIPROLENE) 0.05 % ointment Apply 1 application  topically daily as needed (itching/rash).   Marland Kitchen Bioflavonoid Products (ESTER C PO) Take 1 tablet by mouth 2 (two) times daily.  . clobetasol cream (TEMOVATE) 4.13 % Apply 1 application topically daily as needed (itching/rash).   . febuxostat (ULORIC) 40 MG tablet Take 40 mg by mouth See admin instructions. Take one tablet (40 mg) by mouth Tuesday, Friday, Sunday evening  . ferrous sulfate 324 MG TBEC Take 324 mg by mouth daily with breakfast.  . finasteride (PROSCAR) 5 MG tablet Take 5 mg by mouth daily.   . furosemide (LASIX) 40 MG tablet Take 40 mg by mouth. 80mg  in the AM; 40mg  in the PM  . gabapentin (NEURONTIN) 100 MG capsule Take 100 mg by mouth 3 (three) times daily.   . hydrALAZINE (APRESOLINE) 100 MG tablet Take 1 tablet (100 mg total) by mouth every 8 (eight) hours.  . methylPREDNISolone (MEDROL DOSEPAK) 4 MG TBPK tablet Use as directed  . Multiple Vitamins-Minerals (PRESERVISION AREDS PO) Take 1 capsule by mouth 2 (two) times daily.   Marland Kitchen omeprazole (PRILOSEC OTC) 20 MG tablet Take 20 mg by mouth daily with lunch.   . traMADol (ULTRAM) 50 MG tablet Take 1 tablet (50 mg total) by mouth every 6 (six) hours as needed for up to 12 doses.   No current facility-administered medications for this visit. (Other)  REVIEW OF SYSTEMS:    ALLERGIES No Known Allergies  PAST MEDICAL HISTORY Past Medical History:  Diagnosis Date  . Acute meniscal tear of knee RIGHT KNEE  . Arthritis   . BPH (benign prostatic hypertrophy)   . Colon polyps    hyperplastic  . Diabetes mellitus type 2, diet-controlled (HCC) AND EXERCISE-- LAST A1C  5.6  . Diverticulosis   . Gait abnormality 11/08/2016  . GERD (gastroesophageal reflux disease)   . Glaucoma   . H/O hiatal hernia   . Heart murmur   . History of esophageal stricture S/P DILATION IN 2007  . Hyperlipidemia   . Hypertension   . IgM monoclonal gammopathy of uncertain significance ASYMPTOMATIC    FOLLOWED BY DR Alen Blew  . Internal  hemorrhoids   . Macrocytic anemia DUE TO CHRONIC RENAL INSUFF.  . Nocturia   . Persistent atrial fibrillation ([place])   . Right knee pain    Past Surgical History:  Procedure Laterality Date  . CATARACT EXTRACTION W/ INTRAOCULAR LENS  IMPLANT, BILATERAL  2012  . HEMORRHOID SURGERY    . HERNIA REPAIR    . KNEE ARTHROSCOPY  12/25/2011   Procedure: ARTHROSCOPY KNEE;  Surgeon: Tobi Bastos, MD;  Location: [facility];  Service: Orthopedics;  Laterality: Right;  right knee arthroscopy with medial menisectomy      FAMILY HISTORY Family History  Problem Relation Age of Onset  . Diabetes Brother   . Heart attack Father   . Early death Father   . Hearing loss Father   . Heart disease Father   . Alcohol abuse Sister   . Cancer Sister     SOCIAL HISTORY Social History   Tobacco Use  . Smoking status: Former Smoker    Packs/day: 1.00    Years: 10.00    Pack years: 10.00    Types: Cigarettes  . Smokeless tobacco: Never Used  Substance Use Topics  . Alcohol use: Yes    Alcohol/week: 14.0 standard drinks    Types: 14 Standard drinks or equivalent per week  . Drug use: No         OPHTHALMIC EXAM:  Base Eye Exam    Visual Acuity (ETDRS)      Right Left   Dist [place] 20/80 +1 20/40 +2   Dist ph [place] NI 20/30ecc +1       Tonometry (Tonopen, 2:19 PM)      Right Left   Pressure 10 10       Pupils      Dark Light Shape React APD   Right 4 3 Round Brisk None   Left 3 2 Round Brisk +1       Visual Fields (Counting fingers)      Left Right     Full   Restrictions Partial outer inferior nasal deficiency        Extraocular Movement      Right Left    Full Full       Neuro/Psych    Oriented x3: Yes   Mood/Affect: Normal       Dilation    Right eye: 1.0% Mydriacyl, 2.5% Phenylephrine @ 2:19 PM        Slit Lamp and Fundus Exam    External Exam      Right Left   External Normal Normal       Slit Lamp Exam      Right Left   Lids/Lashes Normal  Normal   Conjunctiva/Sclera White and  quiet White and quiet   Cornea Clear Clear   Anterior Chamber Deep and quiet Deep and quiet   Iris Round and reactive Round and reactive   Lens Posterior chamber intraocular lens Posterior chamber intraocular lens   Anterior Vitreous Normal Normal       Fundus Exam      Right Left   Posterior Vitreous Posterior vitreous detachment    Disc Normal    C/D Ratio 0.15    Macula Retinal pigment epithelial mottling, Subretinal neovascular membrane, no hemorrhage, Retinal pigment epithelial atrophy, Macular atrophy, Early age related macular degeneration, no exudates, Epiretinal membrane    Vessels Normal    Periphery Normal           IMAGING AND PROCEDURES  Imaging and Procedures for 07/19/20  OCT, Retina - OU - Both Eyes       Right Eye Quality was good. Scan locations included subfoveal. Central Foveal Thickness: 352. Progression has been stable. Findings include abnormal foveal contour, epiretinal membrane, vitreous traction, cystoid macular edema, outer retinal atrophy, subretinal hyper-reflective material.   Left Eye Quality was good. Scan locations included subfoveal. Central Foveal Thickness: 290. Progression has improved. Findings include no IRF, no SRF, retinal drusen .   Notes OD, overall stable and much less perifoveal CME on intravitreal Avastin today at an interval of 6 weeks.  We will maintain same interval right eye at 6 weeks follow-up  OS, improved 5 days after recent Eylea follow-up and scheduled        Intravitreal Injection, Pharmacologic Agent - OD - Right Eye       Time Out 07/19/2020. 2:48 PM. Confirmed correct patient, procedure, site, and patient consented.   Anesthesia Topical anesthesia was used. Anesthetic medications included [place] 3.5%.   Procedure Preparation included Tobramycin 0.3%, 10% betadine to eyelids, 5% betadine to ocular surface. A 30 gauge needle was used.   Injection:  2.5 mg Bevacizumab  (AVASTIN) 2.5mg /0.70mL SOSY   NDC: 85885-027-74, Lot: [id number]   Route: Intravitreal, Site: Right Eye  Post-op Post injection exam found visual acuity of at least counting fingers. The patient tolerated the procedure well. There were no complications. The patient received written and verbal post procedure care education. Post injection medications were not given.                 ASSESSMENT/PLAN:  Advanced nonexudative age-related macular degeneration of right eye without subfoveal involvement Small projected fixation light, foveal region is not in the area of geographic  atrophy parafoveal  Exudative age-related macular degeneration of right eye with active choroidal neovascularization (HCC) At 6-week follow-up today, repeat injection and follow-up again in 6 weeks right I      ICD-10-CM   1. Exudative age-related macular degeneration of right eye with active choroidal neovascularization (HCC)  H35.3211 OCT, Retina - OU - Both Eyes    Intravitreal Injection, Pharmacologic Agent - OD - Right Eye    bevacizumab (AVASTIN) SOSY 2.5 mg  2. Advanced nonexudative age-related macular degeneration of right eye without subfoveal involvement  H35.3113     1.  Improved overall macular findings right eye on therapy, currently at 6-week interval with recurrence of intraretinal fluid visible OD.  We will repeat injection today.  Follow-up in 6 to 7 weeks OD  2.  3.  Ophthalmic Meds Ordered this visit:  Meds ordered this encounter  Medications  . bevacizumab (AVASTIN) SOSY 2.5 mg       Return for RV 6 to 7 weeks,  AVASTIN OCT, OD.  There are no Patient Instructions on file for this visit.   Explained the diagnoses, plan, and follow up with the patient and they expressed understanding.  Patient expressed understanding of the importance of proper follow up care.   Clent Demark Tanveer Dobberstein M.D. Diseases & Surgery of the Retina and Vitreous Retina & Diabetic [place] 07/19/20     Abbreviations: M myopia (nearsighted); A astigmatism; H hyperopia (farsighted); P presbyopia; Mrx spectacle prescription;  CTL contact lenses; OD right eye; OS left eye; OU both eyes  XT exotropia; ET esotropia; PEK punctate epithelial keratitis; PEE punctate epithelial erosions; DES dry eye syndrome; MGD meibomian gland dysfunction; ATs artificial tears; PFAT's preservative free artificial tears; [place] nuclear sclerotic cataract; PSC posterior subcapsular cataract; ERM epi-retinal membrane; PVD posterior vitreous detachment; RD retinal detachment; DM diabetes mellitus; DR diabetic retinopathy; NPDR non-proliferative diabetic retinopathy; PDR proliferative diabetic retinopathy; CSME clinically significant macular edema; DME diabetic macular edema; dbh dot blot hemorrhages; CWS cotton wool spot; POAG primary open angle glaucoma; C/D cup-to-disc ratio; HVF humphrey visual field; GVF goldmann visual field; OCT optical coherence tomography; IOP intraocular pressure; BRVO Branch retinal vein occlusion; CRVO central retinal vein occlusion; CRAO central retinal artery occlusion; BRAO branch retinal artery occlusion; RT retinal tear; SB scleral buckle; PPV pars plana vitrectomy; VH Vitreous hemorrhage; PRP panretinal laser photocoagulation; IVK intravitreal kenalog; VMT vitreomacular traction; MH Macular hole;  NVD neovascularization of the disc; NVE neovascularization elsewhere; AREDS age related eye disease study; ARMD age related macular degeneration; POAG primary open angle glaucoma; EBMD epithelial/anterior basement membrane dystrophy; ACIOL anterior chamber intraocular lens; IOL intraocular lens; PCIOL posterior chamber intraocular lens; Phaco/IOL phacoemulsification with intraocular lens placement; [place] photorefractive keratectomy; LASIK laser assisted in situ keratomileusis; HTN hypertension; DM diabetes mellitus; COPD chronic obstructive pulmonary disease

## 2020-07-19 NOTE — Assessment & Plan Note (Signed)
Small projected fixation light, foveal region is not in the area of geographic  atrophy parafoveal

## 2020-07-19 NOTE — Assessment & Plan Note (Signed)
At 6-week follow-up today, repeat injection and follow-up again in 6 weeks right I

## 2020-08-19 ENCOUNTER — Encounter (HOSPITAL_COMMUNITY): Payer: Self-pay | Admitting: Internal Medicine

## 2020-08-19 ENCOUNTER — Observation Stay (HOSPITAL_COMMUNITY)
Admission: EM | Admit: 2020-08-19 | Discharge: 2020-08-21 | Disposition: A | Discharge status: Home / Self Care | Payer: Medicare Other | Attending: Internal Medicine | Admitting: Internal Medicine

## 2020-08-19 ENCOUNTER — Emergency Department (HOSPITAL_COMMUNITY): Payer: Medicare Other

## 2020-08-19 ENCOUNTER — Other Ambulatory Visit: Payer: Self-pay

## 2020-08-19 DIAGNOSIS — I629 Nontraumatic intracranial hemorrhage, unspecified: Secondary | ICD-10-CM

## 2020-08-19 DIAGNOSIS — S065X9A Traumatic subdural hemorrhage with loss of consciousness of unspecified duration, initial encounter: Secondary | ICD-10-CM

## 2020-08-19 DIAGNOSIS — R52 Pain, unspecified: Secondary | ICD-10-CM

## 2020-08-19 DIAGNOSIS — N184 Chronic kidney disease, stage 4 (severe): Secondary | ICD-10-CM | POA: Diagnosis present

## 2020-08-19 DIAGNOSIS — D631 Anemia in chronic kidney disease: Secondary | ICD-10-CM | POA: Diagnosis present

## 2020-08-19 DIAGNOSIS — S065XAA Traumatic subdural hemorrhage with loss of consciousness status unknown, initial encounter: Secondary | ICD-10-CM

## 2020-08-19 DIAGNOSIS — I130 Hypertensive heart and chronic kidney disease with heart failure and stage 1 through stage 4 chronic kidney disease, or unspecified chronic kidney disease: Secondary | ICD-10-CM | POA: Diagnosis not present

## 2020-08-19 DIAGNOSIS — S0101XA Laceration without foreign body of scalp, initial encounter: Principal | ICD-10-CM | POA: Insufficient documentation

## 2020-08-19 DIAGNOSIS — I480 Paroxysmal atrial fibrillation: Secondary | ICD-10-CM | POA: Insufficient documentation

## 2020-08-19 DIAGNOSIS — S06360A Traumatic hemorrhage of cerebrum, unspecified, without loss of consciousness, initial encounter: Secondary | ICD-10-CM | POA: Insufficient documentation

## 2020-08-19 DIAGNOSIS — Y92129 Unspecified place in nursing home as the place of occurrence of the external cause: Secondary | ICD-10-CM | POA: Insufficient documentation

## 2020-08-19 DIAGNOSIS — S0990XA Unspecified injury of head, initial encounter: Secondary | ICD-10-CM | POA: Diagnosis present

## 2020-08-19 DIAGNOSIS — M25512 Pain in left shoulder: Secondary | ICD-10-CM | POA: Diagnosis not present

## 2020-08-19 DIAGNOSIS — W010XXA Fall on same level from slipping, tripping and stumbling without subsequent striking against object, initial encounter: Secondary | ICD-10-CM | POA: Insufficient documentation

## 2020-08-19 DIAGNOSIS — Z7901 Long term (current) use of anticoagulants: Secondary | ICD-10-CM | POA: Insufficient documentation

## 2020-08-19 DIAGNOSIS — Z20822 Contact with and (suspected) exposure to covid-19: Secondary | ICD-10-CM | POA: Insufficient documentation

## 2020-08-19 DIAGNOSIS — I5032 Chronic diastolic (congestive) heart failure: Secondary | ICD-10-CM | POA: Insufficient documentation

## 2020-08-19 DIAGNOSIS — Z87891 Personal history of nicotine dependence: Secondary | ICD-10-CM | POA: Insufficient documentation

## 2020-08-19 DIAGNOSIS — Z79899 Other long term (current) drug therapy: Secondary | ICD-10-CM | POA: Diagnosis not present

## 2020-08-19 HISTORY — DX: Essential (primary) hypertension: I10

## 2020-08-19 HISTORY — DX: Cardiac murmur, unspecified: R01.1

## 2020-08-19 HISTORY — DX: Anemia, unspecified: D64.9

## 2020-08-19 HISTORY — DX: Chronic kidney disease, unspecified: N18.9

## 2020-08-19 HISTORY — DX: Heart failure, unspecified: I50.9

## 2020-08-19 LAB — COMPREHENSIVE METABOLIC PANEL
ALT: 24 U/L (ref 0–44)
AST: 18 U/L (ref 15–41)
Albumin: 4 g/dL (ref 3.5–5.0)
Alkaline Phosphatase: 58 U/L (ref 38–126)
Anion gap: 10 (ref 5–15)
BUN: 59 mg/dL — ABNORMAL HIGH (ref 8–23)
CO2: 25 mmol/L (ref 22–32)
Calcium: 8.9 mg/dL (ref 8.9–10.3)
Chloride: 102 mmol/L (ref 98–111)
Creatinine, Ser: 4.01 mg/dL — ABNORMAL HIGH (ref 0.61–1.24)
GFR, Estimated: 14 mL/min — ABNORMAL LOW (ref >60)
Glucose, Bld: 119 mg/dL — ABNORMAL HIGH (ref 70–99)
Potassium: 3.8 mmol/L (ref 3.5–5.1)
Sodium: 137 mmol/L (ref 135–145)
Total Bilirubin: 0.7 mg/dL (ref 0.3–1.2)
Total Protein: 6.5 g/dL (ref 6.5–8.1)

## 2020-08-19 LAB — CBC WITH DIFFERENTIAL/PLATELET
Abs Immature Granulocytes: 0.04 10*3/uL (ref 0.00–0.07)
Basophils Absolute: 0.1 10*3/uL (ref 0.0–0.1)
Basophils Relative: 2 %
Eosinophils Absolute: 0.4 10*3/uL (ref 0.0–0.5)
Eosinophils Relative: 5 %
HCT: 29.7 % — ABNORMAL LOW (ref 39.0–52.0)
Hemoglobin: 9.7 g/dL — ABNORMAL LOW (ref 13.0–17.0)
Immature Granulocytes: 1 %
Lymphocytes Relative: 23 %
Lymphs Abs: 1.7 10*3/uL (ref 0.7–4.0)
MCH: 32.4 pg (ref 26.0–34.0)
MCHC: 32.7 g/dL (ref 30.0–36.0)
MCV: 99.3 fL (ref 80.0–100.0)
Monocytes Absolute: 0.9 10*3/uL (ref 0.1–1.0)
Monocytes Relative: 12 %
Neutro Abs: 4.5 10*3/uL (ref 1.7–7.7)
Neutrophils Relative %: 57 %
Platelets: 202 10*3/uL (ref 150–400)
RBC: 2.99 MIL/uL — ABNORMAL LOW (ref 4.22–5.81)
RDW: 13.5 % (ref 11.5–15.5)
WBC: 7.7 10*3/uL (ref 4.0–10.5)
nRBC: 0 % (ref 0.0–0.2)

## 2020-08-19 LAB — PROTIME-INR
INR: 1.2 (ref 0.8–1.2)
Prothrombin Time: 15.4 seconds — ABNORMAL HIGH (ref 11.4–15.2)

## 2020-08-19 LAB — TROPONIN I (HIGH SENSITIVITY): Troponin I (High Sensitivity): 11 ng/L (ref <18)

## 2020-08-19 MED ORDER — HYDRALAZINE HCL 20 MG/ML IJ SOLN
10.0000 mg | INTRAMUSCULAR | Status: DC | PRN
  Administered 2020-08-20: 10 mg via INTRAVENOUS
  Filled 2020-08-19: qty 1

## 2020-08-19 MED ORDER — ACETAMINOPHEN 325 MG PO TABS
650.0000 mg | ORAL_TABLET | Freq: Four times a day (QID) | ORAL | Status: DC | PRN
  Administered 2020-08-20 – 2020-08-21 (×2): 650 mg via ORAL
  Filled 2020-08-19 (×2): qty 2

## 2020-08-19 MED ORDER — ACETAMINOPHEN 650 MG RE SUPP: 650.0000 mg | Freq: Four times a day (QID) | RECTAL | Status: DC | PRN

## 2020-08-19 MED ORDER — LIDOCAINE HCL (PF) 1 % IJ SOLN
5.0000 mL | Freq: Once | INTRAMUSCULAR | Status: DC
  Filled 2020-08-19: qty 5

## 2020-08-19 NOTE — ED Notes (Signed)
Patient transported to CT 

## 2020-08-19 NOTE — ED Notes (Signed)
pts daughter updated at this time 

## 2020-08-19 NOTE — Progress Notes (Signed)
Orthopedic Tech Progress Note Patient Details:  Adam [name] Mar 22, 1931 [id number]  Patient ID: Adam [name], male   DOB: [birth year], 87 y.o.   MRN: [id number]   Adam [name] 08/19/2020, 9:33 PM trauma

## 2020-08-19 NOTE — ED Notes (Signed)
Attempt to give report x1.

## 2020-08-19 NOTE — ED Notes (Signed)
[name] (Daughter) of Adam [name] call and ask for updates on him as treatment continues. Her number is [phone].

## 2020-08-19 NOTE — Consult Note (Signed)
Neurosurgery Consultation  Reason for Consult: Subdural hematoma Referring Physician: Tyrone Apple  CC: Fall with head trauma  HPI: This is a 87 y.o. man w/ h/o PAF on apixiban, CKD4 that presents after a fall from standing, per pt's story it was a mechanical fall. He had a scalp lac and was therefore taken to the ED. Today he denies any headache / N / V, no new weakness, numbness, or parasthesias. But he does endorse severe left shoulder pain. The pain is worse with active/passive ROM of left shoulder, no radiation into the arm, localized near the [facility] junction, no associated hematoma, started hurting p the fall.    ROS: A 14 point ROS was performed and is negative except as noted in the HPI.   PMHx:  Past Medical History:  Diagnosis Date  . Anemia   . CHF (congestive heart failure) ([place])   . Chronic kidney disease   . Heart murmur   . Hypertension    FamHx:  Family History  Problem Relation Age of Onset  . CAD Father    SocHx:  reports that he has quit smoking. He has never used smokeless tobacco. He reports current alcohol use. He reports that he does not use drugs.  Exam: Vital signs in last 24 hours: Temp:  [97.2 F (36.2 C)-99.9 F (37.7 C)] 99.9 F (37.7 C) (05/21 0739) Pulse Rate:  [52-90] 82 (05/21 0739) Resp:  [10-18] 16 (05/21 0739) BP: (130-188)/(31-109) 130/52 (05/21 0739) SpO2:  [95 %-100 %] 98 % (05/21 0739) Weight:  [64 kg] 64 kg (05/20 2100) General: Awake, alert, cooperative, lying in bed in NAD Head: Normocephalic, some dried scalp blood with palpable scalp hematoma on the right in the posterior / occipital region HEENT: Neck supple without pain on ROM Pulmonary: breathing room air comfortably, no evidence of increased work of breathing Psych: affect full and reactive Abdomen: S NT ND Extremities: Warm and well perfused x4 Neuro: AOx3, PERRL, EOMI, FS Strength 5/5 x4 except pain limited in the LUE with shoulder movements, negative empty/full can tests,  very TTP over the mid to lateral 1/3 of the clavicle, no obvious associated hematoma, SILTx4   Assessment and Plan: 87 y.o. man s/p fall, on apixiban for PAF. [place] personally reviewed, which shows anterior parafalcine acute SDH measuring roughly 28mm.   -no acute neurosurgical intervention indicated at this time -repeat CTH this morning, if stable then can restart apixiban on 5/28 and will be okay for prophylactic dose subQ heparin, if indicated, starting 5/22 -please call with any concerns or questions  Judith Part, MD 08/20/20 9:34 AM [facility]

## 2020-08-19 NOTE — ED Notes (Addendum)
Pt back from Ct, continues on ccm. NADN

## 2020-08-19 NOTE — ED Provider Notes (Signed)
[facility] EMERGENCY DEPARTMENT Provider Note   CSN: [id number] Arrival date & time: 08/19/20  2052     History Chief Complaint  Patient presents with  . Fall    Adam [name] is a 87 y.o. male.  HPI  Presents with fall, loss of consciousness.  He does not recall what happened but he was found outside at his independent living facility after falling.  He is noted to have an injury to the back of his head.  EMS was called.  The patient knows where he is and complains only of left-sided shoulder pain on arrival.  He takes Eliquis for A. fib.  He denies other heart problems.  He has a history of passing out before but does not recall the details.  No neck pain, arm or leg numbness or weakness.  No chest pain or trouble breathing.  No headache, visual disturbance, nausea or vomiting     Past Medical History:  Diagnosis Date  . Anemia   . CHF (congestive heart failure) ([place])   . Chronic kidney disease   . Heart murmur   . Hypertension     Patient Active Problem List   Diagnosis Date Noted  . Intracranial bleed ([place]) 08/19/2020  . Subdural hematoma ([place]) 08/19/2020  . CKD (chronic kidney disease) stage 4, GFR 15-29 ml/min (HCC) 08/19/2020  . Anemia due to chronic kidney disease 08/19/2020    Past Surgical History:  Procedure Laterality Date  . HERNIA REPAIR         Family History  Problem Relation Age of Onset  . CAD Father     Social History   Tobacco Use  . Smoking status: Former Research scientist (life sciences)  . Smokeless tobacco: Never Used  Substance Use Topics  . Alcohol use: Yes  . Drug use: Never    Home Medications Prior to Admission medications   Not on File    Allergies    Patient has no allergy information on record.  Review of Systems   Review of Systems  Constitutional: Negative for chills and fever.  HENT: Negative for ear pain and sore throat.   Eyes: Negative for pain and visual disturbance.  Respiratory: Negative for cough and shortness of  breath.   Cardiovascular: Negative for chest pain and palpitations.  Gastrointestinal: Negative for abdominal pain and vomiting.  Genitourinary: Negative for dysuria and hematuria.  Musculoskeletal: Negative for arthralgias and back pain.  Skin: Negative for color change and rash.  Neurological: Negative for seizures and weakness.  All other systems reviewed and are negative.   Physical Exam Updated Vital Signs BP (!) 176/70   Pulse 86   Temp (!) 97.5 F (36.4 C) (Temporal)   Resp 13   Ht 5\' 4"  (1.626 m)   Wt 64 kg   SpO2 99%   BMI 24.20 kg/m   Physical Exam Vitals and nursing note reviewed.  Constitutional:      Appearance: He is well-developed.  HENT:     Head: Normocephalic.  Eyes:     Conjunctiva/sclera: Conjunctivae normal.  Cardiovascular:     Rate and Rhythm: Normal rate and regular rhythm.     Heart sounds: No murmur heard.   Pulmonary:     Effort: Pulmonary effort is normal. No respiratory distress.     Breath sounds: Normal breath sounds.  Abdominal:     Palpations: Abdomen is soft.     Tenderness: There is no abdominal tenderness.  Genitourinary:    Penis: Normal.  Testes: Normal.  Musculoskeletal:     Cervical back: Neck supple.     Right lower leg: No edema.     Left lower leg: No edema.     Comments: Exquisite left trapezius tenderness to palpation.  Left humeral head, clavicle, scapula are nontender to palpation and patient has range of motion of his left shoulder.  Skin:    General: Skin is warm and dry.  Neurological:     Mental Status: He is alert.     Comments:   MENTAL STATUS: AAOx3   LANG/SPEECH: Fluent, intact medical history with good comprehension   CRANIAL NERVES:   II: Pupils equal and reactive   III, IV, VI: EOM intact, no gaze preference or deviation   V: normal   VII: no facial asymmetry   VIII: normal hearing to speech   MOTOR: 5/5 in both upper and lower extremities   SENSORY: Equal and symmetric in bilateral upper and  lower extremities  Psychiatric:        Behavior: Behavior normal.        Thought Content: Thought content normal.   3cm vertical linear posterior scalp laceration Forehead stable to palpation PERRL EOMI bl Midface stable nontender No intraoral injury  C-spine nontender T-spine nontender L-spine nontender No stepoffs or deformity  Clavicles stable nontender bilaterally Chest wall stable to AP and lat compression Abdomen nontender Bilateral radial pulses, bilateral DP pulses intact  ED Results / Procedures / Treatments   Labs (all labs ordered are listed, but only abnormal results are displayed) Labs Reviewed  PROTIME-INR - Abnormal; Notable for the following components:      Result Value   Prothrombin Time 15.4 (*)    All other components within normal limits  CBC WITH DIFFERENTIAL/PLATELET - Abnormal; Notable for the following components:   RBC 2.99 (*)    Hemoglobin 9.7 (*)    HCT 29.7 (*)    All other components within normal limits  COMPREHENSIVE METABOLIC PANEL - Abnormal; Notable for the following components:   Glucose, Bld 119 (*)    BUN 59 (*)    Creatinine, Ser 4.01 (*)    GFR, Estimated 14 (*)    All other components within normal limits  SARS CORONAVIRUS 2 (TAT 6-24 HRS)  BASIC METABOLIC PANEL  CBC  TROPONIN I (HIGH SENSITIVITY)  TROPONIN I (HIGH SENSITIVITY)    EKG None  Radiology DG Pelvis 1-2 Views  Result Date: 08/19/2020 CLINICAL DATA:  Fall. EXAM: PELVIS - 1-2 VIEW COMPARISON:  None. FINDINGS: There is no evidence of pelvic fracture or diastasis. No pelvic bone lesions are seen. IMPRESSION: Negative. Electronically Signed   By: Marijo Conception M.D.   On: 08/19/2020 21:36   CT Head Wo Contrast  Result Date: 08/19/2020 CLINICAL DATA:  Fall while on anti coagulation. EXAM: CT HEAD WITHOUT CONTRAST CT CERVICAL SPINE WITHOUT CONTRAST TECHNIQUE: Multidetector CT imaging of the head and cervical spine was performed following the standard protocol  without intravenous contrast. Multiplanar CT image reconstructions of the cervical spine were also generated. COMPARISON:  None. FINDINGS: CT HEAD FINDINGS Brain: Thin anterior parafalcine subdural hematoma measuring 5 mm. No mass effect or midline shift. Diffuse atrophy with ex vacuo dilatation of the ventricles. There is hypoattenuation of the periventricular white matter, most commonly indicating chronic ischemic microangiopathy. Vascular: No abnormal hyperdensity of the major intracranial arteries or dural venous sinuses. No intracranial atherosclerosis. Skull: Right occipital scalp hematoma.  No skull fracture. Sinuses/Orbits: No fluid levels or advanced mucosal  thickening of the visualized paranasal sinuses. No mastoid or middle ear effusion. The orbits are normal. CT CERVICAL SPINE FINDINGS Alignment: No static subluxation. Facets are aligned. Occipital condyles are normally positioned. Skull base and vertebrae: No acute fracture. Soft tissues and spinal canal: No prevertebral fluid or swelling. No visible canal hematoma. Disc levels: No advanced spinal canal or neural foraminal stenosis. Upper chest: No pneumothorax, pulmonary nodule or pleural effusion. Other: Normal visualized paraspinal cervical soft tissues. IMPRESSION: 1. Thin anterior parafalcine subdural hematoma measuring 5 mm without mass effect or midline shift. 2. Right occipital scalp hematoma without skull fracture. 3. No acute fracture or static subluxation of the cervical spine. Critical Value/emergent results were called by telephone at the time of interpretation on 08/19/2020 at 9:32 pm to provider Aris Lot , who verbally acknowledged these results. Electronically Signed   By: Ulyses Jarred M.D.   On: 08/19/2020 21:34   CT Cervical Spine Wo Contrast  Result Date: 08/19/2020 CLINICAL DATA:  Fall while on anti coagulation. EXAM: CT HEAD WITHOUT CONTRAST CT CERVICAL SPINE WITHOUT CONTRAST TECHNIQUE: Multidetector CT imaging of the  head and cervical spine was performed following the standard protocol without intravenous contrast. Multiplanar CT image reconstructions of the cervical spine were also generated. COMPARISON:  None. FINDINGS: CT HEAD FINDINGS Brain: Thin anterior parafalcine subdural hematoma measuring 5 mm. No mass effect or midline shift. Diffuse atrophy with ex vacuo dilatation of the ventricles. There is hypoattenuation of the periventricular white matter, most commonly indicating chronic ischemic microangiopathy. Vascular: No abnormal hyperdensity of the major intracranial arteries or dural venous sinuses. No intracranial atherosclerosis. Skull: Right occipital scalp hematoma.  No skull fracture. Sinuses/Orbits: No fluid levels or advanced mucosal thickening of the visualized paranasal sinuses. No mastoid or middle ear effusion. The orbits are normal. CT CERVICAL SPINE FINDINGS Alignment: No static subluxation. Facets are aligned. Occipital condyles are normally positioned. Skull base and vertebrae: No acute fracture. Soft tissues and spinal canal: No prevertebral fluid or swelling. No visible canal hematoma. Disc levels: No advanced spinal canal or neural foraminal stenosis. Upper chest: No pneumothorax, pulmonary nodule or pleural effusion. Other: Normal visualized paraspinal cervical soft tissues. IMPRESSION: 1. Thin anterior parafalcine subdural hematoma measuring 5 mm without mass effect or midline shift. 2. Right occipital scalp hematoma without skull fracture. 3. No acute fracture or static subluxation of the cervical spine. Critical Value/emergent results were called by telephone at the time of interpretation on 08/19/2020 at 9:32 pm to provider Aris Lot , who verbally acknowledged these results. Electronically Signed   By: Ulyses Jarred M.D.   On: 08/19/2020 21:34   DG Chest Portable 1 View  Result Date: 08/19/2020 CLINICAL DATA:  Fall. EXAM: PORTABLE CHEST 1 VIEW COMPARISON:  September 06, 2019. FINDINGS: The  heart size and mediastinal contours are within normal limits. Both lungs are clear. The visualized skeletal structures are unremarkable. IMPRESSION: No active disease. Electronically Signed   By: Marijo Conception M.D.   On: 08/19/2020 21:33   DG Shoulder Left Portable  Result Date: 08/19/2020 CLINICAL DATA:  Left shoulder pain after fall. EXAM: LEFT SHOULDER COMPARISON:  None. FINDINGS: There is no evidence of fracture or dislocation. Mild degenerative changes seen involving the left acromioclavicular joint. Soft tissues are unremarkable. 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 No acute abnormality seen. Electronically Signed   By: Marijo Conception M.D.   On: 08/19/2020 21:35    Procedures .Marland KitchenLaceration Repair  Date/Time: 08/20/2020 12:12 AM Performed by: Tyrone Apple,  Mitzi Hansen, MD Authorized by: Merrily Pew, MD   Consent:    Consent obtained:  Verbal   Consent given by:  Patient   Risks discussed:  Pain and poor cosmetic result Universal protocol:    Patient identity confirmed:  Verbally with patient Laceration details:    Location:  Scalp   Scalp location:  Occipital   Length (cm):  4 Exploration:    Wound extent: no underlying fracture noted and no vascular damage noted     Contaminated: no   Treatment:    Area cleansed with:  Saline   Amount of cleaning:  Standard   Irrigation solution:  Sterile saline   Irrigation volume:  500cc   Irrigation method:  Pressure wash   Visualized foreign bodies/material removed: no     Debridement:  None   Undermining:  None   Scar revision: no   Skin repair:    Repair method:  Staples   Number of staples:  5 Approximation:    Approximation:  Close Repair type:    Repair type:  Simple Post-procedure details:    Procedure completion:  Tolerated    Medications Ordered in ED Medications  acetaminophen (TYLENOL) tablet 650 mg (has no administration in time range)    Or  acetaminophen (TYLENOL) suppository 650  mg (has no administration in time range)  hydrALAZINE (APRESOLINE) injection 10 mg (10 mg Intravenous Given 08/20/20 0005)    ED Course  I have reviewed the triage vital signs and the nursing notes.  Pertinent labs & imaging results that were available during my care of the patient were reviewed by me and considered in my medical decision making (see chart for details).    MDM Rules/Calculators/A&P                          Patient presents with fall, likely syncope.  Posterior head laceration.  Takes blood thinners so will obtain CT head and CT C-spine.  Patient will also need laceration repair and syncope work-up.  CT head shows 5 mm parafalcine subdural without midline shift.  Labs show elevated creatinine on chart review he has had this previously.  Discussed with neurosurgery who recommends admission for repeat CT head in 12 hours.  Given the need for syncope work-up, plan is to admit to hospitalist    Final Clinical Impression(s) / ED Diagnoses Final diagnoses:  None    Rx / DC Orders ED Discharge Orders    None       Aris Lot, MD 08/20/20 0013    Merrily Pew, MD 08/20/20 1505

## 2020-08-19 NOTE — ED Triage Notes (Signed)
Level 2 activation prior to EMS arrival. Pt arrived via [facility]. EMS reports pt is from [facility] facility. Per EMS pt had unwitnessed fall with unknown downtime. Injury to the back of the head with c/o left shoulder pain as well. Unknown LOC, pt states he does not remember falling or what caused him to fall. Pt arrived to ED with GCS 15, no obvious neuro deficit, VS stable.

## 2020-08-20 ENCOUNTER — Observation Stay (HOSPITAL_COMMUNITY): Payer: Medicare Other

## 2020-08-20 DIAGNOSIS — I629 Nontraumatic intracranial hemorrhage, unspecified: Secondary | ICD-10-CM

## 2020-08-20 DIAGNOSIS — S065X9A Traumatic subdural hemorrhage with loss of consciousness of unspecified duration, initial encounter: Secondary | ICD-10-CM | POA: Diagnosis not present

## 2020-08-20 DIAGNOSIS — S0101XA Laceration without foreign body of scalp, initial encounter: Secondary | ICD-10-CM | POA: Diagnosis not present

## 2020-08-20 DIAGNOSIS — D631 Anemia in chronic kidney disease: Secondary | ICD-10-CM

## 2020-08-20 DIAGNOSIS — N184 Chronic kidney disease, stage 4 (severe): Secondary | ICD-10-CM

## 2020-08-20 LAB — BASIC METABOLIC PANEL
Anion gap: 15 (ref 5–15)
BUN: 59 mg/dL — ABNORMAL HIGH (ref 8–23)
CO2: 21 mmol/L — ABNORMAL LOW (ref 22–32)
Calcium: 9 mg/dL (ref 8.9–10.3)
Chloride: 101 mmol/L (ref 98–111)
Creatinine, Ser: 3.99 mg/dL — ABNORMAL HIGH (ref 0.61–1.24)
GFR, Estimated: 14 mL/min — ABNORMAL LOW (ref >60)
Glucose, Bld: 121 mg/dL — ABNORMAL HIGH (ref 70–99)
Potassium: 3.6 mmol/L (ref 3.5–5.1)
Sodium: 137 mmol/L (ref 135–145)

## 2020-08-20 LAB — CBC
HCT: 29.1 % — ABNORMAL LOW (ref 39.0–52.0)
Hemoglobin: 9.8 g/dL — ABNORMAL LOW (ref 13.0–17.0)
MCH: 33 pg (ref 26.0–34.0)
MCHC: 33.7 g/dL (ref 30.0–36.0)
MCV: 98 fL (ref 80.0–100.0)
Platelets: 198 10*3/uL (ref 150–400)
RBC: 2.97 MIL/uL — ABNORMAL LOW (ref 4.22–5.81)
RDW: 13.6 % (ref 11.5–15.5)
WBC: 15.2 10*3/uL — ABNORMAL HIGH (ref 4.0–10.5)
nRBC: 0 % (ref 0.0–0.2)

## 2020-08-20 LAB — SARS CORONAVIRUS 2 (TAT 6-24 HRS): SARS Coronavirus 2: NEGATIVE

## 2020-08-20 LAB — TROPONIN I (HIGH SENSITIVITY): Troponin I (High Sensitivity): 16 ng/L (ref <18)

## 2020-08-20 MED ORDER — AMIODARONE HCL 200 MG PO TABS
200.0000 mg | ORAL_TABLET | Freq: Two times a day (BID) | ORAL | Status: DC
  Administered 2020-08-20 – 2020-08-21 (×4): 200 mg via ORAL
  Filled 2020-08-20 (×4): qty 1

## 2020-08-20 MED ORDER — BRIMONIDINE TARTRATE 0.2 % OP SOLN
1.0000 [drp] | Freq: Two times a day (BID) | OPHTHALMIC | Status: DC
  Administered 2020-08-20 – 2020-08-21 (×3): 1 [drp] via OPHTHALMIC
  Filled 2020-08-20: qty 5

## 2020-08-20 MED ORDER — ATORVASTATIN CALCIUM 10 MG PO TABS
20.0000 mg | ORAL_TABLET | Freq: Every day | ORAL | Status: DC
  Administered 2020-08-20 – 2020-08-21 (×2): 20 mg via ORAL
  Filled 2020-08-20 (×2): qty 2

## 2020-08-20 MED ORDER — GABAPENTIN 100 MG PO CAPS
100.0000 mg | ORAL_CAPSULE | Freq: Three times a day (TID) | ORAL | Status: DC
  Administered 2020-08-20 – 2020-08-21 (×4): 100 mg via ORAL
  Filled 2020-08-20 (×4): qty 1

## 2020-08-20 MED ORDER — BRIMONIDINE TARTRATE-TIMOLOL 0.2-0.5 % OP SOLN
1.0000 [drp] | Freq: Two times a day (BID) | OPHTHALMIC | Status: DC
  Filled 2020-08-20: qty 5

## 2020-08-20 MED ORDER — NETARSUDIL-LATANOPROST 0.02-0.005 % OP SOLN: 1.0000 [drp] | Freq: Every day | OPHTHALMIC | Status: DC

## 2020-08-20 MED ORDER — TIMOLOL MALEATE 0.5 % OP SOLN
1.0000 [drp] | Freq: Two times a day (BID) | OPHTHALMIC | Status: DC
  Administered 2020-08-20 – 2020-08-21 (×3): 1 [drp] via OPHTHALMIC
  Filled 2020-08-20: qty 5

## 2020-08-20 MED ORDER — FINASTERIDE 5 MG PO TABS
5.0000 mg | ORAL_TABLET | Freq: Every day | ORAL | Status: DC
  Administered 2020-08-20 – 2020-08-21 (×2): 5 mg via ORAL
  Filled 2020-08-20 (×2): qty 1

## 2020-08-20 MED ORDER — FUROSEMIDE 40 MG PO TABS: 40.0000 mg | ORAL_TABLET | Freq: Three times a day (TID) | ORAL | Status: DC

## 2020-08-20 MED ORDER — FEBUXOSTAT 40 MG PO TABS
40.0000 mg | ORAL_TABLET | Freq: Every day | ORAL | Status: DC
  Administered 2020-08-20 – 2020-08-21 (×2): 40 mg via ORAL
  Filled 2020-08-20 (×3): qty 1

## 2020-08-20 MED ORDER — LATANOPROST 0.005 % OP SOLN
1.0000 [drp] | Freq: Every day | OPHTHALMIC | Status: DC
  Administered 2020-08-20: 1 [drp] via OPHTHALMIC
  Filled 2020-08-20: qty 2.5

## 2020-08-20 MED ORDER — HYDRALAZINE HCL 50 MG PO TABS
100.0000 mg | ORAL_TABLET | Freq: Two times a day (BID) | ORAL | Status: DC
  Administered 2020-08-20 – 2020-08-21 (×4): 100 mg via ORAL
  Filled 2020-08-20 (×4): qty 2

## 2020-08-20 NOTE — Progress Notes (Signed)
  PROGRESS NOTE     Same day note  Patient seen and examined at bedside.  Patient was admitted to the hospital for fall.  At the time of my evaluation, patient complains of left shoulder/ clavicular pain.  No headache nausea vomiting.  Physical examination reveals male with scalp hematoma.  Pain on palpation of the left clavicular region.  External bruise noted.  Laboratory data and imaging was reviewed,  Assessment/Plan:  Principal Problem:   Subdural hematoma (HCC) Active Problems:   Intracranial bleed (HCC)   CKD (chronic kidney disease) stage 4, GFR 15-29 ml/min (HCC)   Anemia due to chronic kidney disease  Subdural hematoma status post mechanical fall.   Patient was on Eliquis.  Continue to hold Eliquis.  Neurosurgery on board.  Repeat CT scan was ordered in 12 hours shows improving subdural and subarachnoid hemorrhage, spoke with Dr. Venetia Constable neurosurgery.    Scalp hematoma and laceration status post sutures.  Sutures to be removed in 1 week from 08/20/2020  Left shoulder pain x-rays are negative for any fracture.    Patient continues to feel local pain and tenderness around the clavicle area.  We will get clavicular x-ray.  Might benefit from splint  Acute on chronic kidney disease stage IV.  Creatinine has worsened from 3-4 at this time.  Continue to hold Lasix.    Anemia likely from renal disease.  Follow CBC.  Paroxysmal atrial fibrillation presently in sinus rhythm on amiodarone.  Eliquis on hold at this time.  Chronic diastolic CHF last 2D echocardiogram on May 2021 showed EF of 60 to 65%.  Lasix currently on hold  Essential hypertension on hydralazine.  Closely monitor blood pressure.  Blood pressure appears to be better now.  History of gout on Ulor[place].  No Charge  Signed,  Adam Pereyra, MD [facility]  DVT prophylaxis: SCDs Start: 08/19/20 2332

## 2020-08-20 NOTE — TOC CAGE-AID Note (Signed)
Transition of Care [facility]) - CAGE-AID Screening   Patient Details  Name: Adam [name] MRN: [id number] Date of Birth: [birth year]  Clinical Narrative:  Patient endorses some alcohol use, states "I may have a glass of wine with dinner". Denies any drug use or need for substance abuse education.  CAGE-AID Screening:    Have You Ever Felt You Ought to Cut Down on Your Drinking or Drug Use?: No Have People Annoyed You By Critizing Your Drinking Or Drug Use?: No Have You Felt Bad Or Guilty About Your Drinking Or Drug Use?: No Have You Ever Had a Drink or Used Drugs First Thing In The Morning to Steady Your Nerves or to Get Rid of a Hangover?: No CAGE-AID Score: 0  Substance Abuse Education Offered: No

## 2020-08-20 NOTE — H&P (Signed)
History and Physical    Adam [name] VOJ:[id number] DOB: 07-19-1930 DOA: 08/19/2020  PCP: Javier Glazier, MD  Patient coming from: [facility] living facility.  Chief Complaint: Fall.  HPI: Adam [name] is a 87 y.o. male with history of paroxysmal atrial fibrillation on Eliquis, chronic diastolic CHF, chronic kidney disease stage IV had a fall at his living facility when he tripped and fell.  Patient states he did not have any symptoms of dizziness palpitations chest pain or shortness of breath prior to fall and he clearly fell after tripping.  He may have lost consciousness.  He was brought to the ER.  ED Course: In the ER CT head shows anterior parafalcine subdural hematoma and scalp hematoma.  Patient had a laceration on the scalp for which ER physician sutured.  On-call neurosurgeon was consulted and requested admission for further observation given that patient also takes Eliquis.  Last dose was yesterday morning.  EKG shows normal sinus rhythm with trigeminy creatinine had worsened from October when it was 3 it is around 4 now.  Hemoglobin is around 9.7.  COVID test is negative.  Patient does complain of left shoulder pain x-rays are negative for any fractures.  Review of Systems: As per HPI, rest all negative.   Past Medical History:  Diagnosis Date  . Anemia   . CHF (congestive heart failure) ([place])   . Chronic kidney disease   . Heart murmur   . Hypertension     Past Surgical History:  Procedure Laterality Date  . HERNIA REPAIR       reports that he has quit smoking. He has never used smokeless tobacco. He reports current alcohol use. He reports that he does not use drugs.  No Known Allergies  Family History  Problem Relation Age of Onset  . CAD Father     Prior to Admission medications   Medication Sig Start Date End Date Taking? Authorizing Provider  atorvastatin (LIPITOR) 20 MG tablet Take 1 tablet by mouth daily. 08/10/20  Yes [provider]   brimonidine-timolol (COMBIGAN) 0.2-0.5 % ophthalmic solution Place 1 drop into the left eye 2 (two) times daily.   Yes [provider]  ELIQUIS 2.5 MG TABS tablet Take 2.5 mg by mouth 2 (two) times daily. 05/19/20  Yes [provider]  febuxostat (ULORIC) 40 MG tablet Take 40 mg by mouth daily. 07/28/20  Yes [provider]  finasteride (PROSCAR) 5 MG tablet Take 5 mg by mouth daily. 07/28/20  Yes [provider]  furosemide (LASIX) 40 MG tablet Take 40 mg by mouth 3 (three) times daily. 06/28/20  Yes [provider]  gabapentin (NEURONTIN) 100 MG capsule Take 100 mg by mouth 3 (three) times daily. 04/15/20  Yes [provider]  hydrALAZINE (APRESOLINE) 100 MG tablet Take 100 mg by mouth 2 (two) times daily. 05/29/20  Yes [provider]  Multiple Vitamins-Minerals (PRESERVISION AREDS 2) CAPS Take 1 capsule by mouth daily.   Yes [provider]  Netarsudil-Latanoprost 0.02-0.005 % SOLN Place 1 drop into the left eye daily.   Yes [provider]  PACERONE 200 MG tablet Take 200 mg by mouth 2 (two) times daily. 03/08/20  Yes [provider]    Physical Exam: Constitutional: Moderately built and nourished. Vitals:   08/20/20 0030 08/20/20 0034 08/20/20 0115 08/20/20 0351  BP: (!) 154/64  (!) 149/67 132/66  Pulse: 89  87 86  Resp: 13   14  Temp:  (!) 97.2 F (36.2  C) 97.7 F (36.5 C) 98.6 F (37 C)  TempSrc:  Temporal Oral Oral  SpO2: 97%  98% 99%  Weight:      Height:       Eyes: Anicteric no pallor. ENMT: No discharge from the ears eyes nose and mouth.  Scalp hematoma. Neck: No mass felt.  No neck rigidity. Respiratory: No rhonchi or crepitations. Cardiovascular: S1-S2 heard. Abdomen: Soft nontender bowel sounds present. Musculoskeletal: Pain on moving left shoulder. Skin: No rash. Neurologic: Alert awake oriented to time place and person.  Moving all extremities. Psychiatric: Appears normal.   Normal affect.   Labs on Admission: I have personally reviewed following labs and imaging studies  CBC: Recent Labs  Lab 08/19/20 2113 08/20/20 0057  WBC 7.7 15.2*  NEUTROABS 4.5  --   HGB 9.7* 9.8*  HCT 29.7* 29.1*  MCV 99.3 98.0  PLT 202 094   Basic Metabolic Panel: Recent Labs  Lab 08/19/20 2113 08/20/20 0057  NA 137 137  K 3.8 3.6  CL 102 101  CO2 25 21*  GLUCOSE 119* 121*  BUN 59* 59*  CREATININE 4.01* 3.99*  CALCIUM 8.9 9.0   GFR: Estimated Creatinine Clearance: 10.7 mL/min (A) (by C-G formula based on SCr of 3.99 mg/dL (H)). Liver Function Tests: Recent Labs  Lab 08/19/20 2113  AST 18  ALT 24  ALKPHOS 58  BILITOT 0.7  PROT 6.5  ALBUMIN 4.0   No results for input(s): LIPASE, AMYLASE in the last 168 hours. No results for input(s): AMMONIA in the last 168 hours. Coagulation Profile: Recent Labs  Lab 08/19/20 2113  INR 1.2   Cardiac Enzymes: No results for input(s): CKTOTAL, CKMB, CKMBINDEX, TROPONINI in the last 168 hours. BNP (last 3 results) No results for input(s): PROBNP in the last 8760 hours. HbA1C: No results for input(s): HGBA1C in the last 72 hours. CBG: No results for input(s): GLUCAP in the last 168 hours. Lipid Profile: No results for input(s): CHOL, HDL, LDLCALC, TRIG, CHOLHDL, LDLDIRECT in the last 72 hours. Thyroid Function Tests: No results for input(s): TSH, T4TOTAL, FREET4, T3FREE, THYROIDAB in the last 72 hours. Anemia Panel: No results for input(s): VITAMINB12, FOLATE, FERRITIN, TIBC, IRON, RETICCTPCT in the last 72 hours. Urine analysis: No results found for: COLORURINE, APPEARANCEUR, LABSPEC, PHURINE, GLUCOSEU, HGBUR, BILIRUBINUR, KETONESUR, PROTEINUR, UROBILINOGEN, NITRITE, LEUKOCYTESUR Sepsis Labs: @LABRCNTIP (procalcitonin:4,lacticidven:4) ) Recent Results (from the past 240 hour(s))  SARS CORONAVIRUS 2 (TAT 6-24 HRS) Nasopharyngeal Nasopharyngeal Swab     Status: None   Collection Time: 08/19/20 10:06 PM    Specimen: Nasopharyngeal Swab  Result Value Ref Range Status   SARS Coronavirus 2 NEGATIVE NEGATIVE Final    Comment: (NOTE) SARS-CoV-2 target nucleic acids are NOT DETECTED.  The SARS-CoV-2 RNA is generally detectable in upper and lower respiratory specimens during the acute phase of infection. Negative results do not preclude SARS-CoV-2 infection, do not rule out co-infections with other pathogens, and should not be used as the sole basis for treatment or other patient management decisions. Negative results must be combined with clinical observations, patient history, and epidemiological information. The expected result is Negative.  Fact Sheet for Patients: SugarRoll.be  Fact Sheet for Healthcare Providers: https://www.woods-mathews.com/  This test is not yet approved or cleared by the [place] FDA and  has been authorized for detection and/or diagnosis of SARS-CoV-2 by FDA under an Emergency Use Authorization (EUA). This EUA will remain  in effect (meaning this test can be used) for the duration of the COVID-19 declaration  under Se ction 564(b)(1) of the Act, 21 U.S.C. section 360bbb-3(b)(1), unless the authorization is terminated or revoked sooner.  Performed at [facility], [place] [address], [place], [place] [address]      Radiological Exams on Admission: DG Pelvis 1-2 Views  Result Date: 08/19/2020 CLINICAL DATA:  Fall. EXAM: PELVIS - 1-2 VIEW COMPARISON:  None. FINDINGS: There is no evidence of pelvic fracture or diastasis. No pelvic bone lesions are seen. IMPRESSION: Negative. Electronically Signed   By: Marijo Conception M.D.   On: 08/19/2020 21:36   CT Head Wo Contrast  Result Date: 08/19/2020 CLINICAL DATA:  Fall while on anti coagulation. EXAM: CT HEAD WITHOUT CONTRAST CT CERVICAL SPINE WITHOUT CONTRAST TECHNIQUE: Multidetector CT imaging of the head and cervical spine was performed following the standard protocol  without intravenous contrast. Multiplanar CT image reconstructions of the cervical spine were also generated. COMPARISON:  None. FINDINGS: CT HEAD FINDINGS Brain: Thin anterior parafalcine subdural hematoma measuring 5 mm. No mass effect or midline shift. Diffuse atrophy with ex vacuo dilatation of the ventricles. There is hypoattenuation of the periventricular white matter, most commonly indicating chronic ischemic microangiopathy. Vascular: No abnormal hyperdensity of the major intracranial arteries or dural venous sinuses. No intracranial atherosclerosis. Skull: Right occipital scalp hematoma.  No skull fracture. Sinuses/Orbits: No fluid levels or advanced mucosal thickening of the visualized paranasal sinuses. No mastoid or middle ear effusion. The orbits are normal. CT CERVICAL SPINE FINDINGS Alignment: No static subluxation. Facets are aligned. Occipital condyles are normally positioned. Skull base and vertebrae: No acute fracture. Soft tissues and spinal canal: No prevertebral fluid or swelling. No visible canal hematoma. Disc levels: No advanced spinal canal or neural foraminal stenosis. Upper chest: No pneumothorax, pulmonary nodule or pleural effusion. Other: Normal visualized paraspinal cervical soft tissues. IMPRESSION: 1. Thin anterior parafalcine subdural hematoma measuring 5 mm without mass effect or midline shift. 2. Right occipital scalp hematoma without skull fracture. 3. No acute fracture or static subluxation of the cervical spine. Critical Value/emergent results were called by telephone at the time of interpretation on 08/19/2020 at 9:32 pm to provider Aris Lot , who verbally acknowledged these results. Electronically Signed   By: Ulyses Jarred M.D.   On: 08/19/2020 21:34   CT Cervical Spine Wo Contrast  Result Date: 08/19/2020 CLINICAL DATA:  Fall while on anti coagulation. EXAM: CT HEAD WITHOUT CONTRAST CT CERVICAL SPINE WITHOUT CONTRAST TECHNIQUE: Multidetector CT imaging of the  head and cervical spine was performed following the standard protocol without intravenous contrast. Multiplanar CT image reconstructions of the cervical spine were also generated. COMPARISON:  None. FINDINGS: CT HEAD FINDINGS Brain: Thin anterior parafalcine subdural hematoma measuring 5 mm. No mass effect or midline shift. Diffuse atrophy with ex vacuo dilatation of the ventricles. There is hypoattenuation of the periventricular white matter, most commonly indicating chronic ischemic microangiopathy. Vascular: No abnormal hyperdensity of the major intracranial arteries or dural venous sinuses. No intracranial atherosclerosis. Skull: Right occipital scalp hematoma.  No skull fracture. Sinuses/Orbits: No fluid levels or advanced mucosal thickening of the visualized paranasal sinuses. No mastoid or middle ear effusion. The orbits are normal. CT CERVICAL SPINE FINDINGS Alignment: No static subluxation. Facets are aligned. Occipital condyles are normally positioned. Skull base and vertebrae: No acute fracture. Soft tissues and spinal canal: No prevertebral fluid or swelling. No visible canal hematoma. Disc levels: No advanced spinal canal or neural foraminal stenosis. Upper chest: No pneumothorax, pulmonary nodule or pleural effusion. Other: Normal visualized paraspinal cervical  soft tissues. IMPRESSION: 1. Thin anterior parafalcine subdural hematoma measuring 5 mm without mass effect or midline shift. 2. Right occipital scalp hematoma without skull fracture. 3. No acute fracture or static subluxation of the cervical spine. Critical Value/emergent results were called by telephone at the time of interpretation on 08/19/2020 at 9:32 pm to provider Aris Lot , who verbally acknowledged these results. Electronically Signed   By: Ulyses Jarred M.D.   On: 08/19/2020 21:34   DG Chest Portable 1 View  Result Date: 08/19/2020 CLINICAL DATA:  Fall. EXAM: PORTABLE CHEST 1 VIEW COMPARISON:  September 06, 2019. FINDINGS: The  heart size and mediastinal contours are within normal limits. Both lungs are clear. The visualized skeletal structures are unremarkable. IMPRESSION: No active disease. Electronically Signed   By: Marijo Conception M.D.   On: 08/19/2020 21:33   DG Shoulder Left Portable  Result Date: 08/19/2020 CLINICAL DATA:  Left shoulder pain after fall. EXAM: LEFT SHOULDER COMPARISON:  None. FINDINGS: There is no evidence of fracture or dislocation. Mild degenerative changes seen involving the left acromioclavicular joint. Soft tissues are unremarkable. 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 No acute abnormality seen. Electronically Signed   By: Marijo Conception M.D.   On: 08/19/2020 21:35    EKG: Independently reviewed.  Normal sinus rhythm and trigeminy.  Assessment/Plan Principal Problem:   Subdural hematoma (HCC) Active Problems:   Intracranial bleed (HCC)   CKD (chronic kidney disease) stage 4, GFR 15-29 ml/min (HCC)   Anemia due to chronic kidney disease    1. Subdural hematoma after mechanical fall in the setting of Eliquis.  Neurosurgeon Dr. Venetia Constable was consulted and requested repeat CT head in 12 hours.  We will hold off Eliquis.  Continue to monitor. 2. Scalp hematoma and laceration requiring suturing.  Note that sutures had to be removed after 1 week. 3. Left shoulder pain x-rays are negative for any fracture.  If continues to hurt may need further imaging. 4. Acute on chronic kidney disease stage IV creatinine has worsened from 3 in October 2021 it is around 4 now.  We will hold Lasix and continue to observe. 5. Anemia likely from renal disease follow CBC. 6. Paroxysmal atrial fibrillation presently in sinus rhythm on amiodarone.  Holding Eliquis due to subdural hematoma.  Patient agrees to holding Eliquis. 7. Chronic diastolic CHF last EF measured was 60 to 65% with grade 2 diastolic dysfunction in May 2021.  Holding Lasix due to worsening renal  function. 8. Hypertension on hydralazine. 9. Gout on Uloric. 10. History of moderate AAS.   DVT prophylaxis: SCDs.  Avoiding anticoagulation in the setting of intracranial bleed. Code Status: Full code. Family Communication: Discussed with patient. Disposition Plan: Back to facility when stable. Consults called: Neurosurgery. Admission status: Observation.   Rise Patience MD [facility] Pager [phone].  If 7PM-7AM, please contact night-coverage [email or web address] Password [facility]  08/20/2020, 4:12 AM

## 2020-08-20 NOTE — Plan of Care (Signed)

## 2020-08-21 DIAGNOSIS — S065X9A Traumatic subdural hemorrhage with loss of consciousness of unspecified duration, initial encounter: Secondary | ICD-10-CM | POA: Diagnosis not present

## 2020-08-21 DIAGNOSIS — N184 Chronic kidney disease, stage 4 (severe): Secondary | ICD-10-CM | POA: Diagnosis not present

## 2020-08-21 DIAGNOSIS — S0101XA Laceration without foreign body of scalp, initial encounter: Secondary | ICD-10-CM | POA: Diagnosis not present

## 2020-08-21 DIAGNOSIS — D631 Anemia in chronic kidney disease: Secondary | ICD-10-CM | POA: Diagnosis not present

## 2020-08-21 DIAGNOSIS — I629 Nontraumatic intracranial hemorrhage, unspecified: Secondary | ICD-10-CM | POA: Diagnosis not present

## 2020-08-21 LAB — BASIC METABOLIC PANEL
Anion gap: 11 (ref 5–15)
BUN: 65 mg/dL — ABNORMAL HIGH (ref 8–23)
CO2: 25 mmol/L (ref 22–32)
Calcium: 8.5 mg/dL — ABNORMAL LOW (ref 8.9–10.3)
Chloride: 102 mmol/L (ref 98–111)
Creatinine, Ser: 3.98 mg/dL — ABNORMAL HIGH (ref 0.61–1.24)
GFR, Estimated: 14 mL/min — ABNORMAL LOW (ref >60)
Glucose, Bld: 110 mg/dL — ABNORMAL HIGH (ref 70–99)
Potassium: 3.6 mmol/L (ref 3.5–5.1)
Sodium: 138 mmol/L (ref 135–145)

## 2020-08-21 LAB — CBC
HCT: 25.1 % — ABNORMAL LOW (ref 39.0–52.0)
Hemoglobin: 8.3 g/dL — ABNORMAL LOW (ref 13.0–17.0)
MCH: 32.7 pg (ref 26.0–34.0)
MCHC: 33.1 g/dL (ref 30.0–36.0)
MCV: 98.8 fL (ref 80.0–100.0)
Platelets: 169 10*3/uL (ref 150–400)
RBC: 2.54 MIL/uL — ABNORMAL LOW (ref 4.22–5.81)
RDW: 13.5 % (ref 11.5–15.5)
WBC: 8.5 10*3/uL (ref 4.0–10.5)
nRBC: 0 % (ref 0.0–0.2)

## 2020-08-21 LAB — PHOSPHORUS: Phosphorus: 5.1 mg/dL — ABNORMAL HIGH (ref 2.5–4.6)

## 2020-08-21 LAB — MAGNESIUM: Magnesium: 2.6 mg/dL — ABNORMAL HIGH (ref 1.7–2.4)

## 2020-08-21 MED ORDER — LIDOCAINE 5 % EX PTCH: 1.0000 | MEDICATED_PATCH | CUTANEOUS | Status: DC

## 2020-08-21 MED ORDER — LIDOCAINE 5 % EX PTCH: 1.0000 | MEDICATED_PATCH | CUTANEOUS | 0 refills | Status: AC

## 2020-08-21 MED ORDER — POLYETHYLENE GLYCOL 3350 17 G PO PACK: 17.0000 g | PACK | Freq: Every day | ORAL | Status: DC

## 2020-08-21 MED ORDER — ACETAMINOPHEN 500 MG PO TABS: 500.0000 mg | ORAL_TABLET | Freq: Four times a day (QID) | ORAL | 2 refills | Status: AC | PRN

## 2020-08-21 MED ORDER — POLYETHYLENE GLYCOL 3350 17 G PO PACK: 17.0000 g | PACK | Freq: Every day | ORAL | 0 refills | Status: AC

## 2020-08-21 NOTE — Discharge Summary (Signed)
Physician Discharge Summary  Adam [name]:[id number] DOB: May 16, 1930 DOA: 08/19/2020  PCP: Javier Glazier, MD  Admit date: 08/19/2020 Discharge date: 08/21/2020  Admitted From: Home  Discharge disposition: Home health PT  Recommendations for Outpatient Follow-Up:   . Follow up with your primary care provider in one week.  . Check CBC, BMP, magnesium in the next visit . Follow-up with Dr. [name], cardiology as outpatient to discuss about anticoagulation.  Anticoagulation on hold until seen by cardiology. [name] will need to be removed on 08/28/2020   Discharge Diagnosis:   Principal Problem:   Subdural hematoma (HCC) Active Problems:   Intracranial bleed (HCC)   CKD (chronic kidney disease) stage 4, GFR 15-29 ml/min (HCC)   Anemia due to chronic kidney disease  Discharge Condition: Improved.  Diet recommendation: Low sodium, heart healthy.    Wound care: Remove the scalp laceration with staples in 1 week.  Code status: Full.  History of Present Illness:   Adam [name] is a 87 y.o. male with history of paroxysmal atrial fibrillation on Eliquis, chronic diastolic CHF, chronic kidney disease stage IV had a fall at his living facility when he tripped and fell.  Patient states he did not have any symptoms of dizziness, palpitations chest pain or shortness of breath prior to fall and he clearly fell after tripping.  He may have lost consciousness.  He was brought to the ER. In the ER, CT head shows anterior parafalcine subdural hematoma and scalp hematoma.  Patient had a laceration on the scalp for which ER physician sutured.  On-call neurosurgeon was consulted and requested admission for further observation, given that patient also was taking Eliquis.   EKG showed normal sinus rhythm with trigeminy,c elevated creatinine. COVID test was negative.  Patient does complain of left shoulder pain but x-rays were negative for any fractures.   Hospital Course:   Following conditions  were addressed during hospitalization as listed below,  Subdural hematoma status post mechanical fall.   Patient was on Eliquis.    Will continue to hold Eliquis until seen by his primary cardiologist as outpatient..  Neurosurgery followed the patient during hospitalization and recommended conservative treatment.  Repeat CT scan was ordered in 12 hours showed improving subdural and subarachnoid hemorrhage, spoke with Dr. Venetia Constable neurosurgery.    Scalp hematoma and laceration status post staples.    Staples to be removed in 1 week from 08/20/2020  Left shoulder pain x-rays are negative for any fracture.   Patient continues to feel local pain and tenderness around the clavicle area but better today.  X-ray of the clavicle did not reveal any fracture.  He was able to use walker for ambulation.  Advised to Lidoderm patches and Tylenol on discharge..  Acute on chronic kidney disease stage IV.    Resume Lasix as outpatient.Anemia likely from renal disease.    Will need to follow-up as outpatient  Paroxysmal atrial fibrillation  remained in sinus rhythm on amiodarone.  Eliquis on hold at this time.  Chronic diastolic CHF last 2D echocardiogram on May 2021 showed EF of 60 to 65%.    Will resume Lasix on discharge  Essential hypertension on hydralazine.   History of gout on Uloric.  Falls, weakness.  Patient was seen by physical therapy who recommended home PT on discharge.  He was able to ambulate with the help of walker on discharge.  Disposition.  At this time, patient is stable for disposition with home health.  Spoke with the patient's  daughter prior to discharge.  Medical Consultants:    Neurosurgery  Procedures:    None Subjective:   Today, patient was seen and examined at bedside.  Complains of mild shoulder pain.  Wishes to go home.  Slept okay.  Has not had a bowel movement.  Discharge Exam:   Vitals:   08/21/20 0735 08/21/20 1134  BP: (!) 175/74 (!) 117/55   Pulse: 66 (!) 57  Resp: 18 18  Temp: 97.9 F (36.6 C) 98.2 F (36.8 C)  SpO2: 96% 99%   Vitals:   08/20/20 2305 08/21/20 0354 08/21/20 0735 08/21/20 1134  BP: (!) 131/45 (!) 154/63 (!) 175/74 (!) 117/55  Pulse: 66 62 66 (!) 57  Resp: 18 18 18 18   Temp: 99.3 F (37.4 C) 98.5 F (36.9 C) 97.9 F (36.6 C) 98.2 F (36.8 C)  TempSrc:   Oral Oral  SpO2: 98% 98% 96% 99%  Weight:      Height:        General: Alert awake, not in obvious distress.  Scalp hematoma status post staples. HENT: pupils equally reacting to light,  No scleral pallor or icterus noted. Oral mucosa is moist.  Chest:  Clear breath sounds.  Diminished breath sounds bilaterally. No crackles or wheezes.  CVS: S1 &S2 heard. No murmur.  Regular rate and rhythm. Abdomen: Soft, nontender, nondistended.  Bowel sounds are heard.   Extremities: No cyanosis, clubbing or edema.  Peripheral pulses are palpable.  Left clavicle area tenderness no gross swelling. Psych: Alert, awake and oriented, normal mood CNS:  No cranial nerve deficits.  Power equal in all extremities.   Skin: Warm and dry.  No rashes noted.  The results of significant diagnostics from this hospitalization (including imaging, microbiology, ancillary and laboratory) are listed below for reference.     Diagnostic Studies:   DG Pelvis 1-2 Views  Result Date: 08/19/2020 CLINICAL DATA:  Fall. EXAM: PELVIS - 1-2 VIEW COMPARISON:  None. FINDINGS: There is no evidence of pelvic fracture or diastasis. No pelvic bone lesions are seen. IMPRESSION: Negative. Electronically Signed   By: Marijo Conception M.D.   On: 08/19/2020 21:36   DG Clavicle Left  Result Date: 08/20/2020 CLINICAL DATA:  Fall, concern for clavicle fracture due to severe pain. EXAM: LEFT CLAVICLE - 2+ VIEWS COMPARISON:  Shoulder evaluation from Aug 19, 2020. FINDINGS: Acromioclavicular degenerative changes are redemonstrated as on previous imaging. No sign of fracture of the clavicle. Mild asymmetric  density noted at the LEFT lung apex, question nodular density in this area. Degenerative changes are noted incidentally in the cervical spine. IMPRESSION: 1. No sign of clavicle fracture. 2. Asymmetric density at the LEFT lung apex, question nodular density in this area. Follow-up CT of the chest may be helpful for further evaluation. 3. Acromioclavicular degenerative changes. Electronically Signed   By: Zetta Bills M.D.   On: 08/20/2020 14:14   CT HEAD WO CONTRAST  Result Date: 08/20/2020 CLINICAL DATA:  Follow-up intracranial hemorrhage EXAM: CT HEAD WITHOUT CONTRAST TECHNIQUE: Contiguous axial images were obtained from the base of the skull through the vertex without intravenous contrast. COMPARISON:  CT head 08/19/2020 FINDINGS: Brain: Small anterior interhemispheric subdural hematoma has improved. Small amount of anterior interhemispheric subarachnoid hemorrhage also improved. No new area of hemorrhage There is moderate to advanced atrophy with ventricular enlargement, stable from the prior study. No intraventricular hemorrhage. No acute infarct or mass lesion identified. Vascular: Negative for hyperdense vessel Skull: Negative Sinuses/Orbits: Mild mucosal edema paranasal sinuses.  Bilateral cataract extraction. Other: None IMPRESSION: Improving anterior interhemispheric subdural and subarachnoid hemorrhage. No new area of hemorrhage. Moderate to extensive atrophy with ventricular enlargement, stable from the prior study. Electronically Signed   By: Franchot Gallo M.D.   On: 08/20/2020 10:03   CT Head Wo Contrast  Result Date: 08/19/2020 CLINICAL DATA:  Fall while on anti coagulation. EXAM: CT HEAD WITHOUT CONTRAST CT CERVICAL SPINE WITHOUT CONTRAST TECHNIQUE: Multidetector CT imaging of the head and cervical spine was performed following the standard protocol without intravenous contrast. Multiplanar CT image reconstructions of the cervical spine were also generated. COMPARISON:  None. FINDINGS: CT  HEAD FINDINGS Brain: Thin anterior parafalcine subdural hematoma measuring 5 mm. No mass effect or midline shift. Diffuse atrophy with ex vacuo dilatation of the ventricles. There is hypoattenuation of the periventricular white matter, most commonly indicating chronic ischemic microangiopathy. Vascular: No abnormal hyperdensity of the major intracranial arteries or dural venous sinuses. No intracranial atherosclerosis. Skull: Right occipital scalp hematoma.  No skull fracture. Sinuses/Orbits: No fluid levels or advanced mucosal thickening of the visualized paranasal sinuses. No mastoid or middle ear effusion. The orbits are normal. CT CERVICAL SPINE FINDINGS Alignment: No static subluxation. Facets are aligned. Occipital condyles are normally positioned. Skull base and vertebrae: No acute fracture. Soft tissues and spinal canal: No prevertebral fluid or swelling. No visible canal hematoma. Disc levels: No advanced spinal canal or neural foraminal stenosis. Upper chest: No pneumothorax, pulmonary nodule or pleural effusion. Other: Normal visualized paraspinal cervical soft tissues. IMPRESSION: 1. Thin anterior parafalcine subdural hematoma measuring 5 mm without mass effect or midline shift. 2. Right occipital scalp hematoma without skull fracture. 3. No acute fracture or static subluxation of the cervical spine. Critical Value/emergent results were called by telephone at the time of interpretation on 08/19/2020 at 9:32 pm to provider Aris Lot , who verbally acknowledged these results. Electronically Signed   By: Ulyses Jarred M.D.   On: 08/19/2020 21:34   CT Cervical Spine Wo Contrast  Result Date: 08/19/2020 CLINICAL DATA:  Fall while on anti coagulation. EXAM: CT HEAD WITHOUT CONTRAST CT CERVICAL SPINE WITHOUT CONTRAST TECHNIQUE: Multidetector CT imaging of the head and cervical spine was performed following the standard protocol without intravenous contrast. Multiplanar CT image reconstructions of the  cervical spine were also generated. COMPARISON:  None. FINDINGS: CT HEAD FINDINGS Brain: Thin anterior parafalcine subdural hematoma measuring 5 mm. No mass effect or midline shift. Diffuse atrophy with ex vacuo dilatation of the ventricles. There is hypoattenuation of the periventricular white matter, most commonly indicating chronic ischemic microangiopathy. Vascular: No abnormal hyperdensity of the major intracranial arteries or dural venous sinuses. No intracranial atherosclerosis. Skull: Right occipital scalp hematoma.  No skull fracture. Sinuses/Orbits: No fluid levels or advanced mucosal thickening of the visualized paranasal sinuses. No mastoid or middle ear effusion. The orbits are normal. CT CERVICAL SPINE FINDINGS Alignment: No static subluxation. Facets are aligned. Occipital condyles are normally positioned. Skull base and vertebrae: No acute fracture. Soft tissues and spinal canal: No prevertebral fluid or swelling. No visible canal hematoma. Disc levels: No advanced spinal canal or neural foraminal stenosis. Upper chest: No pneumothorax, pulmonary nodule or pleural effusion. Other: Normal visualized paraspinal cervical soft tissues. IMPRESSION: 1. Thin anterior parafalcine subdural hematoma measuring 5 mm without mass effect or midline shift. 2. Right occipital scalp hematoma without skull fracture. 3. No acute fracture or static subluxation of the cervical spine. Critical Value/emergent results were called by telephone at the time of interpretation on 08/19/2020  at 9:32 pm to provider Aris Lot , who verbally acknowledged these results. Electronically Signed   By: Ulyses Jarred M.D.   On: 08/19/2020 21:34   DG Chest Portable 1 View  Result Date: 08/19/2020 CLINICAL DATA:  Fall. EXAM: PORTABLE CHEST 1 VIEW COMPARISON:  September 06, 2019. FINDINGS: The heart size and mediastinal contours are within normal limits. Both lungs are clear. The visualized skeletal structures are unremarkable.  IMPRESSION: No active disease. Electronically Signed   By: Marijo Conception M.D.   On: 08/19/2020 21:33   DG Shoulder Left Portable  Result Date: 08/19/2020 CLINICAL DATA:  Left shoulder pain after fall. EXAM: LEFT SHOULDER COMPARISON:  None. FINDINGS: There is no evidence of fracture or dislocation. Mild degenerative changes seen involving the left acromioclavicular joint. Soft tissues are unremarkable. 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 No acute abnormality seen. Electronically Signed   By: Marijo Conception M.D.   On: 08/19/2020 21:35     Labs:   Basic Metabolic Panel: Recent Labs  Lab 08/19/20 2113 08/20/20 0057 08/21/20 0128  NA 137 137 138  K 3.8 3.6 3.6  CL 102 101 102  CO2 25 21* 25  GLUCOSE 119* 121* 110*  BUN 59* 59* 65*  CREATININE 4.01* 3.99* 3.98*  CALCIUM 8.9 9.0 8.5*  MG  --   --  2.6*  PHOS  --   --  5.1*   GFR Estimated Creatinine Clearance: 10.7 mL/min (A) (by C-G formula based on SCr of 3.98 mg/dL (H)). Liver Function Tests: Recent Labs  Lab 08/19/20 2113  AST 18  ALT 24  ALKPHOS 58  BILITOT 0.7  PROT 6.5  ALBUMIN 4.0   No results for input(s): LIPASE, AMYLASE in the last 168 hours. No results for input(s): AMMONIA in the last 168 hours. Coagulation profile Recent Labs  Lab 08/19/20 2113  INR 1.2    CBC: Recent Labs  Lab 08/19/20 2113 08/20/20 0057 08/21/20 0128  WBC 7.7 15.2* 8.5  NEUTROABS 4.5  --   --   HGB 9.7* 9.8* 8.3*  HCT 29.7* 29.1* 25.1*  MCV 99.3 98.0 98.8  PLT 202 198 169   Cardiac Enzymes: No results for input(s): CKTOTAL, CKMB, CKMBINDEX, TROPONINI in the last 168 hours. BNP: Invalid input(s): POCBNP CBG: No results for input(s): GLUCAP in the last 168 hours. D-Dimer No results for input(s): DDIMER in the last 72 hours. Hgb A1c No results for input(s): HGBA1C in the last 72 hours. Lipid Profile No results for input(s): CHOL, HDL, LDLCALC, TRIG, CHOLHDL, LDLDIRECT in the last 72  hours. Thyroid function studies No results for input(s): TSH, T4TOTAL, T3FREE, THYROIDAB in the last 72 hours.  Invalid input(s): FREET3 Anemia work up No results for input(s): VITAMINB12, FOLATE, FERRITIN, TIBC, IRON, RETICCTPCT in the last 72 hours. Microbiology Recent Results (from the past 240 hour(s))  SARS CORONAVIRUS 2 (TAT 6-24 HRS) Nasopharyngeal Nasopharyngeal Swab     Status: None   Collection Time: 08/19/20 10:06 PM   Specimen: Nasopharyngeal Swab  Result Value Ref Range Status   SARS Coronavirus 2 NEGATIVE NEGATIVE Final    Comment: (NOTE) SARS-CoV-2 target nucleic acids are NOT DETECTED.  The SARS-CoV-2 RNA is generally detectable in upper and lower respiratory specimens during the acute phase of infection. Negative results do not preclude SARS-CoV-2 infection, do not rule out co-infections with other pathogens, and should not be used as the sole basis for treatment or other patient management decisions. Negative results must be combined  with clinical observations, patient history, and epidemiological information. The expected result is Negative.  Fact Sheet for Patients: SugarRoll.be  Fact Sheet for Healthcare Providers: https://www.woods-mathews.com/  This test is not yet approved or cleared by the [place] FDA and  has been authorized for detection and/or diagnosis of SARS-CoV-2 by FDA under an Emergency Use Authorization (EUA). This EUA will remain  in effect (meaning this test can be used) for the duration of the COVID-19 declaration under Se ction 564(b)(1) of the Act, 21 U.S.C. section 360bbb-3(b)(1), unless the authorization is terminated or revoked sooner.  Performed at [facility], [place] [address], [place], [place] [address]      Discharge Instructions:   Discharge Instructions    Ambulatory referral to Physical Therapy   Complete by: As directed    Diet - low sodium heart healthy   Complete  by: As directed    Discharge instructions   Complete by: As directed    Follow-up with Dr. [name] cardiology in 1 week to discuss about blood thinners.  Do not take blood thinners until then. seek medical attention for worsening symptoms including headache, vomiting, altered consciousness.   Discharge wound care:   Complete by: As directed    Please remove the staples on the head on 08/28/2020.   Face-to-face encounter (required for Medicare/Medicaid patients)   Complete by: As directed    I Yama Nielson certify that this patient is under my care and that I, or a nurse practitioner or physician's assistant working with me, had a face-to-face encounter that meets the physician face-to-face encounter requirements with this patient on 08/21/2020. The encounter with the patient was in whole, or in part for the following medical condition(s) which is the primary reason for home health care debility, deconditioning and falls, intracranial hemorrhage.   The encounter with the patient was in whole, or in part, for the following medical condition, which is the primary reason for home health care: deconditing, debility, intracranial hemorrhage, falls   I certify that, based on my findings, the following services are medically necessary home health services: Physical therapy   Reason for Medically Necessary Home Health Services: Therapy- Therapeutic Exercises to Increase Strength and Endurance   My clinical findings support the need for the above services: Unable to leave home safely without assistance and/or assistive device   Further, I certify that my clinical findings support that this patient is homebound due to: Unable to leave home safely without assistance   Home Health   Complete by: As directed    To provide the following care/treatments: PT   Increase activity slowly   Complete by: As directed      Allergies as of 08/21/2020   No Known Allergies     Medication List    STOP taking these  medications   Eliquis 2.5 MG Tabs tablet Generic drug: apixaban     TAKE these medications   acetaminophen 500 MG tablet Commonly known as: TYLENOL Take 1 tablet (500 mg total) by mouth every 6 (six) hours as needed.   atorvastatin 20 MG tablet Commonly known as: LIPITOR Take 1 tablet by mouth daily.   brimonidine-timolol 0.2-0.5 % ophthalmic solution Commonly known as: COMBIGAN Place 1 drop into the left eye 2 (two) times daily.   febuxostat 40 MG tablet Commonly known as: ULORIC Take 40 mg by mouth daily.   finasteride 5 MG tablet Commonly known as: PROSCAR Take 5 mg by mouth daily.   furosemide 40 MG tablet  Commonly known as: LASIX Take 40 mg by mouth 3 (three) times daily.   gabapentin 100 MG capsule Commonly known as: NEURONTIN Take 100 mg by mouth 3 (three) times daily.   hydrALAZINE 100 MG tablet Commonly known as: APRESOLINE Take 100 mg by mouth 2 (two) times daily.   lidocaine 5 % Commonly known as: LIDODERM Place 1 patch onto the skin daily. Remove & Discard patch within 12 hours or as directed by MD   Netarsudil-Latanoprost 0.02-0.005 % Soln Place 1 drop into the left eye daily.   Pacerone 200 MG tablet Generic drug: amiodarone Take 200 mg by mouth 2 (two) times daily.   polyethylene glycol 17 g packet Commonly known as: MIRALAX / GLYCOLAX Take 17 g by mouth daily.   PreserVision AREDS 2 Caps Take 1 capsule by mouth daily.            Discharge Care Instructions  (From admission, onward)         Start     Ordered   08/21/20 0000  Discharge wound care:       Comments: Please remove the staples on the head on 08/28/2020.   08/21/20 1336          Follow-up Information    Javier Glazier, MD Follow up in 1 week(s).   Specialty: Internal Medicine Why: Regular follow-up and blood work Contact information: [address] [place] [address] [phone]        Fay Records, MD. Schedule an appointment as soon as possible for a  visit.   Specialty: Cardiology Why: To discuss about anticoagulation. Contact information: [place] [address] [phone]                Time coordinating discharge: 39 minutes  Signed:  Sarann Tregre  [facility] 08/21/2020, 2:26 PM

## 2020-08-21 NOTE — Care Management Obs Status (Signed)
[facility] NOTIFICATION   Patient Details  Name: Adam [name] MRN: [id number] Date of Birth: 05/24/1930   Medicare Observation Status Notification Given:  Yes    Pollie Friar, RN 08/21/2020, 10:12 AM

## 2020-08-21 NOTE — Plan of Care (Signed)

## 2020-08-21 NOTE — Plan of Care (Signed)
  Problem: Education: Goal: Knowledge of General Education information will improve Description: Including pain rating scale, medication(s)/side effects and non-pharmacologic comfort measures 08/21/2020 0816 by Drucie Ip I, RN Outcome: Progressing 08/21/2020 0812 by Drucie Ip I, RN Outcome: Progressing   Problem: Health Behavior/Discharge Planning: Goal: Ability to manage health-related needs will improve 08/21/2020 0816 by Drucie Ip I, RN Outcome: Progressing 08/21/2020 0812 by Drucie Ip I, RN Outcome: Progressing   Problem: Clinical Measurements: Goal: Ability to maintain clinical measurements within normal limits will improve 08/21/2020 0816 by Drucie Ip I, RN Outcome: Progressing 08/21/2020 0812 by Drucie Ip I, RN Outcome: Progressing Goal: Will remain free from infection 08/21/2020 0816 by Drucie Ip I, RN Outcome: Progressing 08/21/2020 0812 by Drucie Ip I, RN Outcome: Progressing Goal: Diagnostic test results will improve 08/21/2020 0816 by Drucie Ip I, RN Outcome: Progressing 08/21/2020 0812 by Drucie Ip I, RN Outcome: Progressing Goal: Respiratory complications will improve 08/21/2020 0816 by Drucie Ip I, RN Outcome: Progressing 08/21/2020 0812 by Drucie Ip I, RN Outcome: Progressing Goal: Cardiovascular complication will be avoided 08/21/2020 0816 by Drucie Ip I, RN Outcome: Progressing 08/21/2020 0812 by Drucie Ip I, RN Outcome: Progressing   Problem: Activity: Goal: Risk for activity intolerance will decrease 08/21/2020 0816 by Drucie Ip I, RN Outcome: Progressing 08/21/2020 0812 by Drucie Ip I, RN Outcome: Progressing   Problem: Nutrition: Goal: Adequate nutrition will be maintained 08/21/2020 0816 by Drucie Ip I, RN Outcome: Progressing 08/21/2020 0812 by Drucie Ip I, RN Outcome: Progressing   Problem: Coping: Goal: Level  of anxiety will decrease 08/21/2020 0816 by Drucie Ip I, RN Outcome: Progressing 08/21/2020 0812 by Drucie Ip I, RN Outcome: Progressing   Problem: Elimination: Goal: Will not experience complications related to bowel motility 08/21/2020 0816 by Drucie Ip I, RN Outcome: Progressing 08/21/2020 0812 by Drucie Ip I, RN Outcome: Progressing Goal: Will not experience complications related to urinary retention 08/21/2020 0816 by Drucie Ip I, RN Outcome: Progressing 08/21/2020 0812 by Drucie Ip I, RN Outcome: Progressing   Problem: Pain Managment: Goal: General experience of comfort will improve 08/21/2020 0816 by Drucie Ip I, RN Outcome: Progressing 08/21/2020 0812 by Drucie Ip I, RN Outcome: Progressing   Problem: Safety: Goal: Ability to remain free from injury will improve 08/21/2020 0816 by Drucie Ip I, RN Outcome: Progressing 08/21/2020 0812 by Drucie Ip I, RN Outcome: Progressing   Problem: Skin Integrity: Goal: Risk for impaired skin integrity will decrease 08/21/2020 0816 by Drucie Ip I, RN Outcome: Progressing 08/21/2020 0812 by Drucie Ip I, RN Outcome: Progressing

## 2020-08-21 NOTE — Evaluation (Signed)
Physical Therapy Evaluation Patient Details Name: Adam [name] MRN: [id number] DOB: Jun 13, 1930 Today's Date: 08/21/2020   History of Present Illness  87 yo admitted 5/20 after fall at [facility] with left clavicle pain negative for fx and SDH with scalp laceration. PMHx: CKD, anemia, AFib, CHF, HTN, gout  Clinical Impression  Pt pleasant and required increased time to don socks and transition to EOB. Pt able to walk long hall distance with assist of RW with cues for correct posture. Pt with partial knee buckling without use of RW and reports not using RW to walk onto patio where he fell with education for need of RW with all mobility. Pt with decreased gait and safety who will benefit from acute therapy to maximize function and independence with HHPT for further assessment of home management.     Follow Up Recommendations Home health PT    Equipment Recommendations  None recommended by PT    Recommendations for Other Services OT consult     Precautions / Restrictions Precautions Precautions: Fall Restrictions Weight Bearing Restrictions: No      Mobility  Bed Mobility Overal bed mobility: Modified Independent             General bed mobility comments: increased time with use of rail    Transfers Overall transfer level: Modified independent                  Ambulation/Gait Ambulation/Gait assistance: Supervision Gait Distance (Feet): 350 Feet Assistive device: Rolling walker (2 wheeled) Gait Pattern/deviations: Step-through pattern;Decreased stride length;Trunk flexed   Gait velocity interpretation: 1.31 - 2.62 ft/sec, indicative of limited community ambulator General Gait Details: cues for posture and proximity to RW. walked last 20' without RW with pt instinctively reaching for environmental support with partial knee buckle at end of gait and min assist to recover  Stairs            Wheelchair Mobility    Modified Rankin (Stroke Patients Only) Modified  Rankin (Stroke Patients Only) Pre-Morbid Rankin Score: Moderate disability Modified Rankin: Moderate disability     Balance Overall balance assessment: Needs assistance;History of Falls   Sitting balance-Leahy Scale: Good     Standing balance support: Bilateral upper extremity supported Standing balance-Leahy Scale: Poor Standing balance comment: reliant on UE support for gait                             Pertinent Vitals/Pain Pain Assessment: 0-10 Pain Score: 6  Pain Location: left shoulder Pain Descriptors / Indicators: Aching;Guarding Pain Intervention(s): Limited activity within patient's tolerance;Monitored during session;Repositioned    Home Living Family/patient expects to be discharged to:: Private residence Living Arrangements: Alone   Type of Home: Apartment Home Access: Level entry     Home Layout: One level Home Equipment: Environmental consultant - 2 wheels;Bedside commode;Shower seat Additional Comments: Pt lives in [place] which is [employer]    Prior Function Level of Independence: Independent with assistive device(s)         Comments: walks with Rw, doesn't drive, assist for housework facility provides meals     Hand Dominance        Extremity/Trunk Assessment   Upper Extremity Assessment Upper Extremity Assessment: Generalized weakness    Lower Extremity Assessment Lower Extremity Assessment: Generalized weakness    Cervical / Trunk Assessment Cervical / Trunk Assessment: Kyphotic  Communication   Communication: No difficulties  Cognition Arousal/Alertness: Awake/alert Behavior During Therapy: WFL for tasks assessed/performed  Overall Cognitive Status: Impaired/Different from baseline Area of Impairment: Safety/judgement                         Safety/Judgement: Decreased awareness of deficits     General Comments: pt typically ditches RW at home to go on patio and this is where he fell      General Comments       Exercises     Assessment/Plan    PT Assessment Patient needs continued PT services  PT Problem List Decreased mobility;Decreased activity tolerance;Decreased balance;Decreased knowledge of use of DME;Decreased cognition       PT Treatment Interventions Gait training;Balance training;Functional mobility training;Therapeutic activities;Patient/family education;Therapeutic exercise;DME instruction    PT Goals (Current goals can be found in the Care Plan section)  Acute Rehab PT Goals Patient Stated Goal: return to my apartment tomorrow PT Goal Formulation: With patient Time For Goal Achievement: 09/04/20 Potential to Achieve Goals: Good    Frequency Min 3X/week   Barriers to discharge Decreased caregiver support      Co-evaluation               AM-PAC PT "6 Clicks" Mobility  Outcome Measure Help needed turning from your back to your side while in a flat bed without using bedrails?: None Help needed moving from lying on your back to sitting on the side of a flat bed without using bedrails?: None Help needed moving to and from a bed to a chair (including a wheelchair)?: None Help needed standing up from a chair using your arms (e.g., wheelchair or bedside chair)?: A Little Help needed to walk in hospital room?: A Little Help needed climbing 3-5 steps with a railing? : A Lot 6 Click Score: 20    End of Session Equipment Utilized During Treatment: Gait belt Activity Tolerance: Patient tolerated treatment well Patient left: in chair;with call bell/phone within reach;with chair alarm set Nurse Communication: Mobility status PT Visit Diagnosis: Other abnormalities of gait and mobility (R26.89);Difficulty in walking, not elsewhere classified (R26.2);History of falling (Z91.81)    Time: 3016-0109 PT Time Calculation (min) (ACUTE ONLY): 28 min   Charges:   PT Evaluation $PT Eval Moderate Complexity: 1 Mod PT Treatments $Therapeutic Activity: 8-22 mins        Donley Harland  P, PT Acute Rehabilitation Services Pager: [phone] Office: [place] 08/21/2020, 10:47 AM

## 2020-08-21 NOTE — TOC Transition Note (Signed)
Transition of Care [facility]) - CM/SW Discharge Note   Patient Details  Name: Cobey Raineri MRN: [id number] Date of Birth: 14-May-1930  [facility]) CM/SW Contact:  Pollie Friar, RN Phone Number: 08/21/2020, 1:52 PM   Clinical Narrative:    Patient is from [place]. He is discharging back to his apartment today with therapy through [facility]). No DME needs.  Pt has a walker, elevated commode, shower seat at his apartment. He states his daughter sets up his pill box each week and checks on him 2-3 times a week.  Pt denies issues with transportation.  [name] to provide transport back to [place]. CM called and left the therapy department a message to arrange therapy. CM will f/u with them in the am.    Final next level of care: Home w Home Health Services Barriers to Discharge: No Barriers Identified   Patient Goals and CMS Choice   CMS Medicare.gov Compare Post Acute Care list provided to:: Patient Choice offered to / list presented to : Patient  Discharge Placement                       Discharge Plan and Services                          HH Arranged: PT       Representative spoke with at [place]: therapy through [facility]) Interventions     Readmission Risk Interventions No flowsheet data found.

## 2020-08-22 ENCOUNTER — Encounter (INDEPENDENT_AMBULATORY_CARE_PROVIDER_SITE_OTHER): Payer: Self-pay | Admitting: Ophthalmology

## 2020-09-06 ENCOUNTER — Other Ambulatory Visit: Payer: Self-pay

## 2020-09-06 ENCOUNTER — Ambulatory Visit (INDEPENDENT_AMBULATORY_CARE_PROVIDER_SITE_OTHER): Payer: Medicare Other | Admitting: Ophthalmology

## 2020-09-06 ENCOUNTER — Encounter (INDEPENDENT_AMBULATORY_CARE_PROVIDER_SITE_OTHER): Payer: Self-pay | Admitting: Ophthalmology

## 2020-09-06 DIAGNOSIS — H35371 Puckering of macula, right eye: Secondary | ICD-10-CM

## 2020-09-06 DIAGNOSIS — H353221 Exudative age-related macular degeneration, left eye, with active choroidal neovascularization: Secondary | ICD-10-CM | POA: Diagnosis not present

## 2020-09-06 DIAGNOSIS — H353113 Nonexudative age-related macular degeneration, right eye, advanced atrophic without subfoveal involvement: Secondary | ICD-10-CM

## 2020-09-06 MED ORDER — AFLIBERCEPT 2MG/0.05ML IZ SOLN FOR KALEIDOSCOPE
2.0000 mg | INTRAVITREAL | Status: AC | PRN
  Administered 2020-09-06: 2 mg via INTRAVITREAL

## 2020-09-06 NOTE — Progress Notes (Signed)
09/06/2020     CHIEF COMPLAINT Patient presents for Retina Follow Up (8 week fu OS and OCT/Eylea OS/Pt states, "Overall my va seems to be a little more blurred and gradually worsening." /Pt reports using Combigan Qday OS and Rocklatan QHS OS)   HISTORY OF PRESENT ILLNESS: Adam [name] is a 87 y.o. male who presents to the clinic today for:   HPI    Retina Follow Up    Diagnosis: Wet AMD   Laterality: left eye   Onset: 8 weeks ago   Severity: mild   Duration: 8 weeks   Course: gradually worsening   Comments: 8 week fu OS and OCT/Eylea OS Pt states, "Overall my va seems to be a little more blurred and gradually worsening."  Pt reports using Combigan Qday OS and Rocklatan QHS OS       Last edited by Kendra Opitz, COA on 09/06/2020  1:53 PM. (History)      Referring physician: Javier Glazier, MD [place],  [place] [address]  HISTORICAL INFORMATION:   Selected notes from the MEDICAL RECORD NUMBER    Lab Results  Component Value Date   HGBA1C 5.7 (H) 08/03/2019     CURRENT MEDICATIONS: Current Outpatient Medications (Ophthalmic Drugs)  Medication Sig  . brimonidine-timolol (COMBIGAN) 0.2-0.5 % ophthalmic solution Place 1 drop into the left eye 2 (two) times daily.   . brimonidine-timolol (COMBIGAN) 0.2-0.5 % ophthalmic solution Place 1 drop into the left eye 2 (two) times daily.  . Netarsudil-Latanoprost 0.02-0.005 % SOLN Place 1 drop into the left eye at bedtime.   . Netarsudil-Latanoprost 0.02-0.005 % SOLN Place 1 drop into the left eye daily.   No current facility-administered medications for this visit. (Ophthalmic Drugs)   Current Outpatient Medications (Other)  Medication Sig  . acetaminophen (TYLENOL) 500 MG tablet Take 500 mg by mouth daily as needed for headache (pain).  Marland Kitchen acetaminophen (TYLENOL) 500 MG tablet Take 1 tablet (500 mg total) by mouth every 6 (six) hours as needed.  Marland Kitchen amiodarone (PACERONE) 100 MG tablet Take 100 mg by mouth daily.  Marland Kitchen  apixaban (ELIQUIS) 2.5 MG TABS tablet Take 1 tablet (2.5 mg total) by mouth 2 (two) times daily.  . Ascorbic Acid (VITAMIN C) 500 MG CAPS Take 500 mg by mouth 2 (two) times daily.  Marland Kitchen atorvastatin (LIPITOR) 20 MG tablet Take 1 tablet (20 mg total) by mouth daily.  Marland Kitchen atorvastatin (LIPITOR) 20 MG tablet Take 1 tablet by mouth daily.  . betamethasone dipropionate (DIPROLENE) 0.05 % ointment Apply 1 application topically daily as needed (itching/rash).   Marland Kitchen Bioflavonoid Products (ESTER C PO) Take 1 tablet by mouth 2 (two) times daily.  . clobetasol cream (TEMOVATE) 6.38 % Apply 1 application topically daily as needed (itching/rash).   . febuxostat (ULORIC) 40 MG tablet Take 40 mg by mouth See admin instructions. Take one tablet (40 mg) by mouth Tuesday, Friday, Sunday evening  . febuxostat (ULORIC) 40 MG tablet Take 40 mg by mouth daily.  . ferrous sulfate 324 MG TBEC Take 324 mg by mouth daily with breakfast.  . finasteride (PROSCAR) 5 MG tablet Take 5 mg by mouth daily.   . finasteride (PROSCAR) 5 MG tablet Take 5 mg by mouth daily.  . furosemide (LASIX) 40 MG tablet Take 40 mg by mouth. 80mg  in the AM; 40mg  in the PM  . furosemide (LASIX) 40 MG tablet Take 40 mg by mouth 3 (three) times daily.  Marland Kitchen gabapentin (NEURONTIN)  100 MG capsule Take 100 mg by mouth 3 (three) times daily.   Marland Kitchen gabapentin (NEURONTIN) 100 MG capsule Take 100 mg by mouth 3 (three) times daily.  . hydrALAZINE (APRESOLINE) 100 MG tablet Take 1 tablet (100 mg total) by mouth every 8 (eight) hours.  . hydrALAZINE (APRESOLINE) 100 MG tablet Take 100 mg by mouth 2 (two) times daily.  Marland Kitchen lidocaine (LIDODERM) 5 % Place 1 patch onto the skin daily. Remove & Discard patch within 12 hours or as directed by MD  . methylPREDNISolone (MEDROL DOSEPAK) 4 MG TBPK tablet Use as directed  . Multiple Vitamins-Minerals (PRESERVISION AREDS 2) CAPS Take 1 capsule by mouth daily.  . Multiple Vitamins-Minerals (PRESERVISION AREDS PO) Take 1 capsule by  mouth 2 (two) times daily.   Marland Kitchen omeprazole (PRILOSEC OTC) 20 MG tablet Take 20 mg by mouth daily with lunch.   Marland Kitchen PACERONE 200 MG tablet Take 200 mg by mouth 2 (two) times daily.  . polyethylene glycol (MIRALAX / GLYCOLAX) 17 g packet Take 17 g by mouth daily.  . traMADol (ULTRAM) 50 MG tablet Take 1 tablet (50 mg total) by mouth every 6 (six) hours as needed for up to 12 doses.   No current facility-administered medications for this visit. (Other)      REVIEW OF SYSTEMS:    ALLERGIES No Known Allergies  PAST MEDICAL HISTORY Past Medical History:  Diagnosis Date  . Acute meniscal tear of knee RIGHT KNEE  . Anemia   . Arthritis   . BPH (benign prostatic hypertrophy)   . CHF (congestive heart failure) ([place])   . Chronic kidney disease   . Colon polyps    hyperplastic  . Diabetes mellitus type 2, diet-controlled (HCC) AND EXERCISE-- LAST A1C  5.6  . Diverticulosis   . Gait abnormality 11/08/2016  . GERD (gastroesophageal reflux disease)   . Glaucoma   . H/O hiatal hernia   . Heart murmur   . History of esophageal stricture S/P DILATION IN 2007  . Hyperlipidemia   . Hypertension   . IgM monoclonal gammopathy of uncertain significance ASYMPTOMATIC    FOLLOWED BY DR Alen Blew  . Internal hemorrhoids   . Macrocytic anemia DUE TO CHRONIC RENAL INSUFF.  . Nocturia   . Persistent atrial fibrillation ([place])   . Right knee pain    Past Surgical History:  Procedure Laterality Date  . CATARACT EXTRACTION W/ INTRAOCULAR LENS  IMPLANT, BILATERAL  2012  . HEMORRHOID SURGERY    . HERNIA REPAIR    . KNEE ARTHROSCOPY  12/25/2011   Procedure: ARTHROSCOPY KNEE;  Surgeon: Tobi Bastos, MD;  Location: [facility];  Service: Orthopedics;  Laterality: Right;  right knee arthroscopy with medial menisectomy      FAMILY HISTORY Family History  Problem Relation Age of Onset  . CAD Father   . Diabetes Brother   . Heart attack Father   . Early death Father   . Hearing loss  Father   . Heart disease Father   . Alcohol abuse Sister   . Cancer Sister     SOCIAL HISTORY Social History   Tobacco Use  . Smoking status: Former Smoker    Packs/day: 1.00    Years: 10.00    Pack years: 10.00    Types: Cigarettes  . Smokeless tobacco: Never Used  Substance Use Topics  . Alcohol use: Yes  . Drug use: Never         OPHTHALMIC EXAM: Base Eye Exam  Visual Acuity (ETDRS)      Right Left   Dist [place] 20/200 20/40 -1   Dist ph [place] NI 20/30       Tonometry (Tonopen, 1:59 PM)      Right Left   Pressure 12 11       Pupils      Pupils Dark Light Shape React APD   Right PERRL 4 3 Round Brisk None   Left PERRL 3 2 Round Brisk +1       Visual Fields (Counting fingers)      Left Right     Full   Restrictions Partial outer inferior nasal deficiency        Extraocular Movement      Right Left    Full Full       Neuro/Psych    Oriented x3: Yes   Mood/Affect: Normal       Dilation    Left eye: 1.0% Mydriacyl, 2.5% Phenylephrine @ 1:59 PM        Slit Lamp and Fundus Exam    External Exam      Right Left   External Normal Normal       Slit Lamp Exam      Right Left   Lids/Lashes Normal Normal   Conjunctiva/Sclera White and quiet White and quiet   Cornea Clear Clear   Anterior Chamber Deep and quiet Deep and quiet   Iris Round and reactive Round and reactive   Lens Posterior chamber intraocular lens Posterior chamber intraocular lens   Anterior Vitreous Normal Normal       Fundus Exam      Right Left   Posterior Vitreous  Posterior vitreous detachment   Disc  Pallor 1+   C/D Ratio  0.5   Macula  Atrophy, Age related macular degeneration, Early age related macular degeneration, Macular thickening improved, Disciform scar, Geographic atrophy, Epiretinal membrane   Vessels  Normal   Periphery  Normal          IMAGING AND PROCEDURES  Imaging and Procedures for 09/06/20  OCT, Retina - OU - Both Eyes       Right Eye Quality  was good. Scan locations included subfoveal. Central Foveal Thickness: 319. Progression has been stable. Findings include abnormal foveal contour, epiretinal membrane, vitreous traction, cystoid macular edema, outer retinal atrophy, subretinal hyper-reflective material.   Left Eye Quality was good. Scan locations included subfoveal. Central Foveal Thickness: 284. Progression has improved. Findings include no IRF, no SRF, retinal drusen , outer retinal atrophy.   Notes OD, overall stable and much less perifoveal CME on intravitreal Avastin today at an interval of 7 weeks.  We will maintain same interval right eye at  weeks follow-up  OS, improved 7 weeks and 6 days after recent Eylea follow-up and scheduled        Intravitreal Injection, Pharmacologic Agent - OS - Left Eye       Time Out 09/06/2020. 2:53 PM. Confirmed correct patient, procedure, site, and patient consented.   Anesthesia Topical anesthesia was used.   Procedure Preparation included 10% betadine to eyelids, 5% betadine to ocular surface, Ofloxacin . A 30 gauge needle was used.   Injection:  2 mg aflibercept Alfonse Flavors) SOLN   NDC: [id number], Lot: [id number]   Route: Intravitreal, Site: Left Eye, Waste: 0 mg  Post-op Post injection exam found visual acuity of at least counting fingers. The patient tolerated the procedure well. There were no complications. The patient received written and verbal  post procedure care education. Post injection medications were not given.                 ASSESSMENT/PLAN:  Exudative age-related macular degeneration of left eye with active choroidal neovascularization (HCC) Improved macular anatomy much less intraretinal and subretinal fluid left eye at 7-week interval nearly 8 weeks.  We will repeat injection today and follow-up next in 9 weeks left eye  Macular pucker, right eye Macular pucker with a foveal macular traction, not because of vision loss  Advanced nonexudative  age-related macular degeneration of right eye without subfoveal involvement Subfoveal atrophy accounts for some of the vision change in the right eye      ICD-10-CM   1. Exudative age-related macular degeneration of left eye with active choroidal neovascularization (HCC)  H35.3221 OCT, Retina - OU - Both Eyes    Intravitreal Injection, Pharmacologic Agent - OS - Left Eye    aflibercept (EYLEA) SOLN 2 mg  2. Macular pucker, right eye  H35.371   3. Advanced nonexudative age-related macular degeneration of right eye without subfoveal involvement  H35.3113     1.  OS, subfoveal CNVM, improved and stabilized on intravitreal Eylea currently at 8-week follow-up interval.  History of multiple recurrences.  We will extend interval examination next left eye to 10 weeks  2.  OD next as scheduled next week  3.  Ophthalmic Meds Ordered this visit:  Meds ordered this encounter  Medications  . aflibercept (EYLEA) SOLN 2 mg       Return in about 9 weeks (around 11/08/2020) for dilate, OS, EYLEA OCT,, and follow-up OD as scheduled.  There are no Patient Instructions on file for this visit.   Explained the diagnoses, plan, and follow up with the patient and they expressed understanding.  Patient expressed understanding of the importance of proper follow up care.   Clent Demark Peggy Loge M.D. Diseases & Surgery of the Retina and Vitreous Retina & Diabetic [place] 09/06/20     Abbreviations: M myopia (nearsighted); A astigmatism; H hyperopia (farsighted); P presbyopia; Mrx spectacle prescription;  CTL contact lenses; OD right eye; OS left eye; OU both eyes  XT exotropia; ET esotropia; PEK punctate epithelial keratitis; PEE punctate epithelial erosions; DES dry eye syndrome; MGD meibomian gland dysfunction; ATs artificial tears; PFAT's preservative free artificial tears; [place] nuclear sclerotic cataract; PSC posterior subcapsular cataract; ERM epi-retinal membrane; PVD posterior vitreous detachment; RD  retinal detachment; DM diabetes mellitus; DR diabetic retinopathy; NPDR non-proliferative diabetic retinopathy; PDR proliferative diabetic retinopathy; CSME clinically significant macular edema; DME diabetic macular edema; dbh dot blot hemorrhages; CWS cotton wool spot; POAG primary open angle glaucoma; C/D cup-to-disc ratio; HVF humphrey visual field; GVF goldmann visual field; OCT optical coherence tomography; IOP intraocular pressure; BRVO Branch retinal vein occlusion; CRVO central retinal vein occlusion; CRAO central retinal artery occlusion; BRAO branch retinal artery occlusion; RT retinal tear; SB scleral buckle; PPV pars plana vitrectomy; VH Vitreous hemorrhage; PRP panretinal laser photocoagulation; IVK intravitreal kenalog; VMT vitreomacular traction; MH Macular hole;  NVD neovascularization of the disc; NVE neovascularization elsewhere; AREDS age related eye disease study; ARMD age related macular degeneration; POAG primary open angle glaucoma; EBMD epithelial/anterior basement membrane dystrophy; ACIOL anterior chamber intraocular lens; IOL intraocular lens; PCIOL posterior chamber intraocular lens; Phaco/IOL phacoemulsification with intraocular lens placement; [place] photorefractive keratectomy; LASIK laser assisted in situ keratomileusis; HTN hypertension; DM diabetes mellitus; COPD chronic obstructive pulmonary disease

## 2020-09-06 NOTE — Assessment & Plan Note (Signed)
Subfoveal atrophy accounts for some of the vision change in the right eye

## 2020-09-06 NOTE — Assessment & Plan Note (Signed)
Macular pucker with a foveal macular traction, not because of vision loss

## 2020-09-06 NOTE — Assessment & Plan Note (Signed)
Improved macular anatomy much less intraretinal and subretinal fluid left eye at 7-week interval nearly 8 weeks.  We will repeat injection today and follow-up next in 9 weeks left eye

## 2020-09-08 ENCOUNTER — Encounter (INDEPENDENT_AMBULATORY_CARE_PROVIDER_SITE_OTHER): Payer: Medicare Other | Admitting: Ophthalmology

## 2020-09-13 ENCOUNTER — Ambulatory Visit (INDEPENDENT_AMBULATORY_CARE_PROVIDER_SITE_OTHER): Payer: Medicare Other | Admitting: Ophthalmology

## 2020-09-13 ENCOUNTER — Encounter (INDEPENDENT_AMBULATORY_CARE_PROVIDER_SITE_OTHER): Payer: Self-pay | Admitting: Ophthalmology

## 2020-09-13 ENCOUNTER — Other Ambulatory Visit: Payer: Self-pay

## 2020-09-13 DIAGNOSIS — H353211 Exudative age-related macular degeneration, right eye, with active choroidal neovascularization: Secondary | ICD-10-CM

## 2020-09-13 DIAGNOSIS — H353123 Nonexudative age-related macular degeneration, left eye, advanced atrophic without subfoveal involvement: Secondary | ICD-10-CM

## 2020-09-13 DIAGNOSIS — H35371 Puckering of macula, right eye: Secondary | ICD-10-CM | POA: Diagnosis not present

## 2020-09-13 DIAGNOSIS — H353221 Exudative age-related macular degeneration, left eye, with active choroidal neovascularization: Secondary | ICD-10-CM

## 2020-09-13 MED ORDER — BEVACIZUMAB 2.5 MG/0.1ML IZ SOSY
2.5000 mg | PREFILLED_SYRINGE | INTRAVITREAL | Status: AC | PRN
  Administered 2020-09-13: 2.5 mg via INTRAVITREAL

## 2020-09-13 NOTE — Progress Notes (Addendum)
09/13/2020     CHIEF COMPLAINT Patient presents for Retina Follow Up (8 Wk F/U OD, poss Avastin OD//Pt denies noticeable changes to [place] OU since last visit. Pt denies ocular pain, flashes of light, or floaters OU. //)   HISTORY OF PRESENT ILLNESS: Adam [name] is a 87 y.o. male who presents to the clinic today for:   HPI     Retina Follow Up           Diagnosis: Wet AMD   Laterality: right eye   Onset: 8 weeks ago   Severity: moderate   Duration: 8 weeks   Course: stable   Comments: 8 Wk F/U OD, poss Avastin OD  Pt denies noticeable changes to [place] OU since last visit. Pt denies ocular pain, flashes of light, or floaters OU.            Comments   No Change in acuity OD at 8-week follow-up today      Last edited by Hurman Horn, MD on 09/13/2020  2:36 PM.      Referring physician: Javier Glazier, MD [place],  [place] [address]  HISTORICAL INFORMATION:   Selected notes from the MEDICAL RECORD NUMBER    Lab Results  Component Value Date   HGBA1C 5.7 (H) 08/03/2019     CURRENT MEDICATIONS: Current Outpatient Medications (Ophthalmic Drugs)  Medication Sig   brimonidine-timolol (COMBIGAN) 0.2-0.5 % ophthalmic solution Place 1 drop into the left eye 2 (two) times daily.    brimonidine-timolol (COMBIGAN) 0.2-0.5 % ophthalmic solution Place 1 drop into the left eye 2 (two) times daily.   Netarsudil-Latanoprost 0.02-0.005 % SOLN Place 1 drop into the left eye at bedtime.    Netarsudil-Latanoprost 0.02-0.005 % SOLN Place 1 drop into the left eye daily.   No current facility-administered medications for this visit. (Ophthalmic Drugs)   Current Outpatient Medications (Other)  Medication Sig   acetaminophen (TYLENOL) 500 MG tablet Take 500 mg by mouth daily as needed for headache (pain).   acetaminophen (TYLENOL) 500 MG tablet Take 1 tablet (500 mg total) by mouth every 6 (six) hours as needed.   amiodarone (PACERONE) 100 MG tablet Take 100 mg by mouth  daily.   apixaban (ELIQUIS) 2.5 MG TABS tablet Take 1 tablet (2.5 mg total) by mouth 2 (two) times daily.   Ascorbic Acid (VITAMIN C) 500 MG CAPS Take 500 mg by mouth 2 (two) times daily.   atorvastatin (LIPITOR) 20 MG tablet Take 1 tablet (20 mg total) by mouth daily.   atorvastatin (LIPITOR) 20 MG tablet Take 1 tablet by mouth daily.   betamethasone dipropionate (DIPROLENE) 0.05 % ointment Apply 1 application topically daily as needed (itching/rash).    Bioflavonoid Products (ESTER C PO) Take 1 tablet by mouth 2 (two) times daily.   clobetasol cream (TEMOVATE) 5.42 % Apply 1 application topically daily as needed (itching/rash).    febuxostat (ULORIC) 40 MG tablet Take 40 mg by mouth See admin instructions. Take one tablet (40 mg) by mouth Tuesday, Friday, Sunday evening   febuxostat (ULORIC) 40 MG tablet Take 40 mg by mouth daily.   ferrous sulfate 324 MG TBEC Take 324 mg by mouth daily with breakfast.   finasteride (PROSCAR) 5 MG tablet Take 5 mg by mouth daily.    finasteride (PROSCAR) 5 MG tablet Take 5 mg by mouth daily.   furosemide (LASIX) 40 MG tablet Take 40 mg by mouth. 80mg  in the AM; 40mg  in the PM  furosemide (LASIX) 40 MG tablet Take 40 mg by mouth 3 (three) times daily.   gabapentin (NEURONTIN) 100 MG capsule Take 100 mg by mouth 3 (three) times daily.    gabapentin (NEURONTIN) 100 MG capsule Take 100 mg by mouth 3 (three) times daily.   hydrALAZINE (APRESOLINE) 100 MG tablet Take 1 tablet (100 mg total) by mouth every 8 (eight) hours.   hydrALAZINE (APRESOLINE) 100 MG tablet Take 100 mg by mouth 2 (two) times daily.   lidocaine (LIDODERM) 5 % Place 1 patch onto the skin daily. Remove & Discard patch within 12 hours or as directed by MD   methylPREDNISolone (MEDROL DOSEPAK) 4 MG TBPK tablet Use as directed   Multiple Vitamins-Minerals (PRESERVISION AREDS 2) CAPS Take 1 capsule by mouth daily.   Multiple Vitamins-Minerals (PRESERVISION AREDS PO) Take 1 capsule by mouth 2 (two)  times daily.    omeprazole (PRILOSEC OTC) 20 MG tablet Take 20 mg by mouth daily with lunch.    PACERONE 200 MG tablet Take 200 mg by mouth 2 (two) times daily.   polyethylene glycol (MIRALAX / GLYCOLAX) 17 g packet Take 17 g by mouth daily.   traMADol (ULTRAM) 50 MG tablet Take 1 tablet (50 mg total) by mouth every 6 (six) hours as needed for up to 12 doses.   No current facility-administered medications for this visit. (Other)      REVIEW OF SYSTEMS:    ALLERGIES No Known Allergies  PAST MEDICAL HISTORY Past Medical History:  Diagnosis Date   Acute meniscal tear of knee RIGHT KNEE   Anemia    Arthritis    BPH (benign prostatic hypertrophy)    CHF (congestive heart failure) (HCC)    Chronic kidney disease    Colon polyps    hyperplastic   Diabetes mellitus type 2, diet-controlled (HCC) AND EXERCISE-- LAST A1C  5.6   Diverticulosis    Gait abnormality 11/08/2016   GERD (gastroesophageal reflux disease)    Glaucoma    H/O hiatal hernia    Heart murmur    History of esophageal stricture S/P DILATION IN 2007   Hyperlipidemia    Hypertension    IgM monoclonal gammopathy of uncertain significance ASYMPTOMATIC    FOLLOWED BY DR Alen Blew   Internal hemorrhoids    Macrocytic anemia DUE TO CHRONIC RENAL INSUFF.   Nocturia    Persistent atrial fibrillation (HCC)    Right knee pain    Past Surgical History:  Procedure Laterality Date   CATARACT EXTRACTION W/ INTRAOCULAR LENS  IMPLANT, BILATERAL  2012   HEMORRHOID SURGERY     HERNIA REPAIR     KNEE ARTHROSCOPY  12/25/2011   Procedure: ARTHROSCOPY KNEE;  Surgeon: Tobi Bastos, MD;  Location: [facility];  Service: Orthopedics;  Laterality: Right;  right knee arthroscopy with medial menisectomy      FAMILY HISTORY Family History  Problem Relation Age of Onset   CAD Father    Diabetes Brother    Heart attack Father    Early death Father    Hearing loss Father    Heart disease Father    Alcohol abuse  Sister    Cancer Sister     SOCIAL HISTORY Social History   Tobacco Use   Smoking status: Former    Packs/day: 1.00    Years: 10.00    Pack years: 10.00    Types: Cigarettes   Smokeless tobacco: Never  Substance Use Topics   Alcohol use: Yes   Drug  use: Never         OPHTHALMIC EXAM:  Base Eye Exam     Visual Acuity (ETDRS)       Right Left   Dist [place] 20/200 +1 20/30 -2   Dist ph [place] 20/80 +2 NI         Tonometry (Tonopen, 1:45 PM)       Right Left   Pressure 12 06         Pupils       Dark Light Shape React APD   Right 5 3 Round Brisk None   Left 2 2 Round Minimal +1         Visual Fields (Counting fingers)       Left Right     Full   Restrictions Partial outer inferior nasal deficiency          Extraocular Movement       Right Left    Full Full         Neuro/Psych     Oriented x3: Yes   Mood/Affect: Normal         Dilation     Right eye: 1.0% Mydriacyl, 2.5% Phenylephrine @ 1:45 PM           Slit Lamp and Fundus Exam     External Exam       Right Left   External Normal Normal         Slit Lamp Exam       Right Left   Lids/Lashes Normal Normal   Conjunctiva/Sclera White and quiet White and quiet   Cornea Clear Clear   Anterior Chamber Deep and quiet Deep and quiet   Iris Round and reactive Round and reactive   Lens Posterior chamber intraocular lens Posterior chamber intraocular lens   Anterior Vitreous Normal Normal         Fundus Exam       Right Left   Posterior Vitreous Posterior vitreous detachment    Disc Normal    C/D Ratio 0.15    Macula Retinal pigment epithelial mottling, Subretinal neovascular membrane, no hemorrhage, Retinal pigment epithelial atrophy, Macular atrophy, Early age related macular degeneration, no exudates, Epiretinal membrane    Vessels Normal    Periphery Normal             IMAGING AND PROCEDURES  Imaging and Procedures for 09/13/20  OCT, Retina - OU - Both Eyes        Right Eye Quality was good. Scan locations included subfoveal. Central Foveal Thickness: 310. Progression has been stable. Findings include abnormal foveal contour, epiretinal membrane, vitreous traction, cystoid macular edema, outer retinal atrophy, subretinal hyper-reflective material.   Left Eye Quality was good. Scan locations included subfoveal. Central Foveal Thickness: 259. Progression has improved. Findings include no IRF, no SRF, retinal drusen , outer retinal atrophy.   Notes OD, overall stable and much less perifoveal CME on intravitreal Avastin today at an interval of 7 weeks.  We will maintain same interval right eye at  weeks follow-up  OS, improved 7 weeks and 6 days after recent [place]ylea follow-up and scheduled      Intravitreal Injection, Pharmacologic Agent - OD - Right Eye       Time Out 09/13/2020. 2:45 PM. Confirmed correct patient, procedure, site, and patient consented.   Anesthesia Topical anesthesia was used. Anesthetic medications included Akten 3.5%.   Procedure Preparation included Tobramycin 0.3%, 10% betadine to eyelids, 5% betadine to ocular surface. A 30  gauge needle was used.   Injection: 2.5 mg bevacizumab 2.5 MG/0.1ML   Route: Intravitreal   NDC: 09735-329-92, Lot: [id number]   Post-op Post injection exam found visual acuity of at least counting fingers. The patient tolerated the procedure well. There were no complications. The patient received written and verbal post procedure care education. Post injection medications were not given.              ASSESSMENT/PLAN:  Exudative age-related macular degeneration of left eye with active choroidal neovascularization (HCC) 1 week after recent injection left eye and OCT is stable or improved  Exudative age-related macular degeneration of right eye with active choroidal neovascularization ([place]) Perifoveal CME much improved on intravitreal Avastin currently at 8-week follow-up in injection  today to maintain  Macular pucker, right eye Some effect on acuity yet with outer retinal scarring and foveal atrophy not likely to improve acuity to remove the tractional changes     ICD-10-CM   1. Exudative age-related macular degeneration of right eye with active choroidal neovascularization (HCC)  H35.3211 OCT, Retina - OU - Both Eyes    Intravitreal Injection, Pharmacologic Agent - OD - Right Eye    bevacizumab (AVASTIN) SOSY 2.5 mg    2. Advanced nonexudative age-related macular degeneration of left eye without subfoveal involvement  H35.3123     3. Exudative age-related macular degeneration of left eye with active choroidal neovascularization ([place])  H35.3221     4. Macular pucker, right eye  H35.371       1.  OS at 1 week follow-up after injection intravitreal Avastin for wet AMD, condition stable acuity stable follow-up OS as scheduled  2.  OD today at follow-up 8-week interval, with perifoveal CME controlled and subretinal fluid control now at 8-week interval post Avastin.  Repeat today to maintain and control dilate OU D again in 8 weeks  3.  Ophthalmic Meds Ordered this visit:  Meds ordered this encounter  Medications   bevacizumab (AVASTIN) SOSY 2.5 mg       Return in about 8 weeks (around 11/08/2020) for dilate, OD, AVASTIN OCT.  There are no Patient Instructions on file for this visit.   Explained the diagnoses, plan, and follow up with the patient and they expressed understanding.  Patient expressed understanding of the importance of proper follow up care.   Clent Demark Frank Pilger M.D. Diseases & Surgery of the Retina and Vitreous [facility]ina [facility] Diabetic [place] 09/13/20     Abbreviations: M myopia (nearsighted); A astigmatism; H hyperopia (farsighted); P presbyopia; Mrx spectacle prescription;  CTL contact lenses; OD right eye; OS left eye; OU both eyes  XT exotropia; ET esotropia; PEK punctate epithelial keratitis; PEE punctate epithelial erosions; DES dry eye  syndrome; MGD meibomian gland dysfunction; ATs artificial tears; PFAT's preservative free artificial tears; Welch nuclear sclerotic cataract; PSC posterior subcapsular cataract; ERM epi-retinal membrane; PVD posterior vitreous detachment; RD retinal detachment; DM diabetes mellitus; DR diabetic retinopathy; NPDR non-proliferative diabetic retinopathy; PDR proliferative diabetic retinopathy; CSME clinically significant macular edema; DME diabetic macular edema; dbh dot blot hemorrhages; CWS cotton wool spot; POAG primary open angle glaucoma; C/D cup-to-disc ratio; HVF humphrey visual field; GVF goldmann visual field; OCT optical coherence tomography; IOP intraocular pressure; BRVO Branch retinal vein occlusion; CRVO central retinal vein occlusion; CRAO central retinal artery occlusion; BRAO branch retinal artery occlusion; RT retinal tear; SB scleral buckle; PPV pars plana vitrectomy; VH Vitreous hemorrhage; PRP panretinal laser photocoagulation; IVK intravitreal kenalog; VMT vitreomacular traction; MH Macular hole;  NVD  neovascularization of the disc; NVE neovascularization elsewhere; AREDS age related eye disease study; ARMD age related macular degeneration; POAG primary open angle glaucoma; EBMD epithelial/anterior basement membrane dystrophy; ACIOL anterior chamber intraocular lens; IOL intraocular lens; PCIOL posterior chamber intraocular lens; Phaco/IOL phacoemulsification with intraocular lens placement; [place] photorefractive keratectomy; LASIK laser assisted in situ keratomileusis; HTN hypertension; DM diabetes mellitus; COPD chronic obstructive pulmonary disease

## 2020-09-13 NOTE — Assessment & Plan Note (Signed)
1 week after recent injection left eye and OCT is stable or improved

## 2020-09-13 NOTE — Assessment & Plan Note (Signed)
Some effect on acuity yet with outer retinal scarring and foveal atrophy not likely to improve acuity to remove the tractional changes

## 2020-09-13 NOTE — Addendum Note (Signed)
Addended by: Deloria Lair A on: 09/13/2020 05:10 PM   Modules accepted: Orders

## 2020-09-13 NOTE — Assessment & Plan Note (Signed)
Perifoveal CME much improved on intravitreal Avastin currently at 8-week follow-up in injection today to maintain

## 2020-09-13 NOTE — Assessment & Plan Note (Deleted)
1 week after recent injection left eye and OCT is stable or improved

## 2020-10-20 ENCOUNTER — Emergency Department (HOSPITAL_COMMUNITY): Payer: No Typology Code available for payment source

## 2020-10-20 ENCOUNTER — Other Ambulatory Visit (HOSPITAL_COMMUNITY): Payer: TRICARE For Life (TFL)

## 2020-10-20 DIAGNOSIS — E872 Acidosis, unspecified: Secondary | ICD-10-CM | POA: Insufficient documentation

## 2020-10-20 DIAGNOSIS — E1122 Type 2 diabetes mellitus with diabetic chronic kidney disease: Secondary | ICD-10-CM | POA: Diagnosis present

## 2020-10-20 DIAGNOSIS — R402252 Coma scale, best verbal response, oriented, at arrival to emergency department: Secondary | ICD-10-CM | POA: Diagnosis present

## 2020-10-20 DIAGNOSIS — Z66 Do not resuscitate: Secondary | ICD-10-CM | POA: Diagnosis present

## 2020-10-20 DIAGNOSIS — Z79899 Other long term (current) drug therapy: Secondary | ICD-10-CM

## 2020-10-20 DIAGNOSIS — S065X9A Traumatic subdural hemorrhage with loss of consciousness of unspecified duration, initial encounter: Secondary | ICD-10-CM | POA: Diagnosis present

## 2020-10-20 DIAGNOSIS — S066X9A Traumatic subarachnoid hemorrhage with loss of consciousness of unspecified duration, initial encounter: Secondary | ICD-10-CM | POA: Diagnosis present

## 2020-10-20 DIAGNOSIS — R351 Nocturia: Secondary | ICD-10-CM | POA: Diagnosis present

## 2020-10-20 DIAGNOSIS — I214 Non-ST elevation (NSTEMI) myocardial infarction: Secondary | ICD-10-CM

## 2020-10-20 DIAGNOSIS — S0291XB Unspecified fracture of skull, initial encounter for open fracture: Secondary | ICD-10-CM

## 2020-10-20 DIAGNOSIS — D472 Monoclonal gammopathy: Secondary | ICD-10-CM | POA: Diagnosis present

## 2020-10-20 DIAGNOSIS — I352 Nonrheumatic aortic (valve) stenosis with insufficiency: Secondary | ICD-10-CM | POA: Diagnosis present

## 2020-10-20 DIAGNOSIS — I609 Nontraumatic subarachnoid hemorrhage, unspecified: Secondary | ICD-10-CM

## 2020-10-20 DIAGNOSIS — I130 Hypertensive heart and chronic kidney disease with heart failure and stage 1 through stage 4 chronic kidney disease, or unspecified chronic kidney disease: Secondary | ICD-10-CM | POA: Diagnosis present

## 2020-10-20 DIAGNOSIS — S020XXB Fracture of vault of skull, initial encounter for open fracture: Secondary | ICD-10-CM | POA: Diagnosis present

## 2020-10-20 DIAGNOSIS — I5181 Takotsubo syndrome: Secondary | ICD-10-CM | POA: Diagnosis present

## 2020-10-20 DIAGNOSIS — W1830XA Fall on same level, unspecified, initial encounter: Secondary | ICD-10-CM | POA: Diagnosis present

## 2020-10-20 DIAGNOSIS — R001 Bradycardia, unspecified: Secondary | ICD-10-CM

## 2020-10-20 DIAGNOSIS — G9349 Other encephalopathy: Secondary | ICD-10-CM | POA: Diagnosis present

## 2020-10-20 DIAGNOSIS — I442 Atrioventricular block, complete: Principal | ICD-10-CM | POA: Insufficient documentation

## 2020-10-20 DIAGNOSIS — I480 Paroxysmal atrial fibrillation: Secondary | ICD-10-CM | POA: Diagnosis present

## 2020-10-20 DIAGNOSIS — Z20822 Contact with and (suspected) exposure to covid-19: Secondary | ICD-10-CM | POA: Diagnosis present

## 2020-10-20 DIAGNOSIS — R55 Syncope and collapse: Secondary | ICD-10-CM | POA: Diagnosis not present

## 2020-10-20 DIAGNOSIS — Y92090 Kitchen in other non-institutional residence as the place of occurrence of the external cause: Secondary | ICD-10-CM

## 2020-10-20 DIAGNOSIS — Z888 Allergy status to other drugs, medicaments and biological substances status: Secondary | ICD-10-CM

## 2020-10-20 DIAGNOSIS — Z7901 Long term (current) use of anticoagulants: Secondary | ICD-10-CM

## 2020-10-20 DIAGNOSIS — E785 Hyperlipidemia, unspecified: Secondary | ICD-10-CM | POA: Diagnosis present

## 2020-10-20 DIAGNOSIS — I5041 Acute combined systolic (congestive) and diastolic (congestive) heart failure: Secondary | ICD-10-CM | POA: Diagnosis not present

## 2020-10-20 DIAGNOSIS — D72829 Elevated white blood cell count, unspecified: Secondary | ICD-10-CM | POA: Diagnosis present

## 2020-10-20 DIAGNOSIS — H4010X0 Unspecified open-angle glaucoma, stage unspecified: Secondary | ICD-10-CM | POA: Diagnosis present

## 2020-10-20 DIAGNOSIS — G9340 Encephalopathy, unspecified: Secondary | ICD-10-CM | POA: Insufficient documentation

## 2020-10-20 DIAGNOSIS — N179 Acute kidney failure, unspecified: Secondary | ICD-10-CM | POA: Diagnosis present

## 2020-10-20 DIAGNOSIS — K219 Gastro-esophageal reflux disease without esophagitis: Secondary | ICD-10-CM | POA: Diagnosis present

## 2020-10-20 DIAGNOSIS — I5043 Acute on chronic combined systolic (congestive) and diastolic (congestive) heart failure: Secondary | ICD-10-CM | POA: Diagnosis present

## 2020-10-20 DIAGNOSIS — H3530 Unspecified macular degeneration: Secondary | ICD-10-CM | POA: Diagnosis present

## 2020-10-20 DIAGNOSIS — R402362 Coma scale, best motor response, obeys commands, at arrival to emergency department: Secondary | ICD-10-CM | POA: Diagnosis present

## 2020-10-20 DIAGNOSIS — R402142 Coma scale, eyes open, spontaneous, at arrival to emergency department: Secondary | ICD-10-CM | POA: Diagnosis present

## 2020-10-20 DIAGNOSIS — I482 Chronic atrial fibrillation, unspecified: Secondary | ICD-10-CM | POA: Diagnosis present

## 2020-10-20 DIAGNOSIS — Z23 Encounter for immunization: Secondary | ICD-10-CM

## 2020-10-20 DIAGNOSIS — S065XAA Traumatic subdural hemorrhage with loss of consciousness status unknown, initial encounter: Secondary | ICD-10-CM | POA: Insufficient documentation

## 2020-10-20 DIAGNOSIS — I4891 Unspecified atrial fibrillation: Secondary | ICD-10-CM | POA: Insufficient documentation

## 2020-10-20 DIAGNOSIS — T45515A Adverse effect of anticoagulants, initial encounter: Secondary | ICD-10-CM | POA: Diagnosis present

## 2020-10-20 DIAGNOSIS — N184 Chronic kidney disease, stage 4 (severe): Secondary | ICD-10-CM | POA: Diagnosis present

## 2020-10-20 DIAGNOSIS — I4510 Unspecified right bundle-branch block: Secondary | ICD-10-CM | POA: Insufficient documentation

## 2020-10-20 DIAGNOSIS — N401 Enlarged prostate with lower urinary tract symptoms: Secondary | ICD-10-CM | POA: Diagnosis present

## 2020-10-20 DIAGNOSIS — Z8249 Family history of ischemic heart disease and other diseases of the circulatory system: Secondary | ICD-10-CM

## 2020-10-20 DIAGNOSIS — Z833 Family history of diabetes mellitus: Secondary | ICD-10-CM

## 2020-10-20 DIAGNOSIS — K5790 Diverticulosis of intestine, part unspecified, without perforation or abscess without bleeding: Secondary | ICD-10-CM | POA: Diagnosis present

## 2020-10-20 DIAGNOSIS — I471 Supraventricular tachycardia: Secondary | ICD-10-CM | POA: Diagnosis present

## 2020-10-20 LAB — COMPREHENSIVE METABOLIC PANEL
ALT: 148 U/L — ABNORMAL HIGH (ref 0–44)
AST: 198 U/L — ABNORMAL HIGH (ref 15–41)
Albumin: 3.5 g/dL (ref 3.5–5.0)
Alkaline Phosphatase: 70 U/L (ref 38–126)
Anion gap: 24 — ABNORMAL HIGH (ref 5–15)
BUN: 76 mg/dL — ABNORMAL HIGH (ref 8–23)
CO2: 13 mmol/L — ABNORMAL LOW (ref 22–32)
Calcium: 9.2 mg/dL (ref 8.9–10.3)
Chloride: 101 mmol/L (ref 98–111)
Creatinine, Ser: 5.63 mg/dL — ABNORMAL HIGH (ref 0.61–1.24)
GFR, Estimated: 9 mL/min — ABNORMAL LOW (ref >60)
Glucose, Bld: 179 mg/dL — ABNORMAL HIGH (ref 70–99)
Potassium: 4.3 mmol/L (ref 3.5–5.1)
Sodium: 138 mmol/L (ref 135–145)
Total Bilirubin: 1.3 mg/dL — ABNORMAL HIGH (ref 0.3–1.2)
Total Protein: 6.5 g/dL (ref 6.5–8.1)

## 2020-10-20 LAB — CK: Total CK: 722 U/L — ABNORMAL HIGH (ref 49–397)

## 2020-10-20 LAB — CBC WITH DIFFERENTIAL/PLATELET
Abs Immature Granulocytes: 0.09 10*3/uL — ABNORMAL HIGH (ref 0.00–0.07)
Basophils Absolute: 0.1 10*3/uL (ref 0.0–0.1)
Basophils Relative: 0 %
Eosinophils Absolute: 0 10*3/uL (ref 0.0–0.5)
Eosinophils Relative: 0 %
HCT: 33.2 % — ABNORMAL LOW (ref 39.0–52.0)
Hemoglobin: 9.8 g/dL — ABNORMAL LOW (ref 13.0–17.0)
Immature Granulocytes: 1 %
Lymphocytes Relative: 6 %
Lymphs Abs: 1.1 10*3/uL (ref 0.7–4.0)
MCH: 32.5 pg (ref 26.0–34.0)
MCHC: 29.5 g/dL — ABNORMAL LOW (ref 30.0–36.0)
MCV: 109.9 fL — ABNORMAL HIGH (ref 80.0–100.0)
Monocytes Absolute: 1.9 10*3/uL — ABNORMAL HIGH (ref 0.1–1.0)
Monocytes Relative: 11 %
Neutro Abs: 14.6 10*3/uL — ABNORMAL HIGH (ref 1.7–7.7)
Neutrophils Relative %: 82 %
Platelets: 168 10*3/uL (ref 150–400)
RBC: 3.02 MIL/uL — ABNORMAL LOW (ref 4.22–5.81)
RDW: 13.8 % (ref 11.5–15.5)
WBC: 17.7 10*3/uL — ABNORMAL HIGH (ref 4.0–10.5)
nRBC: 0 % (ref 0.0–0.2)

## 2020-10-20 LAB — LIPASE, BLOOD: Lipase: 31 U/L (ref 11–51)

## 2020-10-20 LAB — TSH: TSH: 17.283 u[IU]/mL — ABNORMAL HIGH (ref 0.350–4.500)

## 2020-10-20 LAB — TROPONIN I (HIGH SENSITIVITY)
Troponin I (High Sensitivity): >24000 ng/L (ref <18)
Troponin I (High Sensitivity): >24000 ng/L (ref <18)

## 2020-10-20 LAB — RESP PANEL BY RT-PCR (FLU A&B, COVID) ARPGX2
Influenza A by PCR: NEGATIVE
Influenza B by PCR: NEGATIVE
SARS Coronavirus 2 by RT PCR: NEGATIVE

## 2020-10-20 LAB — PROCALCITONIN: Procalcitonin: 1.16 ng/mL

## 2020-10-20 LAB — MAGNESIUM: Magnesium: 3 mg/dL — ABNORMAL HIGH (ref 1.7–2.4)

## 2020-10-20 LAB — LACTIC ACID, PLASMA: Lactic Acid, Venous: >11 mmol/L (ref 0.5–1.9)

## 2020-10-20 MED ORDER — POLYETHYLENE GLYCOL 3350 17 G PO PACK: 17.0000 g | PACK | Freq: Every day | ORAL | Status: DC | PRN

## 2020-10-20 MED ORDER — SODIUM CHLORIDE 0.9 % IV SOLN: INTRAVENOUS | Status: DC

## 2020-10-20 MED ORDER — ONDANSETRON HCL 4 MG/2ML IJ SOLN
4.0000 mg | Freq: Once | INTRAMUSCULAR | Status: AC
  Administered 2020-10-20: 4 mg via INTRAVENOUS

## 2020-10-20 MED ORDER — PROTHROMBIN COMPLEX CONC HUMAN 500 UNITS IV KIT
3317.0000 [IU] | PACK | Status: AC
  Administered 2020-10-20: 3317 [IU] via INTRAVENOUS
  Filled 2020-10-20: qty 3317

## 2020-10-20 MED ORDER — DOCUSATE SODIUM 100 MG PO CAPS: 100.0000 mg | ORAL_CAPSULE | Freq: Two times a day (BID) | ORAL | Status: DC | PRN

## 2020-10-20 MED ORDER — TETANUS-DIPHTH-ACELL PERTUSSIS 5-2.5-18.5 LF-MCG/0.5 IM SUSY
0.5000 mL | PREFILLED_SYRINGE | Freq: Once | INTRAMUSCULAR | Status: AC
  Administered 2020-10-20: 0.5 mL via INTRAMUSCULAR
  Filled 2020-10-20: qty 0.5

## 2020-10-20 MED ORDER — ONDANSETRON HCL 4 MG/2ML IJ SOLN: 4.0000 mg | Freq: Four times a day (QID) | INTRAMUSCULAR | Status: DC | PRN

## 2020-10-20 MED ORDER — INSULIN ASPART 100 UNIT/ML IJ SOLN: 0.0000 [IU] | INTRAMUSCULAR | Status: DC

## 2020-10-20 MED ORDER — LEVETIRACETAM IN NACL 500 MG/100ML IV SOLN
500.0000 mg | Freq: Two times a day (BID) | INTRAVENOUS | Status: DC
  Administered 2020-10-20: 500 mg via INTRAVENOUS
  Filled 2020-10-20 (×2): qty 100

## 2020-10-31 NOTE — ED Notes (Signed)
troponin 24000. MD made aware.

## 2020-10-31 NOTE — Death Summary Note (Signed)
DEATH SUMMARY   Patient Details  Name: Adam [name] MRN: [id number] DOB: 05-Aug-1930  Admission/Discharge Information   Admit Date:  12-Nov-2020  Date of Death: Date of Death: Nov 12, 2020  Time of Death: Time of Death: 1939-07-16  Length of Stay: 1  Referring Physician: Javier Glazier, MD   Reason(s) for Hospitalization  Traumatic subarachnoid hemorrhage/subdural hemorrhage status post fall in the setting of anticoagulation with Eliquis Chronic atrial fibrillation with recurrent asymptomatic bradycardia, on Eliquis Acute encephalopathy due to subarachnoid hemorrhage Status post fall due to syncopal episode Non-ST elevation MI versus demand cardiac ischemia Lactic acidosis Chronic diastolic congestive heart failure AKI on CKD stage IV Diabetes type 2  Diagnoses  Preliminary cause of death: Complete Heart block Secondary Diagnoses (including complications and co-morbidities):  Active Problems:   Subarachnoid bleed (HCC)   Syncope, cardiogenic   Brief Hospital Course (including significant findings, care, treatment, and services provided and events leading to death)  Adam [name] is a 87 y.o. year old male who who was admitted with traumatic subarachnoid hemorrhage status post fall in the setting of syncopal episode, in the emergency department patient was having frequent episodes of asymptomatic bradycardia, then he was noted to have significant pauses with complete heart block.  Bedside echocardiogram was performed, showed reduced systolic ejection fraction which is new, with apical ballooning could be due to Takotsubo caused by traumatic subarachnoid hemorrhage.  Official echocardiogram was ordered, cardiology was consulted, they recommend pacer pads and down the line evaluation for permanent pacemaker.  Patient was transferred to ICU while he was talking, suddenly he went into complete heart block and lost his pulse and became asystolic.  Patient's daughter stated that he is DNR so CPR was  not initiated per patient wishes.  He was declared dead on Nov 12, 2020 at 7:41 PM    Pertinent Labs and Studies  Significant Diagnostic Studies CT Head Wo Contrast  Result Date: 2020-11-12 CLINICAL DATA:  Fall. EXAM: CT HEAD WITHOUT CONTRAST TECHNIQUE: Contiguous axial images were obtained from the base of the skull through the vertex without intravenous contrast. COMPARISON:  CT head dated Aug 20, 2020. FINDINGS: Brain: New thin acute extra-axial hematoma overlying the right frontotemporal region with associated subarachnoid hemorrhage extending into the sulci and sylvian fissure. Smaller acute subdural hematoma over the anterior left frontal lobe with underlying small volume subarachnoid hemorrhage. Small volume subarachnoid hemorrhage in both anterior inferior frontal lobes. Thin acute subdural hematoma along the anterior falx. Small acute subdural hematoma overlying the posterior right parietal lobe. No significant mass effect or midline shift. No intraventricular hemorrhage or hydrocephalus. Vascular: Calcified atherosclerosis at the skullbase. No hyperdense vessel. Skull: Acute nondisplaced fracture of the right parietal bone. Sinuses/Orbits: No acute finding. Other: Small right temporoparietal scalp hematoma. IMPRESSION: 1. New small acute extra-axial hematoma overlying the right frontotemporal region with associated subarachnoid hemorrhage extending into the sulci and Sylvian fissure. Overlying acute nondisplaced fracture of the right parietal bone. No mass effect or midline shift. 2. New small acute subdural hematoma over the anterior left frontal lobe with underlying small volume subarachnoid hemorrhage. 3. New small acute subdural hematomas along the anterior falx and overlying the posterior right parietal lobe. 4. New small volume subarachnoid hemorrhage in both anterior inferior frontal lobes. Critical Value/emergent results were called by telephone at the time of interpretation on 11-12-20 at  2:06 pm to provider [facility] , who verbally acknowledged these results. Electronically Signed   By: Titus Dubin M.D.   On: 2020-11-12 14:17   CT  Cervical Spine Wo Contrast  Result Date: 11/04/2020 CLINICAL DATA:  Fall. EXAM: CT CERVICAL SPINE WITHOUT CONTRAST TECHNIQUE: Multidetector CT imaging of the cervical spine was performed without intravenous contrast. Multiplanar CT image reconstructions were also generated. COMPARISON:  None. FINDINGS: Alignment: No traumatic malalignment. Skull base and vertebrae: No acute fracture. No primary bone lesion or focal pathologic process. Soft tissues and spinal canal: No prevertebral fluid or swelling. No visible canal hematoma. Disc levels: Unchanged multilevel disc bulging and facet uncovertebral hypertrophy. Similar moderate calcified central disc protrusion at C4-C5. Upper chest: Negative. Other: None. IMPRESSION: 1. No acute cervical spine fracture or traumatic malalignment. 2. Unchanged multilevel cervical spondylosis. Electronically Signed   By: Titus Dubin M.D.   On: 04-Nov-2020 14:24   DG Chest Portable 1 View  Result Date: 04-Nov-2020 CLINICAL DATA:  Trauma, fell and hit head, nausea EXAM: PORTABLE CHEST 1 VIEW COMPARISON:  None. FINDINGS: The cardiac silhouette is mildly enlarged. Low lung volumes with left basilar subsegmental atelectasis and elevated left hemidiaphragm. There are right mid to lower lung opacities. There is no large pleural effusion or visible pneumothorax. No acute osseous abnormality. IMPRESSION: Low lung volumes. Right mid to lower lung opacities which could be infection/aspiration and/or atelectasis. Electronically Signed   By: Maurine Simmering   On: 11-04-20 14:03    Microbiology Recent Results (from the past 240 hour(s))  Resp Panel by RT-PCR (Flu A&B, Covid) Nasopharyngeal Swab     Status: None   Collection Time: 2020-11-04  3:16 PM   Specimen: Nasopharyngeal Swab; Nasopharyngeal(NP) swabs in vial transport medium   Result Value Ref Range Status   SARS Coronavirus 2 by RT PCR NEGATIVE NEGATIVE Final    Comment: (NOTE) SARS-CoV-2 target nucleic acids are NOT DETECTED.  The SARS-CoV-2 RNA is generally detectable in upper respiratory specimens during the acute phase of infection. The lowest concentration of SARS-CoV-2 viral copies this assay can detect is 138 copies/mL. A negative result does not preclude SARS-Cov-2 infection and should not be used as the sole basis for treatment or other patient management decisions. A negative result may occur with  improper specimen collection/handling, submission of specimen other than nasopharyngeal swab, presence of viral mutation(s) within the areas targeted by this assay, and inadequate number of viral copies(<138 copies/mL). A negative result must be combined with clinical observations, patient history, and epidemiological information. The expected result is Negative.  Fact Sheet for Patients:  EntrepreneurPulse.com.au  Fact Sheet for Healthcare Providers:  IncredibleEmployment.be  This test is no t yet approved or cleared by the [place] FDA and  has been authorized for detection and/or diagnosis of SARS-CoV-2 by FDA under an Emergency Use Authorization (EUA). This EUA will remain  in effect (meaning this test can be used) for the duration of the COVID-19 declaration under Section 564(b)(1) of the Act, 21 U.S.C.section 360bbb-3(b)(1), unless the authorization is terminated  or revoked sooner.       Influenza A by PCR NEGATIVE NEGATIVE Final   Influenza B by PCR NEGATIVE NEGATIVE Final    Comment: (NOTE) The Xpert Xpress SARS-CoV-2/FLU/RSV plus assay is intended as an aid in the diagnosis of influenza from Nasopharyngeal swab specimens and should not be used as a sole basis for treatment. Nasal washings and aspirates are unacceptable for Xpert Xpress SARS-CoV-2/FLU/RSV testing.  Fact Sheet for  Patients: EntrepreneurPulse.com.au  Fact Sheet for Healthcare Providers: IncredibleEmployment.be  This test is not yet approved or cleared by the [place] FDA and has been authorized for detection and/or  diagnosis of SARS-CoV-2 by FDA under an Emergency Use Authorization (EUA). This EUA will remain in effect (meaning this test can be used) for the duration of the COVID-19 declaration under Section 564(b)(1) of the Act, 21 U.S.C. section 360bbb-3(b)(1), unless the authorization is terminated or revoked.  Performed at [facility], [place] [address], [place], [place] [address]     Lab Basic Metabolic Panel: Recent Labs  Lab 11/17/20 1332  NA 138  K 4.3  CL 101  CO2 13*  GLUCOSE 179*  BUN 76*  CREATININE 5.63*  CALCIUM 9.2  MG 3.0*   Liver Function Tests: Recent Labs  Lab 11/17/20 1332  AST 198*  ALT 148*  ALKPHOS 70  BILITOT 1.3*  PROT 6.5  ALBUMIN 3.5   Recent Labs  Lab Nov 17, 2020 1332  LIPASE 31   No results for input(s): AMMONIA in the last 168 hours. CBC: Recent Labs  Lab 17-Nov-2020 1332  WBC 17.7*  NEUTROABS 14.6*  HGB 9.8*  HCT 33.2*  MCV 109.9*  PLT 168   Cardiac Enzymes: Recent Labs  Lab November 17, 2020 1532  CKTOTAL 722*   Sepsis Labs: Recent Labs  Lab 17-Nov-2020 1332 November 17, 2020 1532  PROCALCITON  --  1.16  WBC 17.7*  --   LATICACIDVEN >11*  --     Procedures/Operations     [facility] 10/25/2020, 7:36 PM

## 2020-10-31 NOTE — Progress Notes (Signed)
87 year old male status post fall secondary to syncopal episode.  Patient complains of headache.  No other neurologic symptoms.  Patient with intermittent episodes of significant bradycardia worrisome for some hemodynamic instability.  Patient situation complicated by anticoagulation (Eliquis)  Head CT scan with right temporal traumatic subarachnoid hemorrhage and left frontal traumatic subarachnoid hemorrhage.  Patient also with a small right parietal occipital subdural without mass-effect.  No indication for emergent neurosurgical intervention.  Recommend reversal of anticoagulation and ICU observation.  Recommend follow-up head CT scan in morning.  Critical care/medicine to admit for evaluation of syncope and possible cardiac dysfunction

## 2020-10-31 NOTE — H&P (Addendum)
NAME:  Adam [name], MRN:  [id number], DOB:  14-Feb-1931, LOS: 0 ADMISSION DATE:  November 18, 2020, CONSULTATION DATE:  11-18-20 REFERRING MD:  Tegeler - EM, CHIEF COMPLAINT:  Fall, traumatic SAH   History of Present Illness:  87yo M PMH recent SDH (07/2020), Afib on eliquis, diastolic HF, CKD IV, BPH, HTN who presented to ED Nov 18, 2020 with head injury / possible syncope. Patient last known normal 7/21 approx 10am, later found down and unresponsive, with bleeding from head. Did become more aroused, placed in Ccollar, brought to ED by EMS. Bradycardic in transit HR 30-40. Reportedly disoriented with EMS.  In ED, CT H reveals traumatic SAH, skull fracture. Received Kcentra. NSGY has evaluated and recommends non-surgical management and PCCM admission   In ED, pt more oriented. History obtained by EDP-- pt remembers standing up in his room and the next recollection is waking up on the floor. Last took eliquis 7/20.   There are limited labs for review at time of PCCM consultation LA 11, WBC 17.7 TSH 17   ECG with RBBB and LAFB, borderline prolonged PR which have been present on prior ECGs   CCM consulted for admission   Pertinent  Medical History  SDH Afib on Eliquis  HTN HLD Macular degeneration BPH CKD IV Gait abnormality  Diastolic heart failure  DM2  Monoclonal gammopathy  Lumbar spinal stenosis  Significant Hospital Events: Including procedures, antibiotic start and stop dates in addition to other pertinent events   7/21 traumatic SAH. Given Kcentra. Non-op mgmnt recommended. Admitting to ICU. HR ranging from 30-90.   Interim History / Subjective:  Variable HR 30-90  Received Kcentra   Objective   Blood pressure 129/62, pulse (!) 47, resp. rate 20, weight 64 kg, SpO2 93 %.       No intake or output data in the 24 hours ending November 18, 2020 1510 Filed Weights   2020/11/18 1400  Weight: 64 kg    Examination: General: chronically ill appearing elderly M, R side lying in ED, NAD  HENT:  Multiple posterior scalp skin tears with dried blood. Anicteric sclera. Chronic pupillary defect.  Lungs: CTAb symmetrical chest expansion, even and unlabored  Cardiovascular: variable HR and rhythm. Frequent bradycardia. Distant heart sounds. Cap refill < 3 seconds  Abdomen: sound round ndnt  Extremities: no acute joint deformity. No cyanosis or clubbing  Neuro: Awake alert. Oriented x 2 GU: defer Skin: + skin turgor. Scattered ecchymosis.   Resolved Hospital Problem list    Assessment & Plan:   Acute encephalopathy -SAH as below. ?metabolic process. ?sz.  -reportedly pt is not confused at baseline per daughter P -workup as below -limit CNS depressing meds   Traumatic SAH, on eliquis -in setting of fall from likely syncopal episode  -has had Kcentra  Recent traumatic SDH on eliquis  P -admit to ICU -close neuro monitoring -repeat CT H tonight (ordered for 2000)  -NSGY following  -Keppra as below   Fall / possible syncope  -not clear if syncope caused fall or if pt fell and had + LOC due to [facility]  -doubt etiology like PE given eliquis -?seizure  P -will get an ECHO -RBBB as below  -PT/OT when appropriate  -seizure precautions -empiric Keppra   Lactic Acidosis Leukocytosis -etiology unclear. No preceding sx of acute infectious illness. If timeline of Hx is correct, doesn't seem like his time on the ground would cause Rhabdo. No hx seizure, but can't exclude and this could explain possible syncope. Doubt reactive to something like clot  given eliquis. Appears dehydrated, might be slightly contributing.  P -PCT, CK -awaiting CMP  -no empiric abx for now  -sz as above  -IVF   Hx AFib on Eliquis Bradycardia / RBBB  -RBBB seen on ECG last admit.  -In ED, pt cold and TSH came back high at 17. Might be contributing to RBBB  Hx diastolic heart failure  Hx HTN P -ICU monitoring  -bair hugger  -will clarify hx ? Hypothyroidism and home meds if any. If no hx will order  further labs  -Hold [facility] acutely given SAH -Big picture: pt has had two intracranial events (traumatic SDH and now traumatic SAH) on eliquis. Not sure that the risk/benefit weighs in favor of resuming AC for this patient.  -hold home amio, lasix, hydral   CKD IV P -awaiting CMP   DM2  P -SSI   DNR status  -pt arrives with gold DNR form. Confirmed DNR status with daughter   Best Practice (right click and "Reselect all SmartList Selections" daily)   Diet/type: NPO DVT prophylaxis: SCD GI prophylaxis: N/A Lines: N/A Foley:  N/A Code Status:  DNR Last date of multidisciplinary goals of care discussion [pending] Pt daughter updated in ED at bedside by CCM   Labs   CBC: Recent Labs  Lab 05-Nov-2020 1332  WBC 17.7*  NEUTROABS 14.6*  HGB 9.8*  HCT 33.2*  MCV 109.9*  PLT 829    Basic Metabolic Panel: No results for input(s): NA, K, CL, CO2, GLUCOSE, BUN, CREATININE, CALCIUM, MG, PHOS in the last 168 hours. GFR: CrCl cannot be calculated (No successful lab value found.). Recent Labs  Lab 11/05/20 1332  WBC 17.7*    Liver Function Tests: No results for input(s): AST, ALT, ALKPHOS, BILITOT, PROT, ALBUMIN in the last 168 hours. No results for input(s): LIPASE, AMYLASE in the last 168 hours. No results for input(s): AMMONIA in the last 168 hours.  ABG No results found for: PHART, PCO2ART, PO2ART, HCO3, TCO2, ACIDBASEDEF, O2SAT   Coagulation Profile: No results for input(s): INR, PROTIME in the last 168 hours.  Cardiac Enzymes: No results for input(s): CKTOTAL, CKMB, CKMBINDEX, TROPONINI in the last 168 hours.  HbA1C: No results found for: HGBA1C  CBG: No results for input(s): GLUCAP in the last 168 hours.  Review of Systems:   Unable to obtain due to encephalopathy   Past Medical History:  He,  has no past medical history on file.  SDH Afib  Chronic anticoagulation HTN HLD Gait abnormality Macular degeneration Open angle glaucoma  GERD Diverticulosis   CKD IV DNR status    Surgical History:   CATARACT EXTRACTION W/ INTRAOCULAR LENS  IMPLANT, BILATERAL   2012    HEMORRHOID SURGERY       HERNIA REPAIR       KNEE ARTHROSCOPY   12/25/2011    Procedure: ARTHROSCOPY KNEE;  Surgeon: Tobi Bastos, MD;  Location: [facility];  Service: Orthopedics;  Laterality: Right;  right knee arthroscopy with medial menisectomy     Social History:      Family History:  His family history is not on file.  Hx CAD, MI  Allergies Not on File   Home Medications  Prior to Admission medications   Not on File     Critical care time: 50 minutes     CRITICAL CARE Performed by: Cristal Generous   Total critical care time: 50 minutes  Critical care time was exclusive of separately billable procedures and treating other  patients.  Critical care was necessary to treat or prevent imminent or life-threatening deterioration.  Critical care was time spent personally by me on the following activities: development of treatment plan with patient and/or surrogate as well as nursing, discussions with consultants, evaluation of patient's response to treatment, examination of patient, obtaining history from patient or surrogate, ordering and performing treatments and interventions, ordering and review of laboratory studies, ordering and review of radiographic studies, pulse oximetry and re-evaluation of patient's condition.   Eliseo Gum MSN, AGACNP-BC [place] for pager October 25, 2020, 4:23 PM

## 2020-10-31 NOTE — ED Triage Notes (Signed)
Pt bib [facility] from [place] d/t an unwitnessed fall. Per ems, pt was found unresponsive by staff, but was responsive to voice stimuli upon their arrival. Pt was A&O x 2 with ems. Skin tear noted on pt's posterior head. Pt is on Eliquis.   Ems vitals:  124/48 Pulse 46  R 18  95% RA  Cbg 171

## 2020-10-31 NOTE — Consult Note (Signed)
Cardiology Consultation:   Patient ID: Adam [name] MRN: [id number]; DOB: April 20, 1930  Admit date: 11/07/2020 Date of Consult: 11-07-20  PCP:  Adam Glazier, MD   [facility] Providers Cardiologist:  None   =   Patient Profile:   Adam [name] is a 87 y.o. male with a hx of chronic atrial fibrillation, CKD 4, diabetes and falls who is being seen Nov 07, 2020 for the evaluation of syncope and subarachnoid/subdural hemorrhage, and acute systolic diastolic heart failure at the request of Adam Gum, NP.  History of Present Illness:   Mr. Grismer notes that yesterday he was a little fatigued and spent most of the day in bed.  He has otherwise felt well and has not had any specific symptoms.  Today he was in his kitchen at his independent living facility when the monitoring system noted that he was no longer moving around the room.  Security entered and found him on the floor.  He does not recall what happened.  He has not been experiencing any chest pain, shortness of breath, fever, chills, productive cough, or dysuria.  When EMS arrived he was reportedly bradycardic in the 30s to 40s and disoriented.  He was bleeding from his head.  A c-collar was placed and he was brought to the emergency department for further evaluation.  Head CT revealed a traumatic subdural arachnoid and subdural hematoma and a skull fracture.  He was given Kcentra to reverse his Eliquis.  He was evaluated by neurosurgery who recommended nonsurgical management and PCCM admission.  Bedside echo on admission was concerning for acute systolic dysfunction as well as apical ballooning.  His echo 08/2019 revealed normal systolic function and moderate aortic stenosis.  Cardiac enzymes drawn and high-sensitivity troponin was over 24,000.  A formal echocardiogram is pending.  TSH is significantly elevated at 17.3.  Lactate was greater than 11.  He was admitted to critical care and cardiology was consulted for the above.  He was given  Kcentra to reverse his Eliquis.  On review of his telemetry he was noted to have multiple episodes of Mobitz type II second-degree heart block as well as a 7-second episode of complete heart block with no ventricular escape.  During the interview his daughter provided much of the history.  He was unable to recall anything about today's events.  He has been on amiodarone but no other nodal agents.   Diagnosis Date   Acute meniscal tear of knee RIGHT KNEE   Arthritis     BPH (benign prostatic hypertrophy)     Colon polyps      hyperplastic   Diabetes mellitus type 2, diet-controlled (HCC) AND EXERCISE-- LAST A1C 5.6   Diverticulosis     Gait abnormality 11/08/2016   GERD (gastroesophageal reflux disease)     Glaucoma     H/O hiatal hernia     Heart murmur     History of esophageal stricture S/P DILATION IN 2007   Hyperlipidemia     Hypertension     IgM monoclonal gammopathy of uncertain significance ASYMPTOMATIC    FOLLOWED BY DR Alen Blew   Internal hemorrhoids     Macrocytic anemia DUE TO CHRONIC RENAL INSUFF.   Nocturia     Right knee pain               Past Surgical History:  Procedure Laterality Date   CATARACT EXTRACTION W/ INTRAOCULAR LENS IMPLANT, BILATERAL   2012   HEMORRHOID SURGERY       HERNIA REPAIR  KNEE ARTHROSCOPY   12/25/2011    Procedure: ARTHROSCOPY KNEE; Surgeon: Tobi Bastos, MD; Location: [facility]; Service: Orthopedics; Laterality: Right; right knee arthroscopy with medial menisectomy    Home Medications:  Pertinent cardiac meds: Hydralazine 100 mg twice daily Lasix 20 mg daily Atorvastatin 20 mg daily Amiodarone 200 mg daily Gabapentin 100 mg 3 times a day  Inpatient Medications: Scheduled Meds:  insulin aspart  0-9 Units Subcutaneous Q4H   Continuous Infusions:  sodium chloride 75 mL/hr at 10-28-20 1904   levETIRAcetam Stopped (10/28/20 1905)   PRN Meds: docusate sodium, ondansetron (ZOFRAN) IV, polyethylene  glycol  Allergies:    Allergies  Allergen Reactions   Flomax [Tamsulosin Hcl] Anaphylaxis    Noted on OSH allergy list    Social History:   Social History   Socioeconomic History   Marital status: Single    Spouse name: Not on file   Number of children: Not on file   Years of education: Not on file   Highest education level: Not on file  Occupational History   Not on file  Tobacco Use   Smoking status: Not on file   Smokeless tobacco: Not on file  Substance and Sexual Activity   Alcohol use: Not on file   Drug use: Not on file   Sexual activity: Not on file  Other Topics Concern   Not on file  Social History Narrative   Not on file   Social Determinants of Health   Financial Resource Strain: Not on file  Food Insecurity: Not on file  Transportation Needs: Not on file  Physical Activity: Not on file  Stress: Not on file  Social Connections: Not on file  Intimate Partner Violence: Not on file    Family History:   The patient's family history includes Alcohol abuse in his sister; Cancer in his sister; Diabetes in his brother; Early death in his father; Hearing loss in his father; Heart attack in his father; Heart disease in his father.   ROS:  Please see the history of present illness.   All other ROS reviewed and negative.     Physical Exam/Data:   Vitals:   Oct 28, 2020 1545 10/28/20 1600 10-28-20 1715 10-28-2020 1815  BP: 111/64 105/65 124/67 124/69  Pulse: 88 87 88 86  Resp: 16 19 13 18   SpO2: 98% 98% 92% 93%  Weight:       No intake or output data in the 24 hours ending 10/28/20 1906 Last 3 Weights 2020-10-28  Weight (lbs) 141 lb 1.5 oz  Weight (kg) 64 kg     There is no height or weight on file to calculate BMI.  General:  Frail.  Lying flat in no acute distress HEENT: normal Lymph: no adenopathy Neck: no JVD Endocrine:  No thryomegaly Vascular: No carotid bruits; FA pulses 2+ bilaterally without bruits  Cardiac:  normal S1, S2; RRR; II/VI  mid-peaking systolic murmur at the LUSB Lungs:  clear to auscultation bilaterally, no wheezing, rhonchi or rales  Abd: soft, nontender, no hepatomegaly  Ext: no edema Musculoskeletal:  No deformities, BUE and BLE strength normal and equal Skin: warm and dry  Neuro:  CNs 2-12 intact, no focal abnormalities noted Psych:  Normal affect.  Frequently stares off during conversation  EKG:  The EKG was personally reviewed and demonstrates: Sinus bradycardia.  Rate 47 bpm.  Right bundle branch block.  Left anterior fascicular block.  LVH with secondary repolarization abnormalities Telemetry:  Telemetry was personally reviewed and  demonstrates: Sinus rhythm/sinus bradycardia.  Mobitz 2 second-degree heart block.  Complete heart block with no ventricular escape for 7 seconds  Relevant CV Studies:  Echo 08/2019: IMPRESSIONS     1. Left ventricular ejection fraction, by estimation, is 60 to 65%. The  left ventricle has normal function. The left ventricle has no regional  wall motion abnormalities. Left ventricular diastolic parameters are  consistent with Grade II diastolic  dysfunction (pseudonormalization).   2. Right ventricular systolic function is normal. The right ventricular  size is normal.   3. Left atrial size was mild to moderately dilated.   4. Right atrial size was mildly dilated.   5. The mitral valve is grossly normal. Mild mitral valve regurgitation.  No evidence of mitral stenosis.   6. The aortic valve is abnormal. Aortic valve regurgitation is trivial.  Moderate aortic valve stenosis.   7. The inferior vena cava is dilated in size with >50% respiratory  variability, suggesting right atrial pressure of 8 mmHg.   Event monitor 09/2019: Sinus rhythm, bursts of atrial tachycardia and atrial fibrillation. 54 to 163 bpm   Average HR 73 bpm Occasional PVC  Laboratory Data:  High Sensitivity Troponin:   Recent Labs  Lab 11-18-20 1332  TROPONINIHS >24,000*      Chemistry Recent Labs  Lab 11/18/2020 1332  NA 138  K 4.3  CL 101  CO2 13*  GLUCOSE 179*  BUN 76*  CREATININE 5.63*  CALCIUM 9.2  GFRNONAA 9*  ANIONGAP 24*    Recent Labs  Lab 18-Nov-2020 1332  PROT 6.5  ALBUMIN 3.5  AST 198*  ALT <5  ALKPHOS 70  BILITOT 1.3*   Hematology Recent Labs  Lab 18-Nov-2020 1332  WBC 17.7*  RBC 3.02*  HGB 9.8*  HCT 33.2*  MCV 109.9*  MCH 32.5  MCHC 29.5*  RDW 13.8  PLT 168   BNPNo results for input(s): BNP, PROBNP in the last 168 hours.  DDimer No results for input(s): DDIMER in the last 168 hours.   Radiology/Studies:  CT Head Wo Contrast  Result Date: Nov 18, 2020 CLINICAL DATA:  Fall. EXAM: CT HEAD WITHOUT CONTRAST TECHNIQUE: Contiguous axial images were obtained from the base of the skull through the vertex without intravenous contrast. COMPARISON:  CT head dated Aug 20, 2020. FINDINGS: Brain: New thin acute extra-axial hematoma overlying the right frontotemporal region with associated subarachnoid hemorrhage extending into the sulci and sylvian fissure. Smaller acute subdural hematoma over the anterior left frontal lobe with underlying small volume subarachnoid hemorrhage. Small volume subarachnoid hemorrhage in both anterior inferior frontal lobes. Thin acute subdural hematoma along the anterior falx. Small acute subdural hematoma overlying the posterior right parietal lobe. No significant mass effect or midline shift. No intraventricular hemorrhage or hydrocephalus. Vascular: Calcified atherosclerosis at the skullbase. No hyperdense vessel. Skull: Acute nondisplaced fracture of the right parietal bone. Sinuses/Orbits: No acute finding. Other: Small right temporoparietal scalp hematoma. IMPRESSION: 1. New small acute extra-axial hematoma overlying the right frontotemporal region with associated subarachnoid hemorrhage extending into the sulci and Sylvian fissure. Overlying acute nondisplaced fracture of the right parietal bone. No mass effect or  midline shift. 2. New small acute subdural hematoma over the anterior left frontal lobe with underlying small volume subarachnoid hemorrhage. 3. New small acute subdural hematomas along the anterior falx and overlying the posterior right parietal lobe. 4. New small volume subarachnoid hemorrhage in both anterior inferior frontal lobes. Critical Value/emergent results were called by telephone at the time of interpretation on 11-18-20 at  2:06 pm to provider Marda Stalker , who verbally acknowledged these results. Electronically Signed   By: Titus Dubin M.D.   On: 11/08/20 14:17   CT Cervical Spine Wo Contrast  Result Date: 11/08/20 CLINICAL DATA:  Fall. EXAM: CT CERVICAL SPINE WITHOUT CONTRAST TECHNIQUE: Multidetector CT imaging of the cervical spine was performed without intravenous contrast. Multiplanar CT image reconstructions were also generated. COMPARISON:  None. FINDINGS: Alignment: No traumatic malalignment. Skull base and vertebrae: No acute fracture. No primary bone lesion or focal pathologic process. Soft tissues and spinal canal: No prevertebral fluid or swelling. No visible canal hematoma. Disc levels: Unchanged multilevel disc bulging and facet uncovertebral hypertrophy. Similar moderate calcified central disc protrusion at C4-C5. Upper chest: Negative. Other: None. IMPRESSION: 1. No acute cervical spine fracture or traumatic malalignment. 2. Unchanged multilevel cervical spondylosis. Electronically Signed   By: Titus Dubin M.D.   On: 11-08-2020 14:24   DG Chest Portable 1 View  Result Date: 2020-11-08 CLINICAL DATA:  Trauma, fell and hit head, nausea EXAM: PORTABLE CHEST 1 VIEW COMPARISON:  None. FINDINGS: The cardiac silhouette is mildly enlarged. Low lung volumes with left basilar subsegmental atelectasis and elevated left hemidiaphragm. There are right mid to lower lung opacities. There is no large pleural effusion or visible pneumothorax. No acute osseous abnormality.  IMPRESSION: Low lung volumes. Right mid to lower lung opacities which could be infection/aspiration and/or atelectasis. Electronically Signed   By: Maurine Simmering   On: November 08, 2020 14:03     Assessment and Plan:   #Complete heart block: #Syncope: It seems as though complete heart block was the most likely cause of his syncopal episode today.  There was no warning.  I have related the ED staff and the PCCM attending of his episode of complete heart block today.  Pacer pads will be applied.  His blood pressure is otherwise stable so we will not start a dopamine drip.  I did discuss with him and his daughter and they would be amenable to a pacemaker if necessary.  He was living in independent living prior to this episode.  There are other things that need to be investigated first.  His TSH is markedly elevated.  We will add T3 and free T4.  He was also hypothermic, had a leukocytosis, lactate 11, procalcitonin 1.16, and possible pneumonia on chest x-ray.  Will need to be evaluated for pneumonia before considering a pacemaker.  Should he be a candidate, would consider a leadless pacemaker with the least invasive option possible.  We will have him admitted to to heart for closer monitoring.  # Subarachnoid hemorrhage: #Subdural hematoma: Conservative management per neurosurgery.  Eliquis is a course on hold and he has received Kcentra.  He will receive repeat imaging.  #PAF: Currently maintaining sinus rhythm.  We will hold his amiodarone given his complete heart block.  Holding Eliquis for intracranial bleeding:  #Acute systolic diastolic heart failure:  Echo this admission done at the bedside was reportedly concerning for acute systolic diastolic heart failure.  His LVEF was within normal limits last year.  A formal echocardiogram is pending.  He is euvolemic on exam.  Given the report of apical ballooning, would consider Takotsubo cardiomyopathy.  Not a candidate for cardiac catheterization due to age and  renal dysfunction as well as intracranial bleeding.  #Acute on chronic renal failure. Baseline creatinine is around 2 and he is up to 5.6.  He is laying flat and very comfortable.  We will give a trial of  gentle IV fluids despite his reduced systolic function.  # Elevated troponin: High-sensitivity troponin is markedly elevated over 24,000.  Likely demand ischemia in the setting of his fall, intracranial bleeding, and acute systolic and diastolic heart failure.  No plans for cath or antiplatelets as above.   Risk Assessment/Risk Scores:     TIMI Risk Score for Unstable Angina or Non-ST Elevation MI:   The patient's TIMI risk score is 3, which indicates a 13% risk of all cause mortality, new or recurrent myocardial infarction or need for urgent revascularization in the next 14 days.  New York Heart Association (NYHA) Functional Class NYHA Class I  CHA2DS2-VASc Score = 4  This indicates a 4.8% annual risk of stroke. The patient's score is based upon: CHF History: Yes HTN History: Yes Diabetes History: No Stroke History: No Vascular Disease History: No Age Score: 2 Gender Score: 0        For questions or updates, please contact [facility] Please consult [email or web address] for contact info under    Signed, Skeet Latch, MD  04-Nov-2020 7:06 PM

## 2020-10-31 NOTE — Progress Notes (Signed)
Orthopedic Tech Progress Note Patient Details:  Adam [name] 10/07/1930 [id number] Level 2 Trauma  Patient ID: Adam [name], male   DOB: 10/07/1930, 87 y.o.   MRN: [id number]  Adam [name] 11-02-2020, 1:44 PM

## 2020-10-31 NOTE — Progress Notes (Addendum)
CCM Interval Progress Note  87yo traumatic SAH on eliquis s/p Kcentra. Admitted to CCM service. Some of the remaining ED labs have resulted including CMP and trop  CMP with AKI on CKD with Cr 5.63 (baseline less than 4), AGMA AG 24 CO2 13  AST 180  Trop >24,000  Prior ECHO (08/2019) with LVED 60-65%, grade II diastolic dysfunction  Re-examined at bedside. Denies chest pain, tightness, SOB  HR continues to vary 40s-90s.   CCM MD performed limited bedside ultrasound-- 4 chamber view with reduced EF (approx 30%) and apical ballooning.   Possible cardiac stunning, but coupled with trops, LA, more concerning for possible ischemia. ECHO order changed to STAT.   Cardiology paged, awaiting callback     Eliseo Gum MSN, AGACNP-BC [place] for pager  11-01-2020, 5:14 PM

## 2020-10-31 NOTE — ED Notes (Signed)
Pt's O2 dropped to 85% , pt placed on 2L Nasal Cannula

## 2020-10-31 NOTE — ED Provider Notes (Signed)
[place] EMERGENCY DEPARTMENT Provider Note   CSN: [id number] Arrival date & time: 11-04-2020  1330     History No chief complaint on file.   Adam [name] is a 87 y.o. male.  The history is provided by the patient and the EMS personnel. The history is limited by the condition of the patient. No language interpreter was used.  Head Injury Location:  Occipital Mechanism of injury: fall   Fall:    Fall occurred:  [employer] of impact:  Head   Entrapped after fall: no   Pain details:    Severity:  No pain Chronicity:  New Relieved by:  Nothing Worsened by:  Nothing Ineffective treatments:  None tried Associated symptoms: loss of consciousness, nausea and vomiting   Associated symptoms: no blurred vision, no double vision, no headaches, no numbness and no seizures   Risk factors: being elderly       No past medical history on file.  There are no problems to display for this patient.    No family history on file.     Home Medications Prior to Admission medications   Not on File    Allergies    Patient has no allergy information on record.  Review of Systems   Review of Systems  Unable to perform ROS: Mental status change  Constitutional:  Positive for fatigue. Negative for chills and fever.  HENT:  Negative for congestion.   Eyes:  Negative for blurred vision, double vision and visual disturbance.  Respiratory:  Negative for cough, chest tightness and wheezing.   Cardiovascular:  Negative for chest pain, palpitations and leg swelling.  Gastrointestinal:  Positive for nausea and vomiting. Negative for abdominal pain, constipation and diarrhea.  Genitourinary:  Negative for dysuria.  Musculoskeletal:  Negative for back pain.  Skin:  Positive for wound.  Neurological:  Positive for loss of consciousness. Negative for dizziness, seizures, light-headedness, numbness and headaches.  Psychiatric/Behavioral:  Negative for agitation and  confusion (resolved now).   All other systems reviewed and are negative.  Physical Exam Updated Vital Signs BP 105/65   Pulse 87   Resp 19   Wt 64 kg   SpO2 98%   Physical Exam Vitals and nursing note reviewed.  Constitutional:      General: He is not in acute distress.    Appearance: He is well-developed. He is not ill-appearing, toxic-appearing or diaphoretic.  HENT:     Head:     Comments: Hemostatic skin tears to back of scalp.  No laceration seen requiring intervention at this time.  Mild tenderness..    Nose: No congestion.     Mouth/Throat:     Mouth: Mucous membranes are moist.     Pharynx: No oropharyngeal exudate or posterior oropharyngeal erythema.  Eyes:     Extraocular Movements: Extraocular movements intact.     Conjunctiva/sclera: Conjunctivae normal.     Pupils: Pupils are equal, round, and reactive to light.  Cardiovascular:     Rate and Rhythm: Regular rhythm. Bradycardia present.     Heart sounds: Murmur heard.  Pulmonary:     Effort: Pulmonary effort is normal. No respiratory distress.     Breath sounds: Normal breath sounds. No wheezing, rhonchi or rales.  Chest:     Chest wall: No tenderness.  Abdominal:     Palpations: Abdomen is soft.     Tenderness: There is no abdominal tenderness. There is no right CVA tenderness, left CVA tenderness, guarding or  rebound.  Musculoskeletal:        General: No tenderness.     Cervical back: Neck supple. No tenderness.     Left lower leg: No edema.  Skin:    General: Skin is warm and dry.     Capillary Refill: Capillary refill takes less than 2 seconds.     Findings: No erythema.  Neurological:     Mental Status: He is alert and oriented to person, place, and time.     Sensory: No sensory deficit.     Motor: No weakness.    ED Results / Procedures / Treatments   Labs (all labs ordered are listed, but only abnormal results are displayed) Labs Reviewed  CBC WITH DIFFERENTIAL/PLATELET - Abnormal; Notable  for the following components:      Result Value   WBC 17.7 (*)    RBC 3.02 (*)    Hemoglobin 9.8 (*)    HCT 33.2 (*)    MCV 109.9 (*)    MCHC 29.5 (*)    Neutro Abs 14.6 (*)    Monocytes Absolute 1.9 (*)    Abs Immature Granulocytes 0.09 (*)    All other components within normal limits  LACTIC ACID, PLASMA - Abnormal; Notable for the following components:   Lactic Acid, Venous >11 (*)    All other components within normal limits  TSH - Abnormal; Notable for the following components:   TSH 17.283 (*)    All other components within normal limits  URINE CULTURE  RESP PANEL BY RT-PCR (FLU A&B, COVID) ARPGX2  COMPREHENSIVE METABOLIC PANEL  LIPASE, BLOOD  LACTIC ACID, PLASMA  MAGNESIUM  URINALYSIS, ROUTINE W REFLEX MICROSCOPIC  CK  PROCALCITONIN  TROPONIN I (HIGH SENSITIVITY)  TROPONIN I (HIGH SENSITIVITY)    EKG EKG Interpretation  Date/Time:  11-04-20 13:46:47 EDT Ventricular Rate:  47 PR Interval:  224 QRS Duration: 185 QT Interval:  594 QTC Calculation: 526 R Axis:   266 Text Interpretation: Sinus bradycardia Borderline prolonged PR interval RBBB and LAFB No prior ECG for comparson. sinus bradycardia. No STEMI Confirmed by Antony Blackbird [phone]) on 04-Nov-2020 1:49:27 PM  Radiology CT Head Wo Contrast  Result Date: 2020-11-04 CLINICAL DATA:  Fall. EXAM: CT HEAD WITHOUT CONTRAST TECHNIQUE: Contiguous axial images were obtained from the base of the skull through the vertex without intravenous contrast. COMPARISON:  CT head dated Aug 20, 2020. FINDINGS: Brain: New thin acute extra-axial hematoma overlying the right frontotemporal region with associated subarachnoid hemorrhage extending into the sulci and sylvian fissure. Smaller acute subdural hematoma over the anterior left frontal lobe with underlying small volume subarachnoid hemorrhage. Small volume subarachnoid hemorrhage in both anterior inferior frontal lobes. Thin acute subdural hematoma along the anterior  falx. Small acute subdural hematoma overlying the posterior right parietal lobe. No significant mass effect or midline shift. No intraventricular hemorrhage or hydrocephalus. Vascular: Calcified atherosclerosis at the skullbase. No hyperdense vessel. Skull: Acute nondisplaced fracture of the right parietal bone. Sinuses/Orbits: No acute finding. Other: Small right temporoparietal scalp hematoma. IMPRESSION: 1. New small acute extra-axial hematoma overlying the right frontotemporal region with associated subarachnoid hemorrhage extending into the sulci and Sylvian fissure. Overlying acute nondisplaced fracture of the right parietal bone. No mass effect or midline shift. 2. New small acute subdural hematoma over the anterior left frontal lobe with underlying small volume subarachnoid hemorrhage. 3. New small acute subdural hematomas along the anterior falx and overlying the posterior right parietal lobe. 4. New small volume subarachnoid hemorrhage  in both anterior inferior frontal lobes. Critical Value/emergent results were called by telephone at the time of interpretation on 06-Nov-2020 at 2:06 pm to provider [facility] , who verbally acknowledged these results. Electronically Signed   By: Titus Dubin M.D.   On: 06-Nov-2020 14:17   CT Cervical Spine Wo Contrast  Result Date: 11-06-2020 CLINICAL DATA:  Fall. EXAM: CT CERVICAL SPINE WITHOUT CONTRAST TECHNIQUE: Multidetector CT imaging of the cervical spine was performed without intravenous contrast. Multiplanar CT image reconstructions were also generated. COMPARISON:  None. FINDINGS: Alignment: No traumatic malalignment. Skull base and vertebrae: No acute fracture. No primary bone lesion or focal pathologic process. Soft tissues and spinal canal: No prevertebral fluid or swelling. No visible canal hematoma. Disc levels: Unchanged multilevel disc bulging and facet uncovertebral hypertrophy. Similar moderate calcified central disc protrusion at C4-C5.  Upper chest: Negative. Other: None. IMPRESSION: 1. No acute cervical spine fracture or traumatic malalignment. 2. Unchanged multilevel cervical spondylosis. Electronically Signed   By: Titus Dubin M.D.   On: Nov 06, 2020 14:24   DG Chest Portable 1 View  Result Date: 2020-11-06 CLINICAL DATA:  Trauma, fell and hit head, nausea EXAM: PORTABLE CHEST 1 VIEW COMPARISON:  None. FINDINGS: The cardiac silhouette is mildly enlarged. Low lung volumes with left basilar subsegmental atelectasis and elevated left hemidiaphragm. There are right mid to lower lung opacities. There is no large pleural effusion or visible pneumothorax. No acute osseous abnormality. IMPRESSION: Low lung volumes. Right mid to lower lung opacities which could be infection/aspiration and/or atelectasis. Electronically Signed   By: Maurine Simmering   On: 11/06/20 14:03    Procedures Procedures   CRITICAL CARE Performed by: Gwenyth Allegra Kiylah Loyer Total critical care time: 40 minutes Critical care time was exclusive of separately billable procedures and treating other patients. Critical care was necessary to treat or prevent imminent or life-threatening deterioration. Critical care was time spent personally by me on the following activities: development of treatment plan with patient and/or surrogate as well as nursing, discussions with consultants, evaluation of patient's response to treatment, examination of patient, obtaining history from patient or surrogate, ordering and performing treatments and interventions, ordering and review of laboratory studies, ordering and review of radiographic studies, pulse oximetry and re-evaluation of patient's condition.  Medications Ordered in ED Medications  levETIRAcetam (KEPPRA) IVPB 500 mg/100 mL premix (has no administration in time range)  Tdap (BOOSTRIX) injection 0.5 mL (0.5 mLs Intramuscular Given 2020/11/06 1422)  ondansetron (ZOFRAN) injection 4 mg (4 mg Intravenous Given 06-Nov-2020 1412)   prothrombin complex conc human (KCENTRA) IVPB 3,317 Units (0 Units Intravenous Stopped 11-06-20 1538)    ED Course  I have reviewed the triage vital signs and the nursing notes.  Pertinent labs & imaging results that were available during my care of the patient were reviewed by me and considered in my medical decision making (see chart for details).    MDM Rules/Calculators/A&P                           Easton Fetty is a 87 y.o. male with an unknown past medical history but reportedly on Eliquis therapy who presents as a level 2 trauma for head injury due to fall/syncope.  According to EMS, patient was last normal at 10 AM this morning but then was found down on the ground bleeding from his head.  Initially was unresponsive but then started to wake up.  Patient placed in a cervical immobilization collar and  brought in for evaluation.  Patient has matted blood on the back of his head but EMS felt this was more of a skin tear than laceration but we will evaluate.  According to EMS, patient has been bradycardic with rates in the 30s and 40s but was denying symptoms aside from nausea and vomiting.  Patient says that he has been feeling well recently and denies any recent head injuries before today.  He denies any chest pain, shortness breath, or palpitations.  He denies any medication changes.  He was alert and oriented x2 per EMS but now is alert and oriented x4.  He is denying any vision changes, numbness, tingling, weakness of extremities.  He does not member what happened and just remembers standing in his room and next thing waking up on the floor.  Suspect syncope.  He denies any recent urinary changes, cough, fevers, chills, or COVID symptoms.  On exam, lungs are clear and chest is nontender.  Abdomen is nontender.  Hips nontender.  Patient has some abrasions on his arms and has matted blood on the back of his head near the cervical immobilization collar.  Patient moving all extremities.   Normal sensation and strength throughout.  Symmetric smile.  Pupils are symmetric and reactive normal extraocular movements.  Clinically I am concerned given the obvious head injury, his nausea, vomiting, and the blood thinner use.  We will get CT head and neck and we will update his tetanus as I cannot find this in the chart.  We will get other labs to look for etiology of syncope although with his bradycardia going into the 30s I am concerned about a possible cardiogenic syncope.  Anticipate reassessment after work-up to determine disposition.  I went in with the patient to the CT scanner and was able to see the initial images being pulled up and it does show concern for intracranial hemorrhage.  I quickly called radiology to confirm patient does have 2 areas of traumatic subarachnoid, small areas of subdural, and skull fracture.  As he is on Eliquis and reports taking it today, with his nausea, vomiting, and bradycardia, we will order reversal medications with pharmacy.  Awaiting consultation to neurosurgery to discuss further management.  Spoke to Dr. Trenton Gammon who recommends reversal and admission to critical care for further monitoring management.  They will see the patient.  They feel that critical care is better to primarily manage given the changes in heart rate from 30s to 90s and a syncope side of things.  2:56 PM Just spoke to critical care who will see for admission.   I reassessed the patient's scalp there was a skin tear but no evidence of laceration requiring sutures.  It is hemostatic now.  Will defer further management to the ICU team who is seeing patient for admission.  I discussed with them that he does have a white count, lactic acid elevation, and had a low temperature on arrival and will let them decide to provide antibiotics if they can find a source.  Also discussed with critical care that seizure could have caused some of the lactic acidosis as well.  Will defer further  management.   Final Clinical Impression(s) / ED Diagnoses Final diagnoses:  Bradycardia  Syncope, unspecified syncope type  Open fracture of skull, unspecified bone, initial encounter ([place])  Subarachnoid bleed (HCC)  Subdural hematoma (HCC)   Clinical Impression: 1. Bradycardia   2. Syncope, unspecified syncope type   3. Open fracture of skull, unspecified bone, initial  encounter ([place])   4. Subarachnoid bleed (HCC)   5. Subdural hematoma (HCC)     Disposition: Admit  This note was prepared with assistance of Dragon voice recognition software. Occasional wrong-word or sound-a-like substitutions may have occurred due to the inherent limitations of voice recognition software.     Frantz Quattrone, Gwenyth Allegra, MD November 16, 2020 [phone]

## 2020-10-31 NOTE — Progress Notes (Signed)
This chaplain responded to Level 2 Trauma in Rm 3.    The Pt. Is awake and responding to the the medical team's questions.  The chaplain understands the Pt. fell at assisted living center.  The chaplain understands no family to contact at this time.  F/U spiritual care is available as needed.

## 2020-10-31 NOTE — Progress Notes (Signed)
Settling patient during admission onto the unit, patient was connected to the monitor, was talking and then went into asystole with no pulse. Pt is a DNR, Dr. Oletta Darter on the camera in the room. Family brought to the bedside.   TOD confirmed with Normajean Baxter, RN @1941 .

## 2020-10-31 DEATH — deceased

## 2020-11-08 ENCOUNTER — Encounter (INDEPENDENT_AMBULATORY_CARE_PROVIDER_SITE_OTHER): Payer: Medicare Other | Admitting: Ophthalmology

## 2020-11-14 ENCOUNTER — Encounter (INDEPENDENT_AMBULATORY_CARE_PROVIDER_SITE_OTHER): Payer: Medicare Other | Admitting: Ophthalmology

## 2020-11-16 ENCOUNTER — Encounter (INDEPENDENT_AMBULATORY_CARE_PROVIDER_SITE_OTHER): Payer: Self-pay

## 2020-11-16 ENCOUNTER — Encounter (INDEPENDENT_AMBULATORY_CARE_PROVIDER_SITE_OTHER): Payer: Medicare Other | Admitting: Ophthalmology

## 2021-02-15 IMAGING — DX DG CHEST 1V PORT
1 series · 1 of 1 positions shown · non-contrast
Comparison: August 06, 2019

CLINICAL DATA: Pneumonia

EXAM:
PORTABLE CHEST 1 VIEW

[chest ap]
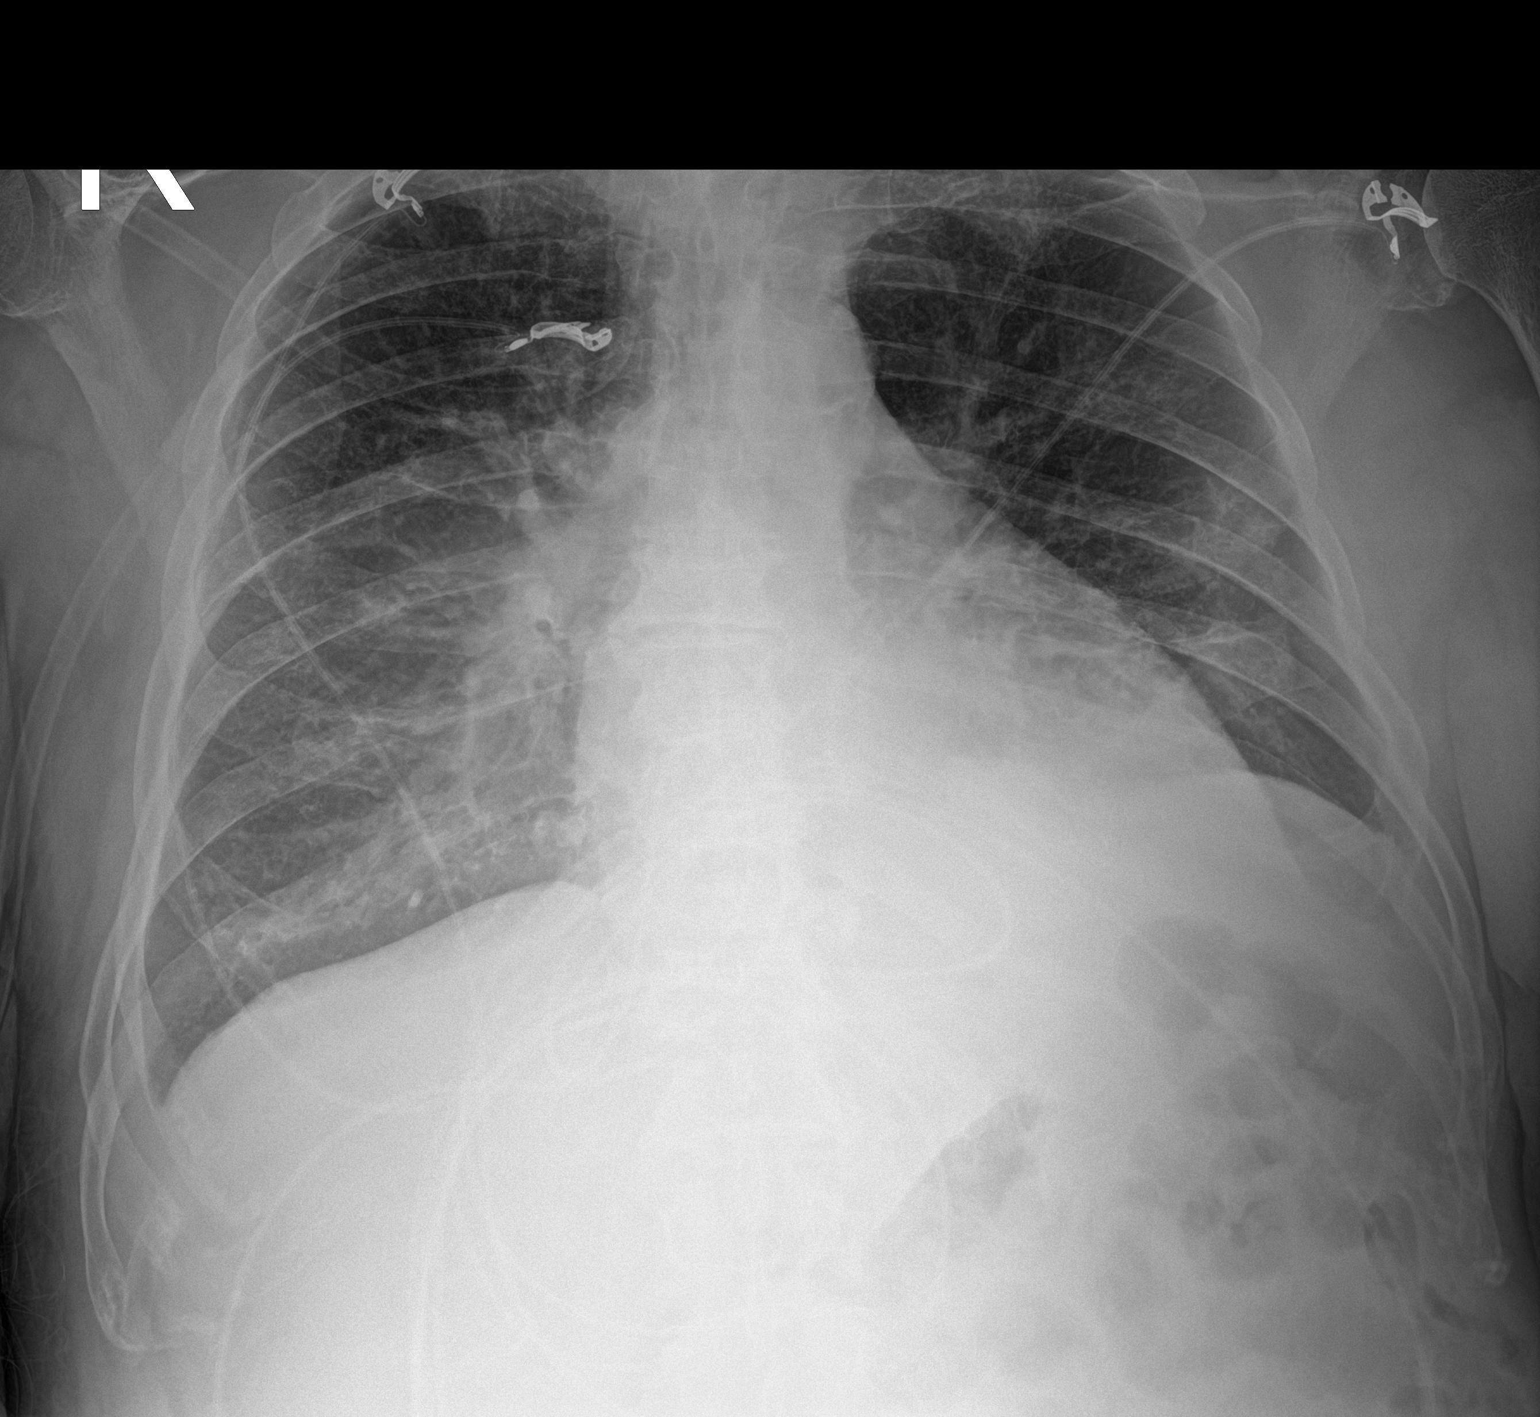

[1 of 1 positions shown; findings below may reference images not displayed]

FINDINGS: There is a new area of apparent infiltrate in the left mid lung
peripherally. In comparison with 3 days prior, there is less
ill-defined opacity in the right perihilar region currently. Slight
ill-defined opacity remains in the right base and right perihilar
regions. There is atelectatic change in the medial left base. Heart
is borderline enlarged with pulmonary vascularity normal. No
adenopathy. There is aortic atherosclerosis. No bone lesions.
IMPRESSION: New area of apparent pneumonia left mid lung. Somewhat less opacity
in the right perihilar and right base regions consistent with
partial but incomplete clearing of infiltrate from these areas.
There is medial left base atelectasis. Stable cardiac prominence.
